# Patient Record
Sex: Female | Born: 1950 | Race: White | Hispanic: No | Smoking: Current every day smoker
Health system: Southern US, Community
[De-identification: ages and names within clinical notes are randomized; demographics above are authoritative.]

## PROBLEM LIST (undated history)

## (undated) DIAGNOSIS — R7303 Prediabetes: Secondary | ICD-10-CM

## (undated) DIAGNOSIS — T4145XA Adverse effect of unspecified anesthetic, initial encounter: Secondary | ICD-10-CM

## (undated) DIAGNOSIS — T8859XA Other complications of anesthesia, initial encounter: Secondary | ICD-10-CM

## (undated) DIAGNOSIS — Z8489 Family history of other specified conditions: Secondary | ICD-10-CM

## (undated) DIAGNOSIS — I10 Essential (primary) hypertension: Secondary | ICD-10-CM

## (undated) DIAGNOSIS — K5792 Diverticulitis of intestine, part unspecified, without perforation or abscess without bleeding: Secondary | ICD-10-CM

## (undated) DIAGNOSIS — M1990 Unspecified osteoarthritis, unspecified site: Secondary | ICD-10-CM

## (undated) DIAGNOSIS — E7800 Pure hypercholesterolemia, unspecified: Secondary | ICD-10-CM

## (undated) DIAGNOSIS — M4802 Spinal stenosis, cervical region: Secondary | ICD-10-CM

## (undated) HISTORY — PX: TUMOR REMOVAL: SHX12

---

## 1990-08-16 HISTORY — PX: BACK SURGERY: SHX140

## 1990-08-16 HISTORY — PX: THORACIC FUSION: SHX1062

## 1993-08-16 HISTORY — PX: ABDOMINAL HYSTERECTOMY: SHX81

## 1998-10-29 ENCOUNTER — Other Ambulatory Visit: Admission: RE | Admit: 1998-10-29 | Discharge: 1998-10-29 | Payer: Self-pay | Admitting: Obstetrics and Gynecology

## 1999-11-25 ENCOUNTER — Other Ambulatory Visit: Admission: RE | Admit: 1999-11-25 | Discharge: 1999-11-25 | Payer: Self-pay | Admitting: Obstetrics and Gynecology

## 2000-02-16 ENCOUNTER — Encounter: Admission: RE | Admit: 2000-02-16 | Discharge: 2000-02-16 | Payer: Self-pay | Admitting: Family Medicine

## 2000-02-16 ENCOUNTER — Encounter: Payer: Self-pay | Admitting: Family Medicine

## 2000-06-08 ENCOUNTER — Encounter (INDEPENDENT_AMBULATORY_CARE_PROVIDER_SITE_OTHER): Payer: Self-pay | Admitting: *Deleted

## 2000-06-08 ENCOUNTER — Emergency Department (HOSPITAL_COMMUNITY): Admission: EM | Admit: 2000-06-08 | Discharge: 2000-06-08 | Payer: Self-pay | Admitting: Emergency Medicine

## 2000-06-08 ENCOUNTER — Encounter: Payer: Self-pay | Admitting: Emergency Medicine

## 2000-06-08 ENCOUNTER — Encounter: Payer: Self-pay | Admitting: General Surgery

## 2000-06-08 ENCOUNTER — Inpatient Hospital Stay (HOSPITAL_COMMUNITY): Admission: EM | Admit: 2000-06-08 | Discharge: 2000-06-11 | Payer: Self-pay | Admitting: Emergency Medicine

## 2000-06-20 ENCOUNTER — Ambulatory Visit (HOSPITAL_COMMUNITY): Admission: RE | Admit: 2000-06-20 | Discharge: 2000-06-20 | Payer: Self-pay | Admitting: General Surgery

## 2000-06-20 ENCOUNTER — Encounter: Payer: Self-pay | Admitting: General Surgery

## 2000-06-20 ENCOUNTER — Encounter (INDEPENDENT_AMBULATORY_CARE_PROVIDER_SITE_OTHER): Payer: Self-pay | Admitting: *Deleted

## 2000-07-06 ENCOUNTER — Encounter (INDEPENDENT_AMBULATORY_CARE_PROVIDER_SITE_OTHER): Payer: Self-pay | Admitting: Specialist

## 2000-07-06 ENCOUNTER — Inpatient Hospital Stay (HOSPITAL_COMMUNITY): Admission: RE | Admit: 2000-07-06 | Discharge: 2000-07-14 | Payer: Self-pay | Admitting: General Surgery

## 2000-07-28 ENCOUNTER — Encounter: Admission: RE | Admit: 2000-07-28 | Discharge: 2000-10-26 | Payer: Self-pay | Admitting: Radiation Oncology

## 2000-10-26 ENCOUNTER — Encounter: Admission: RE | Admit: 2000-10-26 | Discharge: 2000-10-26 | Payer: Self-pay | Admitting: Obstetrics and Gynecology

## 2000-10-26 ENCOUNTER — Encounter: Payer: Self-pay | Admitting: Obstetrics and Gynecology

## 2001-06-14 ENCOUNTER — Emergency Department (HOSPITAL_COMMUNITY): Admission: EM | Admit: 2001-06-14 | Discharge: 2001-06-14 | Payer: Self-pay | Admitting: Emergency Medicine

## 2001-06-14 ENCOUNTER — Encounter: Payer: Self-pay | Admitting: Emergency Medicine

## 2001-06-15 ENCOUNTER — Encounter (INDEPENDENT_AMBULATORY_CARE_PROVIDER_SITE_OTHER): Payer: Self-pay | Admitting: Specialist

## 2001-06-15 ENCOUNTER — Inpatient Hospital Stay (HOSPITAL_COMMUNITY): Admission: EM | Admit: 2001-06-15 | Discharge: 2001-06-21 | Payer: Self-pay | Admitting: Family Medicine

## 2001-06-15 ENCOUNTER — Encounter: Payer: Self-pay | Admitting: Family Medicine

## 2001-08-04 ENCOUNTER — Ambulatory Visit (HOSPITAL_COMMUNITY): Admission: RE | Admit: 2001-08-04 | Discharge: 2001-08-04 | Payer: Self-pay | Admitting: *Deleted

## 2001-10-13 ENCOUNTER — Other Ambulatory Visit: Admission: RE | Admit: 2001-10-13 | Discharge: 2001-10-13 | Payer: Self-pay | Admitting: Obstetrics and Gynecology

## 2002-08-16 HISTORY — PX: COLECTOMY: SHX59

## 2003-01-10 ENCOUNTER — Encounter: Admission: RE | Admit: 2003-01-10 | Discharge: 2003-01-10 | Payer: Self-pay

## 2003-05-02 ENCOUNTER — Emergency Department (HOSPITAL_COMMUNITY): Admission: EM | Admit: 2003-05-02 | Discharge: 2003-05-03 | Payer: Self-pay | Admitting: Emergency Medicine

## 2003-05-03 ENCOUNTER — Inpatient Hospital Stay (HOSPITAL_COMMUNITY): Admission: EM | Admit: 2003-05-03 | Discharge: 2003-05-05 | Payer: Self-pay | Admitting: Family Medicine

## 2003-05-03 ENCOUNTER — Encounter: Payer: Self-pay | Admitting: Emergency Medicine

## 2003-06-03 ENCOUNTER — Inpatient Hospital Stay (HOSPITAL_COMMUNITY): Admission: EM | Admit: 2003-06-03 | Discharge: 2003-06-20 | Payer: Self-pay | Admitting: Emergency Medicine

## 2003-06-03 ENCOUNTER — Encounter: Payer: Self-pay | Admitting: Hematology and Oncology

## 2003-06-05 ENCOUNTER — Encounter: Payer: Self-pay | Admitting: Hematology and Oncology

## 2003-06-06 ENCOUNTER — Encounter: Payer: Self-pay | Admitting: Hematology and Oncology

## 2003-06-06 ENCOUNTER — Encounter (INDEPENDENT_AMBULATORY_CARE_PROVIDER_SITE_OTHER): Payer: Self-pay | Admitting: Specialist

## 2003-06-25 ENCOUNTER — Ambulatory Visit (HOSPITAL_COMMUNITY): Admission: RE | Admit: 2003-06-25 | Discharge: 2003-06-25 | Payer: Self-pay | Admitting: General Surgery

## 2003-07-24 ENCOUNTER — Encounter: Admission: RE | Admit: 2003-07-24 | Discharge: 2003-07-24 | Payer: Self-pay | Admitting: Infectious Diseases

## 2003-07-31 ENCOUNTER — Encounter: Admission: RE | Admit: 2003-07-31 | Discharge: 2003-07-31 | Payer: Self-pay | Admitting: Infectious Diseases

## 2003-08-01 ENCOUNTER — Encounter: Admission: RE | Admit: 2003-08-01 | Discharge: 2003-08-01 | Payer: Self-pay | Admitting: Infectious Diseases

## 2003-09-26 ENCOUNTER — Encounter: Admission: RE | Admit: 2003-09-26 | Discharge: 2003-09-26 | Payer: Self-pay | Admitting: Family Medicine

## 2003-10-19 ENCOUNTER — Encounter: Admission: RE | Admit: 2003-10-19 | Discharge: 2003-10-19 | Payer: Self-pay | Admitting: Family Medicine

## 2005-05-05 ENCOUNTER — Encounter: Admission: RE | Admit: 2005-05-05 | Discharge: 2005-05-05 | Payer: Self-pay | Admitting: Family Medicine

## 2005-05-17 ENCOUNTER — Encounter: Admission: RE | Admit: 2005-05-17 | Discharge: 2005-05-17 | Payer: Self-pay | Admitting: Family Medicine

## 2005-05-27 ENCOUNTER — Encounter: Admission: RE | Admit: 2005-05-27 | Discharge: 2005-05-27 | Payer: Self-pay | Admitting: Family Medicine

## 2005-11-19 ENCOUNTER — Emergency Department (HOSPITAL_COMMUNITY): Admission: EM | Admit: 2005-11-19 | Discharge: 2005-11-19 | Payer: Self-pay | Admitting: Emergency Medicine

## 2005-12-03 ENCOUNTER — Emergency Department (HOSPITAL_COMMUNITY): Admission: EM | Admit: 2005-12-03 | Discharge: 2005-12-03 | Payer: Self-pay | Admitting: Emergency Medicine

## 2006-06-07 ENCOUNTER — Encounter: Admission: RE | Admit: 2006-06-07 | Discharge: 2006-06-07 | Payer: Self-pay | Admitting: Family Medicine

## 2006-10-31 ENCOUNTER — Encounter: Admission: RE | Admit: 2006-10-31 | Discharge: 2006-10-31 | Payer: Self-pay | Admitting: Neurosurgery

## 2007-09-04 ENCOUNTER — Encounter: Admission: RE | Admit: 2007-09-04 | Discharge: 2007-09-04 | Payer: Self-pay | Admitting: Gastroenterology

## 2008-06-11 ENCOUNTER — Encounter: Admission: RE | Admit: 2008-06-11 | Discharge: 2008-06-11 | Payer: Self-pay | Admitting: Family Medicine

## 2010-09-05 ENCOUNTER — Encounter: Payer: Self-pay | Admitting: Family Medicine

## 2010-09-05 ENCOUNTER — Encounter: Payer: Self-pay | Admitting: General Surgery

## 2010-09-06 ENCOUNTER — Encounter: Payer: Self-pay | Admitting: Family Medicine

## 2011-01-01 NOTE — H&P (Signed)
[place]  Patient:    [name], [name]                      MRN: [id number] Adm. Date:  [id number] Disc. Date: [id number] Attending:  Benny Lennert                         History and Physical  CHIEF COMPLAINT: [name] is a 60 year old white female who for the last two weeks has been having a burning sensation throughout her body.  HISTORY OF PRESENT ILLNESS: She reports intermittently having some abdominal cramps but currently has absolutely no abdominal pain.  The burning sensation she feels she is not able to describe.  Whenever she has taken her temperature it has been normal.  She denies any nausea or vomiting.  She says her appetite is poor although she has not really lost any weight recently.  Her bowels have moved normally.  She denies any diarrhea or dysuria, constipation.  She overall only really complains of burning sensation in her blood.  She also says that this has been going on for 20 years and that she has a raging infection in her abdomen.  REVIEW OF SYSTEMS: Her other Review Of Systems is fairly unremarkable.  PAST MEDICAL HISTORY: Fairly unremarkable.  PAST SURGICAL HISTORY: Hysterectomy and bilateral salpingo-oophorectomy for what she calls infection.  CURRENT MEDICATIONS: Premarin 0.625 mg q.d.  ALLERGIES: No known drug allergies.  FAMILY HISTORY: Noncontributory.  SOCIAL HISTORY: She smokes about a packs of cigarettes a day and denies any alcohol use.  She does live here in [place], [place].  PHYSICAL EXAMINATION:  GENERAL: She is an obese white female, intermittently moaning and intermittently lying comfortably in bed.  SKIN: Warm and dry.  HEENT: EOMI.  PERRL.  Sclerae nonicteric.  NECK: No bruits.  LUNGS: Clear to auscultation bilaterally.  HEART: Regular rate and rhythm.  ABDOMEN: Soft, nontender, nondistended.  She is obese.  EXTREMITIES: No clubbing, cyanosis, or edema.  NEUROLOGIC:  Alert and oriented x 4.  Hematologically I can palpate no lymphadenopathy.  LABORATORY DATA: On recent laboratory work her WBC was noted to be 17,000 with a normal Differential.  The rest of her blood work was within normal limits.  Recent CT scan obtained shows an indistinct mass-type area which is difficult to tell whether it is in her mesentery of her small bowel or whether it involves her small bowel or whether it involves her colon.  Contrast made it through her colon and none of her bowel loops appear to be distended or obstructed in any way.  There is no free fluid in the abdomen and no evidence of perforation.  ASSESSMENT/PLAN: This patient is a 60 year old white female with vague symptoms of burning within her, who has an abnormality on her CT scan which is difficult to tell whether involves her small bowel or colon, but no evidence of perforation.  We will admit her to the hospital and repeat her blood work. We will also check her urine for 5-HIAA levels to try to help distinguish whether this mass if a carcinoid.  We will also check a TSH level on her.  We will also consult gastroenterology for possible colonoscopy to see if this area is involving her colon. DD:  06/08/00 TD:  06/09/00 Job: [id number]

## 2011-01-01 NOTE — Discharge Summary (Signed)
[name], [name]                         ACCOUNT NO.:  [id number]   MEDICAL RECORD NO.:  [id number]                   PATIENT TYPE:  INP   LOCATION:  5019                                 FACILITY:  [facility]   PHYSICIAN:  Jimmye Norman III, M.D.               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   ADDITIONAL DIAGNOSIS:  Subdiaphragmatic abscess, which was percutaneously  drained and the patient was sent home with the drain in place.   PRINCIPAL PROCEDURE:  Distal pancreatectomy along with a left colectomy  because of adherent pancreatic tissue, fibrosis, and scarring.   SURGEON:  Jimmye Norman, M.D.   ASSISTANT:  Anselm Pancoast. Zachery Dakins, M.D.   DISCHARGE MEDICATIONS:  1. Vicodin 1-2 to take as needed for pain.  2. Ciprofloxacin.  3. Flagyl to control further infection.   HOSPITAL COURSE:  The patient was admitted through the ED with a diagnosis  of acute diverticulitis based on previous studies and also a CT scan, which,  however, did not show the severe inflammation as noted previously. She had  been plagued with abdominal pain and was being worked up as an outpatient  for a colectomy by a partner in our group. We admitted the patient and  agreed to go ahead with surgery for diverticulitis, but instead found that  the patient had a peripancreatic inflammatory mass associated with the tail  of the pancreas and the left transverse colon and splenic flexure. She was  able to undergo a left colectomy combined with a distal pancreatectomy with  the drain being left in place. In spite of that, the patient did develop a  subdiaphragmatic abscess, which was drained percutaneously and she went  home. She was  controlled with p.o. antibiotics and although she had some  discomfort with deep inspiration she was able to be discharged to home. She  was kept on Cipro and Flagyl to be kept until after her drain was removed  and cleared by CT scan.                                                Kathrin Ruddy, M.D.    JW/MEDQ  D:  08/21/2003  T:  08/22/2003  Job:  [id number]

## 2011-01-01 NOTE — Consult Note (Signed)
[name], [name]                         ACCOUNT NO.:  [id number]   MEDICAL RECORD NO.:  [id number]                   PATIENT TYPE:  INP   LOCATION:  0476                                 FACILITY:  [facility]   PHYSICIAN:  Currie Paris, M.D.           DATE OF BIRTH:  02-23-1951   DATE OF CONSULTATION:  05/03/2003  DATE OF DISCHARGE:                                   CONSULTATION   REASON FOR CONSULTATION:  Diverticulitis.   CLINICAL HISTORY:  This patient is a 60 year old lady admitted with a  clinical diagnosis of acute diverticulitis.   Her history really dates back to 2001 when she presented with a mass in the  abdomen, was operated on, and that was found to be a mesenteric mass  associated with SMA and the majority of the mass has been resected along  with some of her proximal jejunum.  The pathology showed a mesenteric  fibromatosis.   She was subsequently admitted in October 2002 with an episode of  diverticulitis which responded to antibiotic therapy.  She said she had a  similar episode of abdominal problems in 2003 but was not hospitalized.  She  developed another episode this past August, was treated as an outpatient  with antibiotics, got better, went off the antibiotics.  About three days  ago she developed again some abdominal discomfort.  She does not really say  that this is pain.  Says she does not really have pain but just feels  nauseated and has a funny feeling in her abdomen and discomfort in the left  lower part of the abdomen.  She also has some mild nausea.   The patient does note that ever since her initial surgery in 2001 she has  had different feelings in her abdomen and really has never felt quite  normal.  CT done a few weeks ago apparently.  I do not have that report.  It  did show diverticulitis, but one done last night in the emergency room  showed some thickening of the sigmoid colon.  Because she has continued  symptomatic today she was  admitted.   PAST MEDICAL HISTORY:  She has also had a hysterectomy in addition to the  prior fibromatosis.  She has had a known history of hypertension,  hyperlipidemia.  She has had some peptic ulcer disease and reflux, on  Protonix.  She has had colon polyps noted as well, and actually had a follow-  up appointment with Dr. Anselmo Rod next week.  She has also been  diagnosed with a general anxiety disorder.  She does smoke about a pack a  day.  Does not drink alcohol.   FAMILY HISTORY:  Positive for some coronary artery disease.   PHYSICAL EXAM:  GENERAL:  Alert and oriented and uncomfortable-appearing.  VITAL SIGNS:  Stable since admission, and she has had no fever.  HEENT:  Head is normocephalic.  Eyes nonicteric.  Pupils equal, round, and  regular.  Pharynx, mucous membranes are moist.  NECK:  Supple.  No masses or thyromegaly.  LUNGS:  Clear to auscultation.  No rales or rhonchi noted.  HEART:  Regular rhythm.  No murmurs, rubs, or gallops.  Good pulses carotid,  dorsalis pedis.  ABDOMEN:  Long midline scar well-healed.  No hernias obvious.  Mildly tender  deep to the left lower quadrant.  No rebound.  No masses.  No organomegaly.  Bowel sounds are present.  EXTREMITIES:  No cyanosis or edema.   LABORATORY DATA:  Data reviewed.  Laboratories showed hemoglobin 15.8, white  count of 11,000.  There is no significant left shift.  Electrolytes are  normal.  Nonfasting glucose is 101.  Total protein is slightly high at 8.6.  Urinalysis is basically negative with specific gravity _________.   CT noted, and there is no evidence of any recurrent abdominal mass.  She  does have thickening of the sigmoid colon.   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   PLAN:  The patient is already on IV antibiotics which I think is  appropriate.  Assuming this resolves at some point, elective sigmoid  colectomy will need to be discussed.  She may need  further GI workup by Dr.  Elsie Amis prior to that being done and possibly barium enema to assess the  extent of diverticular disease and to determine if this is diverticulitis  and not some other inflammatory colonic process.                                               Currie Paris, M.D.    CJS/MEDQ  D:  05/03/2003  T:  05/04/2003  Job:  [id number]

## 2011-01-01 NOTE — Consult Note (Signed)
NAMESWEETIE, GIEBLER                         ACCOUNT NO.:  [id number]   MEDICAL RECORD NO.:  [id number]                   PATIENT TYPE:  INP   LOCATION:  5019                                 FACILITY:  [facility]   PHYSICIAN:  Jimmye Norman III, M.D.               DATE OF BIRTH:  [birth year]   DATE OF CONSULTATION:  06/03/2003  DATE OF DISCHARGE:                                   CONSULTATION   Dear Dr. Mal [name],   Thank you very much for asking me to see [name], a patient previously  followed by my partner in surgery, Dr. Chevis [name], for diverticular disease,  who has had multiple admissions in the last month for abdominal pain.  She  is being admitted for recurrent abdominal pain now and apparent recurrent  diverticulitis.   The patient has had hospitalizations at both hospitals recently for this  abdominal pain with a CT scan demonstrating the presence of likely acute  diverticulitis.  She has an interesting past history of a small bowel  mesenteric tumor which Dr. Carolynne [name] has resected in the past.  However this  current process has been on and off for the past month.  She has had  multiple admissions and now she is coming in with an increased white count,  four days of abdominal pain after failing oral antibiotics for her problem.  She has had nausea and vomiting and apparent fevers and chills with  intermittent pain.   PAST MEDICAL HISTORY:  1. Significant for the previous small bowel mesenteric tumor removed by Dr.     Carolynne [name] in 2001.  2. She has had a total abdominal hysterectomy.  3. She had back surgery in 1992 and a fusion.   CURRENT MEDICATIONS:  1. Xanax.  2. Hydrochlorothiazide.  3. Protonix.  4. Cipro and Flagyl which she was taking for diverticulitis.   ALLERGIES:  She is intolerant to CODEINE although it is not a significant  allergy (she does not take it very well).   SOCIAL HISTORY:  She is a smoker of a pack a day for over 30 years.  She is  married and has a  son. Her sister is with her at the hospital.   REVIEW OF SYSTEMS:  She has a history of anxiety.  Her bowel movements have  been mushy but not bloody or diarrhea frankly.  She has had nausea but no  vomiting recently.  No chest pain or cardiac discomfort.   PHYSICAL EXAMINATION:  GENERAL:  On examination in the emergency room she  was lying on her left side when I first saw the patient, just saying that  she was very tired of the recurrent admission for this problem.  VITAL SIGNS:  She was afebrile at 98.  Blood pressure 142/86, pulse was 72,  respirations 20 and she had good O2 saturations.  ABDOMEN:  I focused my examination on her abdomen.  She had a well-healed  midline scar from almost the xiphoid process all the way down.  She had  normal active bowel sounds.  She had no diffuse tenderness or peritonitis.  She did have some tenderness in the left lower quadrant but no palpable  mass.  RECTAL:  Exam did not demonstrate any blood. She had guarding over on that  side.   A CT scan is being planned as we speak.  Based on these findings which I do  not expect to show any acute surgical process, we will go ahead and schedule  surgery.   PLAN:  Will go ahead and admit the patient, start her on intravenous  antibiotic for her diverticulitis and then hopefully go ahead and do her  colectomy with primary anastomosis after a bowel prep in the hospital during  this hospitalization.  I attempted to contact Dr. Carolynne [name] about this patient,  however, he is out of town at this time.                                                   [name], M.D.    JW/MEDQ  D:  06/04/2003  T:  06/04/2003  Job:  [id number]

## 2011-01-01 NOTE — Discharge Summary (Signed)
[facility]  Patient:    [name], [name]                 MRN: [id number] Adm. Date:  [id number] Disc. Date: [id number] Attending:  Caleen Essex                           Discharge Summary  HOSPITAL COURSE:    [name] is a 60 year old white female who presented with an intra-abdominal mass.  She was brought to the operating room on November 21 and explored.  She was found to have a mass in her small-bowel mesentery intimately associated with her SMA.  The majority of this mass was resected, but the totality of the mass could not be removed due to its proximity.  A small portion of her proximal jejunum was resected with primary reanastomosis, and a feeding jejunostomy tube was placed.  She did well after surgery.  Her postoperative course was unremarkable.  On November 29, she was tolerating diet, ambulating without difficulty.  Her bowels were moving, and she was ready for discharge home.  FINAL DIAGNOSIS:  Mesenteric fibromatosis.  CONDITION UPON DISCHARGE:  Stable.  DISCHARGE MEDICATIONS:  She is to resume her home medications, and she will be given Vicodin 1 to 2 p.o. q.4-6h. p.r.n. for pain.  DIET:  As tolerated.  ACTIVITY:  No heavy lifting.  DISPOSITION:  She is discharged to home. DD:  08/11/00 TD:  08/11/00 Job: [id number]

## 2011-01-01 NOTE — Procedures (Signed)
[place]. [facility]  Patient:    [name], [name]                      MRN: [id number] Proc. Date: 06/10/00 Adm. Date:  [id number] Attending:  Caleen Essex CC:         Chevis Pretty, M.D.   Procedure Report  PROCEDURE:  Upper and lower endoscopy.  INDICATIONS FOR PROCEDURE:  This 60 year old white female was admitted by Dr. Carolynne Edouard for abnormal x-ray of the abdomen showing possible mass in the abdominal cavity, not quite defined as to whether it is in the GI tract, small bowel collum, or in the mesentery.  She has been complaining of epigastric pain, but there has been no weight loss or any occult bleeding.  She has had a somewhat increased eosinophil count.  She is a smoker, and has insisted on having possible recurring Staph infections, but no documentation of this was made. She is now undergoing upper and lower endoscopy for epigastric pain, as well as of the GI lesions.  ENDOSCOPE:  ______.  SEDATION: 1. Versed 10 mg IV. 2. Phentanyl 100 mcg IV.  FINDINGS:  ______ passed through posterior pharynx into esophagus.  The patient was monitored by pulse oximeter.  Oxygen saturations were 97 to 98%. Proximal, mid, and distal esophageal mucosa was unremarkable.  There was no significant hiatal hernia.  There was no stricture.  The stomach was insufflated and showed a large amount of bilious material pooled down on the greater curvature of the stomach.  There was also an intense erythema in the gastric antrum, most likely related to the alkaline reflux.  There were no mucosal lesions, no deformity of the gastric antrum, and pyloric outlet itself appeared normal.  Biopsies were taken from gastric antrum for CLO test.  Duodenum:  Duodenal bulb was unremarkable.  Descending duodenum and third portion of duodenum were also normal.  Multiple biopsies were taken from the small bowel because of eosinophilia to rule out a primary small bowel process which could  explain the eosinophilia.  The scope was then brought back into the stomach, was retroflexed, fundus and cardia appeared normal.  The patient tolerated the procedure well.  IMPRESSION: 1. Bowel gastritis. 2. Status post CLO test. 3. Status post small bowel biopsies.  COLONOSCOPY:  ENDOSCOPE:  ______.  SEDATION:  None.  FINDINGS:  The ______ was passed rectally into the sigmoid colon.  The patient again was monitored by pulse oximeter.  Oxygen saturations were normal.  Her prep was excellent.  Anal canal was unremarkable.  There were numerous diverticuli with spasm to the sigmoid colon, but no significant obstruction.  Splenic flexure, transverse colon, and hepatic flexure were viewed fully, and showed normal-appearing mucosa.  There were no polyps. Diverticuli were not present in the right colon.  The ascending colon and cecal pouch, as was ileocecal valve were unremarkable.  Colonoscope was retracted through the right to the left colon.  No mucosal abnormality was seen.  IMPRESSION:  Mild diverticulosis of the left colon with spasm.  PLAN:  The patients abdominal pain and the mass remains unexplained at this time.  Will await results of the biopsy.  Suggest proceeding with small-bowel follow-through to assess for primary small bowel tumor. DD:  06/10/00 TD:  06/10/00 Job: [id number]

## 2011-01-01 NOTE — Discharge Summary (Signed)
[name], [name]                         ACCOUNT NO.:  [id number]   MEDICAL RECORD NO.:  [id number]                   PATIENT TYPE:  INP   LOCATION:  0476                                 FACILITY:  [facility]   PHYSICIAN:  Alfonse Spruce, M.D.               DATE OF BIRTH:  11/21/1950   DATE OF ADMISSION:  05/03/2003  DATE OF DISCHARGE:  05/05/2003                                 DISCHARGE SUMMARY   FINAL DIAGNOSES:  1. Acute left lower quadrant pain was intractable associated with nausea and     vomiting.  2. Acute diverticulitis clinically with the thickening of the wall of the     descending and the sigmoid by the CT scan of the abdomen.  3. Diarrhea, probably secondary to the diverticulitis.  4. Leukocytosis, initially secondary to diverticulitis with failure of     outpatient antibiotic treatment with the failure of outpatient treatment.  5. Gastroesophageal reflux disease.   CONSULTATIONS:  Dr. Cyndia Bent, surgeon, seeing the patient for Dr.  Carolynne Edouard, her surgeon, _________with her history of resected mesenteric  fibromatosis.  She had established appointment with Dr. Loreta Ave of GI which she  will be seeing her next Thursday on May 10, 2003.   The patient was admitted to the hospital with the severe of the left lower  quadrant, nausea and vomiting.  The patient started n.p.o., and started on  Cipro 400 mg q.12h., IV piggyback, as well as Flagyl 500 mg IV piggyback,  and the IV fluids and discontinuation of all her medications except started  the Protonix with underlying history of GERD disease.  The patient did well  subsequently, and her white count gradually improving.  She did not exhibit  a high temperature; however, she was low-grade temperature.  She has been  subsequently stable and her blood pressure on today was 112/74, with pulse  of 72, temperature of 97.7, and she is feeling much better, able to tolerate  a clear liquid diet, and she requested to go home.   Advised to increase her  diet very slowly and gradually.  She had an appointment with Dr. Loreta Ave of  gastroenterology, and she will be seeing her on May 10, 2003, and  further investigation as recommended by the surgeon for lower endoscopy is  in order, as well as to see Dr. Carolynne Edouard, the surgeon, for future planning for  surgical intervention.  With the underlying laboratory indicating that white  count 9.2, hemoglobin 14.1, hematocrit 40.8.  Electrolytes within normal  limits.  Sodium 141, potassium 4, BUN 6, creatinine 1.  The patient will be  discharged home today in a stable general condition to be followed by Dr.  Carolynne Edouard, as well as Dr. Loreta Ave.  Dr. Loreta Ave will see her on May 10, 2003, and  Dr. Carolynne Edouard in one to two weeks subsequently.  Her PCP, Dr. Smith Mince, will be  following her.  A  copy of the pink sheet, discharge summary will be faxed  to Dr. Smith Mince to the fax number [phone].   DISCHARGE MEDICATIONS:  1. Cipro 500 mg p.o. b.i.d. for 10 days.  2. Protonix 40 mg p.o. q. daily for 10 days.  3. Flagyl 500 mg p.o. t.i.d.   ACTIVITY:  Off work until seen by Dr. Loreta Ave on May 10, 2003, which is  next Thursday.   DISPOSITION:  The patient is in stable general condition.                                                Alfonse Spruce, M.D.    [name]  D:  05/05/2003  T:  05/05/2003  Job:  [id number]   cc:   Anselmo Rod, M.D.  [address]  [address]  [place]  [place] [address]  Fax: [phone]   Ollen Gross. Vernell Morgans, M.D.  [address]  [place]  [place] [address]  Fax: [phone]   Talmadge Coventry, M.D.  [address]  [place]  [place] [address]  Fax: [phone]

## 2011-01-01 NOTE — Consult Note (Signed)
[facility]  Patient:    [name], [name] Visit Number: [id number] MRN: [id number]          Service Type: MED Location: [phone] Attending Physician:  Talmadge Coventry Dictated by:   Sharyn Dross., M.D. Proc. Date: 06/15/01 Admit Date:  06/15/2001                            Consultation Report  REFERRING PHYSICIAN:  Dr. Talmadge Coventry.  REASON FOR CONSULTATION:  Subjectively, this pleasant 60 year old white female was referred for evaluation of abdominal discomforts with nausea and vomiting and diverticulosis.  The patient has a history and has been diagnosed with diverticulosis since her surgery in 1991.  She has been relatively stable from this problem until approximately two weeks ago.  She has been having abdominal discomforts with nausea and vomiting which subsequently have occurred.  This persisted for an approximately one weeks period of time.  Because of previous illness, the patient was referred for a CT scan of her abdomen.  The CT scan was suggestive of diverticulitis that was present in the rectosigmoid area as well as some abnormal thickening of the antrum/duodenal region at that point. The patient was seen by her primary physician subsequent to that and was started with oral antibiotics.  During the next week, she states that her nausea discomforts did not improve and her spouse had mentioned that she was going from fevers and chills alternately during that time.  She presented to the [facility] Emergency Room, where she was treated with Flagyl and Phenergan at that time.  Her symptoms still persisted and she was seen by her primary physician, who subsequently admitted her into the hospital.  With these episodes of nausea and vomiting, she denies any history of any hematemesis or any melenic stools at this time.  She had been told that she may have had an ulcer in the past, but this may be just a relationship to the CT scan  that was given.  She states that she has only recently been told she has had diverticulitis, which was when the CT scan was also performed.  REVIEW OF SYSTEMS:  The patient has denied any history of any cardiovascular problems such as hypertension, congestive heart failure, etc.  She denies any history of any pulmonary diseases or renal diseases that were present.  She was seen a year ago by her gynecologist, who found the mesenteric mass that was present.  She had undergone an endoscopy and colonoscopy examination and the results showed no evidence of acid peptic disease or any abnormalities of the colon except rectosigmoid diverticulosis that was present.  She subsequently underwent an exploratory laparotomy and a mass lesion was found in the mesentery area at this time.  Most of the lesion was removed but a remnant portion was left after surgery.  The mass appeared to have been benign in character at this time.  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 The rest of her review of systems was negative.  PHYSICAL EXAMINATION:  GENERAL:  She is a pleasant female who appears to be depressed in character at this time.  VITAL SIGNS:  Her vital signs were stable with a temperature of 97, pulse rate 88, respiratory rate was 20, blood pressure was 143/76.  Height was 65 inches. Weight is 210 pounds.  HEENT:  Examination is positive for arcus senilis.  NECK:  Supple.  LUNGS:  Clear to auscultation and percussion with good breath sounds bilaterally.  HEART:  Regular rate and rhythm without heaves, thrills, murmurs or gallops appreciated.  ABDOMEN:  Soft.  There were discomforts but no specific tenderness to palpation that was noted; this was appreciated in the right upper quadrant area as well as in the left lower quadrant region that was noted at this time.  EXTREMITIES:  No clubbing, cyanosis, or edema.  X-RAYS:  CT of the abdomen and pelvis  shows:  #1 - no abnormal mucosal thickening of the gastric antrum and duodenal region, which may represent inflammatory changes or possible ulcer disease, #2 - diverticular disease of the distal colon with mucosal edema suggestive of intramural diverticulitis that was present.  LABORATORY DATA:  Her other labs showed a white cell count of 12.4, hemoglobin 14.1, hematocrit 40, MCV of 92, RDW 13, platelet count 362,000 at this time, 64 polys, 29 lymphs, 5 monos, 1 eos, 1 baso that were noted.  Sodium was 142, potassium 3.9, chloride 107, CO2 27, glucose 108, BUN of 9, creatinine 0.8, bilirubin 0.9, alkaline phosphatase 41, SGOT of 23, SGPT 25, total protein 8.2, albumin 4, calcium 9.4, amylase 26 and lipase 10.  Acute abdominal series that was done showed residual barium noted in a few left colon diverticula appreciated.  No evidence of ileus or bowel obstruction appreciated and chest x-ray showed no acute disease that was present.  GROSS IMPRESSION: 1. Diverticulitis as characterized by the patients abdominal pains as well as    an abnormal CT scan performed on June 08, 2001. 2. Persistent nausea and vomiting with abnormal results of the CT scan    consistent with inflammatory process.  The differential diagnosis could be    acid peptic disease involving the antrum and duodenal region that is    present at this time, considered most likely and especially in light of her    persistent nausea and vomiting present.  Inflammatory bowel disease must    also be considered in this region.  PRESENT RECOMMENDATIONS: 1. I am presently going to schedule the patient for an endoscopic examination    for the a.m. if possible and if not, then for Saturday morning; this is to    evaluate the gastric area to determine what may be the outer causes of her    symptoms. 2. I agree with the present regimen for the diverticulitis but question is    going to be how do you want to handle this from this  standpoint.  There    would be to approaches to:    a. Place the patient on a low-residue diet at this time until her        symptoms start to improve and then to advance her to a high-fiber diet       or...    b. Just advance her with the high-fiber diet.  In light of her previous       symptoms of diverticular disease, I would probably choose the first       approach with the low-residue diet to decrease the amount of stool       material present there and to allow adequate healing with the       antibiotics at this time. 3. Would consider the possibilities of an antidepressant for this patient,    based on her overall condition, but I will leave that to your decision from    this standpoint.  Thank you much  for this consultation.  I will follow the patient with you. Dictated by:   Sharyn Dross., M.D. Attending Physician:  Talmadge Coventry DD:  06/15/01 TD:  06/16/01 Job: [id number]

## 2011-01-01 NOTE — Op Note (Signed)
[name], [name]                         ACCOUNT NO.:  [id number]   MEDICAL RECORD NO.:  [id number]                   PATIENT TYPE:  INP   LOCATION:  5019                                 FACILITY:  [facility]   PHYSICIAN:  Jimmye Norman III, M.D.               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   POSTOPERATIVE DIAGNOSIS:  Possible splenic flexure diverticulitis with  sigmoid diverticulitis and perisplenic and peripancreatic mass.   PROCEDURE:  Left hemicolectomy with distal pancreatectomy.   SURGEON:  Jimmye Norman, M.D.   ASSISTANT:  Anselm Pancoast. Zachery Dakins, M.D.   ANESTHESIA:  General endotracheal anesthesia.   ESTIMATED BLOOD LOSS:  750 mL.   COMPLICATIONS:  None, condition stable.   DRAINS:  A 19-mm Blake drain was left in the left upper quadrant.   INDICATIONS FOR PROCEDURE:  The patient is a 60 year old who had multiple  recurrent admissions for presumed acute diverticulitis with a CT scan  confirming the diagnosis who now comes in with recurrent symptoms.   FINDINGS:  The patient had boggy, thickened sigmoid colon in the proximal  mid and distal descending and distal sigmoid colon. Along the splenic  flexure there was a firm mass which appeared  to be originating from the  colon, but on further  inspection and dissection, it turned out to be a  peripancreatic mass which may have originated from the pancreas or may have  originated from the splenic flexure of the colon. This was resected en bloc  with the splenic flexure down to the left transverse colon. The gallbladder  was soft and had no evidence of stones. There were no hepatic lesions. The  spleen was normal, and even after resecting the mass appeared to be viable.   OPERATION:  The patient was taken to the operating room and placed on  the  table in the supine position. After an adequate endotracheal anesthetic was  administered she was prepped and draped in the usual sterile manner exposing  the midline of the abdomen.   The initial incision was made just to the left of the umbilicus  and down to  the pubic crest. However, upon palpating the lesion  of the splenic flexure  after we got into the peritoneal cavity, we extended it to the upper  portion of the abdomen. We took it down to and through the midline fascia in  the lower portion of the incision, and once we were in the peritoneal cavity  we were able to take down omental adhesions to the anterior fascia near the  midline.   We palpated the sigmoid colon with the patient in the Trendelenburg position  and found it to be boggy in the mid to distal  portion of the  sigmoid. We  dissected out the colon at the line of Toldt and identified the left ureter.  We dissected the left colon off the retroperitoneum at the line of Toldt  superiorly, and as we were palpating up near the splenic flexure felt there  to be a mass involving the splenic flexure. At that point we had extended  our excision and placed an omni retractor in place, placed the patient in  the reverse Trendelenburg position and her left side was tilted towards the  right.   We dissected out the splenic flexure on the left side, dissecting it away  from the spleen using hema clips, large hema clips, medium hema clips and 2-  0 silk ties. One large short gastric vessel had to be ligated between a  right-angle clamp and a 2-0 silk tie for control of bleeding The spleen was  left intact, however, the distal  pancreas which appeared  to be attached to  this mass which we dissected off of the colon appeared  to be attached to  this mass, therefore, we used a TA-55 stapler to staple off the distal  pancreas which was attached to the colon. We sent this for a specimen and it  showed what appeared  to be normal  pancreatic tissue. A 19-mm Blake drain  was left in that area postoperatively in order to control any possible  leakage from the pancreas.   We extended our dissection of the left colon to sort of the left side of the  transverse colon, but we came across it with a GIA 75 stapler. We then  dissected it away from the omentum and then used Kelly clamps and 2-0 silk  ties to ligate the mesentery all the way down to the distal  sigmoid colon  where the superior  hemorrhoidal vessel was clamped as it extended to the  distal sigmoid colon and ligated. We stapled the distal sigmoid colon using  a reticulating TA-55 stapler with 3.5-mm staples. Then we subsequently came  across the last part of the mesentery and sent the specimen off which  included  the mass at the splenic flexure.   We dissected out enough  of the colon and its mesentery in order to bring it  down the left pericolic area and perform a hand-sewn anastomosis between  the proximal and distal  colon. We used 3-0 silk [place] stitches for the  external surface and a 3-0 Vicryl canal stitch was placed for full thickness  mucosa-to-mucosa apposition. Once we had  done the complete anastomosis, we  tacked down the reconstructed colon to the left pericolic area, taking care  not to involve the ureter.   Once it was tacked down we irrigated with warm saline solution, checked the  splenic flexure for any evidence of bleeding. There was no bleeding from the  spleen itself or from the peripancreatic area. We left the [place] drain in  place and secured it with a 3-0 nylon. Once this was done and we had  palpated the NG tube to be in adequate  position, we closed the abdomen  using a running #1 PDS suture  with interrupted figure-of-8  stitches of #1  Vicryls for internal retentions. All sponge, instrument and needle counts  were correct.  Kathrin Ruddy, M.D.   JW/MEDQ  D:  06/06/2003  T:   06/06/2003  Job:  [id number]

## 2011-01-01 NOTE — Discharge Summary (Signed)
[facility]  Patient:    [name], [name] Visit Number: [id number] MRN: [id number]          Service Type: END Location: ENDO Attending Physician:  Sharyn Dross Dictated by:   Talmadge Coventry, M.D. Admit Date:  08/04/2001 Discharge Date: 08/04/2001                             Discharge Summary  DATE OF BIRTH:  [birth year]  CONSULTATIONS:  Dr. Tad Moore of GI.  PROCEDURES:  Upper endoscopy.  DISCHARGE DIAGNOSES: 1. Acute diverticulitis. 2. Nausea and vomiting. 3. Esophagitis. 4. Hyperlipidemia. 5. Past history of mesenteric fibromatosis, removed by Dr. Carolynne Edouard in November    2001. 6. Anxiety.  HISTORY OF PRESENT ILLNESS:  [name] is a 60 year old female who had a history of diverticulosis since 1991.  Last year, she had extensive GI and GYN evaluation, including endoscopy, colonoscopy, exploratory laparotomy which found a mesenteric fibromatosis as the mass.  Most of it was removed.  The patient gradually improved in her discomfort and feeling of periodic diaphoresis which she felt was her main symptom with the fibromatosis, probably an autonomic effect.  Several days prior to admission she developed nausea, left lower quadrant abdominal pain, and epigastric pain.  A CT of the abdomen and pelvis showed abnormal mucosal thickening of the gastric antrum and duodenum suggestive of inflammation or ulcer.  Diverticular disease with mucosal edema suggestive of intramural diverticulitis, no recurrence of fibromatosis.  The patient was treated as an outpatient for presumed diverticulitis, but she returned the next day with worse nausea and inability to keep medications down.  PHYSICAL EXAMINATION:  GENERAL:  On admission, she was anxious appearing.  VITAL SIGNS:  Blood pressure 150/92, pulse 72, afebrile.  ABDOMEN:  Diffuse abdominal tenderness, greatest in the right upper quadrant, no rebound.  LUNGS:  Clear.  HEART:  Rapid regular rhythm.   Slightly orthostatic.  EXTREMITIES:  No edema.  RECTAL:  Stool guaiac negative.  LABORATORY DATA:  Admitting hemoglobin 14.1, white cells 12.7, platelets 362. Sodium 142, potassium 3.9.  Liver function tests, amylase and lipase okay.  An acute abdominal series showed no evidence of obstruction.  ADMITTING IMPRESSION:  Acute diverticulitis, possibly gastritis.  HOSPITAL COURSE:  The patient was treated with Prevacid for the later.  Flagyl and Cipro IV for the former.  She was seen in consultation by Dr. Tad Moore, who felt that an endoscopy was warranted because of her nausea.  This was done on 06/16/01, and the patient was found to have changes of chronic gastritis.  She was treated with IV antibiotics for four days, and p.o. Prevacid was changed to IV Protonix.  Pain and nausea gradually improved.  However, on 06/19/01, when she was switched to p.o. antibiotics she developed extreme nausea once again.  Her white count was 13.9.  At that point, I felt that antibiotics may be causing her nausea. These were stopped, switched to Levaquin and Augmentin, and Reglan was started for nausea.  By the following day, she was much better as far as the nausea was concerned, and the decision was made to discharge her home.  Final white count was 14.5, final hemoglobin 14.1.  DISCHARGE MEDICATIONS: 1. Levaquin 500 mg q.d. 2. Augmentin 875 mg one b.i.d. 3. Protonix one q.d. 4. Reglan a.c. and q.h.s. p.r.n. nausea. 5. Lexipro 10 mg q.d. 6. Xanax p.r.n.  DIET:  Low residue.  FOLLOWUP:  Appointment was with Dr.  McKie on 06/28/01, and Dr. Smith Mince on 06/29/01. Dictated by:   Talmadge Coventry, M.D. Attending Physician:  Sharyn Dross DD:  08/15/01 TD:  08/16/01 Job: [id number]

## 2011-01-01 NOTE — Discharge Summary (Signed)
[place]  Patient:    [name], [name]                      MRN: [id number] Adm. Date:  [id number] Disc. Date: [id number] Attending:  Caleen Essex                           Discharge Summary  HISTORY OF PRESENT ILLNESS:  Ms. Fahringer is a 60 year old white female who presented with burning sensation throughout her whole body.  She was found to have an abnormal mass in her abdomen.  She came with a CT scan and was worked up with blood work, upper endoscopy, lower endoscopy, all of which were negative.  Her symptoms improved while in the hospital, and she was discharged to home on October 27 with followup to the clinic.  CONDITION UPON DISCHARGE:  Stable.  DISCHARGE MEDICATIONS:  She was to continue her home medications.  ACTIVITY:  No restrictions.  DIET:  Regular. DD:  07/01/00 TD:  07/02/00 Job: [id number]

## 2011-01-01 NOTE — Op Note (Signed)
[facility]  Patient:    [name], [name]                 MRN: [id number] Proc. Date: 07/06/00 Adm. Date:  [id number] Attending:  Caleen Essex                           Operative Report  PREOPERATIVE DIAGNOSIS:  Abdominal mass.  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  PROCEDURE PERFORMED:  Exploratory laparotomy, segmental small-bowel resection with duodenojejunostomy, tumor resection, and feeding jejunostomy.  SURGEON:  Chevis Pretty, M.D.  ASSISTANT:  Angelia Mould. Derrell Lolling, M.D.  ANESTHESIA:  General endotracheal.  DESCRIPTION OF PROCEDURE:  After informed consent was obtained, the patient was brought to the operating room and placed in the supine position on the operating table.  After adequate induction of general anesthesia, the patients abdomen was prepped with Betadine, and draped in the usual sterile manner.  An upper midline incision was made with the 10 blade knife.  This incision was carried down through the skin and subcutaneous tissue using the Bovie electrocautery until the fascia of the linea alba was encountered.  The fascia of the linea alba was incised also with the Bovie electrocautery, and the preperitoneal space was probed bluntly until the peritoneum was encountered.  The peritoneum was grasped with two hemostats and elevated. This area between the hemostats was then palpated to make sure there was no visceral components caught in the hemostats.  The peritoneum was then opened with the Metzenbaum scissors gaining access to the abdominal cavity.  When this was complete, the rest of the incision was opened through the peritoneum under direct vision with the Bovie electrocautery.  Several adhesions of omentum to the anterior abdominal wall were taken down sharply with the Bovie electrocautery freeing the omentum.  The omentum and transverse colon were then reflected superiorly and the proximal small bowel  was palpated.  The tumor was easily palpable in the mesentery of the proximal small bowel.  On examination of this area, the tumor was very deep in the mesentery, and appeared to be very close to the superior mesenteric artery.  Initially, a segment of small bowel was examined that corresponded to the wedge shaped involvement of the mesentery, and it was decided to divide the small segment of small bowel and try to get around the tumor from each side.  Once the segments of small bowel were chosen, the mesentery next to the wall of the small bowel was incised with the Bovie electrocautery allowing circumferential dissection of the wall of the small bowel.  When this was complete, each segment of small bowel was divided with a GIA 55 stapler.  Next, the mesentery leading down to the tumor was serially clamped, divided, and ligated with 2-0 silk ties.  An attempt was made to stay very close to the tumor so as not to injury the superior mesenteric artery in anyway.  Because of the proximity of the tumor to the superior mesenteric artery, it was not possible to completely surround and remove all of the tumor.  Once we neared the base of the tumor by serially clamping, cutting and ligating between hemostats with 2-0 silk ties. The tumor had to simply be shaved off using the Bovie electrocautery.  The tumor was in the mesentery associated with the third and fourth portions of the duodenum and the first portion of the jejunum.  One smaller  branch artery was encountered within the substance of the tumor that required ligation with a 2-0 silk suture ligature.  Accordingly, a venous structure was also encountered, intermittently associated with the substance of the tumor and required suture ligation with a 2-0 silk suture ligature.  By removing this section of the tumor, the proximal portion of the jejunum proximal to the previously stapled division was compromised as far as its blood supply  was concerned.  The third and fourth portions of the duodenum did appear to be healthy and viable.  The mesentery next to the small bowel was divided serially between hemostats and ligated with 2-0 silk ties until enough bowel had been mobilized back to the healthy segment of the third and fourth portion of the duodenum.  The bowel was then restapled and divided with a GIA 55 stapler.  The tumor specimen was sent to the pathologist for frozen section and they could not reliably identify the tumor via frozen section.  Next, the proximal jejunum was placed next to the third and fourth portions of the duodenum in a position that seemed to be the natural way the bowel wanted to lay.  Five silk sutures were then used to create a posterior wall reinforcement layer.  The fourth portion of the duodenum and the proximal segment of small bowel were then opened with the Bovie electrocautery for a distance of approximately 1.5 to 2 cm allowing for a nice size anastomosis. The two pieces of small bowel were then sewn together with two running full thickness 4-0 PDS sutures.  Each was sewn starting from the middle of the anastomosis posteriorly, each suture laterally until the corners were reached, and then sewed in a running fashion anteriorly across the anterior wall until the tissue came together and were tied to each other.  Care was taken not to pursue this anastomosis and at the end of the anastomosis, the anastomosis was palpated and found to be widely patent.  Next, an anterior layer of silk reinforcement sutures were placed and again the anastomosis was checked and was found to be widely patent.  Both ends of bowel were completely viable and healthy appearing.  The mesenteric defect was then closed with a couple of interrupted 3-0 silk sutures.  The abdomen was then irrigated with copious amounts of saline.  A segment of jejunum was then chosen distal to the anastomosis approximately 30 cm or  so down stream.  A 3-0 silk pursestring stitch was placed in the intermesenteric wall of the small bowel.  A hole was then made in the small bowel and a 12 French red rubber catheter tube was  brought percutaneously through the left abdominal wall, and was placed through this hole into the small bowel running distally.  This would serve as a feeding jejunostomy tube.  The pursestring silk suture was tied down and secured.  Next, several 3-0 silk sutures were used to create a tunnel in the wall of the bowel for the feeding tube.  The feeding tube was then anchored to the anterior abdominal wall at its exit point using four 3-0 silk sutures. The feeding tube was anchored to the skin using a 3-0 nylon stitch.  The rest liver was then palpated and no abnormalities were found.  The gallbladder was also palpated and no abnormalities were found.  The abdomen again was irrigated with copious amounts of saline.  The fascia of the abdomen was then closed with two running #1 PDS sutures.  The subcutaneous tissue  was then irrigated with saline and the skin was closed with staples.  Sterile dressings were applied.  The patient tolerated the procedure well.  At the end of the case, all needle, sponge and instrument counts were correct.  The patient was awakened and taken to the recovery room in stable condition. DD:  07/06/00 TD:  07/08/00 Job: [id number]

## 2011-01-01 NOTE — H&P (Signed)
NAME:  [name], [name]                         ACCOUNT NO.:  [id number]   MEDICAL RECORD NO.:  [id number]                   PATIENT TYPE:  INP   LOCATION:  5019                                 FACILITY:  [facility]   PHYSICIAN:  Alfonse Spruce, M.D.               DATE OF BIRTH:  1951-03-11   DATE OF ADMISSION:  06/03/2003  DATE OF DISCHARGE:                                HISTORY & PHYSICAL   The patient was sent from the office of Talmadge Coventry, M.D. to be  admitted to the hospital.   CHIEF COMPLAINT:  Abdominal pain with cramping four days ago on Thursday and  progressively worsening, associated with fever and intermittent pain. No  vomiting, however.  She had cramp-like menstrual cramps.  She denied any  eating of any popcorn or seeds.   HISTORY OF PRESENT ILLNESS:  The patient is a 60 year old white female has  been recently admitted to [facility] and treated with IV  antibiotics and seen by Dr. Russella Dar at that time with the plan to be followed  by Dr. Loreta Ave of gastroenterology as well as Dr. 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  The patient did see Dr. Loreta Ave  and did see Dr. Carolynne Edouard and she has planned to see in November Dr. Carolynne Edouard again  and November 2 followed by colonoscopy by Dr. Loreta Ave on November 7.  However,  she had acute attack of pain, went to her doctor, Dr. Smith Mince who sent the  patient to the ER for admission.  In the ER, physician refused to see the  patient and called Dr. Smith Mince again and she asked [place] to admit the patient  with failure of antibiotics as an outpatient and she has been treating the  patient with Flagyl 500 mg t.i.d. for more than 10 days and Cipro 500 mg  b.i.d. for more than 10 days with no resolution for her symptoms.  However,  the symptoms worsened in the four days prior to admission.   PAST SURGICAL HISTORY:  She had surgery in 1992, back surgery for cervical  fusion in 1995, complete  hysterectomy in 2001.  She had a tumor removed from  her small bowel, benign in nature.   MEDICATIONS:  1. Xanax.  2. Hydrochlorothiazide.  3. Protonix.  4. Cipro.  5. Flagyl.   ALLERGIES:  CODEINE and she requested no codeine, however, she has no  allergy to penicillin.  She has occasional history of rash that is not  bothering her.   SOCIAL HISTORY:  She is a smoker and smokes a pack per day for 35 years.  She is married.  She has one son.   FAMILY HISTORY:  She has one sister and no brothers.  Her parents are still  alive and her father had an MI.   REVIEW OF SYSTEMS:  NEUROPSYCHIATRY: History of anxiety. No strokes.  GASTROINTESTINAL: No pain in the lower abdomen with some mushy stools.  However, no bloody stools and no tenesmus.  She felt nauseated, but no  vomiting.  RESPIRATORY: No cough or expectoration.  She had a history of  COPD with the chronic smoking.  ENDOCRINE: No diabetes and no thyroid  disease.  CARDIOVASCULAR:  No MI and no palpitations.  NEUROLOGY: No strokes  in the past.   PHYSICAL EXAMINATION:  GENERAL: The patient is conscious, alert and oriented  x3.  No acute respiratory distress.  VITAL SIGNS: Temperature 98, blood pressure 142/86, pulse 72, respiratory  rate 22, and pulse oximetry 97.  HEENT:  NECK:  Supple, no JVD. No lymphadenopathy.  CHEST:  Increased  Diameter was on the line, current smoker.  HEART:  Compensated.  ABDOMEN:  Tender with left lower quadrant with underlying rebound tenderness  was elicited, however, is not significant.  EXTREMITIES: No edema.  Peripheral pulses.  NEUROLOGY:  Grossly intact.  Cranial nerves II-XII grossly intact.  Motor  and sensory intact.   IMPRESSION:  1. Acute diverticulitis, recurrent, with failure of outpatient treatment     with oral antibiotics and the need for IV antibiotics.  2. Chronic obstructive pulmonary disease with a chronic smoker.   Significant discussion with Dr. Smith Mince and her request  that the patient  be admitted to the hospital.  Also I spoke with Dr. Lindie Spruce who is covering  Dr. Carolynne Edouard from [facility], who stated that he will relay the  information to Dr. Carolynne Edouard tomorrow and accepted if there is any acute rupture  of the diverticula on the CAT scan and the patient's laboratory indicating  white count of 12.6 with hemoglobin 15, and hematocrit 48.3.  UA was  negative. Sodium 138, potassium 3.5, chloride 100, carbon dioxide 28, blood  sugar 105.   PLAN:  The patient is admitted to [facility] and will be NPO except  for ice chips and starting IV antibiotics, Zosyn q.6h pharmacy to dose 3.375  as well as we will continue for the Ativan IM p.r.n. and Nicotine patch with  the history of smoking.  Further treatment and planning depend on the  patient's outcome and improvement.  Will obtain CBC with differential in the  a.m. as well as BMET in the a.m.                                                Alfonse Spruce, M.D.    Wynn Maudlin  D:  06/03/2003  T:  06/03/2003  Job:  [id number]   cc:   Talmadge Coventry, M.D.  [address], [address]  [place]  [place] [address]  Fax: [phone]

## 2011-09-19 ENCOUNTER — Telehealth: Payer: Self-pay | Admitting: Gastroenterology

## 2011-09-19 NOTE — Telephone Encounter (Signed)
She thinks "the infection is not better."  It sounds as if she has h/o diverticulitis, was put on cipro/flagyl for 30 day course last week by Dr. Loreta Ave.  No fevers, + chills, no pain in her abd but says she cannot ever really feel pain in her belly.  I advised ER evaluation to check for complicated diverticulitis.  She "fears the ER," and instead said she will call Dr. Kenna Gilbert office in the AM.

## 2011-09-23 ENCOUNTER — Encounter (HOSPITAL_COMMUNITY): Payer: Self-pay | Admitting: *Deleted

## 2011-09-23 ENCOUNTER — Emergency Department (HOSPITAL_COMMUNITY)
Admission: EM | Admit: 2011-09-23 | Discharge: 2011-09-23 | Disposition: A | Discharge status: Home / Self Care | Payer: 59 | Attending: Emergency Medicine | Admitting: Emergency Medicine

## 2011-09-23 ENCOUNTER — Emergency Department (HOSPITAL_COMMUNITY): Payer: 59

## 2011-09-23 DIAGNOSIS — K5730 Diverticulosis of large intestine without perforation or abscess without bleeding: Secondary | ICD-10-CM | POA: Insufficient documentation

## 2011-09-23 DIAGNOSIS — F17200 Nicotine dependence, unspecified, uncomplicated: Secondary | ICD-10-CM | POA: Insufficient documentation

## 2011-09-23 DIAGNOSIS — D72829 Elevated white blood cell count, unspecified: Secondary | ICD-10-CM

## 2011-09-23 DIAGNOSIS — I10 Essential (primary) hypertension: Secondary | ICD-10-CM | POA: Insufficient documentation

## 2011-09-23 DIAGNOSIS — M4802 Spinal stenosis, cervical region: Secondary | ICD-10-CM | POA: Insufficient documentation

## 2011-09-23 DIAGNOSIS — R109 Unspecified abdominal pain: Secondary | ICD-10-CM

## 2011-09-23 DIAGNOSIS — R197 Diarrhea, unspecified: Secondary | ICD-10-CM | POA: Insufficient documentation

## 2011-09-23 DIAGNOSIS — E7800 Pure hypercholesterolemia, unspecified: Secondary | ICD-10-CM | POA: Insufficient documentation

## 2011-09-23 DIAGNOSIS — Z79899 Other long term (current) drug therapy: Secondary | ICD-10-CM | POA: Insufficient documentation

## 2011-09-23 HISTORY — DX: Diverticulitis of intestine, part unspecified, without perforation or abscess without bleeding: K57.92

## 2011-09-23 HISTORY — DX: Essential (primary) hypertension: I10

## 2011-09-23 HISTORY — DX: Pure hypercholesterolemia, unspecified: E78.00

## 2011-09-23 HISTORY — DX: Spinal stenosis, cervical region: M48.02

## 2011-09-23 LAB — CBC
MCH: 32.4 pg (ref 26.0–34.0)
MCV: 91.7 fL (ref 78.0–100.0)
Platelets: 293 10*3/uL (ref 150–400)
RBC: 4.72 MIL/uL (ref 3.87–5.11)
RDW: 13.8 % (ref 11.5–15.5)

## 2011-09-23 LAB — URINALYSIS, ROUTINE W REFLEX MICROSCOPIC
Nitrite: NEGATIVE
Specific Gravity, Urine: 1.022 (ref 1.005–1.030)
Urobilinogen, UA: 0.2 mg/dL (ref 0.0–1.0)

## 2011-09-23 LAB — URINE MICROSCOPIC-ADD ON

## 2011-09-23 LAB — COMPREHENSIVE METABOLIC PANEL
ALT: 28 U/L (ref 0–35)
Calcium: 9.3 mg/dL (ref 8.4–10.5)
Creatinine, Ser: 0.84 mg/dL (ref 0.50–1.10)
GFR calc Af Amer: 85 mL/min — ABNORMAL LOW (ref >90)
Glucose, Bld: 100 mg/dL — ABNORMAL HIGH (ref 70–99)
Sodium: 135 mEq/L (ref 135–145)
Total Protein: 9 g/dL — ABNORMAL HIGH (ref 6.0–8.3)

## 2011-09-23 LAB — DIFFERENTIAL
Eosinophils Absolute: 0.2 10*3/uL (ref 0.0–0.7)
Eosinophils Relative: 2 % (ref 0–5)
Lymphs Abs: 4.6 10*3/uL — ABNORMAL HIGH (ref 0.7–4.0)

## 2011-09-23 LAB — LIPASE, BLOOD: Lipase: 9 U/L — ABNORMAL LOW (ref 11–59)

## 2011-09-23 MED ORDER — METRONIDAZOLE IN NACL 5-0.79 MG/ML-% IV SOLN
500.0000 mg | Freq: Once | INTRAVENOUS | Status: AC
  Administered 2011-09-23: 500 mg via INTRAVENOUS
  Filled 2011-09-23: qty 100

## 2011-09-23 MED ORDER — ONDANSETRON HCL 4 MG/2ML IJ SOLN
4.0000 mg | Freq: Once | INTRAMUSCULAR | Status: AC
  Administered 2011-09-23: 4 mg via INTRAVENOUS
  Filled 2011-09-23: qty 2

## 2011-09-23 MED ORDER — CIPROFLOXACIN IN D5W 400 MG/200ML IV SOLN
400.0000 mg | Freq: Once | INTRAVENOUS | Status: AC
  Administered 2011-09-23: 400 mg via INTRAVENOUS
  Filled 2011-09-23: qty 200

## 2011-09-23 MED ORDER — MORPHINE SULFATE 4 MG/ML IJ SOLN
4.0000 mg | Freq: Once | INTRAMUSCULAR | Status: AC
  Administered 2011-09-23: 4 mg via INTRAVENOUS
  Filled 2011-09-23: qty 1

## 2011-09-23 MED ORDER — IOHEXOL 300 MG/ML  SOLN: 100.0000 mL | Freq: Once | INTRAMUSCULAR | Status: DC | PRN

## 2011-09-23 MED ORDER — SODIUM CHLORIDE 0.9 % IV BOLUS (SEPSIS)
1000.0000 mL | Freq: Once | INTRAVENOUS | Status: AC
  Administered 2011-09-23: 1000 mL via INTRAVENOUS

## 2011-09-23 NOTE — ED Notes (Signed)
Pt states "I've been to Dr. Kenna Gilbert office 3 times in the past 2 wks, I have a hx of intestinal tract infections, was sent here by Dr. Kenna Gilbert office"

## 2011-09-23 NOTE — ED Notes (Signed)
Pt left before receiving discharge papers and signing out

## 2011-09-23 NOTE — ED Notes (Signed)
Pt reports recently being diagnosed with diverticulitis and taking antibiotics for it. States that she has been taking abx for a week and abdominal pain is worse and she feels nauseated. Was sent from her PCP< Dr. Kenna Gilbert office to come to the ER for CT scan and IV abx. Pt reports abdominal pain 8/10.

## 2011-09-23 NOTE — ED Provider Notes (Addendum)
History     CSN: [id number]  Arrival date & time 09/23/11  1545   First MD Initiated Contact with Patient 09/23/11 1743      Chief Complaint  Patient presents with  . Abdominal Pain   apparently recently treated for diverticulitis with Cipro and Flagyl by her GI doctor. Patient presented to Dr. Kenna Gilbert office today. He and was sent to the ED for further evaluation. Sent for requested CT scan. Patient reports persistent left lower quadrant pain, which she rates as 8/10, nonradiating. She reports one episode of diarrhea per day, which she described as "dark" she's had no fevers. She does note that she has been on Cipro and Flagyl for 9 days. She is requesting to be admitted. She is requesting IV antibiotics. She's had no urinary symptoms. States this feels typical for diverticulitis that she has had in the past  (Consider location/radiation/quality/duration/timing/severity/associated sxs/prior treatment) HPI  Past Medical History  Diagnosis Date  . Diverticulitis   . Cervical spinal stenosis   . Hypertension   . Hypercholesteremia     Past Surgical History  Procedure Date  . Thoracic fusion   . Abdominal hysterectomy   . Beningn tumor removal     small intestine  . Colon resection     No family history on file.  History  Substance Use Topics  . Smoking status: Current Everyday Smoker -- 1.0 packs/day  . Smokeless tobacco: Not on file  . Alcohol Use: No    OB History    Grav Para Term Preterm Abortions TAB SAB Ect Mult Living                  Review of Systems  All other systems reviewed and are negative.    Allergies  Review of patient's allergies indicates no known allergies.  Home Medications   Current Outpatient Rx  Name Route Sig Dispense Refill  . VITAMIN C PO Oral Take 1 tablet by mouth.    . OYSTER SHELL CALCIUM PO Oral Take 1 tablet by mouth daily.    Marland Kitchen SLOW FE PO Oral Take 1 tablet by mouth daily.    Marland Kitchen HYDROCODONE-ACETAMINOPHEN PO Oral Take 1  tablet by mouth daily as needed. Pain.    . MULTI-VITAMIN/MINERALS PO TABS Oral Take 1 tablet by mouth daily.    Marland Kitchen SIMVASTATIN 40 MG PO TABS Oral Take 40 mg by mouth every evening.    Marland Kitchen TELMISARTAN 40 MG PO TABS Oral Take 40 mg by mouth daily.    Marland Kitchen VITAMIN D (ERGOCALCIFEROL) 50000 UNITS PO CAPS Oral Take 50,000 Units by mouth every 7 (seven) days.      BP 100/51  Pulse 62  Temp(Src) 98.7 F (37.1 C) (Oral)  Resp 18  Ht 5' 5.5" (1.664 m)  Wt 218 lb (98.884 kg)  BMI 35.73 kg/m2  SpO2 96%  Physical Exam  Nursing note and vitals reviewed. Constitutional: She is oriented to person, place, and time. She appears well-developed and well-nourished.  HENT:  Head: Normocephalic and atraumatic.  Eyes: Conjunctivae and EOM are normal. Pupils are equal, round, and reactive to light.  Neck: Neck supple.  Cardiovascular: Normal rate and regular rhythm.  Exam reveals no gallop and no friction rub.   No murmur heard. Pulmonary/Chest: Breath sounds normal. She has no wheezes. She has no rales. She exhibits no tenderness.  Abdominal: Soft. Bowel sounds are normal. She exhibits distension. There is tenderness. There is no rebound and no guarding.  Mild distention, left lower quadrant tenderness to deep palpation. There is no rebound, rigidity or guarding  Musculoskeletal: Normal range of motion.  Neurological: She is alert and oriented to person, place, and time. No cranial nerve deficit. Coordination normal.  Skin: Skin is warm and dry. No rash noted.  Psychiatric: She has a normal mood and affect.    ED Course  Procedures (including critical care time)  Labs Reviewed  CBC - Abnormal; Notable for the following:    WBC 13.8 (*)    Hemoglobin 15.3 (*)    All other components within normal limits  DIFFERENTIAL - Abnormal; Notable for the following:    Neutro Abs 7.9 (*)    Lymphs Abs 4.6 (*)    Monocytes Absolute 1.1 (*)    All other components within normal limits  COMPREHENSIVE  METABOLIC PANEL - Abnormal; Notable for the following:    Glucose, Bld 100 (*)    Total Protein 9.0 (*)    GFR calc non Af Amer 73 (*)    GFR calc Af Amer 85 (*)    All other components within normal limits  LIPASE, BLOOD - Abnormal; Notable for the following:    Lipase 9 (*)    All other components within normal limits  URINALYSIS, ROUTINE W REFLEX MICROSCOPIC - Abnormal; Notable for the following:    Color, Urine AMBER (*) BIOCHEMICALS MAY BE AFFECTED BY COLOR   APPearance CLOUDY (*)    Hgb urine dipstick SMALL (*)    Ketones, ur TRACE (*)    Leukocytes, UA SMALL (*)    All other components within normal limits  URINE MICROSCOPIC-ADD ON - Abnormal; Notable for the following:    Squamous Epithelial / LPF FEW (*)    Bacteria, UA FEW (*)    All other components within normal limits   Ct Abdomen Pelvis W Contrast  09/23/2011  *RADIOLOGY REPORT*  Clinical Data: Diverticulitis, worsening abdominal pain  CT ABDOMEN AND PELVIS WITH CONTRAST  Technique:  Multidetector CT imaging of the abdomen and pelvis was performed following the standard protocol during bolus administration of intravenous contrast.  Contrast:  100 ml Omnipaque-300 IV  Comparison: 09/04/2007  Findings: Visualized lung bases are clear.  Unremarkable liver, gallbladder, spleen.  Diffuse bilateral adrenal enlargement without focal mass.  There is diffuse pancreatic parenchymal atrophy as before.  Kidneys unremarkable.  Aortoiliac arterial calcifications without aneurysm.  Vascular clips in the left upper abdomen. Stomach and small bowel are nondilated.  Small umbilical and ventral hernias containing loops of small bowel without evidence of obstruction or strangulation.  Appendix not discretely identified. The colon is nondilated with scattered diverticula; no significant adjacent inflammatory/edematous change.  Urinary bladder physiologically distended.  Bilateral pelvic phleboliths.  Previous hysterectomy.  No ascites.  No free air.   No pelvic or abdominal adenopathy.  Portal vein patent.  Spondylitic changes in the lower lumbar spine.  Ureters decompressed.  IMPRESSION:  1. Small umbilical and ventral hernias containing small bowel loops without obstruction or strangulation. 2.  Scattered colonic diverticula. 3.  Aortoiliac vascular calcifications.  Original Report Authenticated By: Osa Craver, M.D.     No diagnosis found.    MDM  Patient is seen and examined, initial history and physical is completed. Evaluation initiated   Results for orders placed during the hospital encounter of 09/23/11  CBC      Component Value Range   WBC 13.8 (*) 4.0 - 10.5 (K/uL)   RBC 4.72  3.87 - 5.11 (  MIL/uL)   Hemoglobin 15.3 (*) 12.0 - 15.0 (g/dL)   HCT 08.6  57.8 - 46.9 (%)   MCV 91.7  78.0 - 100.0 (fL)   MCH 32.4  26.0 - 34.0 (pg)   MCHC 35.3  30.0 - 36.0 (g/dL)   RDW 62.9  52.8 - 41.3 (%)   Platelets 293  150 - 400 (K/uL)  DIFFERENTIAL      Component Value Range   Neutrophils Relative 57  43 - 77 (%)   Neutro Abs 7.9 (*) 1.7 - 7.7 (K/uL)   Lymphocytes Relative 33  12 - 46 (%)   Lymphs Abs 4.6 (*) 0.7 - 4.0 (K/uL)   Monocytes Relative 8  3 - 12 (%)   Monocytes Absolute 1.1 (*) 0.1 - 1.0 (K/uL)   Eosinophils Relative 2  0 - 5 (%)   Eosinophils Absolute 0.2  0.0 - 0.7 (K/uL)   Basophils Relative 1  0 - 1 (%)   Basophils Absolute 0.1  0.0 - 0.1 (K/uL)  COMPREHENSIVE METABOLIC PANEL      Component Value Range   Sodium 135  135 - 145 (mEq/L)   Potassium 3.5  3.5 - 5.1 (mEq/L)   Chloride 99  96 - 112 (mEq/L)   CO2 26  19 - 32 (mEq/L)   Glucose, Bld 100 (*) 70 - 99 (mg/dL)   BUN 12  6 - 23 (mg/dL)   Creatinine, Ser 2.44  0.50 - 1.10 (mg/dL)   Calcium 9.3  8.4 - 01.0 (mg/dL)   Total Protein 9.0 (*) 6.0 - 8.3 (g/dL)   Albumin 4.2  3.5 - 5.2 (g/dL)   AST 21  0 - 37 (U/L)   ALT 28  0 - 35 (U/L)   Alkaline Phosphatase 47  39 - 117 (U/L)   Total Bilirubin 0.6  0.3 - 1.2 (mg/dL)   GFR calc non Af Amer 73 (*) >90  (mL/min)   GFR calc Af Amer 85 (*) >90 (mL/min)  LIPASE, BLOOD      Component Value Range   Lipase 9 (*) 11 - 59 (U/L)  URINALYSIS, ROUTINE W REFLEX MICROSCOPIC      Component Value Range   Color, Urine AMBER (*) YELLOW    APPearance CLOUDY (*) CLEAR    Specific Gravity, Urine 1.022  1.005 - 1.030    pH 5.5  5.0 - 8.0    Glucose, UA NEGATIVE  NEGATIVE (mg/dL)   Hgb urine dipstick SMALL (*) NEGATIVE    Bilirubin Urine NEGATIVE  NEGATIVE    Ketones, ur TRACE (*) NEGATIVE (mg/dL)   Protein, ur NEGATIVE  NEGATIVE (mg/dL)   Urobilinogen, UA 0.2  0.0 - 1.0 (mg/dL)   Nitrite NEGATIVE  NEGATIVE    Leukocytes, UA SMALL (*) NEGATIVE   URINE MICROSCOPIC-ADD ON      Component Value Range   Squamous Epithelial / LPF FEW (*) RARE    WBC, UA 3-6  <3 (WBC/hpf)   RBC / HPF 0-2  <3 (RBC/hpf)   Bacteria, UA FEW (*) RARE    No results found.        CT reviewed, will discuss with patient's GI doctor       Theron Arista A. Patrica Duel, MD 09/23/11 2024   9:21 PM  CT scan findings were discussed with Dr. Elnoria Howard from GI. He also agreed that the patient could go home. There was no sign of any colitis or diverticulitis. I did have a long talk in the room with the patient and gave  her her test results. She had previously been given empiric IV antibiotics as initially she thought she had "diverticulitis" the patient's electrolyte panel and CO2 was normal. Her lipase was low. There was a slightly elevated white blood cell count at 13.8.  Patient was told that her scan was normal and that she could go home. Initially, the patient's seemed pleased that her CAT scan was normal and did not show any acute findings or inflammation. We then discussed that we would run the rest of her fluids and based on the fact that she has had some diarrhea. Shortly thereafter. The patient's sister arrived and the patient demanded her IV be taken out. Immediately. I talked to them and asked them why they were upset and they said  because "clearly. She has a bad infection" I then told them that of course, I am flexible and would be willing to talk with the hospitalist to admit her for IV fluids and pain control. However, at that point in time they walked out without the discharge papers.  Again, the CT scan showed no acute findings, no inflammation. No diverticulitis. I feel the patient's symptoms to have a certain chronicity and would certainly be amenable to outpatient followup. Patient was told she could followup with Dr. Loreta Ave in the office. This week and she was encouraged to return to the ED for any concerns at any time. Again, patient essentially demanded her IV be taken out and walked out without receiving any papers were prescriptions. I did try to speak with the patient and her sister however, for some reason they were very upset that the scan was normal  Theron Arista A. Patrica Duel, MD 09/23/11 2124  Lorelle Gibbs. Patrica Duel, MD 09/23/11 2126

## 2011-09-23 NOTE — ED Notes (Signed)
Caryl Never home: [phone], cell: [phone]

## 2011-09-24 LAB — URINE CULTURE: Colony Count: 3000

## 2011-12-08 ENCOUNTER — Other Ambulatory Visit: Payer: Self-pay | Admitting: Neurosurgery

## 2011-12-08 DIAGNOSIS — M5412 Radiculopathy, cervical region: Secondary | ICD-10-CM

## 2011-12-08 DIAGNOSIS — M545 Low back pain, unspecified: Secondary | ICD-10-CM

## 2011-12-08 DIAGNOSIS — IMO0002 Reserved for concepts with insufficient information to code with codable children: Secondary | ICD-10-CM

## 2011-12-08 DIAGNOSIS — M542 Cervicalgia: Secondary | ICD-10-CM

## 2011-12-12 ENCOUNTER — Ambulatory Visit
Admission: RE | Admit: 2011-12-12 | Discharge: 2011-12-12 | Disposition: A | Discharge status: Home / Self Care | Payer: 59 | Source: Ambulatory Visit | Attending: Neurosurgery | Admitting: Neurosurgery

## 2011-12-12 DIAGNOSIS — IMO0002 Reserved for concepts with insufficient information to code with codable children: Secondary | ICD-10-CM

## 2011-12-12 DIAGNOSIS — M545 Low back pain, unspecified: Secondary | ICD-10-CM

## 2011-12-12 DIAGNOSIS — M542 Cervicalgia: Secondary | ICD-10-CM

## 2011-12-12 DIAGNOSIS — M5412 Radiculopathy, cervical region: Secondary | ICD-10-CM

## 2011-12-21 ENCOUNTER — Encounter (HOSPITAL_COMMUNITY): Payer: Self-pay | Admitting: Pharmacy Technician

## 2011-12-21 ENCOUNTER — Other Ambulatory Visit: Payer: Self-pay | Admitting: Neurosurgery

## 2011-12-24 NOTE — Pre-Procedure Instructions (Addendum)
20 [name]  12/24/2011   Your procedure is scheduled on:  Thurs, May 16 @ 11:30 AM  Report to [facility] at 8:00 AM.  Call this number if you have problems the morning of surgery: [phone]   Remember:   Do not eat food: nothing after midnight.  May have clear liquids: up to 4 Hours before arrival.(until 4:00 am)  Clear liquids include soda, tea, black coffee, apple or grape juice, broth,water  Take these medicines the morning of surgery with A SIP OF WATER: Pain Pill(if needed)              * Discontinue Aspirin,Coumadin, Plavix, Effient and any herbal medications.   Do not wear jewelry, make-up or nail polish.  Do not wear lotions, powders, or perfumes.   Do not shave 48 hours prior to surgery.  Do not bring valuables to the hospital.  Contacts, dentures or bridgework may not be worn into surgery.  Leave suitcase in the car. After surgery it may be brought to your room.  For patients admitted to the hospital, checkout time is 11:00 AM the day of discharge.   Special Instructions: CHG Shower Use Special Wash: 1/2 bottle night before surgery and 1/2 bottle morning of surgery.   Please read over the following fact sheets that you were given: Pain Booklet, Coughing and Deep Breathing, MRSA Information and Surgical Site Infection Prevention

## 2011-12-27 ENCOUNTER — Encounter (HOSPITAL_COMMUNITY)
Admission: RE | Admit: 2011-12-27 | Discharge: 2011-12-27 | Disposition: A | Discharge status: Home / Self Care | Payer: 59 | Source: Ambulatory Visit | Attending: Neurosurgery | Admitting: Neurosurgery

## 2011-12-27 ENCOUNTER — Encounter (HOSPITAL_COMMUNITY): Payer: Self-pay

## 2011-12-27 ENCOUNTER — Encounter (HOSPITAL_COMMUNITY)
Admission: RE | Admit: 2011-12-27 | Discharge: 2011-12-27 | Disposition: A | Discharge status: Home / Self Care | Payer: 59 | Source: Ambulatory Visit | Attending: Anesthesiology | Admitting: Anesthesiology

## 2011-12-27 LAB — CBC
Hemoglobin: 15.3 g/dL — ABNORMAL HIGH (ref 12.0–15.0)
RBC: 4.65 MIL/uL (ref 3.87–5.11)
WBC: 20.1 10*3/uL — ABNORMAL HIGH (ref 4.0–10.5)

## 2011-12-27 LAB — SURGICAL PCR SCREEN
MRSA, PCR: NEGATIVE
Staphylococcus aureus: NEGATIVE

## 2011-12-27 LAB — BASIC METABOLIC PANEL
GFR calc Af Amer: 90 mL/min — ABNORMAL LOW (ref >90)
GFR calc non Af Amer: 77 mL/min — ABNORMAL LOW (ref >90)
Potassium: 4.6 mEq/L (ref 3.5–5.1)
Sodium: 136 mEq/L (ref 135–145)

## 2011-12-27 NOTE — Progress Notes (Signed)
Pt seen in PAT.  Reports PCP: Dr. Maurice Small, Family Medicine w/ [phone],fax#[phone]).  Denies cardiologist, any stress tests or sleep studies.//L. Yolande Skoda,RN

## 2011-12-27 NOTE — Progress Notes (Signed)
Noted EKG and WBC(=20.1) abnormal.  Fax received from Dr. Marcy Siren office- they had no record of EKG's or CXR's in their system. Jaynie Collins, PA-C for Anesthesiology, notfied to review chart.//L. Sharilynn Cassity,RN

## 2011-12-27 NOTE — Progress Notes (Signed)
Noted that EKG was abnormal and none to compare in EPIC.  Dr. Jone Baseman office [phone] and requested Copies of OV notes and EKG.

## 2011-12-28 NOTE — Consult Note (Signed)
Anesthesia Chart Review:  Patient is a 61 year old female scheduled for C3-7 ACDF on 12/30/11.  History includes smoking, obesity with BMI 35, diverticulitis with history of colon resection, hypercholesterolemia, prior cervical and  thoracic surgeries.  PCP is Dr. Maurice Small.  A c-spine MRI on 12/12/11 showed: No change since 10/31/2006.  Previous posterior decompression on the right at C5-6.  Foraminal stenosis bilaterally from C2-3 through C6-7. Neural compression could occur on either side through that region. Canal narrowing because of spondylosis, most pronounced at C3-4, C4- 5 and C6-7. The cord is indented slightly at the C4-5 and C6-7 levels.  CXR on 12/27/11 showed no acute cardiopulmonary abnormality.  Labs noted.  WBC 20.1.  I do not seen any steroids listed on her medication list.  No UA was ordered.  CXR was okay.  Results called to [facility] at Dr. Fredrich Birks office.  I will order a repeat CBC on arrival.  Defer additional orders to Dr. Venetia Maxon.  EKG on 12/27/11 showed SB at 58 bpm, cannot rule out anterior infarct (age undetermined).  She has numerous EKGs in [place] since 2001.  Poor r wave progression noted in V1-3 (new in V3 since October 2004), but otherwise I think her EKG is stable.  No CV symptoms documented at her PAT appointment.  She has no known history of CAD/MI/CHF or DM.    If patient does not appear acutely ill, no obvious s/s of infection, and no new CV symptoms then would anticipate she could proceed from an Anesthesia standpoint.  Shonna Chock, PA-C

## 2011-12-29 MED ORDER — CEFAZOLIN SODIUM-DEXTROSE 2-3 GM-% IV SOLR
2.0000 g | INTRAVENOUS | Status: AC
  Administered 2011-12-30: 2 g via INTRAVENOUS
  Filled 2011-12-29: qty 50

## 2011-12-29 NOTE — H&P (Signed)
[name]  #[id number] DOB:  March 14, 1951 12/20/2011:  [name] returns today.  She is continuing to have a lot of pain in her neck and is quite miserable with this regard.    I reviewed the MRI of her cervical spine which shows significant spondylosis and disc herniations at the C3-4, C4-5, C5-6, and C6-7 levels.  There is bilateral foraminal stenosis from C3-4, C4-5, C5-6 and C6-7 levels.  There is cord indentation at C4-5 and C6-7 levels. There appears to be foraminal stenosis at each of the levels in her cervical spine.    With regard to the lumbar degenerative changes, she has facet degeneration at L3-4 and hypertrophy with anterolisthesis of L1 mm.  At L4-5 there is facet degeneration and hypertrophy bulging the disc focally prominent in the left extraforaminal region, potential for affecting the left L4 nerve root in the extraforaminal region.    At this point, [name] is complaining of left L4 radicular symptoms and I think this relates to her lumbar pathology and I have recommended that we do a left L4 selective nerve root block.  I have set this up for 12/24/2011.   With regard to her cervical spine, she is having significant persistent neck pain and arm pain. She continues to be quite miserable. She has stopped smoking.  Based on her imaging findings, I have recommended that she go ahead with anterior cervical surgery and this would involve the C3-4, C4-5, C5-6, and C6-7 levels, all of which have significant structural pathology as well as canal stenosis and cord compression with biforaminal stenosis.  She wishes to go ahead with surgery and we have set this up for 12/30/2011.    Risks and benefits were discussed in detail with the patient and she wishes to proceed.           Danae Orleans. Venetia Maxon, M.D./[facility]   [name]   DOB:  [birth year]   #[id number]   February 26, 2004   HISTORY OF PRESENT ILLNESS:  [name] is a 61 year old woman who works in a full-time clerical  position at [employer] who says she does a lot of desk work getting up and down a lot, who complains of bilateral shoulder pain down to below her deltoids. She says it is aggravating when she moves or raises her arms and is relieved when she lies still. She says she is a lot better than she was at first in February when she was having a considerable amount of pain.  She currently describes her pain level at 4 out of 10 in severity.  She says that her posterior neck is not quite as painful as the pain into both her shoulders. She does not note any numbness and does not note any weakness.  She had Cortisone shots in her shoulders in March which she said helped her a lot and she also has had nerve blocks which did not really help.  She has had physical therapy for a couple visits a few months ago. She has been taking Mobic 15 mg. daily, but says when she took a full tablet it upset her stomach. She has been taking Vicodin and Xanax as well and is taking Darvocet for less severe pain. She saw an orthopedic doctor who told her she had C6-7 disc degeneration and [name] had an MRI of her cervical spine performed through [facility] on 10/19/03 which shows spondylosis at C4-5, C5-6, and C6-7 levels, most severe at the C6-7 level where there  is disc osteophyte complex resulting in moderate central stenosis and moderate to severe foraminal stenosis bilaterally.  There is right C5-6 foraminal stenosis and on closer review it is clear that she has had a previous posterior cervical laminectomy at this level on the right. She has C4-5 uncinate spurring resulting in moderate foraminal stenosis bilaterally, left greater than right.    [name] denies any bowel or bladder dysfunction.  She complains of throbbing and pounding in her neck. She initially thought that she was having heart problems and had an ECG which was negative.  She says this is interfering with the quality of her life.    REVIEW OF SYSTEMS:   A detailed Review of  Systems sheet was reviewed with the patient.  Pertinent positives include under cardiovascular - she has high blood pressure; under gastrointestinal - she notes indigestion or pain with eating.  All other systems are negative; this includes Constitutional symptoms, Eyes, Ears, nose, mouth, throat, Endocrine, Respiratory, Genitourinary, Musculoskeletal, Integumentary & Breast, Neurologic, Psychiatric, Hematologic/Lymphatic, Allergic/Immunologic.    PAST MEDICAL HISTORY:      Prior Operations and Hospitalizations:   Prior surgery consists of a right C5-6 foraminotomy by Dr. Elesa Hacker in 1992, hysterectomy by Dr. Ambrose Mantle in 1995, tumor removed by Dr. Chevis Pretty and Dr. Derrell Lolling and had colon surgery in 2004 by Dr. Lindie Spruce.      Medications and Allergies:  She has no known drug allergies.  Current medications are Protonix daily, Hydrochlorothiazide daily, Mobic daily, Vicodin as needed, Xanax as needed, and Darvocet.      Height and Weight:     She is 5'6" tall, 210 lbs.    FAMILY HISTORY:    Her mother is 2 with arthritis.  Her father is 14 and had a heart attack.    SOCIAL HISTORY:    She is a pack per day smoker. She is a nondrinker of alcoholic beverages.  No history of substance abuse.    PHYSICAL EXAMINATION:      Blood Pressure, Pulse:     [name]'s blood pressure is 132/92 in the left arm seated.  Heart rate is 78 and regular.      HEENT - normocephalic, atraumatic.  The pupils are equal, round and reactive to light.  The extraocular muscles are intact.  Sclerae - white.  Conjunctiva - pink.  Oropharynx benign.  Uvula midline.     Neck - there are no masses, meningismus, deformities, tracheal deviation, jugular vein distention or carotid bruits.  She has decreased range of motion of her cervical spine especially with rotation toward the left.  She has a negative Spurlings' maneuver bilaterally. Negative Lhermitte's sign with axial compression.      Respiratory - she has decreased breath sounds at  the bases.        Cardiovascular - the heart has regular rate and rhythm to auscultation.  No murmurs are appreciated.  There is no extremity edema, cyanosis or clubbing.  There are palpable pedal pulses.      Abdomen - obese, soft, nontender, no hepatosplenomegaly appreciated or masses.  There are active bowel sounds.  No guarding or rebound.    Musculoskeletal Examination - she has positive left shoulder impingement testing.  She has a healed posterior cervical incision.      NEUROLOGICAL EXAMINATION: The patient is oriented to time, person and place and has good recall of both recent and remote memory with normal attention span and concentration.  The patient speaks with clear and fluent  speech and exhibits normal language function and appropriate fund of knowledge.      Cranial Nerve Examination - pupils are equal, round and reactive to light.  Extraocular movements are full.  Visual fields are full to confrontational testing.  Facial sensation and facial movement are symmetric and intact.  Hearing is intact to finger rub.  Palate is upgoing.  Shoulder shrug is symmetric.  Tongue protrudes in the midline.      Motor Examination - motor strength is 5/5 in the bilateral deltoids, biceps, triceps, handgrips, wrist extensors, interosseous.  In the lower extremities motor strength is 5/5 in hip flexion, extension, quadriceps, hamstrings, plantar flexion, dorsiflexion and extensor hallucis longus.      Sensory Examination - normal to light touch and pinprick sensation in the upper and lower extremities.     Deep Tendon Reflexes - trace in the biceps and triceps, and brachioradialis, negative Hoffmann's signs bilaterally, 3 in the knees, 2 in the ankles.  The great toes are downgoing to plantar stimulation.    IMPRESSION AND RECOMMENDATIONS: [name] is a 61 year old woman with bilateral shoulder and neck pain with some positive shoulder impingement testing on the left. She has had a previous  foraminotomy at C5-6 on the right by Dr. Elesa Hacker.  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  On examination today she does not have significant evidence of arm weakness.  I am a bit concerned about her cervical stenosis, but do think that since she does not have any frank weakness I would not recommend any surgery at the present time, also considering that she has three-level disease in her neck which would need to be addressed if we were going to do surgery.  I have recommended that she continue with her exercise program and gave her some samples of Celebrex.  I am going to see her back in three months for followup to make sure she is getting better and then think that I should see her on a six monthly basis to make sure she does not develop any symptoms consistent with a cervical myelopathy.  I discussed these in detail with her and she is aware of what to look for.      [facility]    Danae Orleans. Venetia Maxon, M.D.

## 2011-12-30 ENCOUNTER — Encounter (HOSPITAL_COMMUNITY): Payer: Self-pay | Admitting: General Practice

## 2011-12-30 ENCOUNTER — Encounter (HOSPITAL_COMMUNITY): Payer: Self-pay | Admitting: Vascular Surgery

## 2011-12-30 ENCOUNTER — Ambulatory Visit (HOSPITAL_COMMUNITY): Payer: 59 | Admitting: Vascular Surgery

## 2011-12-30 ENCOUNTER — Encounter (HOSPITAL_COMMUNITY)
Admission: RE | Disposition: A | Discharge status: Home / Self Care | Payer: Self-pay | Source: Ambulatory Visit | Attending: Neurosurgery

## 2011-12-30 ENCOUNTER — Ambulatory Visit (HOSPITAL_COMMUNITY): Payer: 59

## 2011-12-30 ENCOUNTER — Encounter (HOSPITAL_COMMUNITY): Payer: Self-pay | Admitting: *Deleted

## 2011-12-30 ENCOUNTER — Inpatient Hospital Stay (HOSPITAL_COMMUNITY)
Admission: RE | Admit: 2011-12-30 | Discharge: 2012-01-01 | DRG: 473 | Disposition: A | Discharge status: Home / Self Care | Payer: 59 | Source: Ambulatory Visit | Attending: Neurosurgery | Admitting: Neurosurgery

## 2011-12-30 ENCOUNTER — Inpatient Hospital Stay (HOSPITAL_COMMUNITY): Payer: 59

## 2011-12-30 DIAGNOSIS — M4712 Other spondylosis with myelopathy, cervical region: Principal | ICD-10-CM | POA: Diagnosis present

## 2011-12-30 DIAGNOSIS — Z01812 Encounter for preprocedural laboratory examination: Secondary | ICD-10-CM

## 2011-12-30 DIAGNOSIS — M5137 Other intervertebral disc degeneration, lumbosacral region: Secondary | ICD-10-CM | POA: Diagnosis present

## 2011-12-30 DIAGNOSIS — M51379 Other intervertebral disc degeneration, lumbosacral region without mention of lumbar back pain or lower extremity pain: Secondary | ICD-10-CM | POA: Diagnosis present

## 2011-12-30 DIAGNOSIS — F17200 Nicotine dependence, unspecified, uncomplicated: Secondary | ICD-10-CM | POA: Diagnosis present

## 2011-12-30 HISTORY — PX: ANTERIOR CERVICAL DECOMP/DISCECTOMY FUSION: SHX1161

## 2011-12-30 LAB — CBC
Hemoglobin: 15.5 g/dL — ABNORMAL HIGH (ref 12.0–15.0)
RBC: 4.74 MIL/uL (ref 3.87–5.11)

## 2011-12-30 SURGERY — ANTERIOR CERVICAL DECOMPRESSION/DISCECTOMY FUSION 4 LEVELS
Anesthesia: General | Site: Neck | Wound class: Clean

## 2011-12-30 MED ORDER — ACETAMINOPHEN 325 MG PO TABS: 650.0000 mg | ORAL_TABLET | ORAL | Status: DC | PRN

## 2011-12-30 MED ORDER — IRBESARTAN 75 MG PO TABS
75.0000 mg | ORAL_TABLET | Freq: Every day | ORAL | Status: DC
  Administered 2011-12-30 – 2012-01-01 (×3): 75 mg via ORAL
  Filled 2011-12-30 (×3): qty 1

## 2011-12-30 MED ORDER — HYDROMORPHONE HCL PF 1 MG/ML IJ SOLN
INTRAMUSCULAR | Status: AC
  Filled 2011-12-30: qty 1

## 2011-12-30 MED ORDER — MULTI-VITAMIN/MINERALS PO TABS: 1.0000 | ORAL_TABLET | Freq: Every day | ORAL | Status: DC

## 2011-12-30 MED ORDER — KCL IN DEXTROSE-NACL 20-5-0.45 MEQ/L-%-% IV SOLN
INTRAVENOUS | Status: DC
  Administered 2011-12-30: 18:00:00 via INTRAVENOUS
  Administered 2011-12-31: 75 mL via INTRAVENOUS
  Filled 2011-12-30 (×5): qty 1000

## 2011-12-30 MED ORDER — DEXAMETHASONE SODIUM PHOSPHATE 4 MG/ML IJ SOLN
INTRAMUSCULAR | Status: DC | PRN
  Administered 2011-12-30: 12 mg via INTRAVENOUS

## 2011-12-30 MED ORDER — ZOLPIDEM TARTRATE 10 MG PO TABS: 10.0000 mg | ORAL_TABLET | Freq: Every evening | ORAL | Status: DC | PRN

## 2011-12-30 MED ORDER — MIDAZOLAM HCL 5 MG/5ML IJ SOLN
INTRAMUSCULAR | Status: DC | PRN
  Administered 2011-12-30 (×2): 1 mg via INTRAVENOUS

## 2011-12-30 MED ORDER — LIDOCAINE-EPINEPHRINE 1 %-1:100000 IJ SOLN
INTRAMUSCULAR | Status: DC | PRN
  Administered 2011-12-30: 10 mL

## 2011-12-30 MED ORDER — HYDROCODONE-ACETAMINOPHEN 10-325 MG PO TABS: 1.0000 | ORAL_TABLET | ORAL | Status: DC | PRN

## 2011-12-30 MED ORDER — KCL IN DEXTROSE-NACL 20-5-0.45 MEQ/L-%-% IV SOLN
INTRAVENOUS | Status: AC
  Filled 2011-12-30: qty 1000

## 2011-12-30 MED ORDER — SODIUM CHLORIDE 0.9 % IV SOLN
INTRAVENOUS | Status: AC
  Filled 2011-12-30: qty 500

## 2011-12-30 MED ORDER — DEXTROSE 5 % IV SOLN
INTRAVENOUS | Status: DC | PRN
  Administered 2011-12-30: 12:00:00 via INTRAVENOUS

## 2011-12-30 MED ORDER — MENTHOL 3 MG MT LOZG: 1.0000 | LOZENGE | OROMUCOSAL | Status: DC | PRN

## 2011-12-30 MED ORDER — OXYCODONE-ACETAMINOPHEN 5-325 MG PO TABS
1.0000 | ORAL_TABLET | ORAL | Status: DC | PRN
  Administered 2011-12-31: 2 via ORAL
  Filled 2011-12-30: qty 2

## 2011-12-30 MED ORDER — BUPIVACAINE HCL (PF) 0.25 % IJ SOLN
INTRAMUSCULAR | Status: DC | PRN
  Administered 2011-12-30: 10 mL

## 2011-12-30 MED ORDER — VITAMIN D (ERGOCALCIFEROL) 1.25 MG (50000 UNIT) PO CAPS
50000.0000 [IU] | ORAL_CAPSULE | ORAL | Status: DC
  Administered 2011-12-30: 50000 [IU] via ORAL
  Filled 2011-12-30: qty 1

## 2011-12-30 MED ORDER — SIMVASTATIN 40 MG PO TABS
40.0000 mg | ORAL_TABLET | Freq: Every evening | ORAL | Status: DC
  Administered 2011-12-30 – 2011-12-31 (×2): 40 mg via ORAL
  Filled 2011-12-30 (×3): qty 1

## 2011-12-30 MED ORDER — 0.9 % SODIUM CHLORIDE (POUR BTL) OPTIME
TOPICAL | Status: DC | PRN
  Administered 2011-12-30: 1000 mL

## 2011-12-30 MED ORDER — DIAZEPAM 5 MG PO TABS
5.0000 mg | ORAL_TABLET | Freq: Four times a day (QID) | ORAL | Status: DC | PRN
  Administered 2011-12-30 – 2011-12-31 (×2): 5 mg via ORAL
  Filled 2011-12-30 (×2): qty 1

## 2011-12-30 MED ORDER — FENTANYL CITRATE 0.05 MG/ML IJ SOLN
INTRAMUSCULAR | Status: DC | PRN
  Administered 2011-12-30: 150 ug via INTRAVENOUS
  Administered 2011-12-30 (×2): 50 ug via INTRAVENOUS
  Administered 2011-12-30: 100 ug via INTRAVENOUS
  Administered 2011-12-30 (×3): 50 ug via INTRAVENOUS

## 2011-12-30 MED ORDER — SODIUM CHLORIDE 0.9 % IJ SOLN
3.0000 mL | Freq: Two times a day (BID) | INTRAMUSCULAR | Status: DC
  Administered 2011-12-30: 3 mL via INTRAVENOUS

## 2011-12-30 MED ORDER — HYDROMORPHONE HCL PF 1 MG/ML IJ SOLN
0.2500 mg | INTRAMUSCULAR | Status: DC | PRN
  Administered 2011-12-30 (×3): 0.5 mg via INTRAVENOUS

## 2011-12-30 MED ORDER — ONDANSETRON HCL 4 MG/2ML IJ SOLN
4.0000 mg | INTRAMUSCULAR | Status: DC | PRN
Start: 2011-12-30 — End: 2012-01-01

## 2011-12-30 MED ORDER — NEOSTIGMINE METHYLSULFATE 1 MG/ML IJ SOLN
INTRAMUSCULAR | Status: DC | PRN
  Administered 2011-12-30: 3 mg via INTRAVENOUS

## 2011-12-30 MED ORDER — LACTATED RINGERS IV SOLN
INTRAVENOUS | Status: DC | PRN
  Administered 2011-12-30: 13:00:00 via INTRAVENOUS

## 2011-12-30 MED ORDER — ONDANSETRON HCL 4 MG/2ML IJ SOLN
4.0000 mg | Freq: Once | INTRAMUSCULAR | Status: DC | PRN
Start: 2011-12-30 — End: 2011-12-30

## 2011-12-30 MED ORDER — GLYCOPYRROLATE 0.2 MG/ML IJ SOLN
INTRAMUSCULAR | Status: DC | PRN
  Administered 2011-12-30: .4 mg via INTRAVENOUS

## 2011-12-30 MED ORDER — BACITRACIN 50000 UNITS IM SOLR
INTRAMUSCULAR | Status: AC
  Filled 2011-12-30: qty 1

## 2011-12-30 MED ORDER — DEXAMETHASONE SODIUM PHOSPHATE 10 MG/ML IJ SOLN
10.0000 mg | Freq: Once | INTRAMUSCULAR | Status: AC
  Administered 2011-12-30: 10 mg via INTRAVENOUS
  Filled 2011-12-30: qty 1

## 2011-12-30 MED ORDER — LIDOCAINE HCL (CARDIAC) 20 MG/ML IV SOLN
INTRAVENOUS | Status: DC | PRN
  Administered 2011-12-30: 65 mg via INTRAVENOUS

## 2011-12-30 MED ORDER — GELATIN ABSORBABLE 100 EX MISC
CUTANEOUS | Status: DC | PRN
  Administered 2011-12-30: 13:00:00 via TOPICAL

## 2011-12-30 MED ORDER — PHENYLEPHRINE HCL 10 MG/ML IJ SOLN
20.0000 mg | INTRAVENOUS | Status: DC | PRN
  Administered 2011-12-30: 25 ug/min via INTRAVENOUS

## 2011-12-30 MED ORDER — DEXAMETHASONE SODIUM PHOSPHATE 4 MG/ML IJ SOLN
6.0000 mg | Freq: Four times a day (QID) | INTRAMUSCULAR | Status: DC
  Administered 2011-12-31 (×2): 6 mg via INTRAVENOUS
  Filled 2011-12-30: qty 1.5
  Filled 2011-12-30: qty 2
  Filled 2011-12-30 (×2): qty 1.5
  Filled 2011-12-30: qty 2
  Filled 2011-12-30: qty 1.5

## 2011-12-30 MED ORDER — MORPHINE SULFATE 2 MG/ML IJ SOLN
1.0000 mg | INTRAMUSCULAR | Status: DC | PRN
  Administered 2011-12-30: 4 mg via INTRAVENOUS
  Administered 2011-12-30: 2 mg via INTRAVENOUS
  Administered 2011-12-31: 4 mg via INTRAVENOUS
  Filled 2011-12-30 (×2): qty 2
  Filled 2011-12-30: qty 1

## 2011-12-30 MED ORDER — ACETAMINOPHEN 10 MG/ML IV SOLN
INTRAVENOUS | Status: AC
  Filled 2011-12-30: qty 100

## 2011-12-30 MED ORDER — ADULT MULTIVITAMIN W/MINERALS CH
1.0000 | ORAL_TABLET | Freq: Every day | ORAL | Status: DC
  Administered 2011-12-30 – 2012-01-01 (×3): 1 via ORAL
  Filled 2011-12-30 (×3): qty 1

## 2011-12-30 MED ORDER — DOCUSATE SODIUM 100 MG PO CAPS
100.0000 mg | ORAL_CAPSULE | Freq: Two times a day (BID) | ORAL | Status: DC
  Administered 2011-12-30 – 2012-01-01 (×4): 100 mg via ORAL
  Filled 2011-12-30 (×4): qty 1

## 2011-12-30 MED ORDER — PHENOL 1.4 % MT LIQD: 1.0000 | OROMUCOSAL | Status: DC | PRN

## 2011-12-30 MED ORDER — SODIUM CHLORIDE 0.9 % IR SOLN
Status: DC | PRN
  Administered 2011-12-30: 13:00:00

## 2011-12-30 MED ORDER — ROCURONIUM BROMIDE 100 MG/10ML IV SOLN
INTRAVENOUS | Status: DC | PRN
  Administered 2011-12-30: 20 mg via INTRAVENOUS
  Administered 2011-12-30: 50 mg via INTRAVENOUS
  Administered 2011-12-30: 5 mg via INTRAVENOUS

## 2011-12-30 MED ORDER — ONDANSETRON HCL 4 MG/2ML IJ SOLN
INTRAMUSCULAR | Status: DC | PRN
  Administered 2011-12-30: 4 mg via INTRAVENOUS

## 2011-12-30 MED ORDER — SODIUM CHLORIDE 0.9 % IV SOLN: 250.0000 mL | INTRAVENOUS | Status: DC

## 2011-12-30 MED ORDER — FERROUS SULFATE 325 (65 FE) MG PO TABS
325.0000 mg | ORAL_TABLET | Freq: Three times a day (TID) | ORAL | Status: DC
  Administered 2011-12-31 – 2012-01-01 (×5): 325 mg via ORAL
  Filled 2011-12-30 (×7): qty 1

## 2011-12-30 MED ORDER — HYDROCODONE-ACETAMINOPHEN 5-325 MG PO TABS
1.0000 | ORAL_TABLET | ORAL | Status: DC | PRN
  Administered 2011-12-31 – 2012-01-01 (×3): 2 via ORAL
  Filled 2011-12-30: qty 1
  Filled 2011-12-30 (×2): qty 2

## 2011-12-30 MED ORDER — CEFAZOLIN SODIUM 1-5 GM-% IV SOLN
1.0000 g | Freq: Three times a day (TID) | INTRAVENOUS | Status: AC
  Administered 2011-12-30 – 2011-12-31 (×2): 1 g via INTRAVENOUS
  Filled 2011-12-30 (×2): qty 50

## 2011-12-30 MED ORDER — PROPOFOL 10 MG/ML IV EMUL
INTRAVENOUS | Status: DC | PRN
  Administered 2011-12-30: 200 mg via INTRAVENOUS

## 2011-12-30 MED ORDER — ACETAMINOPHEN 10 MG/ML IV SOLN
INTRAVENOUS | Status: DC | PRN
  Administered 2011-12-30: 1000 mg via INTRAVENOUS

## 2011-12-30 MED ORDER — LACTATED RINGERS IV SOLN
INTRAVENOUS | Status: DC | PRN
  Administered 2011-12-30 (×2): via INTRAVENOUS

## 2011-12-30 MED ORDER — SODIUM CHLORIDE 0.9 % IJ SOLN: 3.0000 mL | INTRAMUSCULAR | Status: DC | PRN

## 2011-12-30 MED ORDER — ACETAMINOPHEN 650 MG RE SUPP: 650.0000 mg | RECTAL | Status: DC | PRN

## 2011-12-30 SURGICAL SUPPLY — 77 items
BAG DECANTER FOR FLEXI CONT (MISCELLANEOUS) ×2 IMPLANT
BANDAGE GAUZE ELAST BULKY 4 IN (GAUZE/BANDAGES/DRESSINGS) ×4 IMPLANT
BENZOIN TINCTURE PRP APPL 2/3 (GAUZE/BANDAGES/DRESSINGS) IMPLANT
BIT DRILL 2.3 12 FIXED (INSTRUMENTS) ×1 IMPLANT
BIT DRILL NEURO 2X3.1 SFT TUCH (MISCELLANEOUS) ×1 IMPLANT
BLADE ULTRA TIP 2M (BLADE) ×2 IMPLANT
BONE CERV LORDOTIC 14.5X12X6 (Bone Implant) ×8 IMPLANT
BUR BARREL STRAIGHT FLUTE 4.0 (BURR) ×2 IMPLANT
CANISTER SUCTION 2500CC (MISCELLANEOUS) ×2 IMPLANT
CLOTH BEACON ORANGE TIMEOUT ST (SAFETY) ×2 IMPLANT
CONT SPEC 4OZ CLIKSEAL STRL BL (MISCELLANEOUS) ×2 IMPLANT
COVER MAYO STAND STRL (DRAPES) ×2 IMPLANT
DERMABOND ADVANCED (GAUZE/BANDAGES/DRESSINGS) ×1
DERMABOND ADVANCED .7 DNX12 (GAUZE/BANDAGES/DRESSINGS) ×1 IMPLANT
DRAIN JACKSON PRATT 10MM FLAT (MISCELLANEOUS) ×2 IMPLANT
DRAPE LAPAROTOMY 100X72 PEDS (DRAPES) ×2 IMPLANT
DRAPE MICROSCOPE LEICA (MISCELLANEOUS) IMPLANT
DRAPE POUCH INSTRU U-SHP 10X18 (DRAPES) ×2 IMPLANT
DRAPE PROXIMA HALF (DRAPES) IMPLANT
DRESSING TELFA 8X3 (GAUZE/BANDAGES/DRESSINGS) ×2 IMPLANT
DRILL 12MM (INSTRUMENTS) ×2
DRILL NEURO 2X3.1 SOFT TOUCH (MISCELLANEOUS) ×2
DURAPREP 6ML APPLICATOR 50/CS (WOUND CARE) ×2 IMPLANT
ELECT COATED BLADE 2.86 ST (ELECTRODE) ×2 IMPLANT
ELECT REM PT RETURN 9FT ADLT (ELECTROSURGICAL) ×2
ELECTRODE REM PT RTRN 9FT ADLT (ELECTROSURGICAL) ×1 IMPLANT
EVACUATOR SILICONE 100CC (DRAIN) ×2 IMPLANT
GAUZE SPONGE 4X4 16PLY XRAY LF (GAUZE/BANDAGES/DRESSINGS) IMPLANT
GLOVE BIO SURGEON STRL SZ 6.5 (GLOVE) ×4 IMPLANT
GLOVE BIO SURGEON STRL SZ8 (GLOVE) ×4 IMPLANT
GLOVE BIOGEL PI IND STRL 6.5 (GLOVE) ×1 IMPLANT
GLOVE BIOGEL PI IND STRL 8 (GLOVE) ×1 IMPLANT
GLOVE BIOGEL PI IND STRL 8.5 (GLOVE) ×3 IMPLANT
GLOVE BIOGEL PI INDICATOR 6.5 (GLOVE) ×1
GLOVE BIOGEL PI INDICATOR 8 (GLOVE) ×1
GLOVE BIOGEL PI INDICATOR 8.5 (GLOVE) ×3
GLOVE ECLIPSE 7.5 STRL STRAW (GLOVE) ×2 IMPLANT
GLOVE EXAM NITRILE LRG STRL (GLOVE) IMPLANT
GLOVE EXAM NITRILE MD LF STRL (GLOVE) IMPLANT
GLOVE EXAM NITRILE XL STR (GLOVE) IMPLANT
GLOVE EXAM NITRILE XS STR PU (GLOVE) IMPLANT
GLOVE INDICATOR 7.0 STRL GRN (GLOVE) ×2 IMPLANT
GLOVE SS BIOGEL STRL SZ 6.5 (GLOVE) ×2 IMPLANT
GLOVE SUPERSENSE BIOGEL SZ 6.5 (GLOVE) ×2
GLOVE SURG SS PI 8.5 STRL IVOR (GLOVE) ×3
GLOVE SURG SS PI 8.5 STRL STRW (GLOVE) ×3 IMPLANT
GOWN BRE IMP SLV AUR LG STRL (GOWN DISPOSABLE) ×6 IMPLANT
GOWN BRE IMP SLV AUR XL STRL (GOWN DISPOSABLE) ×4 IMPLANT
GOWN STRL REIN 2XL LVL4 (GOWN DISPOSABLE) ×6 IMPLANT
HEAD HALTER (SOFTGOODS) ×2 IMPLANT
KIT BASIN OR (CUSTOM PROCEDURE TRAY) ×2 IMPLANT
KIT ROOM TURNOVER OR (KITS) ×2 IMPLANT
NEEDLE HYPO 18GX1.5 BLUNT FILL (NEEDLE) IMPLANT
NEEDLE HYPO 25X1 1.5 SAFETY (NEEDLE) ×2 IMPLANT
NEEDLE SPNL 22GX3.5 QUINCKE BK (NEEDLE) ×2 IMPLANT
NS IRRIG 1000ML POUR BTL (IV SOLUTION) ×2 IMPLANT
PACK LAMINECTOMY NEURO (CUSTOM PROCEDURE TRAY) ×2 IMPLANT
PAD ARMBOARD 7.5X6 YLW CONV (MISCELLANEOUS) ×2 IMPLANT
PATTIES SURGICAL .5 X.5 (GAUZE/BANDAGES/DRESSINGS) ×2 IMPLANT
PIN DISTRACTION 14MM (PIN) ×4 IMPLANT
PLATE 64MM (Plate) ×2 IMPLANT
RUBBERBAND STERILE (MISCELLANEOUS) IMPLANT
SCREW 12MM (Screw) ×18 IMPLANT
SCREW BN 12X4.5XST VA (Screw) ×2 IMPLANT
SPONGE GAUZE 4X4 12PLY (GAUZE/BANDAGES/DRESSINGS) ×2 IMPLANT
SPONGE INTESTINAL PEANUT (DISPOSABLE) ×2 IMPLANT
SPONGE SURGIFOAM ABS GEL 100 (HEMOSTASIS) ×2 IMPLANT
STAPLER SKIN PROX WIDE 3.9 (STAPLE) IMPLANT
STRIP CLOSURE SKIN 1/2X4 (GAUZE/BANDAGES/DRESSINGS) IMPLANT
SUT VIC AB 3-0 SH 8-18 (SUTURE) ×4 IMPLANT
SYR 20ML ECCENTRIC (SYRINGE) ×2 IMPLANT
SYR 3ML LL SCALE MARK (SYRINGE) IMPLANT
TOWEL OR 17X24 6PK STRL BLUE (TOWEL DISPOSABLE) ×2 IMPLANT
TOWEL OR 17X26 10 PK STRL BLUE (TOWEL DISPOSABLE) ×2 IMPLANT
TRAP SPECIMEN MUCOUS 40CC (MISCELLANEOUS) ×2 IMPLANT
TRAY FOLEY CATH 14FRSI W/METER (CATHETERS) ×2 IMPLANT
WATER STERILE IRR 1000ML POUR (IV SOLUTION) ×2 IMPLANT

## 2011-12-30 NOTE — Interval H&P Note (Signed)
History and Physical Interval Note:  12/30/2011 12:41 PM  [name]  has presented today for surgery, with the diagnosis of Cervical stenosis, spondylosis, Cervical degenerative disc disease, Cervicalgia  The various methods of treatment have been discussed with the patient and family. After consideration of risks, benefits and other options for treatment, the patient has consented to  Procedure(s) (LRB): ANTERIOR CERVICAL DECOMPRESSION/DISCECTOMY FUSION 4 LEVELS (N/A) as a surgical intervention .  The patients' history has been reviewed, patient examined, no change in status, stable for surgery.  I have reviewed the patients' chart and labs.  Questions were answered to the patient's satisfaction.     Ovidio Steele D  Date of Initial H&P: 12/20/2011  History reviewed, patient examined, no change in status, stable for surgery.

## 2011-12-30 NOTE — Progress Notes (Signed)
Patient ID: [name], female   DOB: 01/25/1951, 61 y.o.   MRN: [id number] BP 141/73  Pulse 62  Temp(Src) 98.3 F (36.8 C) (Oral)  Resp 16  SpO2 98% Called to see patient with weakness in right upper extremity.  On exam profound weakness in right deltoid 1/5, biceps 3/5, triceps 5/5, wrist extensors 5/5 Left side normal strength. Wound flat, not tense Voice is strong, no stridor.  Will order ct cspine to assess hardware.  Will give decadron.

## 2011-12-30 NOTE — Progress Notes (Signed)
I came to see patient and reviewed CT scan.  As per Dr. Sueanne Margarita examination, the patient has evidence of a right C5 radiculopathy with profound right deltoid weakness and right biceps weakness to a lesser degree.  She otherwise has normal strength in both upper and lower extremities and says that her left arm pain is better.  Her wound is flat and she has a normal voice.  Her CT scan was also reviewed.  This shows well positioned hardware at each level.  There does appear to be persistent foraminal stenosis on the right at the C4/5 level as well as milder stenosis at C5/6 on the right.  The left side looks to be considerably improved.  Plate and screws are well positioned.  There does not appear to be any residual canal stenosis at any of the operated levels.  I explained to the patient that her C5 weakness is likely the result of trauma to the nerve due to surgery and that this a known complication of this surgery.  I told her that we could take her back to and more aggressively decompress the right C5 nerve, but I am not at all certain that this would lead to improvement in her right C5 nerve root function.  Instead I have advised her that we will give her steroids and see how she does with some time.  The far lateral spurring would be difficult to completely remove with anterior surgery and, if she does not improve, I have told her that, rather than revising her anterior multilevel fusion, that I would take her back to surgery and do wide posterior foraminotomies at C 4/5 and C 5/6 on the right.  [name] is understandably upset with this situation, but understands my reasoning and recommendations.

## 2011-12-30 NOTE — Progress Notes (Signed)
While doing assessment pt was not able to lift her right hand(normal one), has good grip and normal sensation. Left hand(little weak prior to surgery) is having all purposeful movements. Incision is clean, no swelling , sound is clear and no difficulty for swallowing. Informed the doctor on call.  Johny RN. 12/30/11

## 2011-12-30 NOTE — Anesthesia Postprocedure Evaluation (Signed)
  Anesthesia Post-op Note  Patient: [name]  Procedure(s) Performed: Procedure(s) (LRB): ANTERIOR CERVICAL DECOMPRESSION/DISCECTOMY FUSION 4 LEVELS (N/A)  Patient Location: PACU  Anesthesia Type: General  Level of Consciousness: awake and alert   Airway and Oxygen Therapy: Patient Spontanous Breathing and Patient connected to nasal cannula oxygen  Post-op Pain: mild  Post-op Assessment: Post-op Vital signs reviewed, Patient's Cardiovascular Status Stable, Respiratory Function Stable, Patent Airway and No signs of Nausea or vomiting  Post-op Vital Signs: Reviewed and stable  Complications: No apparent anesthesia complications

## 2011-12-30 NOTE — Preoperative (Signed)
Beta Blockers   Reason not to administer Beta Blockers:Not Applicable 

## 2011-12-30 NOTE — Anesthesia Preprocedure Evaluation (Addendum)
Anesthesia Evaluation  Patient identified by MRN, date of birth, ID band Patient awake    Reviewed: Allergy & Precautions, H&P , NPO status , Patient's Chart, lab work & pertinent test results  Airway Mallampati: II TM Distance: >3 FB     Dental  (+) Teeth Intact   Pulmonary Current Smoker,  Pt verbalizes she still smokes breath sounds clear to auscultation  Pulmonary exam normal       Cardiovascular hypertension, Pt. on medications Rhythm:Regular Rate:Normal     Neuro/Psych PSYCHIATRIC DISORDERS Anxiety Depression Pt Cervical stenosis X 4 levels.  Hx thoracic Fusion.  Left sided arm pain/numbness, weakness  Neuromuscular disease    GI/Hepatic negative GI ROS, Neg liver ROS,   Endo/Other  Morbid obesity  Renal/GU      Musculoskeletal  (+) Arthritis -, Osteoarthritis,    Abdominal   Peds  Hematology   Anesthesia Other Findings Pt with hairline fractures on upper central & lateral incisors.  Dental advisory given.  Reproductive/Obstetrics                        Anesthesia Physical Anesthesia Plan  ASA: III  Anesthesia Plan: General   Post-op Pain Management:    Induction: Intravenous  Airway Management Planned: Oral ETT  Additional Equipment:   Intra-op Plan:   Post-operative Plan: Extubation in OR  Informed Consent:   Dental advisory given  Plan Discussed with:   Anesthesia Plan Comments: (htn Smoker Cervical spondylosis  Plan GA  Kipp Brood)       Anesthesia Quick Evaluation

## 2011-12-30 NOTE — Op Note (Signed)
12/30/2011  3:51 PM  PATIENT:  [name]  61 y.o. female  PRE-OPERATIVE DIAGNOSIS:  Cervical stenosis, spondylosis, Cervical degenerative disc disease, Cervicalgia C3/4, C4/5, C5/6, C6/7  POST-OPERATIVE DIAGNOSIS:  Cervical stenosis, spondylosis, Cervical degenerative disc disease, Cervicalgia C3/4, C4/5, C5/6, C6/7  PROCEDURE:  Procedure(s) (LRB): ANTERIOR CERVICAL DECOMPRESSION/DISCECTOMY FUSION 4 LEVELS with bone allograft and autograft and anterior cervical plate(N/A)  SURGEON:  Surgeon(s) and Role:    * Maeola Harman, MD - Primary    * Tia Alert, MD - Assisting  PHYSICIAN ASSISTANT:   ASSISTANTS: Poteat, RN   ANESTHESIA:   general  EBL:  Total I/O In: 2450 [I.V.:2450] Out: 210 [Urine:110; Blood:100]  BLOOD ADMINISTERED:none  DRAINS: (10) Jackson-Pratt drain(s) with closed bulb suction in the prevertebral space   LOCAL MEDICATIONS USED:  LIDOCAINE   SPECIMEN:  No Specimen  DISPOSITION OF SPECIMEN:  N/A  COUNTS:  YES  TOURNIQUET:  * No tourniquets in log *   DICTATION: Patient was brought to operating room and following the smooth and uncomplicated induction of general endotracheal anesthesia her head was placed on a horseshoe head holder she was placed in 5 pounds of Holter traction and her anterior neck was prepped and draped in usual sterile fashion. An incision was made on the left side of midline after infiltrating the skin and subcutaneous tissues with local lidocaine. The platysmal layer was incised and subplatysmal dissection was performed exposing the anterior border sternocleidomastoid muscle. Using blunt dissection the carotid sheath was kept lateral and trachea and esophagus kept medial exposing the anterior cervical spine. A bent spinal needle was placed it was felt to be the C 3/4 and C 4/5 levels and this was confirmed on intraoperative x-ray. Longus coli muscles were taken down from the anterior cervical spine using electrocautery and key elevator  and self-retaining retractor was placed. The interspace at C 3/4 was incised and a thorough discectomy was performed. Distraction pins were placed. Uncinate spurs and central spondylitic ridges were drilled down with a high-speed drill. The spinal cord dura and both C4 nerve roots were widely decompressed. Hemostasis was assured. After trial sizing a lordotic 7 mm allograft bone wedge with morselized autograft  was selected . The graft was tamped into position and countersunk appropriately. The retractor was moved and the interspace at C 4/5 was incised and a thorough discectomy was performed. Distraction pins were placed. Uncinate spurs and central spondylitic ridges were drilled down with a high-speed drill. The spinal cord dura and both C5 nerve roots were widely decompressed. Hemostasis was assured. A similarly sized graft was selected. The graft was tamped into position and countersunk appropriately.The interspace at C6/7 was incised and a thorough discectomy was performed. Distraction pins were placed. Uncinate spurs and central spondylitic ridges were drilled down with a high-speed drill. The spinal cord dura and both C7 nerve roots were widely decompressed.Hemostasis was assured. A similarly sized graft was selected. The graft was tamped into position and countersunk appropriately.  The interspace at C5/6 was incised and a thorough discectomy was performed. Distraction pins were placed. Uncinate spurs and central spondylitic ridges were drilled down with a high-speed drill. The spinal cord dura and both C6 nerve roots were widely decompressed. A large osteophyte was removed on the right with decompression of the right C6 nerve root. Hemostasis was assured. A similarly sized graft was selected. The graft was tamped into position and countersunk appropriately. Distraction weight was removed. A 64 mm trestle luxe anterior cervical plate was  affixed to the cervical spine with 12 mm variable-angle screws 2 at C3,  2 at C4, 2 at C5, 2 at C6,  and 2 at C7. All screws were well-positioned and locking mechanisms were engaged. Soft tissues were inspected and found to be in good repair. The wound was irrigated. A final x-ray was obtained with good visualization at C3 with only the uppermost  interbody graft well visualized. A #10 JP drain was inserted through a separate stab incision.The platysma layer was closed with 3-0 Vicryl stitches and the skin was reapproximated with 3-0 Vicryl subcuticular stitches. The wound was dressed with Dermabond. Counts were correct at the end of the case. Patient was extubated and taken to recovery in stable and satisfactory condition.    PLAN OF CARE: Admit to inpatient   PATIENT DISPOSITION:  PACU - hemodynamically stable.   Delay start of Pharmacological VTE agent (>24hrs) due to surgical blood loss or risk of bleeding: yes

## 2011-12-30 NOTE — Anesthesia Procedure Notes (Signed)
Procedure Name: Intubation Date/Time: 12/30/2011 12:58 PM Performed by: Tyrone Nine Pre-anesthesia Checklist: Emergency Drugs available, Suction available, Patient being monitored, Patient identified and Timeout performed Patient Re-evaluated:Patient Re-evaluated prior to inductionOxygen Delivery Method: Circle system utilized Preoxygenation: Pre-oxygenation with 100% oxygen Intubation Type: IV induction Ventilation: Mask ventilation without difficulty and Oral airway inserted - appropriate to patient size Laryngoscope Size: Mac and 3 Grade View: Grade II Tube type: Oral Tube size: 7.5 mm Number of attempts: 1 Airway Equipment and Method: Stylet Placement Confirmation: ETT inserted through vocal cords under direct vision and breath sounds checked- equal and bilateral Secured at: 22 cm Tube secured with: Tape Dental Injury: Teeth and Oropharynx as per pre-operative assessment

## 2011-12-30 NOTE — Anesthesia Postprocedure Evaluation (Signed)
  Anesthesia Post-op Note  Patient: [name]  Procedure(s) Performed: Procedure(s) (LRB): ANTERIOR CERVICAL DECOMPRESSION/DISCECTOMY FUSION 4 LEVELS (N/A)  Patient Location: PACU  Anesthesia Type: General  Level of Consciousness: awake, alert  and oriented  Airway and Oxygen Therapy: Patient Spontanous Breathing and Patient connected to nasal cannula oxygen  Post-op Pain: mild  Post-op Assessment: Post-op Vital signs reviewed and Patient's Cardiovascular Status Stable  Post-op Vital Signs: stable  Complications: No apparent anesthesia complications

## 2011-12-30 NOTE — Interval H&P Note (Signed)
History and Physical Interval Note:  12/30/2011 7:45 AM  [name]  has presented today for surgery, with the diagnosis of Cervical stenosis, spondylosis, Cervical degenerative disc disease, Cervicalgia  The various methods of treatment have been discussed with the patient and family. After consideration of risks, benefits and other options for treatment, the patient has consented to  Procedure(s) (LRB): ANTERIOR CERVICAL DECOMPRESSION/DISCECTOMY FUSION 4 LEVELS (N/A) as a surgical intervention .  The patients' history has been reviewed, patient examined, no change in status, stable for surgery.  I have reviewed the patients' chart and labs.  Questions were answered to the patient's satisfaction.     Tiffiney Sparrow D  Date of Initial H&P: 12/20/2011  History reviewed, patient examined, no change in status, stable for surgery.

## 2011-12-30 NOTE — Transfer of Care (Signed)
Immediate Anesthesia Transfer of Care Note  Patient: [name]  Procedure(s) Performed: Procedure(s) (LRB): ANTERIOR CERVICAL DECOMPRESSION/DISCECTOMY FUSION 4 LEVELS (N/A)  Patient Location: PACU  Anesthesia Type: General  Level of Consciousness: patient cooperative and responds to stimulation  Airway & Oxygen Therapy: Patient Spontanous Breathing and Patient connected to face mask oxygen  Post-op Assessment: Report given to PACU RN, Post -op Vital signs reviewed and stable and Patient moving all extremities X 4  Post vital signs: Reviewed and stable  Complications: No apparent anesthesia complications

## 2011-12-31 MED ORDER — PANTOPRAZOLE SODIUM 40 MG PO TBEC
40.0000 mg | DELAYED_RELEASE_TABLET | Freq: Every day | ORAL | Status: DC
  Administered 2011-12-31: 40 mg via ORAL
  Filled 2011-12-31: qty 1

## 2011-12-31 MED ORDER — SODIUM CHLORIDE 0.9 % IV SOLN: 250.0000 mL | INTRAVENOUS | Status: DC

## 2011-12-31 MED ORDER — ONDANSETRON HCL 4 MG/2ML IJ SOLN: 4.0000 mg | INTRAMUSCULAR | Status: DC | PRN

## 2011-12-31 MED ORDER — MENTHOL 3 MG MT LOZG: 1.0000 | LOZENGE | OROMUCOSAL | Status: DC | PRN

## 2011-12-31 MED ORDER — PANTOPRAZOLE SODIUM 40 MG IV SOLR
40.0000 mg | Freq: Every day | INTRAVENOUS | Status: DC
  Filled 2011-12-31: qty 40

## 2011-12-31 MED ORDER — METHOCARBAMOL 500 MG PO TABS
500.0000 mg | ORAL_TABLET | Freq: Three times a day (TID) | ORAL | Status: DC | PRN
  Administered 2012-01-01: 500 mg via ORAL
  Filled 2011-12-31: qty 1

## 2011-12-31 MED ORDER — ACETAMINOPHEN 650 MG RE SUPP: 650.0000 mg | RECTAL | Status: DC | PRN

## 2011-12-31 MED ORDER — CEFAZOLIN SODIUM 1-5 GM-% IV SOLN
1.0000 g | Freq: Three times a day (TID) | INTRAVENOUS | Status: AC
  Administered 2011-12-31 (×2): 1 g via INTRAVENOUS
  Filled 2011-12-31 (×2): qty 50

## 2011-12-31 MED ORDER — DEXAMETHASONE SODIUM PHOSPHATE 10 MG/ML IJ SOLN
6.0000 mg | Freq: Four times a day (QID) | INTRAMUSCULAR | Status: DC
  Administered 2011-12-31 – 2012-01-01 (×5): 6 mg via INTRAVENOUS
  Filled 2011-12-31 (×9): qty 0.6

## 2011-12-31 MED ORDER — PHENOL 1.4 % MT LIQD: 1.0000 | OROMUCOSAL | Status: DC | PRN

## 2011-12-31 MED ORDER — SODIUM CHLORIDE 0.9 % IJ SOLN: 3.0000 mL | INTRAMUSCULAR | Status: DC | PRN

## 2011-12-31 MED ORDER — SODIUM CHLORIDE 0.9 % IJ SOLN: 3.0000 mL | Freq: Two times a day (BID) | INTRAMUSCULAR | Status: DC

## 2011-12-31 MED ORDER — ALPRAZOLAM 0.5 MG PO TABS
0.5000 mg | ORAL_TABLET | Freq: Three times a day (TID) | ORAL | Status: DC | PRN
  Administered 2011-12-31 (×2): 0.5 mg via ORAL
  Filled 2011-12-31 (×2): qty 1

## 2011-12-31 MED ORDER — ACETAMINOPHEN 325 MG PO TABS: 650.0000 mg | ORAL_TABLET | ORAL | Status: DC | PRN

## 2011-12-31 NOTE — Progress Notes (Signed)
Patient ID: [name], female   DOB: [birth year], 61 y.o.   MRN: [id number] Pt alert, talking with son & daughter. Decreased anxiety, though still anxious. JP drained ~276ml last pm. Pt understands plan to leave drain today & reassess tomorrow. Drsg replaced with 2x2 over drain site for comfort. Dermabond along incision intact. No erythema, swelling, or drainage. No tenderness at neck or clavicle. Hard Collar repositioned. Wrist extensors strong and equal bilaterally, as are hand intrinsics. Right deltoid weakness remains profound, with some right biceps weakness. Triceps is strong bilaterally.  Spoke with patient, son, and sister at length. All verbalize understanding that the suspicion is an irritated Right C5 nerve root; and that therapy and oral steroids should help.   Georgiann Cocker RN, BSN

## 2011-12-31 NOTE — Progress Notes (Signed)
Utilization review completed. Anette Guarneri, RN, BSN.  12/31/11

## 2011-12-31 NOTE — Progress Notes (Signed)
Brief Nutrition Note: Chart reviewed. Pt identified on nutrition risk report for wt loss. Per pt she has had some wt loss due to worsening pain over the last 3-4 weeks. Pain is getting better and appetite has returned. Pt consumed 100% of Breakfast this am.  No further nutrition intervention at this time. Please re-consult as needed. Kendell Bane Cornelison [phone]

## 2011-12-31 NOTE — Progress Notes (Signed)
Occupational Therapy Evaluation Patient Details Name: [name] MRN: [id number] DOB: 09/03/1950 Today's Date: 12/31/2011 Time: 4098-1191 OT Time Calculation (min): 46 min  OT Assessment / Plan / Recommendation Clinical Impression  Pt s/p ACDF (4 levels).  Prior to surgery, pt experiencing left hand weakness.  Currently, pt's left hand has significantly improved. However,  pt now with decreased functional use of RUE likely due to C5 nerve root trauma during surgery.  Will benefit from acute OT to address below problem list. Recommending outpatient OT as well as 24/7 supervision/assist at home.      OT Assessment  Patient needs continued OT Services    Follow Up Recommendations  Outpatient OT;Supervision/Assistance - 24 hour    Barriers to Discharge      Equipment Recommendations  None recommended by OT    Recommendations for Other Services    Frequency  Min 2X/week    Precautions / Restrictions Precautions Precautions: Cervical Precaution Comments: pt educated on precautions and given handout Required Braces or Orthoses: Cervical Brace Cervical Brace: Hard collar;Applied in supine position Restrictions Weight Bearing Restrictions: No   Pertinent Vitals/Pain 6/10 neck pain throughout session    ADL  Eating/Feeding: Performed;Modified independent Where Assessed - Eating/Feeding: Chair Lower Body Bathing: Simulated;Minimal assistance Where Assessed - Lower Body Bathing: Unsupported sitting Lower Body Dressing: Performed;Minimal assistance Where Assessed - Lower Body Dressing: Unsupported sitting Toilet Transfer: Performed;Minimal assistance Toilet Transfer Method:  (ambulating) Toilet Transfer Equipment: Raised toilet seat with arms (or 3-in-1 over toilet) Equipment Used: Gait belt Transfers/Ambulation Related to ADLs: Pt ambulated with intermittent min assist for occasional sway to right side.  Pt able to correct balance.   ADL Comments: Pt able to open containers on  meal tray by stabilizing with right hand and opening with left hand.  Pt able to cut food and feed herself with left hand, however left hand with ataxic movements making feeding difficult.  Pt able to gather toilet paper with use of bil. UE in BR and simulated front/back peri care by reaching with LUE to demonstrate ablity to independently perform hygiene tasks.  Recommending use of 3n1 over toilet for pt to use armrest with LUE for support during lift off from commode.  Anticipate pt will have difficulty with grooming ADLs and  various fine motor tasks.    OT Diagnosis: Generalized weakness;Acute pain;Paresis  OT Problem List: Decreased strength;Decreased range of motion;Decreased activity tolerance;Impaired balance (sitting and/or standing);Decreased knowledge of precautions;Impaired UE functional use OT Treatment Interventions: Self-care/ADL training;Therapeutic exercise;Therapeutic activities;Patient/family education;Balance training   OT Goals Acute Rehab OT Goals OT Goal Formulation: With patient Time For Goal Achievement: 01/07/12 Potential to Achieve Goals: Good ADL Goals Additional ADL Goal #1: Pt will perform 3/3 toileting tasks with supervision. ADL Goal: Additional Goal #1 - Progress: Goal set today Arm Goals Additional Arm Goal #1: Pt will independently perform HEP for RUE. Arm Goal: Additional Goal #1 - Progress: Goal set today Miscellaneous OT Goals Miscellaneous OT Goal #1: Pt and caregiver will be independent with don/doff brace. OT Goal: Miscellaneous Goal #1 - Progress: Goal set today  Visit Information  Last OT Received On: 12/31/11 Assistance Needed: +1 PT/OT Co-Evaluation/Treatment: Yes    Subjective Data  Subjective: I'm frustrated! (about not being able to use right arm) Patient Stated Goal: To be able to use right hand.   Prior Functioning  Home Living Lives With: Son Available Help at Discharge: Family;Available 24 hours/day (for first couple days) Type of  Home: House Home Access: Stairs  to enter Entrance Stairs-Number of Steps: 1 Entrance Stairs-Rails: None Home Layout: One level Bathroom Shower/Tub: Forensic scientist: Standard Bathroom Accessibility: Yes How Accessible: Accessible via walker Home Adaptive Equipment: Wheelchair - manual;Walker - rolling;Straight cane;Shower chair with back;Bedside commode/3-in-1;Hand-held shower hose Prior Function Level of Independence: Independent Able to Take Stairs?: Yes Driving: Yes Vocation: Full time employment Communication Communication: No difficulties Dominant Hand: Right    Cognition  Arousal/Alertness: Awake/alert    Extremity/Trunk Assessment Right Upper Extremity Assessment RUE ROM/Strength/Tone: Deficits RUE ROM/Strength/Tone Deficits: Shoulder abduction/flexion- 1/5; elbow flexion 2+/5, Elbow flexion/extension 3-/5; Wrist and grasp- 5/5 RUE Sensation: Deficits RUE Sensation Deficits: Feels numb RUE Coordination: WFL - fine motor;Deficits RUE Coordination Deficits: Gross motor coordination impaired Left Upper Extremity Assessment LUE ROM/Strength/Tone: Within functional levels LUE Sensation: WFL - Light Touch;WFL - Proprioception LUE Coordination: Deficits LUE Coordination Deficits: ataxic movement during functional activity   Mobility Bed Mobility Bed Mobility: Rolling Left;Left Sidelying to Sit;Sitting - Scoot to [employer] of Bed Rolling Left: 4: Min guard Left Sidelying to Sit: 4: Min guard;HOB flat Sitting - Scoot to [employer] of Bed: 5: Supervision Details for Bed Mobility Assistance: Cueing for log roll technique to adhere to cervical precautions Transfers Transfers: Sit to Stand;Stand to Sit Sit to Stand: 5: Supervision;From bed;With armrests;From chair/3-in-1;With upper extremity assist Stand to Sit: 5: Supervision;To chair/3-in-1;With armrests;With upper extremity assist Details for Transfer Assistance: Supervision for safety. Pt uses LUE for support.     Exercise    Balance    End of Session OT - End of Session Equipment Utilized During Treatment: Gait belt;Cervical collar Activity Tolerance: Patient tolerated treatment well Patient left: in chair;with call bell/phone within reach Nurse Communication: Mobility status  12/31/2011 Cipriano Mile OTR/L Pager [phone] Office [phone]  Cipriano Mile 12/31/2011, 5:10 PM

## 2011-12-31 NOTE — Clinical Social Work Psychosocial (Signed)
     Clinical Social Work Department BRIEF PSYCHOSOCIAL ASSESSMENT 12/31/2011  Patient:  [name], [name]     Account Number:  [id number]     Admit date:  12/30/2011  Clinical Social Worker:  Lourdes Sledge  Date/Time:  12/31/2011 04:14 PM  Referred by:  Physician  Date Referred:  12/31/2011 Referred for  SNF Placement   Other Referral:   Interview type:  Patient Other interview type:   CSW also spoke to pt sister and niece who were present in the room.    PSYCHOSOCIAL DATA Living Status:  WITH ADULT CHILDREN Admitted from facility:   Level of care:   Primary support name:  Caryl Never Primary support relationship to patient:  SIBLING Degree of support available:   Pt currently lives with her son who is able to provide care for pt at home. CSW also confirmed with family that pt has a good support system.    CURRENT CONCERNS Current Concerns  Post-Acute Placement   Other Concerns:    SOCIAL WORK ASSESSMENT / PLAN Covering CSW received a referral for SNF placement however pt has not been seen by PT/OT. CSW visited pt room and introduced herself to pt and family. Pt reports walking around the unit today, eating independently and using the bathroom. Pt states she feels find to dc home as she is pretty independent and has an extensive support system.   Assessment/plan status:  No Further Intervention Required Other assessment/ plan:   Information/referral to community resources:    PATIENTS/FAMILYS RESPONSE TO PLAN OF CARE: Pt laying in bed, alert and oriented. Pt reports living with her son who is able to care for her and plans to dc home with HH at dc. CSW informed pt that CSW unsure of pt level of care as PT has not evaluated pt however pt and family confirmed pt would dc home. Family appreciative of CSW visit however have no CSW needs. CSW signing off. CSW also informed RNCM that pt will most likely need HHPT/OT/RN at dc.

## 2011-12-31 NOTE — Evaluation (Signed)
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 Pt will benefit from skilled PT in the acute care setting in order to maximize functional mobility and safety prior to d/c and return to PLOF    PT Assessment  Patient needs continued PT services    Follow Up Recommendations  Outpatient PT;Supervision for mobility/OOB (may not need OPPT)    Barriers to Discharge        lEquipment Recommendations  None recommended by OT;Other (comment) (TBD)    Recommendations for Other Services     Frequency Min 4X/week    Precautions / Restrictions Precautions Precautions: Cervical Precaution Comments: pt educated on precautions and given handout Required Braces or Orthoses: Cervical Brace Cervical Brace: Hard collar;Applied in supine position Restrictions Weight Bearing Restrictions: No         Mobility  Bed Mobility Bed Mobility: Rolling Left;Left Sidelying to Sit;Sitting - Scoot to [place] of Bed Rolling Left: 4: Min guard Left Sidelying to Sit: 4: Min guard;HOB flat Sitting - Scoot to [place] of Bed: 5: Supervision Details for Bed Mobility Assistance: Cueing for log roll technique to adhere to cervical precautions Transfers Transfers: Sit to Stand;Stand to Sit Sit to Stand: 5: Supervision;From bed;With armrests;From chair/3-in-1;With upper extremity assist Stand to Sit: 5: Supervision;To chair/3-in-1;With armrests;With upper extremity assist Details for Transfer Assistance: Supervision for safety. Pt uses LUE for support. Ambulation/Gait Ambulation/Gait Assistance: 4: Min assist Ambulation Distance (Feet): 150 Feet Assistive device: None Ambulation/Gait Assistance Details: VC for upright posture as pt with right lateral sway every 4-5 seconds. Pt able  to correct with min assist for stability Gait Pattern: Lateral trunk lean to right;Trunk flexed;Narrow base of support;Step-to pattern Gait velocity: decreased gait speed    Exercises     PT Diagnosis: Abnormality of gait;Acute pain  PT Problem List: Decreased balance;Decreased activity tolerance;Decreased mobility;Decreased safety awareness;Decreased knowledge of precautions;Pain PT Treatment Interventions: DME instruction;Gait training;Stair training;Functional mobility training;Therapeutic activities;Therapeutic exercise;Balance training;Patient/family education   PT Goals Acute Rehab PT Goals PT Goal Formulation: With patient Time For Goal Achievement: 01/07/12 Potential to Achieve Goals: Good Pt will go Supine/Side to Sit: with modified independence PT Goal: Supine/Side to Sit - Progress: Goal set today Pt will go Sit to Stand: with modified independence PT Goal: Sit to Stand - Progress: Goal set today Pt will go Stand to Sit: with modified independence PT Goal: Stand to Sit - Progress: Goal set today Pt will Ambulate: >150 feet;with modified independence;with least restrictive assistive device PT Goal: Ambulate - Progress: Goal set today Pt will Go Up / Down Stairs: 1-2 stairs;with supervision;with least restrictive assistive device PT Goal: Up/Down Stairs - Progress: Goal set today Additional Goals Additional Goal #1: Pt will score >19 on the DGI Balance test in order to decrease fall risk in the community  PT Goal: Additional Goal #1 - Progress: Goal set today  Visit Information  Assistance Needed: +1    Subjective Data      Prior Functioning  Home Living Lives With: Son Available Help at Discharge: Family;Available 24 hours/day (for first couple days) Type of Home: House Home Access: Stairs to enter [employer] of Steps: 1 Entrance Stairs-Rails: None Home Layout: One level Bathroom Shower/Tub: Forensic scientist: Standard Bathroom  Accessibility: Yes How Accessible: Accessible via  walker Home Adaptive Equipment: Wheelchair - manual;Walker - rolling;Straight cane;Shower chair with back;Bedside commode/3-in-1;Hand-held shower hose Prior Function Level of Independence: Independent Able to Take Stairs?: Yes Driving: Yes Vocation: Full time employment Communication Communication: No difficulties Dominant Hand: Right    Cognition  Arousal/Alertness: Awake/alert    Extremity/Trunk Assessment Right Upper Extremity Assessment RUE ROM/Strength/Tone: Deficits RUE ROM/Strength/Tone Deficits: Shoulder abduction/flexion- 1/5; elbow flexion 2+/5, Elbow flexion/extension 3-/5; Wrist and grasp- 5/5 RUE Sensation: Deficits RUE Sensation Deficits: Feels numb RUE Coordination: WFL - fine motor;Deficits RUE Coordination Deficits: Gross motor coordination impaired Left Upper Extremity Assessment LUE ROM/Strength/Tone: Within functional levels LUE Sensation: WFL - Light Touch;WFL - Proprioception LUE Coordination: Deficits LUE Coordination Deficits: ataxic movement during functional activity Right Lower Extremity Assessment RLE ROM/Strength/Tone: Within functional levels RLE Sensation: WFL - Light Touch Left Lower Extremity Assessment LLE ROM/Strength/Tone: Within functional levels LLE Sensation: WFL - Light Touch   Balance Balance Balance Assessed: Yes Dynamic Standing Balance Dynamic Standing - Balance Support: No upper extremity supported;During functional activity Dynamic Standing - Level of Assistance: 4: Min assist Dynamic Standing - Comments: during ambulation, r sway and balance loss throughout ambulation Standardized Balance Assessment Standardized Balance Assessment: Dynamic Gait Index  End of Session PT - End of Session Equipment Utilized During Treatment: Gait belt;Cervical collar Activity Tolerance: Patient tolerated treatment well Patient left: with call bell/phone within reach;in chair Nurse Communication:  Mobility status   Milana Kidney 12/31/2011, 5:15 PM  12/31/2011 Milana Kidney DPT PAGER: [phone] OFFICE: [phone]

## 2011-12-31 NOTE — Progress Notes (Signed)
Subjective: Patient reports "i can't do anything with this arm."  Objective: Vital signs in last 24 hours: Temp:  [97 F (36.1 C)-98.6 F (37 C)] 97 F (36.1 C) (05/17 0514) Pulse Rate:  [58-82] 67  (05/17 0514) Resp:  [16-31] 18  (05/17 0514) BP: (98-156)/(55-75) 98/55 mmHg (05/17 0514) SpO2:  [97 %-100 %] 98 % (05/17 0514)  Intake/Output from previous day: 05/16 0701 - 05/17 0700 In: 3353.8 [I.V.:3253.8; IV Piggyback:100] Out: 2160 [Urine:1935; Drains:125; Blood:100] Intake/Output this shift:    Alert, tearful with conversation about right arm weakness. Right deltoid strength remains poor. Pt verbalizes understanding of plan to continue steroid to decrease inflammation at nerve, PT/OT to assist, and expect very gradual improvement. Pt very anxious about weeks ahead and her ability to return to work. Drsg intact, without drainage. Collar in use. Good strength LUE & BLE. No report of pain this am.    Lab Results:  Basename 12/30/11 0833  WBC 17.5*  HGB 15.5*  HCT 44.0  PLT 311   BMET No results found for this basename: NA:2,K:2,CL:2,CO2:2,GLUCOSE:2,BUN:2,CREATININE:2,CALCIUM:2 in the last 72 hours  Studies/Results: Dg Cervical Spine 2-3 Views  12/30/2011  *RADIOLOGY REPORT*  Clinical Data: Neck pain  CERVICAL SPINE - 2-3 VIEW  Comparison: MRI 12/12/2011  Findings: Film #1 demonstrates needles at C3-4 and C4-5.  Film #2 demonstrates the upper aspect of what is reportedly a C3-C7 fusion. Lower aspect of the fusion is not visualized on this film.  IMPRESSION: As above.  Original Report Authenticated By: Elsie Stain, M.D.   Ct Cervical Spine Wo Contrast  12/30/2011  *RADIOLOGY REPORT*  Clinical Data: Postoperative weakness of the right arm status post C3-C7 ACDF.  CT CERVICAL SPINE WITHOUT CONTRAST  Technique:  Multidetector CT imaging of the cervical spine was performed. Multiplanar CT image reconstructions were also generated.  Comparison: MRI cervical spine 12/12/2011.   Plain films earlier today.  Findings: The patient is status post C3-C7 ACDF with anterior plating.  The hardware is intact and appropriately placed. Interbody bone plugs are satisfactory positioned. Surgical drain good position along the left side of the fusion plate.  Within limits of detection by CT, no intraspinal hematoma is observed.  Slight air within the interspace reflects prior discectomy and high speed drill decompression.  The individual disc spaces were examined as follows:  C2-3:  Moderate facet arthropathy is unchanged.  No neural compression.  C3-4:  Moderate left and mild right facet arthropathy. Satisfactory postoperative appearance.  Slight left neural foraminal narrowing.  C4-5:  Improved central canal stenosis.  Hardware good position. Bone plug appropriately placed.  Persistent right neural foraminal narrowing due to far lateral uncinate spurring. (Image 32 - 34, series 2). Persistent right C5 nerve root encroachment is likely.  C5-6:  Improved central canal stenosis. Hardware good position. Bone plug appropriately placed.  Persistent right neural foraminal narrowing due to far lateral uncinate spurring (images 39 - 41, series 2).  Persistent right C6 nerve root encroachment is likely. Prior posterior laminectomy on the right for  far lateral discectomy.  C6-7:  Hardware good position.  Bone plug appropriately placed. Improved canal stenosis.  Residual neural foraminal narrowing on the left due to uncinate spurring potentially affects the C7 nerve root.  C7-T1:  Unremarkable.  3-D reformations were generated and confirm neural foraminal narrowing particularly at C4-5 on the right.  IMPRESSION:  C3-C7 fusion as described.  Hardware intact with bone plugs appropriately placed.  Residual uncinate spurring at C4-5 and C5-6 on  the right with persistent foraminal narrowing could affect the right C5 and C6 nerve roots respectively.  Correlate clinically.  Original Report Authenticated By: Elsie Stain, M.D.    Assessment/Plan: Stable  LOS: 1 day  Per dr. Venetia Maxon, will continue Decadron, PT & OT to assist with RUE rehab.   Georgiann Cocker 12/31/2011, 8:18 AM

## 2012-01-01 MED ORDER — METHOCARBAMOL 500 MG PO TABS: 500.0000 mg | ORAL_TABLET | Freq: Three times a day (TID) | ORAL | Status: AC | PRN

## 2012-01-01 MED ORDER — HYDROCODONE-ACETAMINOPHEN 10-325 MG PO TABS: 1.0000 | ORAL_TABLET | Freq: Four times a day (QID) | ORAL | Status: DC | PRN

## 2012-01-01 NOTE — Discharge Summary (Signed)
Physician Discharge Summary  Patient ID: [name] MRN: [id number] DOB/AGE: 02/11/1951 61 y.o.  Admit date: 12/30/2011 Discharge date: 01/01/2012  Admission Diagnoses:  Discharge Diagnoses:  Active Problems:  * No active hospital problems. *    Discharged Condition: good  Hospital Course: Patient mid-to the hospital where she underwent a 4 level anterior cervical decompression and fusion. Postoperatively she awakened with new right deltoid and biceps weakness. Workup consistent with a postoperative c5 radiculopathy. Symptoms somewhat better on her second postoperative day. Patient having no pain. Her major motor examination is much improved. Wound clean dry and intact. Plan is for discharge home.  Consults:   Significant Diagnostic Studies:   Treatments:   Discharge Exam: Blood pressure 120/72, pulse 64, temperature 98.5 F (36.9 C), temperature source Oral, resp. rate 18, height 5\' 5"  (1.651 m), weight 97.297 kg (214 lb 8 oz), SpO2 95.00%. Patient awake alert oriented and appropriate. Cranial nerve function is intact. Motor examination reveals weakness for right-sided deltoid supraspinous and infraspinous all grating out at 2/5. She has weakness of her right-sided biceps grating at 3/5. Remainder of motor shrink intact. Wound clean dry and intact. Chest and abdomen benign.  Disposition: 01-Home or Self Care   Medication List  As of 01/01/2012  9:29 AM   TAKE these medications         glucosamine-chondroitin 500-400 MG tablet   Take 1 tablet by mouth daily.      HYDROcodone-acetaminophen 10-325 MG per tablet   Commonly known as: NORCO   Take 1-2 tablets by mouth every 6 (six) hours as needed for pain. For pain      methocarbamol 500 MG tablet   Commonly known as: ROBAXIN   Take 1 tablet (500 mg total) by mouth every 8 (eight) hours as needed.      multivitamin with minerals tablet   Take 1 tablet by mouth daily.      OYSTER SHELL CALCIUM PO   Take 1 tablet by  mouth daily.      simvastatin 40 MG tablet   Commonly known as: ZOCOR   Take 40 mg by mouth every evening.      SLOW FE PO   Take 1 tablet by mouth daily.      telmisartan 40 MG tablet   Commonly known as: MICARDIS   Take 40 mg by mouth daily.      VITAMIN C PO   Take 1 tablet by mouth.      Vitamin D (Ergocalciferol) 50000 UNITS Caps   Commonly known as: DRISDOL   Take 50,000 Units by mouth every 7 (seven) days.           Follow-up Information    Follow up with STERN,JOSEPH D, MD. Call in 1 week.   Contact information:   [address], [address] [place] [address] [phone]          Signed: Temple Pacini 01/01/2012, 9:29 AM

## 2012-01-01 NOTE — Progress Notes (Signed)
Occupational Therapy Treatment Patient Details Name: [name] MRN: [id number] DOB: 03-16-1951 Today's Date: 01/01/2012 Time: 4098-1191 OT Time Calculation (min): 19 min  OT Assessment / Plan / Recommendation Comments on Treatment Session Focus of treatment session on RUE HEP.  No change in RUE strength but pt demonstrating compensation with shoulder movements.  Educated pt to hold RUE by wrist rather than pulling by thumb as she was doing when OT arrived.  Supplied pt with weighted fork since pt reporting continued difficulty holding utensils in left hand due to tremors.  Pt also requesting HHOT instead of OPOT.  Will update recommendation.    Follow Up Recommendations  Home health OT    Barriers to Discharge       Equipment Recommendations  None recommended by OT    Recommendations for Other Services    Frequency Min 2X/week   Plan Discharge plan needs to be updated    Precautions / Restrictions Precautions Precautions: Cervical Precaution Comments: pt able to maintain precautions during ambulation Required Braces or Orthoses: Cervical Brace Cervical Brace: Hard collar;Applied in supine position Restrictions Weight Bearing Restrictions: No   Pertinent Vitals/Pain N/A    ADL       OT Diagnosis:    OT Problem List:   OT Treatment Interventions:     OT Goals Arm Goals Additional Arm Goal #1: Pt will independently perform HEP for RUE. Arm Goal: Additional Goal #1 - Progress: Progressing toward goals  Visit Information  Last OT Received On: 01/01/12 Assistance Needed: +1    Subjective Data      Prior Functioning       Cognition  Overall Cognitive Status: Appears within functional limits for tasks assessed/performed Arousal/Alertness: Awake/alert Orientation Level: Appears intact for tasks assessed Behavior During Session: [facility] for tasks performed    Mobility     Exercises Other Exercises Other Exercises: Pt performed RUE A/AROM internal/external  rotation. Other Exercises: Pt perfromed 10x A/AROM RUE elbow flexion/extension. Other Exercises: Pt performed 10x A/AROM table slides. Other Exercises: Pt performed 10x bil. shoulder shrugs.  Balance    End of Session OT - End of Session Equipment Utilized During Treatment: Cervical collar Activity Tolerance: Patient tolerated treatment well Patient left: in chair;with call bell/phone within reach Nurse Communication: Mobility status  01/01/2012 Cipriano Mile OTR/L Pager [phone] Office [phone]  Cipriano Mile 01/01/2012, 1:12 PM

## 2012-01-01 NOTE — Progress Notes (Signed)
Physical Therapy Treatment Patient Details Name: [name] MRN: [id number] DOB: February 17, 1951 Today's Date: 01/01/2012 Time: 4098-1191 PT Time Calculation (min): 23 min  PT Assessment / Plan / Recommendation Comments on Treatment Session  Pt progressing well, minimal balance deficits noted today. Will continue PT in the acute care setting in order to increase safety and balance prior to d/c. No need for OPPT.    Follow Up Recommendations  No PT follow up;Supervision for mobility/OOB    Barriers to Discharge        Equipment Recommendations  None recommended by PT;None recommended by OT    Recommendations for Other Services    Frequency Min 4X/week   Plan Discharge plan needs to be updated;Frequency remains appropriate    Precautions / Restrictions Precautions Precautions: Cervical Precaution Comments: pt able to maintain precautions during ambulation Required Braces or Orthoses: Cervical Brace Cervical Brace: Hard collar;Applied in supine position Restrictions Weight Bearing Restrictions: No       Mobility  Bed Mobility Bed Mobility: Rolling Left;Left Sidelying to Sit;Sitting - Scoot to [place] of Bed Rolling Left: 6: Modified independent (Device/Increase time) Left Sidelying to Sit: 6: Modified independent (Device/Increase time) Sitting - Scoot to Edge of Bed: 6: Modified independent (Device/Increase time) Transfers Transfers: Sit to Stand;Stand to Sit Sit to Stand: 5: Supervision Stand to Sit: 5: Supervision Details for Transfer Assistance: VC for safety with cervical precautions and stability upon standing Ambulation/Gait Ambulation/Gait Assistance: 4: Min guard Ambulation Distance (Feet): 200 Feet Assistive device: None Ambulation/Gait Assistance Details: Min guard for supervision and safety as pt with minimal balance losses in which she was able to correct Gait Pattern: Trunk flexed;Narrow base of support;Step-to pattern Gait velocity: decreased gait speed      Exercises     PT Diagnosis:    PT Problem List:   PT Treatment Interventions:     PT Goals Acute Rehab PT Goals PT Goal Formulation: With patient PT Goal: Supine/Side to Sit - Progress: Met PT Goal: Sit to Stand - Progress: Progressing toward goal PT Goal: Stand to Sit - Progress: Progressing toward goal PT Goal: Ambulate - Progress: Progressing toward goal PT Goal: Up/Down Stairs - Progress: Met Additional Goals PT Goal: Additional Goal #1 - Progress: Met  Visit Information  Last PT Received On: 01/01/12 Assistance Needed: +1    Subjective Data      Cognition  Overall Cognitive Status: Appears within functional limits for tasks assessed/performed Arousal/Alertness: Awake/alert Orientation Level: Appears intact for tasks assessed Behavior During Session: [facility] for tasks performed    Balance  Balance Balance Assessed: Yes Dynamic Gait Index Level Surface: Mild Impairment Change in Gait Speed: Mild Impairment Gait with Horizontal Head Turns: Normal (had to turn whole body due to cervical precautions) Gait with Vertical Head Turns: Normal (had to turn whole body due to cervical precautions) Gait and Pivot Turn: Mild Impairment Step Over Obstacle: Normal Step Around Obstacles: Normal Steps: Normal Total Score: 21   End of Session PT - End of Session Equipment Utilized During Treatment: Gait belt;Cervical collar Activity Tolerance: Patient tolerated treatment well Patient left: with call bell/phone within reach;in chair Nurse Communication: Mobility status    Milana Kidney 01/01/2012, 8:41 AM  01/01/2012 Milana Kidney DPT PAGER: [phone] OFFICE: [phone]

## 2012-02-24 ENCOUNTER — Ambulatory Visit: Payer: 59 | Attending: Neurosurgery | Admitting: Physical Therapy

## 2012-02-24 DIAGNOSIS — M2569 Stiffness of other specified joint, not elsewhere classified: Secondary | ICD-10-CM | POA: Insufficient documentation

## 2012-02-24 DIAGNOSIS — IMO0001 Reserved for inherently not codable concepts without codable children: Secondary | ICD-10-CM | POA: Insufficient documentation

## 2012-02-24 DIAGNOSIS — M542 Cervicalgia: Secondary | ICD-10-CM | POA: Insufficient documentation

## 2012-02-24 DIAGNOSIS — M6281 Muscle weakness (generalized): Secondary | ICD-10-CM | POA: Insufficient documentation

## 2012-02-29 ENCOUNTER — Ambulatory Visit: Payer: 59 | Admitting: Physical Therapy

## 2012-03-02 ENCOUNTER — Ambulatory Visit: Payer: 59 | Admitting: Physical Therapy

## 2012-03-07 ENCOUNTER — Ambulatory Visit: Payer: 59 | Admitting: Physical Therapy

## 2012-03-09 ENCOUNTER — Ambulatory Visit: Payer: 59 | Admitting: Physical Therapy

## 2012-03-14 ENCOUNTER — Ambulatory Visit: Payer: 59 | Admitting: Physical Therapy

## 2012-03-16 ENCOUNTER — Ambulatory Visit: Payer: 59 | Attending: Neurosurgery | Admitting: Physical Therapy

## 2012-03-16 DIAGNOSIS — IMO0001 Reserved for inherently not codable concepts without codable children: Secondary | ICD-10-CM | POA: Insufficient documentation

## 2012-03-16 DIAGNOSIS — M2569 Stiffness of other specified joint, not elsewhere classified: Secondary | ICD-10-CM | POA: Insufficient documentation

## 2012-03-16 DIAGNOSIS — M6281 Muscle weakness (generalized): Secondary | ICD-10-CM | POA: Insufficient documentation

## 2012-03-16 DIAGNOSIS — M542 Cervicalgia: Secondary | ICD-10-CM | POA: Insufficient documentation

## 2012-03-20 ENCOUNTER — Encounter (HOSPITAL_COMMUNITY): Payer: Self-pay | Admitting: Pharmacy Technician

## 2012-03-20 ENCOUNTER — Encounter (HOSPITAL_COMMUNITY): Payer: Self-pay | Admitting: *Deleted

## 2012-03-20 ENCOUNTER — Other Ambulatory Visit: Payer: Self-pay | Admitting: Neurosurgery

## 2012-03-20 NOTE — H&P (Signed)
Delanda Bulluck  #[id number] DOB:  [birth year] 03/20/2012:     Reyne Falconi returns today for recheck and she says she is no better in terms of right arm weakness.  I reviewed her EMG and nerve conduction velocities and also spoke with Dr. Murray Hodgkins about it.  There is electrodiagnostic study of the right upper extremity that is consistent with an acute right C5 radiculopathy.  This is supported by muscle membrane irritability/denervation of the C5 myotomal muscles noted above as well as by right C5 paraspinals.  There does appear to be evidence of lateral sprouting and many motor units evidenced in all the muscles indicating functioning nerve in continuity.  However, Dr. Murray Hodgkins was of the opinion that there appeared to be some ongoing irritation to the right C5 nerve as demonstrated by muscle membrane irritability and denervation and he felt that this would have likely gotten better within two months of the surgery.  She is now 2  months out from surgery and is clearly not improving significantly in terms of strength and at this point I have recommended that we go ahead with a posterior right C4-5 decompression with foraminotomy to totally decompress the right C5 nerve root. She wishes to do this as soon as possible and says that she is out of time off and is very frustrated with her lack of improvement.  She has also noticed that she has started developing some "clunking" in her right shoulder and feels she is not any stronger.  I therefore am scheduling her for surgery on an urgent basis and will perform right C4-5 posterior foraminotomy and decompression and will perform this on 03/21/2012.  We will plan on surgery tomorrow.  Risks and benefits are discussed with the patient.     NEUROSURGICAL CONSULTATION   Affie Gasner   DOB:  [birth year]   #[id number]     HISTORY OF PRESENT ILLNESS:  Elyce Zollinger is a 61 year old woman who works in a full-time clerical position at [employer] who says she does a lot of desk work  getting up and down a lot, who complains of bilateral shoulder pain down to below her deltoids. She says it is aggravating when she moves or raises her arms and is relieved when she lies still. She says she is a lot better than she was at first in February when she was having a considerable amount of pain.  She currently describes her pain level at 4 out of 10 in severity.  She says that her posterior neck is not quite as painful as the pain into both her shoulders. She does not note any numbness and does not note any weakness.  She had Cortisone shots in her shoulders in March which she said helped her a lot and she also has had nerve blocks which did not really help.  She has had physical therapy for a couple visits a few months ago. She has been taking Mobic 15 mg. daily, but says when she took a full tablet it upset her stomach. She has been taking Vicodin and Xanax as well and is taking Darvocet for less severe pain. 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  There is right  C5-6 foraminal stenosis and on closer review it is clear that she has had a previous posterior cervical laminectomy at this level on the right. She has C4-5 uncinate spurring resulting in moderate foraminal stenosis bilaterally, left greater than right.    Mrs. Bosso denies any bowel or bladder dysfunction.  She complains of throbbing and pounding in her neck. She initially thought that she was having heart problems and had an ECG which was negative.  She says this is interfering with the quality of her life.    REVIEW OF SYSTEMS:   A detailed Review of Systems sheet was reviewed with the patient.  Pertinent  positives include under cardiovascular - she has high blood pressure; under gastrointestinal - she notes indigestion or pain with eating.  All other systems are negative; this includes Constitutional symptoms, Eyes, Ears, nose, mouth, throat, Endocrine, Respiratory, Genitourinary, Musculoskeletal, Integumentary & Breast, Neurologic, Psychiatric, Hematologic/Lymphatic, Allergic/Immunologic.    PAST MEDICAL HISTORY:      Prior Operations and Hospitalizations:   Prior surgery consists of a right C5-6 foraminotomy by Dr. Elesa Hacker in 1992, hysterectomy by Dr. Ambrose Mantle in 1995, tumor removed by Dr. Chevis Pretty and Dr. Derrell Lolling and had colon surgery in 2004 by Dr. Lindie Spruce.      Medications and Allergies:  She has no known drug allergies.  Current medications are Protonix daily, Hydrochlorothiazide daily, Mobic daily, Vicodin as needed, Xanax as needed, and Darvocet.      Height and Weight:     She is 5'6" tall, 210 lbs.    FAMILY HISTORY:    Her mother is 63 with arthritis.  Her father is 46 and had a heart attack.    SOCIAL HISTORY:    She is a pack per day smoker. She is a nondrinker of alcoholic beverages.  No history of substance abuse.    PHYSICAL EXAMINATION:      Blood Pressure, Pulse:     Mrs. Bewick's blood pressure is 132/92 in the left arm seated.  Heart rate is 78 and regular.      HEENT - normocephalic, atraumatic.  The pupils are equal, round and reactive to light.  The extraocular muscles are intact.  Sclerae - white.  Conjunctiva - pink.  Oropharynx benign.  Uvula midline.     Neck - there are no masses, meningismus, deformities, tracheal deviation, jugular vein distention or carotid bruits.  She has decreased range of motion of her cervical spine especially with rotation toward the left.  She has a negative Spurlings' maneuver bilaterally. Negative Lhermitte's sign with axial compression.      Respiratory - she has decreased breath sounds at the bases.        Cardiovascular - the heart has  regular rate and rhythm to auscultation.  No murmurs are appreciated.  There is no extremity edema, cyanosis or clubbing.  There are palpable pedal pulses.      Abdomen - obese, soft, nontender, no hepatosplenomegaly appreciated or masses.  There are active bowel sounds.  No guarding or rebound.    Musculoskeletal Examination - she has positive left shoulder impingement testing.  She has a healed posterior cervical incision.      NEUROLOGICAL EXAMINATION: The patient is oriented to time, person and place and has good recall of both recent and remote memory with normal attention span and concentration.  The patient speaks with clear and fluent speech and exhibits normal language function and appropriate fund of knowledge.      Cranial Nerve Examination -  pupils are equal, round and reactive to light.  Extraocular movements are full.  Visual fields are full to confrontational testing.  Facial sensation and facial movement are symmetric and intact.  Hearing is intact to finger rub.  Palate is upgoing.  Shoulder shrug is symmetric.  Tongue protrudes in the midline.      Motor Examination - motor strength is 5/5 in the bilateral deltoids, biceps, triceps, handgrips, wrist extensors, interosseous.  In the lower extremities motor strength is 5/5 in hip flexion, extension, quadriceps, hamstrings, plantar flexion, dorsiflexion and extensor hallucis longus.      Sensory Examination - normal to light touch and pinprick sensation in the upper and lower extremities.     Deep Tendon Reflexes - trace in the biceps and triceps, and brachioradialis, negative Hoffmann's signs bilaterally, 3 in the knees, 2 in the ankles.  The great toes are downgoing to plantar stimulation.    IMPRESSION AND RECOMMENDATIONS: Eimi Viney is a 61 year old woman with bilateral shoulder and neck pain with some positive shoulder impingement testing on the left. She has had a previous foraminotomy at C5-6 on the right by Dr. Elesa Hacker.  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  On examination today she does not have significant evidence of arm weakness.  I am a bit concerned about her cervical stenosis, but do think that since she does not have any frank weakness I would not recommend any surgery at the present time, also considering that she has three-level disease in her neck which would need to be addressed if we were going to do surgery.  I have recommended that she continue with her exercise program and gave her some samples of Celebrex.  I am going to see her back in three months for followup to make sure she is getting better and then think that I should see her on a six monthly basis to make sure she does not develop any symptoms consistent with a cervical myelopathy.  I discussed these in detail with her and she is aware of what to look for.       [facility]    Danae Orleans. Venetia Maxon, M.D.       Danae Orleans. Venetia Maxon, M.D./gde

## 2012-03-20 NOTE — Progress Notes (Signed)
Pt was instructed by Dr Venetia Maxon to arrive at 1400 and NPO after 0900.

## 2012-03-21 ENCOUNTER — Encounter (HOSPITAL_COMMUNITY): Payer: Self-pay | Admitting: Anesthesiology

## 2012-03-21 ENCOUNTER — Encounter (HOSPITAL_COMMUNITY)
Admission: RE | Disposition: A | Discharge status: Home / Self Care | Payer: Self-pay | Source: Ambulatory Visit | Attending: Neurosurgery

## 2012-03-21 ENCOUNTER — Ambulatory Visit (HOSPITAL_COMMUNITY): Payer: 59 | Admitting: Anesthesiology

## 2012-03-21 ENCOUNTER — Ambulatory Visit (HOSPITAL_COMMUNITY): Payer: 59

## 2012-03-21 ENCOUNTER — Ambulatory Visit (HOSPITAL_COMMUNITY)
Admission: RE | Admit: 2012-03-21 | Discharge: 2012-03-22 | Disposition: A | Discharge status: Home / Self Care | Payer: 59 | Source: Ambulatory Visit | Attending: Neurosurgery | Admitting: Neurosurgery

## 2012-03-21 ENCOUNTER — Encounter (HOSPITAL_COMMUNITY): Payer: Self-pay | Admitting: *Deleted

## 2012-03-21 DIAGNOSIS — I10 Essential (primary) hypertension: Secondary | ICD-10-CM | POA: Insufficient documentation

## 2012-03-21 DIAGNOSIS — F17200 Nicotine dependence, unspecified, uncomplicated: Secondary | ICD-10-CM | POA: Insufficient documentation

## 2012-03-21 DIAGNOSIS — J4489 Other specified chronic obstructive pulmonary disease: Secondary | ICD-10-CM | POA: Insufficient documentation

## 2012-03-21 DIAGNOSIS — J449 Chronic obstructive pulmonary disease, unspecified: Secondary | ICD-10-CM | POA: Insufficient documentation

## 2012-03-21 HISTORY — DX: Family history of other specified conditions: Z84.89

## 2012-03-21 HISTORY — DX: Unspecified osteoarthritis, unspecified site: M19.90

## 2012-03-21 HISTORY — DX: Adverse effect of unspecified anesthetic, initial encounter: T41.45XA

## 2012-03-21 HISTORY — DX: Other complications of anesthesia, initial encounter: T88.59XA

## 2012-03-21 LAB — BASIC METABOLIC PANEL
BUN: 16 mg/dL (ref 6–23)
Calcium: 9.5 mg/dL (ref 8.4–10.5)
Creatinine, Ser: 0.87 mg/dL (ref 0.50–1.10)
GFR calc non Af Amer: 70 mL/min — ABNORMAL LOW (ref >90)
Glucose, Bld: 96 mg/dL (ref 70–99)
Sodium: 143 mEq/L (ref 135–145)

## 2012-03-21 LAB — CBC
HCT: 39.8 % (ref 36.0–46.0)
Hemoglobin: 13.7 g/dL (ref 12.0–15.0)
MCH: 32.5 pg (ref 26.0–34.0)
MCHC: 34.4 g/dL (ref 30.0–36.0)

## 2012-03-21 SURGERY — POSTERIOR CERVICAL LAMINECTOMY WITH MET- RX
Anesthesia: General | Site: Neck | Laterality: Right | Wound class: Clean

## 2012-03-21 MED ORDER — HYDROMORPHONE HCL PF 1 MG/ML IJ SOLN: 0.2500 mg | INTRAMUSCULAR | Status: DC | PRN

## 2012-03-21 MED ORDER — HEMOSTATIC AGENTS (NO CHARGE) OPTIME
TOPICAL | Status: DC | PRN
  Administered 2012-03-21: 1 via TOPICAL

## 2012-03-21 MED ORDER — EPHEDRINE SULFATE 50 MG/ML IJ SOLN
INTRAMUSCULAR | Status: DC | PRN
  Administered 2012-03-21: 10 mg via INTRAVENOUS

## 2012-03-21 MED ORDER — PROMETHAZINE HCL 25 MG/ML IJ SOLN: 6.2500 mg | INTRAMUSCULAR | Status: DC | PRN

## 2012-03-21 MED ORDER — HYDROCODONE-ACETAMINOPHEN 5-325 MG PO TABS
1.0000 | ORAL_TABLET | ORAL | Status: DC | PRN
  Administered 2012-03-22: 2 via ORAL
  Filled 2012-03-21: qty 2

## 2012-03-21 MED ORDER — PHENYLEPHRINE HCL 10 MG/ML IJ SOLN
INTRAMUSCULAR | Status: DC | PRN
  Administered 2012-03-21 (×4): 80 ug via INTRAVENOUS

## 2012-03-21 MED ORDER — HYDROCHLOROTHIAZIDE 12.5 MG PO CAPS
12.5000 mg | ORAL_CAPSULE | Freq: Every day | ORAL | Status: DC
  Filled 2012-03-21: qty 1

## 2012-03-21 MED ORDER — OXYCODONE-ACETAMINOPHEN 5-325 MG PO TABS: 1.0000 | ORAL_TABLET | ORAL | Status: DC | PRN

## 2012-03-21 MED ORDER — FENTANYL CITRATE 0.05 MG/ML IJ SOLN
INTRAMUSCULAR | Status: DC | PRN
  Administered 2012-03-21: 50 ug via INTRAVENOUS

## 2012-03-21 MED ORDER — MUPIROCIN 2 % EX OINT
TOPICAL_OINTMENT | Freq: Two times a day (BID) | CUTANEOUS | Status: DC
  Filled 2012-03-21: qty 22

## 2012-03-21 MED ORDER — IRBESARTAN 75 MG PO TABS
75.0000 mg | ORAL_TABLET | Freq: Every day | ORAL | Status: DC
  Filled 2012-03-21: qty 1

## 2012-03-21 MED ORDER — VITAMIN C 500 MG PO TABS
500.0000 mg | ORAL_TABLET | Freq: Every day | ORAL | Status: DC
  Filled 2012-03-21: qty 1

## 2012-03-21 MED ORDER — SODIUM CHLORIDE 0.9 % IV SOLN: 250.0000 mL | INTRAVENOUS | Status: DC

## 2012-03-21 MED ORDER — SODIUM CHLORIDE 0.9 % IJ SOLN: 3.0000 mL | Freq: Two times a day (BID) | INTRAMUSCULAR | Status: DC

## 2012-03-21 MED ORDER — SODIUM CHLORIDE 0.9 % IJ SOLN: 3.0000 mL | INTRAMUSCULAR | Status: DC | PRN

## 2012-03-21 MED ORDER — MENTHOL 3 MG MT LOZG: 1.0000 | LOZENGE | OROMUCOSAL | Status: DC | PRN

## 2012-03-21 MED ORDER — MUPIROCIN 2 % EX OINT
TOPICAL_OINTMENT | CUTANEOUS | Status: AC
  Filled 2012-03-21: qty 22

## 2012-03-21 MED ORDER — MIDAZOLAM HCL 2 MG/2ML IJ SOLN
1.0000 mg | INTRAMUSCULAR | Status: DC | PRN
  Administered 2012-03-21: 1 mg via INTRAVENOUS

## 2012-03-21 MED ORDER — SODIUM CHLORIDE 0.9 % IR SOLN
Status: DC | PRN
  Administered 2012-03-21: 19:00:00

## 2012-03-21 MED ORDER — MIDAZOLAM HCL 5 MG/5ML IJ SOLN
INTRAMUSCULAR | Status: DC | PRN
  Administered 2012-03-21: 2 mg via INTRAVENOUS

## 2012-03-21 MED ORDER — DOCUSATE SODIUM 100 MG PO CAPS: 100.0000 mg | ORAL_CAPSULE | Freq: Two times a day (BID) | ORAL | Status: DC

## 2012-03-21 MED ORDER — ALUM & MAG HYDROXIDE-SIMETH 200-200-20 MG/5ML PO SUSP: 30.0000 mL | Freq: Four times a day (QID) | ORAL | Status: DC | PRN

## 2012-03-21 MED ORDER — MORPHINE SULFATE 2 MG/ML IJ SOLN
1.0000 mg | INTRAMUSCULAR | Status: DC | PRN
  Administered 2012-03-22: 2 mg via INTRAVENOUS
  Filled 2012-03-21: qty 1

## 2012-03-21 MED ORDER — NEOSTIGMINE METHYLSULFATE 1 MG/ML IJ SOLN
INTRAMUSCULAR | Status: DC | PRN
  Administered 2012-03-21: 3 mg via INTRAVENOUS

## 2012-03-21 MED ORDER — VITAMIN D (ERGOCALCIFEROL) 1.25 MG (50000 UNIT) PO CAPS
50000.0000 [IU] | ORAL_CAPSULE | ORAL | Status: DC
  Filled 2012-03-21: qty 1

## 2012-03-21 MED ORDER — SIMVASTATIN 40 MG PO TABS
40.0000 mg | ORAL_TABLET | Freq: Every evening | ORAL | Status: DC
  Filled 2012-03-21: qty 1

## 2012-03-21 MED ORDER — VALSARTAN-HYDROCHLOROTHIAZIDE 80-12.5 MG PO TABS: 1.0000 | ORAL_TABLET | Freq: Every day | ORAL | Status: DC

## 2012-03-21 MED ORDER — PANTOPRAZOLE SODIUM 40 MG IV SOLR
40.0000 mg | Freq: Every day | INTRAVENOUS | Status: DC
  Administered 2012-03-22: 40 mg via INTRAVENOUS
  Filled 2012-03-21 (×2): qty 40

## 2012-03-21 MED ORDER — FENTANYL CITRATE 0.05 MG/ML IJ SOLN
INTRAMUSCULAR | Status: AC
  Filled 2012-03-21: qty 2

## 2012-03-21 MED ORDER — LACTATED RINGERS IV SOLN
INTRAVENOUS | Status: DC | PRN
  Administered 2012-03-21 (×2): via INTRAVENOUS

## 2012-03-21 MED ORDER — HYDROCODONE-ACETAMINOPHEN 10-325 MG PO TABS: 1.0000 | ORAL_TABLET | Freq: Four times a day (QID) | ORAL | Status: DC | PRN

## 2012-03-21 MED ORDER — PHENOL 1.4 % MT LIQD: 1.0000 | OROMUCOSAL | Status: DC | PRN

## 2012-03-21 MED ORDER — GLYCOPYRROLATE 0.2 MG/ML IJ SOLN
INTRAMUSCULAR | Status: DC | PRN
  Administered 2012-03-21 (×2): 0.4 mg via INTRAVENOUS

## 2012-03-21 MED ORDER — ONDANSETRON HCL 4 MG/2ML IJ SOLN: 4.0000 mg | INTRAMUSCULAR | Status: DC | PRN

## 2012-03-21 MED ORDER — ONDANSETRON HCL 4 MG/2ML IJ SOLN
INTRAMUSCULAR | Status: DC | PRN
  Administered 2012-03-21: 4 mg via INTRAVENOUS

## 2012-03-21 MED ORDER — ROCURONIUM BROMIDE 100 MG/10ML IV SOLN
INTRAVENOUS | Status: DC | PRN
  Administered 2012-03-21: 50 mg via INTRAVENOUS

## 2012-03-21 MED ORDER — FENTANYL CITRATE 0.05 MG/ML IJ SOLN
50.0000 ug | INTRAMUSCULAR | Status: DC | PRN
  Administered 2012-03-21: 50 ug via INTRAVENOUS

## 2012-03-21 MED ORDER — CEFAZOLIN SODIUM 1-5 GM-% IV SOLN
1.0000 g | Freq: Three times a day (TID) | INTRAVENOUS | Status: DC
  Administered 2012-03-22: 1 g via INTRAVENOUS
  Filled 2012-03-21 (×2): qty 50

## 2012-03-21 MED ORDER — ADULT MULTIVITAMIN W/MINERALS CH
1.0000 | ORAL_TABLET | ORAL | Status: DC
  Administered 2012-03-22: 1 via ORAL
  Filled 2012-03-21: qty 1

## 2012-03-21 MED ORDER — CEFAZOLIN SODIUM-DEXTROSE 2-3 GM-% IV SOLR: 2.0000 g | INTRAVENOUS | Status: DC

## 2012-03-21 MED ORDER — MIDAZOLAM HCL 2 MG/2ML IJ SOLN
INTRAMUSCULAR | Status: AC
  Filled 2012-03-21: qty 2

## 2012-03-21 MED ORDER — THROMBIN 5000 UNITS EX SOLR
CUTANEOUS | Status: DC | PRN
  Administered 2012-03-21 (×2): 5000 [IU] via TOPICAL

## 2012-03-21 MED ORDER — CEFAZOLIN SODIUM 1-5 GM-% IV SOLN
INTRAVENOUS | Status: DC | PRN
  Administered 2012-03-21: 2 g via INTRAVENOUS

## 2012-03-21 MED ORDER — MEPERIDINE HCL 25 MG/ML IJ SOLN: 6.2500 mg | INTRAMUSCULAR | Status: DC | PRN

## 2012-03-21 MED ORDER — CALCIUM CARBONATE 1250 (500 CA) MG PO TABS
1.0000 | ORAL_TABLET | ORAL | Status: DC
  Administered 2012-03-22: 500 mg via ORAL
  Filled 2012-03-21: qty 1

## 2012-03-21 MED ORDER — PROPOFOL 10 MG/ML IV EMUL
INTRAVENOUS | Status: DC | PRN
  Administered 2012-03-21: 150 mg via INTRAVENOUS

## 2012-03-21 MED ORDER — MIDAZOLAM HCL 2 MG/2ML IJ SOLN: 0.5000 mg | Freq: Once | INTRAMUSCULAR | Status: DC | PRN

## 2012-03-21 MED ORDER — LIDOCAINE-EPINEPHRINE 1 %-1:100000 IJ SOLN
INTRAMUSCULAR | Status: DC | PRN
  Administered 2012-03-21: 10 mL

## 2012-03-21 MED ORDER — LIDOCAINE HCL (CARDIAC) 20 MG/ML IV SOLN
INTRAVENOUS | Status: DC | PRN
  Administered 2012-03-21: 80 mg via INTRAVENOUS

## 2012-03-21 MED ORDER — CEFAZOLIN SODIUM-DEXTROSE 2-3 GM-% IV SOLR
INTRAVENOUS | Status: AC
  Filled 2012-03-21: qty 50

## 2012-03-21 MED ORDER — ACETAMINOPHEN 650 MG RE SUPP: 650.0000 mg | RECTAL | Status: DC | PRN

## 2012-03-21 MED ORDER — BACITRACIN 50000 UNITS IM SOLR
INTRAMUSCULAR | Status: AC
  Filled 2012-03-21: qty 1

## 2012-03-21 MED ORDER — BUPIVACAINE HCL 0.25 % IJ SOLN
INTRAMUSCULAR | Status: DC | PRN
  Administered 2012-03-21: 10 mL

## 2012-03-21 MED ORDER — SODIUM CHLORIDE 0.9 % IV SOLN
INTRAVENOUS | Status: AC
  Filled 2012-03-21: qty 500

## 2012-03-21 MED ORDER — 0.9 % SODIUM CHLORIDE (POUR BTL) OPTIME
TOPICAL | Status: DC | PRN
  Administered 2012-03-21: 1000 mL

## 2012-03-21 MED ORDER — POLYVINYL ALCOHOL 1.4 % OP SOLN
2.0000 [drp] | OPHTHALMIC | Status: DC | PRN
  Administered 2012-03-21: 2 [drp] via OPHTHALMIC
  Filled 2012-03-21: qty 15

## 2012-03-21 MED ORDER — KCL IN DEXTROSE-NACL 20-5-0.45 MEQ/L-%-% IV SOLN
INTRAVENOUS | Status: DC
  Administered 2012-03-22: via INTRAVENOUS
  Filled 2012-03-21 (×2): qty 1000

## 2012-03-21 MED ORDER — DIAZEPAM 5 MG PO TABS: 5.0000 mg | ORAL_TABLET | Freq: Four times a day (QID) | ORAL | Status: DC | PRN

## 2012-03-21 MED ORDER — ACETAMINOPHEN 325 MG PO TABS: 650.0000 mg | ORAL_TABLET | ORAL | Status: DC | PRN

## 2012-03-21 SURGICAL SUPPLY — 55 items
BAG DECANTER FOR FLEXI CONT (MISCELLANEOUS) ×2 IMPLANT
BLADE SURG 11 STRL SS (BLADE) ×2 IMPLANT
BLADE SURG 15 STRL LF DISP TIS (BLADE) IMPLANT
BLADE SURG 15 STRL SS (BLADE)
BLADE SURG ROTATE 9660 (MISCELLANEOUS) ×2 IMPLANT
CANISTER SUCTION 2500CC (MISCELLANEOUS) ×2 IMPLANT
CLOTH BEACON ORANGE TIMEOUT ST (SAFETY) ×2 IMPLANT
CONT SPEC 4OZ CLIKSEAL STRL BL (MISCELLANEOUS) ×2 IMPLANT
DECANTER SPIKE VIAL GLASS SM (MISCELLANEOUS) IMPLANT
DERMABOND ADVANCED (GAUZE/BANDAGES/DRESSINGS) ×1
DERMABOND ADVANCED .7 DNX12 (GAUZE/BANDAGES/DRESSINGS) ×1 IMPLANT
DRAPE C-ARM 42X72 X-RAY (DRAPES) ×6 IMPLANT
DRAPE LAPAROTOMY 100X72 PEDS (DRAPES) ×2 IMPLANT
DRAPE MICROSCOPE LEICA (MISCELLANEOUS) ×6 IMPLANT
DRAPE POUCH INSTRU U-SHP 10X18 (DRAPES) ×2 IMPLANT
DURAPREP 6ML APPLICATOR 50/CS (WOUND CARE) ×2 IMPLANT
ELECT BLADE 6.5 EXT (BLADE) ×2 IMPLANT
ELECT REM PT RETURN 9FT ADLT (ELECTROSURGICAL) ×2
ELECTRODE REM PT RTRN 9FT ADLT (ELECTROSURGICAL) ×1 IMPLANT
GAUZE SPONGE 4X4 16PLY XRAY LF (GAUZE/BANDAGES/DRESSINGS) IMPLANT
GLOVE BIO SURGEON STRL SZ 6.5 (GLOVE) ×6 IMPLANT
GLOVE BIO SURGEON STRL SZ8 (GLOVE) ×2 IMPLANT
GLOVE BIOGEL PI IND STRL 7.5 (GLOVE) ×1 IMPLANT
GLOVE BIOGEL PI IND STRL 8 (GLOVE) ×1 IMPLANT
GLOVE BIOGEL PI IND STRL 8.5 (GLOVE) ×1 IMPLANT
GLOVE BIOGEL PI INDICATOR 7.5 (GLOVE) ×1
GLOVE BIOGEL PI INDICATOR 8 (GLOVE) ×1
GLOVE BIOGEL PI INDICATOR 8.5 (GLOVE) ×1
GLOVE ECLIPSE 7.5 STRL STRAW (GLOVE) ×2 IMPLANT
GLOVE ECLIPSE 8.0 STRL XLNG CF (GLOVE) ×2 IMPLANT
GLOVE EXAM NITRILE LRG STRL (GLOVE) IMPLANT
GLOVE EXAM NITRILE MD LF STRL (GLOVE) IMPLANT
GLOVE EXAM NITRILE XL STR (GLOVE) IMPLANT
GLOVE EXAM NITRILE XS STR PU (GLOVE) IMPLANT
GLOVE INDICATOR 6.5 STRL GRN (GLOVE) ×4 IMPLANT
GOWN BRE IMP SLV AUR LG STRL (GOWN DISPOSABLE) ×6 IMPLANT
GOWN BRE IMP SLV AUR XL STRL (GOWN DISPOSABLE) ×2 IMPLANT
GOWN STRL REIN 2XL LVL4 (GOWN DISPOSABLE) ×2 IMPLANT
KIT BASIN OR (CUSTOM PROCEDURE TRAY) ×2 IMPLANT
KIT ROOM TURNOVER OR (KITS) ×2 IMPLANT
NEEDLE HYPO 18GX1.5 BLUNT FILL (NEEDLE) IMPLANT
NEEDLE HYPO 22GX1.5 SAFETY (NEEDLE) ×2 IMPLANT
NEEDLE SPNL 20GX3.5 QUINCKE YW (NEEDLE) IMPLANT
NS IRRIG 1000ML POUR BTL (IV SOLUTION) ×2 IMPLANT
PACK LAMINECTOMY NEURO (CUSTOM PROCEDURE TRAY) ×2 IMPLANT
PAD ARMBOARD 7.5X6 YLW CONV (MISCELLANEOUS) ×6 IMPLANT
RUBBERBAND STERILE (MISCELLANEOUS) ×4 IMPLANT
SPONGE SURGIFOAM ABS GEL SZ50 (HEMOSTASIS) ×2 IMPLANT
SUT VIC AB 3-0 SH 8-18 (SUTURE) ×2 IMPLANT
SYR 20ML ECCENTRIC (SYRINGE) ×2 IMPLANT
SYR 5ML LL (SYRINGE) IMPLANT
TOWEL OR 17X24 6PK STRL BLUE (TOWEL DISPOSABLE) ×2 IMPLANT
TOWEL OR 17X26 10 PK STRL BLUE (TOWEL DISPOSABLE) ×2 IMPLANT
WATER STERILE IRR 1000ML POUR (IV SOLUTION) ×2 IMPLANT
WIRE TIP MIS 2.5MM NEURO (BURR) ×2 IMPLANT

## 2012-03-21 NOTE — Interval H&P Note (Signed)
History and Physical Interval Note:  03/21/2012 7:39 AM  [name]  has presented today for surgery, with the diagnosis of Cervical radiculopathy, Muscle weakness, Cervical stenosis  The various methods of treatment have been discussed with the patient and family. After consideration of risks, benefits and other options for treatment, the patient has consented to  Procedure(s) (LRB): POSTERIOR CERVICAL LAMINECTOMY (Right) as a surgical intervention .  The patient's history has been reviewed, patient examined, no change in status, stable for surgery.  I have reviewed the patient's chart and labs.  Questions were answered to the patient's satisfaction.     [name]  Date of Initial H&P: 03/20/2012  History reviewed, patient examined, no change in status, stable for surgery.

## 2012-03-21 NOTE — Anesthesia Postprocedure Evaluation (Signed)
  Anesthesia Post-op Note  Patient: [name]  Procedure(s) Performed: Procedure(s) (LRB): POSTERIOR CERVICAL LAMINECTOMY WITH MET- RX (Right)  Patient Location: PACU  Anesthesia Type: General  Level of Consciousness: awake, alert  and oriented  Airway and Oxygen Therapy: Patient Spontanous Breathing and Patient connected to nasal cannula oxygen  Post-op Pain: mild  Post-op Assessment: Post-op Vital signs reviewed  Post-op Vital Signs: Reviewed  Complications: possible eye injury

## 2012-03-21 NOTE — Anesthesia Procedure Notes (Signed)
Procedure Name: Intubation Performed by: Shraddha Lebron L Pre-anesthesia Checklist: Patient identified, Emergency Drugs available, Suction available and Patient being monitored Patient Re-evaluated:Patient Re-evaluated prior to inductionOxygen Delivery Method: Circle system utilized Preoxygenation: Pre-oxygenation with 100% oxygen Intubation Type: IV induction and Cricoid Pressure applied Ventilation: Oral airway inserted - appropriate to patient size and Mask ventilation without difficulty Grade View: Grade I Tube type: Oral Number of attempts: 1 Airway Equipment and Method: Video-laryngoscopy and Stylet Placement Confirmation: ETT inserted through vocal cords under direct vision,  positive ETCO2,  CO2 detector and breath sounds checked- equal and bilateral Secured at: 21 cm Tube secured with: Tape Dental Injury: Teeth and Oropharynx as per pre-operative assessment  Difficulty Due To: Difficult Airway- due to reduced neck mobility

## 2012-03-21 NOTE — Op Note (Signed)
03/21/2012  8:03 PM  PATIENT:  [name]  61 y.o. female  PRE-OPERATIVE DIAGNOSIS:  Cervical radiculopathy, Muscle weakness, Cervical stenosis C4/5 Right  POST-OPERATIVE DIAGNOSIS:  Cervical radiculopathy, Muscle weakness, Cervical stenosis C4/5 Right  PROCEDURE:  Procedure(s) (LRB): POSTERIOR Sitting CERVICAL LAMINECTOMY WITH MET- RX (Right) C4/5   SURGEON:  Surgeon(s) and Role:    * Maeola Harman, MD - Primary  PHYSICIAN ASSISTANT:   ASSISTANTS: Poteat, RN   ANESTHESIA:   general  EBL:  Total I/O In: 800 [I.V.:800] Out: -   BLOOD ADMINISTERED:none  DRAINS: none   LOCAL MEDICATIONS USED:  LIDOCAINE   SPECIMEN:  No Specimen  DISPOSITION OF SPECIMEN:  N/A  COUNTS:  YES  TOURNIQUET:  * No tourniquets in log *  DICTATION: DICTATION: Patient was placed on OR table and following smooth and uncomplicated induction of general endotracheal anesthesia and placement of central line and precordial monitor by Anesthesia, the patient was placed in three pin head fixation and placed in a sitting position.  C arm was used to confirm the C4/5 level.  Skin was prepped with Duraprep and draped.  Sequential dilators were used with [employer] tubular retractor (6cm x 18 mm) and docked on C4/5 lamina.  A keyhole laminotomy was performed with high speed drill and Kerrison punches.  Using microdissection, the lateral thecal sac was identified and the C5 nerve root was decompressed with removal of overlying bone and ligamentous tissues.  The nerve root was mobilized thoroughly and decompression was confirmed using micro-hooks.    At this point, it was felt that neural elements were well decompressed.  Hemostasis was assured with gelfoam and thrombin.    The tubular retractor was removed and the wound was closed with interrupted vicryl stitches and dressed with Derma bond. The patient was removed from pins, returned to a supine position and extubated in the operating room having tolerated surgery  without difficulty.  PLAN OF CARE: Admit for overnight observation  PATIENT DISPOSITION:  PACU - hemodynamically stable.   Delay start of Pharmacological VTE agent (>24hrs) due to surgical blood loss or risk of bleeding: yes

## 2012-03-21 NOTE — Anesthesia Preprocedure Evaluation (Addendum)
Anesthesia Evaluation  Patient identified by MRN, date of birth, ID band Patient awake    Reviewed: Allergy & Precautions, H&P , NPO status , Patient's Chart, lab work & pertinent test results  History of Anesthesia Complications Negative for: history of anesthetic complications  Airway Mallampati: III TM Distance: >3 FB Neck ROM: Limited  Mouth opening: Limited Mouth Opening  Dental No notable dental hx. (+) Teeth Intact and Dental Advisory Given   Pulmonary COPDCurrent Smoker,  breath sounds clear to auscultation  Pulmonary exam normal       Cardiovascular hypertension, Pt. on medications Rhythm:Regular Rate:Normal     Neuro/Psych    GI/Hepatic negative GI ROS, Neg liver ROS,   Endo/Other  Morbid obesity  Renal/GU negative Renal ROS     Musculoskeletal   Abdominal (+) + obese,   Peds  Hematology   Anesthesia Other Findings   Reproductive/Obstetrics                           Anesthesia Physical Anesthesia Plan  ASA: III  Anesthesia Plan: General   Post-op Pain Management:    Induction: Intravenous  Airway Management Planned: Oral ETT and Video Laryngoscope Planned  Additional Equipment: Precordial Doppler Monitoring, Ultrasound Guidance Line Placement and CVP  Intra-op Plan:   Post-operative Plan: Extubation in OR  Informed Consent: I have reviewed the patients History and Physical, chart, labs and discussed the procedure including the risks, benefits and alternatives for the proposed anesthesia with the patient or authorized representative who has indicated his/her understanding and acceptance.   Dental advisory given  Plan Discussed with: CRNA, Surgeon and Anesthesiologist  Anesthesia Plan Comments: (Plan routine monitors, Central line and pre-cordial doppler, GETA with VideoGlide intubation  )       Anesthesia Quick Evaluation

## 2012-03-21 NOTE — Progress Notes (Signed)
Awake, alert, conversant.  Motor exam unchanged from preop with minimal right deltoid function and 3/5 right biceps.  Otherwise, full strength in arms and legs.  Discussed situation in detail with patient's sister, Kathie Rhodes.

## 2012-03-21 NOTE — Transfer of Care (Signed)
Immediate Anesthesia Transfer of Care Note  Patient: [name]  Procedure(s) Performed: Procedure(s) (LRB): POSTERIOR CERVICAL LAMINECTOMY WITH MET- RX (Right)  Patient Location: PACU  Anesthesia Type: General  Level of Consciousness: awake, alert  and patient cooperative  Airway & Oxygen Therapy: Patient Spontanous Breathing and Patient connected to nasal cannula oxygen  Post-op Assessment: Report given to PACU RN, Post -op Vital signs reviewed and stable and Patient moving all extremities  Post vital signs: Reviewed and stable  Complications: No apparent anesthesia complications

## 2012-03-21 NOTE — Progress Notes (Signed)
Call to report to dr. Ivin Booty pts r eye red and pt states it feels like there is something in it.  He to come see pt.

## 2012-03-22 MED ORDER — PANTOPRAZOLE SODIUM 40 MG PO TBEC: 40.0000 mg | DELAYED_RELEASE_TABLET | Freq: Every day | ORAL | Status: DC

## 2012-03-22 NOTE — Discharge Summary (Signed)
Physician Discharge Summary  Patient ID: [name] MRN: [id number] DOB/AGE: 63-Jan-1952 63 y.o.  Admit date: 03/21/2012 Discharge date: 03/22/2012  Admission Diagnoses: Cervical radiculopathy, Muscle weakness, Cervical stenosis C4/5 Right    Discharge Diagnoses: Cervical radiculopathy, Muscle weakness, Cervical stenosis C4/5 Right  s/p POSTERIOR Sitting CERVICAL LAMINECTOMY WITH MET- RX (Right) C4/5   Active Problems:  * No active hospital problems. *    Discharged Condition: good  Hospital Course: [name] was admitted for cervical laminectomy on 03-21-12 with Dx of muscle weakness and radiculopathy with stenosis at C4-5 on the right.  Following uncomplicated posterior sitting cervical Met-Rx laminectomy, she recovered well in Neuro PACU before transfer to 3500 for overnight observation.   Consults: None  Significant Diagnostic Studies: radiology: X-Ray: intra-operative  Treatments: surgery: POSTERIOR Sitting CERVICAL LAMINECTOMY WITH MET- RX (Right) C4/5    Discharge Exam: Blood pressure 89/64, pulse 50, temperature 98.4 F (36.9 C), temperature source Oral, resp. rate 14, height 5\' 5"  (1.651 m), weight 95.255 kg (210 lb), SpO2 93.00%. Alert, conversant, smiling and joking. Reports right axilla pain & discomfort now gone. Strength remains weak in right bicep & deltoid as expected immediately post-op.  Incision without erythema, swelling, or drainage. Dermabond intact.    Disposition: 01-Home or Self Care    Rx's: Hydrocodone 10-325 1po q4-6hrs prn pain #60. Robaxin 500mg  1po q8hrs prn spasm #50.  Pt verbalizes understanding of d/c instructions & will call office to schedule 3week post-op visit with Dr. Venetia Maxon.    Medication List  As of 03/22/2012  8:05 AM   ASK your doctor about these medications         [facility] OYSTER SHELL CALCIUM 500 MG Tabs   Generic drug: Oyster Shell Calcium   Take 1 tablet by mouth 3 (three) times a week.      GLUCOSAMINE PO   Take 1 tablet by mouth  3 (three) times a week.      HYDROcodone-acetaminophen 10-325 MG per tablet   Commonly known as: NORCO   Take 1-2 tablets by mouth every 6 (six) hours as needed. For pain      multivitamin with minerals tablet   Take 1 tablet by mouth 3 (three) times a week.      simvastatin 40 MG tablet   Commonly known as: ZOCOR   Take 40 mg by mouth every evening.      SLOW FE PO   Take 1 tablet by mouth 3 (three) times a week.      valsartan-hydrochlorothiazide 80-12.5 MG per tablet   Commonly known as: DIOVAN-HCT   Take 1 tablet by mouth daily.      VITAMIN C PO   Take 1 tablet by mouth 3 (three) times a week.      Vitamin D (Ergocalciferol) 50000 UNITS Caps   Commonly known as: DRISDOL   Take 50,000 Units by mouth every 7 (seven) days. On Weds             Signed: Georgiann Cocker 03/22/2012, 8:05 AM

## 2012-03-23 MED FILL — Mupirocin Oint 2%: CUTANEOUS | Qty: 22 | Status: AC

## 2012-03-23 NOTE — Discharge Summary (Signed)
As above.

## 2012-04-06 ENCOUNTER — Ambulatory Visit: Payer: 59 | Admitting: Physical Therapy

## 2012-04-11 ENCOUNTER — Ambulatory Visit: Payer: 59 | Admitting: Physical Therapy

## 2012-04-13 ENCOUNTER — Ambulatory Visit: Payer: 59 | Admitting: Physical Therapy

## 2012-04-18 ENCOUNTER — Ambulatory Visit: Payer: 59 | Attending: Neurosurgery | Admitting: Physical Therapy

## 2012-04-18 DIAGNOSIS — M542 Cervicalgia: Secondary | ICD-10-CM | POA: Insufficient documentation

## 2012-04-18 DIAGNOSIS — IMO0001 Reserved for inherently not codable concepts without codable children: Secondary | ICD-10-CM | POA: Insufficient documentation

## 2012-04-18 DIAGNOSIS — M6281 Muscle weakness (generalized): Secondary | ICD-10-CM | POA: Insufficient documentation

## 2012-04-18 DIAGNOSIS — M2569 Stiffness of other specified joint, not elsewhere classified: Secondary | ICD-10-CM | POA: Insufficient documentation

## 2012-04-21 ENCOUNTER — Ambulatory Visit: Payer: 59 | Admitting: Physical Therapy

## 2012-04-25 ENCOUNTER — Ambulatory Visit: Payer: 59 | Admitting: Physical Therapy

## 2012-04-27 ENCOUNTER — Ambulatory Visit: Payer: 59 | Admitting: Physical Therapy

## 2012-05-02 ENCOUNTER — Ambulatory Visit: Payer: 59 | Admitting: Physical Therapy

## 2012-05-04 ENCOUNTER — Ambulatory Visit: Payer: 59 | Admitting: Physical Therapy

## 2012-05-09 ENCOUNTER — Ambulatory Visit: Payer: 59 | Admitting: Physical Therapy

## 2012-05-11 ENCOUNTER — Ambulatory Visit: Payer: 59 | Admitting: Physical Therapy

## 2012-05-16 ENCOUNTER — Ambulatory Visit: Payer: 59 | Attending: Neurosurgery | Admitting: Physical Therapy

## 2012-05-16 DIAGNOSIS — M542 Cervicalgia: Secondary | ICD-10-CM | POA: Insufficient documentation

## 2012-05-16 DIAGNOSIS — M6281 Muscle weakness (generalized): Secondary | ICD-10-CM | POA: Insufficient documentation

## 2012-05-16 DIAGNOSIS — IMO0001 Reserved for inherently not codable concepts without codable children: Secondary | ICD-10-CM | POA: Insufficient documentation

## 2012-05-16 DIAGNOSIS — M2569 Stiffness of other specified joint, not elsewhere classified: Secondary | ICD-10-CM | POA: Insufficient documentation

## 2012-05-18 ENCOUNTER — Ambulatory Visit: Payer: 59 | Admitting: Physical Therapy

## 2012-05-23 ENCOUNTER — Ambulatory Visit: Payer: 59 | Admitting: Physical Therapy

## 2012-05-25 ENCOUNTER — Ambulatory Visit: Payer: 59 | Admitting: Physical Therapy

## 2012-05-30 ENCOUNTER — Ambulatory Visit: Payer: 59 | Admitting: Physical Therapy

## 2012-06-02 ENCOUNTER — Ambulatory Visit: Payer: 59 | Admitting: Physical Therapy

## 2012-06-06 ENCOUNTER — Ambulatory Visit: Payer: 59 | Admitting: Physical Therapy

## 2012-06-08 ENCOUNTER — Ambulatory Visit: Payer: 59 | Admitting: Physical Therapy

## 2012-06-13 ENCOUNTER — Ambulatory Visit: Payer: 59 | Admitting: Physical Therapy

## 2012-06-15 ENCOUNTER — Ambulatory Visit: Payer: 59 | Admitting: Physical Therapy

## 2012-06-20 ENCOUNTER — Ambulatory Visit: Payer: 59 | Attending: Neurosurgery | Admitting: Physical Therapy

## 2012-06-20 DIAGNOSIS — IMO0001 Reserved for inherently not codable concepts without codable children: Secondary | ICD-10-CM | POA: Insufficient documentation

## 2012-06-20 DIAGNOSIS — M6281 Muscle weakness (generalized): Secondary | ICD-10-CM | POA: Insufficient documentation

## 2012-06-20 DIAGNOSIS — M542 Cervicalgia: Secondary | ICD-10-CM | POA: Insufficient documentation

## 2012-06-20 DIAGNOSIS — M2569 Stiffness of other specified joint, not elsewhere classified: Secondary | ICD-10-CM | POA: Insufficient documentation

## 2012-06-22 ENCOUNTER — Ambulatory Visit: Payer: 59 | Admitting: Physical Therapy

## 2012-06-27 ENCOUNTER — Ambulatory Visit: Payer: 59 | Admitting: Physical Therapy

## 2012-06-29 ENCOUNTER — Ambulatory Visit: Payer: 59 | Admitting: Physical Therapy

## 2012-07-04 ENCOUNTER — Ambulatory Visit: Payer: 59 | Admitting: Physical Therapy

## 2012-07-06 ENCOUNTER — Ambulatory Visit: Payer: 59 | Admitting: Physical Therapy

## 2012-07-11 ENCOUNTER — Ambulatory Visit: Payer: 59 | Admitting: Physical Therapy

## 2012-07-18 ENCOUNTER — Ambulatory Visit: Payer: 59 | Attending: Neurosurgery | Admitting: Physical Therapy

## 2012-07-18 DIAGNOSIS — IMO0001 Reserved for inherently not codable concepts without codable children: Secondary | ICD-10-CM | POA: Insufficient documentation

## 2012-07-18 DIAGNOSIS — M542 Cervicalgia: Secondary | ICD-10-CM | POA: Insufficient documentation

## 2012-07-18 DIAGNOSIS — M6281 Muscle weakness (generalized): Secondary | ICD-10-CM | POA: Insufficient documentation

## 2012-07-18 DIAGNOSIS — M2569 Stiffness of other specified joint, not elsewhere classified: Secondary | ICD-10-CM | POA: Insufficient documentation

## 2012-07-21 ENCOUNTER — Ambulatory Visit: Payer: 59 | Admitting: Physical Therapy

## 2012-07-25 ENCOUNTER — Ambulatory Visit: Payer: 59 | Admitting: Physical Therapy

## 2012-07-27 ENCOUNTER — Ambulatory Visit: Payer: 59 | Admitting: Physical Therapy

## 2012-08-01 ENCOUNTER — Ambulatory Visit: Payer: 59 | Admitting: Physical Therapy

## 2012-08-03 ENCOUNTER — Ambulatory Visit: Payer: 59 | Admitting: Physical Therapy

## 2012-08-11 ENCOUNTER — Ambulatory Visit: Payer: 59 | Admitting: Physical Therapy

## 2012-08-15 ENCOUNTER — Ambulatory Visit: Payer: 59 | Admitting: Physical Therapy

## 2012-09-11 ENCOUNTER — Ambulatory Visit: Payer: 59 | Attending: Neurosurgery | Admitting: Physical Therapy

## 2012-09-11 DIAGNOSIS — M6281 Muscle weakness (generalized): Secondary | ICD-10-CM | POA: Insufficient documentation

## 2012-09-11 DIAGNOSIS — M542 Cervicalgia: Secondary | ICD-10-CM | POA: Insufficient documentation

## 2012-09-11 DIAGNOSIS — M2569 Stiffness of other specified joint, not elsewhere classified: Secondary | ICD-10-CM | POA: Insufficient documentation

## 2012-09-11 DIAGNOSIS — IMO0001 Reserved for inherently not codable concepts without codable children: Secondary | ICD-10-CM | POA: Insufficient documentation

## 2012-10-08 ENCOUNTER — Encounter (HOSPITAL_COMMUNITY): Payer: Self-pay | Admitting: Emergency Medicine

## 2012-10-08 ENCOUNTER — Emergency Department (HOSPITAL_COMMUNITY)
Admission: EM | Admit: 2012-10-08 | Discharge: 2012-10-08 | Disposition: A | Discharge status: Home / Self Care | Payer: 59 | Attending: Emergency Medicine | Admitting: Emergency Medicine

## 2012-10-08 ENCOUNTER — Emergency Department (HOSPITAL_COMMUNITY): Payer: 59

## 2012-10-08 DIAGNOSIS — K921 Melena: Secondary | ICD-10-CM | POA: Insufficient documentation

## 2012-10-08 DIAGNOSIS — E7800 Pure hypercholesterolemia, unspecified: Secondary | ICD-10-CM | POA: Insufficient documentation

## 2012-10-08 DIAGNOSIS — Z8739 Personal history of other diseases of the musculoskeletal system and connective tissue: Secondary | ICD-10-CM | POA: Insufficient documentation

## 2012-10-08 DIAGNOSIS — Z8719 Personal history of other diseases of the digestive system: Secondary | ICD-10-CM | POA: Insufficient documentation

## 2012-10-08 DIAGNOSIS — R109 Unspecified abdominal pain: Secondary | ICD-10-CM | POA: Insufficient documentation

## 2012-10-08 DIAGNOSIS — Z79899 Other long term (current) drug therapy: Secondary | ICD-10-CM | POA: Insufficient documentation

## 2012-10-08 DIAGNOSIS — R197 Diarrhea, unspecified: Secondary | ICD-10-CM | POA: Insufficient documentation

## 2012-10-08 DIAGNOSIS — R509 Fever, unspecified: Secondary | ICD-10-CM | POA: Insufficient documentation

## 2012-10-08 DIAGNOSIS — I10 Essential (primary) hypertension: Secondary | ICD-10-CM | POA: Insufficient documentation

## 2012-10-08 DIAGNOSIS — Z791 Long term (current) use of non-steroidal anti-inflammatories (NSAID): Secondary | ICD-10-CM | POA: Insufficient documentation

## 2012-10-08 DIAGNOSIS — M129 Arthropathy, unspecified: Secondary | ICD-10-CM | POA: Insufficient documentation

## 2012-10-08 DIAGNOSIS — Z90710 Acquired absence of both cervix and uterus: Secondary | ICD-10-CM | POA: Insufficient documentation

## 2012-10-08 DIAGNOSIS — R110 Nausea: Secondary | ICD-10-CM | POA: Insufficient documentation

## 2012-10-08 LAB — URINALYSIS, ROUTINE W REFLEX MICROSCOPIC
Bilirubin Urine: NEGATIVE
Ketones, ur: NEGATIVE mg/dL
Specific Gravity, Urine: 1.022 (ref 1.005–1.030)
Urobilinogen, UA: 0.2 mg/dL (ref 0.0–1.0)
pH: 6 (ref 5.0–8.0)

## 2012-10-08 LAB — CBC WITH DIFFERENTIAL/PLATELET
Eosinophils Absolute: 0.2 10*3/uL (ref 0.0–0.7)
Eosinophils Relative: 2 % (ref 0–5)
HCT: 42.5 % (ref 36.0–46.0)
Hemoglobin: 14.8 g/dL (ref 12.0–15.0)
Lymphs Abs: 4.1 10*3/uL — ABNORMAL HIGH (ref 0.7–4.0)
MCH: 32.2 pg (ref 26.0–34.0)
MCHC: 34.8 g/dL (ref 30.0–36.0)
MCV: 92.4 fL (ref 78.0–100.0)
Monocytes Absolute: 0.8 10*3/uL (ref 0.1–1.0)
Monocytes Relative: 7 % (ref 3–12)
Neutrophils Relative %: 57 % (ref 43–77)
RBC: 4.6 MIL/uL (ref 3.87–5.11)

## 2012-10-08 LAB — BASIC METABOLIC PANEL
BUN: 14 mg/dL (ref 6–23)
Creatinine, Ser: 0.83 mg/dL (ref 0.50–1.10)
GFR calc non Af Amer: 73 mL/min — ABNORMAL LOW (ref >90)
Glucose, Bld: 101 mg/dL — ABNORMAL HIGH (ref 70–99)
Potassium: 3.5 mEq/L (ref 3.5–5.1)

## 2012-10-08 LAB — URINE MICROSCOPIC-ADD ON

## 2012-10-08 LAB — GLUCOSE, CAPILLARY

## 2012-10-08 MED ORDER — SODIUM CHLORIDE 0.9 % IV SOLN
INTRAVENOUS | Status: DC
  Administered 2012-10-08: 19:00:00 via INTRAVENOUS

## 2012-10-08 MED ORDER — IOHEXOL 300 MG/ML  SOLN
100.0000 mL | Freq: Once | INTRAMUSCULAR | Status: AC | PRN
  Administered 2012-10-08: 100 mL via INTRAVENOUS

## 2012-10-08 MED ORDER — ONDANSETRON HCL 4 MG/2ML IJ SOLN
4.0000 mg | Freq: Once | INTRAMUSCULAR | Status: AC
  Administered 2012-10-08: 4 mg via INTRAVENOUS
  Filled 2012-10-08: qty 2

## 2012-10-08 MED ORDER — SODIUM CHLORIDE 0.9 % IV BOLUS (SEPSIS)
1000.0000 mL | Freq: Once | INTRAVENOUS | Status: AC
  Administered 2012-10-08: 1000 mL via INTRAVENOUS

## 2012-10-08 MED ORDER — MORPHINE SULFATE 4 MG/ML IJ SOLN
4.0000 mg | Freq: Once | INTRAMUSCULAR | Status: AC
  Administered 2012-10-08: 4 mg via INTRAVENOUS
  Filled 2012-10-08: qty 1

## 2012-10-08 MED ORDER — ONDANSETRON HCL 4 MG PO TABS: 8.0000 mg | ORAL_TABLET | Freq: Four times a day (QID) | ORAL | Status: DC

## 2012-10-08 MED ORDER — IOHEXOL 300 MG/ML  SOLN
50.0000 mL | Freq: Once | INTRAMUSCULAR | Status: AC | PRN
  Administered 2012-10-08: 50 mL via ORAL

## 2012-10-08 NOTE — ED Notes (Signed)
Pt c/o weakness and nausea x 3 days.  Pt had diverticulitis in December.  States that she is having abd tenderness but no pain.  Denies vomiting.  Some diarrhea.

## 2012-10-08 NOTE — ED Notes (Signed)
Patient transported to CT 

## 2012-10-08 NOTE — ED Provider Notes (Deleted)
History     CSN: [id number]  Arrival date & time 10/08/12  1628   First MD Initiated Contact with Patient 10/08/12 1658      Chief Complaint  Patient presents with  . Weakness  . Nausea    (Consider location/radiation/quality/duration/timing/severity/associated sxs/prior treatment) Patient is a 62 y.o. female presenting with weakness.  Weakness Associated symptoms include abdominal pain.   Complains of left lower quadrant pain accompanied by nausea and generalized weakness for the past 3 days. She ate a ham sandwich today last bowel movement was today which was diarrhea with a slight amount of blood. Had one episode of diarrhea yesterday. No treatment prior to coming here. Maximum temperature 100. Admits to generalized weakness No other associated symptoms. No other complaint. Pain feels like diverticulitis she's had in the past. Currently as 7 on a scale of 1-10. Last treated for diverticulitis December 2013 Past Medical History  Diagnosis Date  . Diverticulitis   . Cervical spinal stenosis   . Hypertension   . Hypercholesteremia   . Arthritis   . Complication of anesthesia     "makes me cry for days."  . Family history of anesthesia complication     Sister has nausea and vomitting.   Hemorrhoid Past Surgical History  Procedure Laterality Date  . Thoracic fusion  1992  . Beningn tumor removal      small intestine  . Anterior cervical decomp/discectomy fusion  12/30/11  . Abdominal hysterectomy  1995  . Colectomy  2004    "lost 1/3 of my colon"  . Back surgery  1992     Family History  Problem Relation Age of Onset  . Anesthesia problems Sister     History  Substance Use Topics  . Smoking status: Current Every Day Smoker -- 0.50 packs/day for 40 years    Types: Cigarettes  . Smokeless tobacco: Never Used  . Alcohol Use: No    OB History   Grav Para Term Preterm Abortions TAB SAB Ect Mult Living                  Review of Systems  HENT: Negative.    Respiratory: Negative.   Cardiovascular: Negative.   Gastrointestinal: Positive for nausea, abdominal pain, diarrhea and blood in stool.  Musculoskeletal: Negative.   Skin: Negative.   Neurological: Positive for weakness.  Psychiatric/Behavioral: Negative.     Allergies  Review of patient's allergies indicates no known allergies.  Home Medications   Current Outpatient Rx  Name  Route  Sig  Dispense  Refill  . Ascorbic Acid (VITAMIN C PO)   Oral   Take 1 tablet by mouth 3 (three) times a week.          . ferrous sulfate 325 (65 FE) MG tablet   Oral   Take 325 mg by mouth 3 (three) times a week.         Marland Kitchen GLUCOSAMINE PO   Oral   Take 1 tablet by mouth 3 (three) times a week.         Marland Kitchen HYDROcodone-acetaminophen (NORCO) 10-325 MG per tablet   Oral   Take 1-2 tablets by mouth every 6 (six) hours as needed. For pain         . Multiple Vitamins-Minerals (MULTIVITAMIN WITH MINERALS) tablet   Oral   Take 1 tablet by mouth 3 (three) times a week.          Ethelda Chick Calcium ([employer] OYSTER SHELL CALCIUM) 500 MG  TABS   Oral   Take 1 tablet by mouth 3 (three) times a week.         . simvastatin (ZOCOR) 40 MG tablet   Oral   Take 40 mg by mouth every evening.         . valsartan-hydrochlorothiazide (DIOVAN-HCT) 80-12.5 MG per tablet   Oral   Take 1 tablet by mouth every morning.            BP 117/73  Pulse 86  Temp(Src) 98.6 F (37 C) (Oral)  Resp 24  SpO2 96%  Physical Exam  Nursing note and vitals reviewed. Constitutional: She appears well-developed and well-nourished. No distress.  HENT:  Head: Normocephalic and atraumatic.  Mucous membranes dry  Eyes: Conjunctivae are normal. Pupils are equal, round, and reactive to light.  Neck: Neck supple. No tracheal deviation present. No thyromegaly present.  Cardiovascular: Normal rate and regular rhythm.   No murmur heard. Pulmonary/Chest: Effort normal and breath sounds normal.  Abdominal: Soft.  Bowel sounds are normal. She exhibits no distension and no mass. There is tenderness. There is no rebound and no guarding.  Midline surgical scar. Tender over right lower quadrant and left lower quadrant  Musculoskeletal: Normal range of motion. She exhibits no edema and no tenderness.  Neurological: She is alert. Coordination normal.  Gait normal  Skin: Skin is warm and dry. No rash noted.  Psychiatric: She has a normal mood and affect.    ED Course  Procedures (including critical care time)  Labs Reviewed  CBC WITH DIFFERENTIAL  BASIC METABOLIC PANEL  URINALYSIS, ROUTINE W REFLEX MICROSCOPIC   No results found.   No diagnosis found. 8:20 PM he is resting comfortably after treatment with morphine and Zofran. She feels ready to go home.  Results for orders placed during the hospital encounter of 10/08/12  CBC WITH DIFFERENTIAL      Result Value Range   WBC 12.1 (*) 4.0 - 10.5 K/uL   RBC 4.60  3.87 - 5.11 MIL/uL   Hemoglobin 14.8  12.0 - 15.0 g/dL   HCT 16.1  09.6 - 04.5 %   MCV 92.4  78.0 - 100.0 fL   MCH 32.2  26.0 - 34.0 pg   MCHC 34.8  30.0 - 36.0 g/dL   RDW 40.9  81.1 - 91.4 %   Platelets 299  150 - 400 K/uL   Neutrophils Relative 57  43 - 77 %   Neutro Abs 7.0  1.7 - 7.7 K/uL   Lymphocytes Relative 34  12 - 46 %   Lymphs Abs 4.1 (*) 0.7 - 4.0 K/uL   Monocytes Relative 7  3 - 12 %   Monocytes Absolute 0.8  0.1 - 1.0 K/uL   Eosinophils Relative 2  0 - 5 %   Eosinophils Absolute 0.2  0.0 - 0.7 K/uL   Basophils Relative 1  0 - 1 %   Basophils Absolute 0.1  0.0 - 0.1 K/uL  BASIC METABOLIC PANEL      Result Value Range   Sodium 137  135 - 145 mEq/L   Potassium 3.5  3.5 - 5.1 mEq/L   Chloride 98  96 - 112 mEq/L   CO2 29  19 - 32 mEq/L   Glucose, Bld 101 (*) 70 - 99 mg/dL   BUN 14  6 - 23 mg/dL   Creatinine, Ser 7.82  0.50 - 1.10 mg/dL   Calcium 9.7  8.4 - 95.6 mg/dL   GFR calc non  Af Amer 73 (*) >90 mL/min   GFR calc Af Amer 85 (*) >90 mL/min  URINALYSIS,  ROUTINE W REFLEX MICROSCOPIC      Result Value Range   Color, Urine YELLOW  YELLOW   APPearance CLEAR  CLEAR   Specific Gravity, Urine 1.022  1.005 - 1.030   pH 6.0  5.0 - 8.0   Glucose, UA NEGATIVE  NEGATIVE mg/dL   Hgb urine dipstick TRACE (*) NEGATIVE   Bilirubin Urine NEGATIVE  NEGATIVE   Ketones, ur NEGATIVE  NEGATIVE mg/dL   Protein, ur NEGATIVE  NEGATIVE mg/dL   Urobilinogen, UA 0.2  0.0 - 1.0 mg/dL   Nitrite NEGATIVE  NEGATIVE   Leukocytes, UA NEGATIVE  NEGATIVE  GLUCOSE, CAPILLARY      Result Value Range   Glucose-Capillary 103 (*) 70 - 99 mg/dL   Comment 1 Notify RN    URINE MICROSCOPIC-ADD ON      Result Value Range   Squamous Epithelial / LPF RARE  RARE   WBC, UA 0-2  <3 WBC/hpf   Bacteria, UA RARE  RARE   Ct Abdomen Pelvis W Contrast  10/08/2012  *RADIOLOGY REPORT*  Clinical Data: 62 year old female with left-sided abdominal pelvic pain and chills.  History of partial colectomy.  CT ABDOMEN AND PELVIS WITH CONTRAST  Technique:  Multidetector CT imaging of the abdomen and pelvis was performed following the standard protocol during bolus administration of intravenous contrast.  Contrast: 100 ml intravenous Omnipaque-300  Comparison: 09/23/2011 CT  Findings: Probable mild fatty infiltration of the liver noted.  The spleen, gallbladder, right adrenal gland and kidneys are unremarkable except for mild bilateral renal cortical thinning. Nodularity of the left adrenal gland is unchanged. Mild pancreatic atrophy is again noted.  A small to moderate wide mouthed umbilical hernia containing small bowel loops are noted.  There is no evidence of small bowel obstruction or pneumoperitoneum. No other bowel abnormalities are identified except for scattered colonic diverticula.  No free fluid, enlarged lymph nodes, biliary dilation or abdominal aortic aneurysm identified.  The patient is status post hysterectomy.  No acute or suspicious bony abnormalities are noted.  Degenerative changes in the  lower lumbar spine identified.  IMPRESSION: No evidence of acute abnormality.  Relatively unchanged small to moderate wide mouthed umbilical hernia containing small bowel - no evidence of bowel obstruction.  Probable mild fatty infiltration of the liver.   Original Report Authenticated By: Harmon Pier, M.D.     MDM  Plan prescription Zofran. Patient has hydrocodone at home. Followup with Dr. Massie Maroon or Dr. Loreta Ave if significant pain in 2 or 3 days or return if condition worsens. Diagnosis nonspecific abdominal pain        Doug Sou, MD 10/08/12 2028

## 2012-10-31 NOTE — ED Provider Notes (Addendum)
History     CSN: [id number]  Arrival date & time 10/08/12  1628   First MD Initiated Contact with Patient 10/08/12 1658      Chief Complaint  Patient presents with  . Weakness  . Nausea    (Consider location/radiation/quality/duration/timing/severity/associated sxs/prior treatment) Weakness Associated symptoms include abdominal pain.  Patient is a 62 y.o. female presenting with weakness.   Complains of left lower quadrant pain accompanied by nausea and generalized weakness for the past 3 days. She ate a ham sandwich today last bowel movement was today which was diarrhea with a slight amount of blood. Had one episode of diarrhea yesterday. No treatment prior to coming here. Maximum temperature 100. Admits to generalized weakness No other associated symptoms. No other complaint. Pain feels like diverticulitis she's had in the past. Currently as 7 on a scale of 1-10. Last treated for diverticulitis December 2013 Past Medical History  Diagnosis Date  . Diverticulitis   . Cervical spinal stenosis   . Hypertension   . Hypercholesteremia   . Arthritis   . Complication of anesthesia     "makes me cry for days."  . Family history of anesthesia complication     Sister has nausea and vomitting.   Hemorrhoid Past Surgical History  Procedure Laterality Date  . Thoracic fusion  1992  . Beningn tumor removal      small intestine  . Anterior cervical decomp/discectomy fusion  12/30/11  . Abdominal hysterectomy  1995  . Colectomy  2004    "lost 1/3 of my colon"  . Back surgery  1992     Family History  Problem Relation Age of Onset  . Anesthesia problems Sister     History  Substance Use Topics  . Smoking status: Current Every Day Smoker -- 0.50 packs/day for 40 years    Types: Cigarettes  . Smokeless tobacco: Never Used  . Alcohol Use: No    OB History   Grav Para Term Preterm Abortions TAB SAB Ect Mult Living                  Review of Systems  HENT: Negative.    Respiratory: Negative.   Cardiovascular: Negative.   Gastrointestinal: Positive for nausea, abdominal pain, diarrhea and blood in stool.  Musculoskeletal: Negative.   Skin: Negative.   Neurological: Positive for weakness.  Psychiatric/Behavioral: Negative.     Allergies  Review of patient's allergies indicates no known allergies.  Home Medications   Current Outpatient Rx  Name  Route  Sig  Dispense  Refill  . Ascorbic Acid (VITAMIN C PO)   Oral   Take 1 tablet by mouth 3 (three) times a week.          . ferrous sulfate 325 (65 FE) MG tablet   Oral   Take 325 mg by mouth 3 (three) times a week.         Marland Kitchen GLUCOSAMINE PO   Oral   Take 1 tablet by mouth 3 (three) times a week.         Marland Kitchen HYDROcodone-acetaminophen (NORCO) 10-325 MG per tablet   Oral   Take 1-2 tablets by mouth every 6 (six) hours as needed. For pain         . Multiple Vitamins-Minerals (MULTIVITAMIN WITH MINERALS) tablet   Oral   Take 1 tablet by mouth 3 (three) times a week.          Ethelda Chick Calcium ([employer] OYSTER SHELL CALCIUM) 500 MG  TABS   Oral   Take 1 tablet by mouth 3 (three) times a week.         . simvastatin (ZOCOR) 40 MG tablet   Oral   Take 40 mg by mouth every evening.         . valsartan-hydrochlorothiazide (DIOVAN-HCT) 80-12.5 MG per tablet   Oral   Take 1 tablet by mouth every morning.          . ondansetron (ZOFRAN) 4 MG tablet   Oral   Take 2 tablets (8 mg total) by mouth every 6 (six) hours.   12 tablet   0     BP 128/74  Pulse 71  Temp(Src) 98.6 F (37 C) (Oral)  Resp 18  SpO2 98%  Physical Exam  Nursing note and vitals reviewed. Constitutional: She appears well-developed and well-nourished. No distress.  HENT:  Head: Normocephalic and atraumatic.  Mucous membranes dry  Eyes: Conjunctivae are normal. Pupils are equal, round, and reactive to light.  Neck: Neck supple. No tracheal deviation present. No thyromegaly present.  Cardiovascular: Normal  rate and regular rhythm.   No murmur heard. Pulmonary/Chest: Effort normal and breath sounds normal.  Abdominal: Soft. Bowel sounds are normal. She exhibits no distension and no mass. There is tenderness. There is no rebound and no guarding.  Midline surgical scar. Tender over right lower quadrant and left lower quadrant  Musculoskeletal: Normal range of motion. She exhibits no edema and no tenderness.  Neurological: She is alert. Coordination normal.  Gait normal  Skin: Skin is warm and dry. No rash noted.  Psychiatric: She has a normal mood and affect.    ED Course  Procedures (including critical care time)  Labs Reviewed  CBC WITH DIFFERENTIAL - Abnormal; Notable for the following:    WBC 12.1 (*)    Lymphs Abs 4.1 (*)    All other components within normal limits  BASIC METABOLIC PANEL - Abnormal; Notable for the following:    Glucose, Bld 101 (*)    GFR calc non Af Amer 73 (*)    GFR calc Af Amer 85 (*)    All other components within normal limits  URINALYSIS, ROUTINE W REFLEX MICROSCOPIC - Abnormal; Notable for the following:    Hgb urine dipstick TRACE (*)    All other components within normal limits  GLUCOSE, CAPILLARY - Abnormal; Notable for the following:    Glucose-Capillary 103 (*)    All other components within normal limits  URINE MICROSCOPIC-ADD ON   No results found.   1. Abdominal pain    8:20 PM he is resting comfortably after treatment with morphine and Zofran. She feels ready to go home.  Results for orders placed during the hospital encounter of 10/08/12  CBC WITH DIFFERENTIAL      Result Value Range   WBC 12.1 (*) 4.0 - 10.5 K/uL   RBC 4.60  3.87 - 5.11 MIL/uL   Hemoglobin 14.8  12.0 - 15.0 g/dL   HCT 16.1  09.6 - 04.5 %   MCV 92.4  78.0 - 100.0 fL   MCH 32.2  26.0 - 34.0 pg   MCHC 34.8  30.0 - 36.0 g/dL   RDW 40.9  81.1 - 91.4 %   Platelets 299  150 - 400 K/uL   Neutrophils Relative 57  43 - 77 %   Neutro Abs 7.0  1.7 - 7.7 K/uL    Lymphocytes Relative 34  12 - 46 %   Lymphs Abs 4.1 (*)  0.7 - 4.0 K/uL   Monocytes Relative 7  3 - 12 %   Monocytes Absolute 0.8  0.1 - 1.0 K/uL   Eosinophils Relative 2  0 - 5 %   Eosinophils Absolute 0.2  0.0 - 0.7 K/uL   Basophils Relative 1  0 - 1 %   Basophils Absolute 0.1  0.0 - 0.1 K/uL  BASIC METABOLIC PANEL      Result Value Range   Sodium 137  135 - 145 mEq/L   Potassium 3.5  3.5 - 5.1 mEq/L   Chloride 98  96 - 112 mEq/L   CO2 29  19 - 32 mEq/L   Glucose, Bld 101 (*) 70 - 99 mg/dL   BUN 14  6 - 23 mg/dL   Creatinine, Ser 1.47  0.50 - 1.10 mg/dL   Calcium 9.7  8.4 - 82.9 mg/dL   GFR calc non Af Amer 73 (*) >90 mL/min   GFR calc Af Amer 85 (*) >90 mL/min  URINALYSIS, ROUTINE W REFLEX MICROSCOPIC      Result Value Range   Color, Urine YELLOW  YELLOW   APPearance CLEAR  CLEAR   Specific Gravity, Urine 1.022  1.005 - 1.030   pH 6.0  5.0 - 8.0   Glucose, UA NEGATIVE  NEGATIVE mg/dL   Hgb urine dipstick TRACE (*) NEGATIVE   Bilirubin Urine NEGATIVE  NEGATIVE   Ketones, ur NEGATIVE  NEGATIVE mg/dL   Protein, ur NEGATIVE  NEGATIVE mg/dL   Urobilinogen, UA 0.2  0.0 - 1.0 mg/dL   Nitrite NEGATIVE  NEGATIVE   Leukocytes, UA NEGATIVE  NEGATIVE  GLUCOSE, CAPILLARY      Result Value Range   Glucose-Capillary 103 (*) 70 - 99 mg/dL   Comment 1 Notify RN    URINE MICROSCOPIC-ADD ON      Result Value Range   Squamous Epithelial / LPF RARE  RARE   WBC, UA 0-2  <3 WBC/hpf   Bacteria, UA RARE  RARE   Ct Abdomen Pelvis W Contrast  10/08/2012  *RADIOLOGY REPORT*  Clinical Data: 61 year old female with left-sided abdominal pelvic pain and chills.  History of partial colectomy.  CT ABDOMEN AND PELVIS WITH CONTRAST  Technique:  Multidetector CT imaging of the abdomen and pelvis was performed following the standard protocol during bolus administration of intravenous contrast.  Contrast: 100 ml intravenous Omnipaque-300  Comparison: 09/23/2011 CT  Findings: Probable mild fatty infiltration  of the liver noted.  The spleen, gallbladder, right adrenal gland and kidneys are unremarkable except for mild bilateral renal cortical thinning. Nodularity of the left adrenal gland is unchanged. Mild pancreatic atrophy is again noted.  A small to moderate wide mouthed umbilical hernia containing small bowel loops are noted.  There is no evidence of small bowel obstruction or pneumoperitoneum. No other bowel abnormalities are identified except for scattered colonic diverticula.  No free fluid, enlarged lymph nodes, biliary dilation or abdominal aortic aneurysm identified.  The patient is status post hysterectomy.  No acute or suspicious bony abnormalities are noted.  Degenerative changes in the lower lumbar spine identified.  IMPRESSION: No evidence of acute abnormality.  Relatively unchanged small to moderate wide mouthed umbilical hernia containing small bowel - no evidence of bowel obstruction.  Probable mild fatty infiltration of the liver.   Original Report Authenticated By: Harmon Pier, M.D.     MDM  Plan prescription Zofran. Patient has hydrocodone at home. Followup with Dr. Massie Maroon or Dr. Loreta Ave if significant pain in 2  or 3 days or return if condition worsens. Diagnosis nonspecific abdominal pain        Doug Sou, MD 10/08/12 2028 Undeleted the original note Doug Sou, MD 10/31/12 1049  Doug Sou, MD 10/31/12 1050

## 2013-07-19 ENCOUNTER — Encounter (HOSPITAL_COMMUNITY): Payer: Self-pay | Admitting: Emergency Medicine

## 2013-07-19 ENCOUNTER — Emergency Department (HOSPITAL_COMMUNITY)
Admission: EM | Admit: 2013-07-19 | Discharge: 2013-07-19 | Disposition: A | Discharge status: Home / Self Care | Payer: BC Managed Care – PPO | Attending: Emergency Medicine | Admitting: Emergency Medicine

## 2013-07-19 ENCOUNTER — Observation Stay (HOSPITAL_COMMUNITY): Payer: BC Managed Care – PPO

## 2013-07-19 DIAGNOSIS — E7800 Pure hypercholesterolemia, unspecified: Secondary | ICD-10-CM | POA: Insufficient documentation

## 2013-07-19 DIAGNOSIS — I10 Essential (primary) hypertension: Secondary | ICD-10-CM | POA: Insufficient documentation

## 2013-07-19 DIAGNOSIS — F17200 Nicotine dependence, unspecified, uncomplicated: Secondary | ICD-10-CM | POA: Insufficient documentation

## 2013-07-19 DIAGNOSIS — M129 Arthropathy, unspecified: Secondary | ICD-10-CM | POA: Insufficient documentation

## 2013-07-19 DIAGNOSIS — R6883 Chills (without fever): Secondary | ICD-10-CM | POA: Insufficient documentation

## 2013-07-19 DIAGNOSIS — R109 Unspecified abdominal pain: Secondary | ICD-10-CM

## 2013-07-19 DIAGNOSIS — R1032 Left lower quadrant pain: Secondary | ICD-10-CM | POA: Insufficient documentation

## 2013-07-19 DIAGNOSIS — K921 Melena: Secondary | ICD-10-CM | POA: Insufficient documentation

## 2013-07-19 DIAGNOSIS — Z79899 Other long term (current) drug therapy: Secondary | ICD-10-CM | POA: Insufficient documentation

## 2013-07-19 LAB — COMPREHENSIVE METABOLIC PANEL
ALT: 14 U/L (ref 0–35)
AST: 13 U/L (ref 0–37)
Alkaline Phosphatase: 40 U/L (ref 39–117)
CO2: 25 mEq/L (ref 19–32)
Chloride: 101 mEq/L (ref 96–112)
GFR calc non Af Amer: >90 mL/min (ref >90)
Potassium: 3.5 mEq/L (ref 3.5–5.1)
Sodium: 137 mEq/L (ref 135–145)
Total Bilirubin: 0.5 mg/dL (ref 0.3–1.2)

## 2013-07-19 LAB — CBC WITH DIFFERENTIAL/PLATELET
Basophils Absolute: 0.1 10*3/uL (ref 0.0–0.1)
Lymphocytes Relative: 26 % (ref 12–46)
Neutro Abs: 6.6 10*3/uL (ref 1.7–7.7)
Platelets: 301 10*3/uL (ref 150–400)
RDW: 13.8 % (ref 11.5–15.5)
WBC: 9.9 10*3/uL (ref 4.0–10.5)

## 2013-07-19 MED ORDER — MORPHINE SULFATE 2 MG/ML IJ SOLN
1.0000 mg | INTRAMUSCULAR | Status: DC | PRN
  Administered 2013-07-19: 1 mg via INTRAVENOUS
  Filled 2013-07-19: qty 1

## 2013-07-19 MED ORDER — IOHEXOL 300 MG/ML  SOLN
50.0000 mL | Freq: Once | INTRAMUSCULAR | Status: AC | PRN
  Administered 2013-07-19: 50 mL via ORAL

## 2013-07-19 MED ORDER — IOHEXOL 300 MG/ML  SOLN
100.0000 mL | Freq: Once | INTRAMUSCULAR | Status: AC | PRN
  Administered 2013-07-19: 100 mL via INTRAVENOUS

## 2013-07-19 NOTE — ED Notes (Signed)
Pt alert, nad, arrives from home, c/o "colon infection", sent per GI doctor, resp even unlabored, skin pwd

## 2013-07-19 NOTE — ED Provider Notes (Signed)
I saw and evaluated the patient, reviewed the resident's note and I agree with the findings and plan.  EKG Interpretation   None       Mild LLQ tenderness. Ct scan without acute pathology. Will refer back to GI at this time. May benefit from outpatient colonoscopy. Continue abx. Home with symptomatic control  Ct Abdomen Pelvis W Contrast  07/19/2013   CLINICAL DATA:  Left lower quadrant pain  EXAM: CT ABDOMEN AND PELVIS WITH CONTRAST  TECHNIQUE: Multidetector CT imaging of the abdomen and pelvis was performed using the standard protocol following bolus administration of intravenous contrast.  CONTRAST:  OMNIPAQUE IOHEXOL 300 MG/ML  SOLN  COMPARISON:  CT 10/08/2012  FINDINGS: Lung bases are clear.  Small area of focal fatty infiltration adjacent to the falciform ligament, unchanged. No liver mass lesion. Gallbladder and bile ducts are normal. Pancreas and spleen are normal. Surgical clips posterior to the stomach. Kidneys show no obstruction mass or calculi. Mild nodularity of the left adrenal gland which is unchanged and not significantly enlarged.  Negative for bowel obstruction or bowel thickening. Umbilical hernia measuring 4.5 cm in width. This contains bowel loops and is unchanged from the prior study. No evidence of surrounding edema. Appendix is normal. Small stump of appendix which is not inflamed.  Negative for mass or adenopathy.  No free fluid.  Hysterectomy.  IMPRESSION: Umbilical hernia containing bowel loops unchanged. No bowel obstruction or thickening.  No   acute abnormality.   Electronically Signed   By: Marlan Palau M.D.   On: 07/19/2013 09:43  I personally reviewed the imaging tests through PACS system I reviewed available ER/hospitalization records through the EMR    Lyanne Co, MD 07/19/13 1022

## 2013-07-19 NOTE — ED Provider Notes (Signed)
CSN: [id number]     Arrival date & time 07/19/13  0708 History   First MD Initiated Contact with Patient 07/19/13 [phone]     Chief Complaint  Patient presents with  . GI Problem    Sent per Dr Loreta Ave   (Consider location/radiation/quality/duration/timing/severity/associated sxs/prior Treatment) HPI Comments: Ms. Lackey is a 62 year old woman with PMH of HTN and diverticular disease presenting with a 3 week history of BRBPR.  She also notes LLQ pain 3/10 but denies diarrhea.  She was evaluated by Dr. Loreta Ave for this complaint and prescribed Cipro and Flagyl 2 weeks ago.  She reports compliance with these medications and her symptoms initially improved until 3 days ago when the bleeding started again.  She also notes an elevated WBC at Dr. Kenna Gilbert and subjective fevers/chills.  She has had poor appetite but denies N/V, diarrhea, constipation, dysuria.  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    Patient is a 62 y.o. female presenting with GI illness.  GI Problem Associated symptoms include abdominal pain and chills. Pertinent negatives include no chest pain, fever, nausea or vomiting.    Past Medical History  Diagnosis Date  . Diverticulitis   . Cervical spinal stenosis   . Hypertension   . Hypercholesteremia   . Arthritis   . Complication of anesthesia     "makes me cry for days."  . Family history of anesthesia complication     Sister has nausea and vomitting.   Past Surgical History  Procedure Laterality Date  . Thoracic fusion  1992  . Beningn tumor removal      small intestine  . Anterior cervical decomp/discectomy fusion  12/30/11  . Abdominal hysterectomy  1995  . Colectomy  2004    "lost 1/3 of my colon"  . Back surgery  1992    Family History  Problem Relation Age of Onset  . Anesthesia problems Sister    History  Substance Use Topics  . Smoking status: Current Every  Day Smoker -- 0.50 packs/day for 40 years    Types: Cigarettes  . Smokeless tobacco: Never Used  . Alcohol Use: No   OB History   Grav Para Term Preterm Abortions TAB SAB Ect Mult Living                 Review of Systems  Constitutional: Positive for chills and appetite change. Negative for fever.  Respiratory: Negative for shortness of breath.   Cardiovascular: Negative for chest pain.  Gastrointestinal: Positive for abdominal pain and blood in stool. Negative for nausea, vomiting, diarrhea and constipation.  Genitourinary: Negative for dysuria and hematuria.    Allergies  Review of patient's allergies indicates no known allergies.  Home Medications   Current Outpatient Rx  Name  Route  Sig  Dispense  Refill  . Ascorbic Acid (VITAMIN C PO)   Oral   Take 1 tablet by mouth 3 (three) times a week.          . ferrous sulfate 325 (65 FE) MG tablet   Oral   Take 325 mg by mouth 3 (three) times a week.         Marland Kitchen GLUCOSAMINE PO   Oral   Take 1 tablet by mouth 3 (three) times a week.         Marland Kitchen HYDROcodone-acetaminophen (NORCO) 10-325 MG per tablet   Oral   Take 1-2 tablets  by mouth every 6 (six) hours as needed. For pain         . Multiple Vitamins-Minerals (MULTIVITAMIN WITH MINERALS) tablet   Oral   Take 1 tablet by mouth 3 (three) times a week.          Ethelda Chick Calcium ([facility] OYSTER SHELL CALCIUM) 500 MG TABS   Oral   Take 1 tablet by mouth 3 (three) times a week.         . simvastatin (ZOCOR) 40 MG tablet   Oral   Take 40 mg by mouth every evening.         . valsartan-hydrochlorothiazide (DIOVAN-HCT) 80-12.5 MG per tablet   Oral   Take 1 tablet by mouth every morning.           BP 135/75  Pulse 74  Temp(Src) 98.5 F (36.9 C) (Oral)  Wt 200 lb (90.719 kg)  SpO2 95% Physical Exam  Constitutional: She is oriented to person, place, and time. She appears well-developed and well-nourished. No distress.  HENT:  Head: Normocephalic and  atraumatic.  Mouth/Throat: Oropharynx is clear and moist. No oropharyngeal exudate.  Eyes: EOM are normal. Pupils are equal, round, and reactive to light. No scleral icterus.  Neck: Neck supple.  Cardiovascular: Normal rate, regular rhythm and normal heart sounds.   Pulmonary/Chest: Effort normal and breath sounds normal. No respiratory distress. She has no wheezes. She has no rales.  Abdominal: Soft. Bowel sounds are normal. She exhibits no distension. There is tenderness. There is no guarding.  LLQ TTP  Musculoskeletal: She exhibits no edema and no tenderness.  Neurological: She is alert and oriented to person, place, and time.  Skin: She is not diaphoretic.    ED Course  Procedures (including critical care time) Labs Review Labs Reviewed  CBC WITH DIFFERENTIAL  COMPREHENSIVE METABOLIC PANEL   Imaging Review No results found.  EKG Interpretation   None       MDM  62 year old with PMH of diverticular disease presenting with symptoms refractory to antibiotic therapy.  Her presentation is concerning for diverticular abscess.  Will obtain CBC, CMP and CT abdomen.  Morphine for pain control.  Labs unremarkable - no leukocytosis or anemia.  CT without acute abnormality.  Patient afebrile, Hgb normal and she is stable for discharge home.  Patient instructed to follow-up with her gastroenterologist, Dr. Loreta Ave as soon as possible.  Patient instructed to continue antibiotics until she can follow-up with Dr. Loreta Ave.  She has Norco for pain if needed.  She was instructed to return to the ED with worsening symptoms, including fevers/chills, vomiting, increased rectal bleeding or abdominal pain.  Evelena Peat, DO 07/19/13 1017

## 2013-12-11 ENCOUNTER — Other Ambulatory Visit: Payer: Self-pay

## 2013-12-11 ENCOUNTER — Encounter (INDEPENDENT_AMBULATORY_CARE_PROVIDER_SITE_OTHER): Payer: Self-pay

## 2013-12-11 ENCOUNTER — Ambulatory Visit
Admission: RE | Admit: 2013-12-11 | Discharge: 2013-12-11 | Disposition: A | Discharge status: Home / Self Care | Payer: BC Managed Care – PPO | Source: Ambulatory Visit

## 2013-12-11 DIAGNOSIS — Z1231 Encounter for screening mammogram for malignant neoplasm of breast: Secondary | ICD-10-CM

## 2014-06-04 ENCOUNTER — Emergency Department (HOSPITAL_COMMUNITY)
Admission: EM | Admit: 2014-06-04 | Discharge: 2014-06-05 | Disposition: A | Discharge status: Home / Self Care | Payer: Medicare Other | Attending: Emergency Medicine | Admitting: Emergency Medicine

## 2014-06-04 ENCOUNTER — Encounter (HOSPITAL_COMMUNITY): Payer: Self-pay | Admitting: Emergency Medicine

## 2014-06-04 ENCOUNTER — Emergency Department (HOSPITAL_COMMUNITY): Payer: Medicare Other

## 2014-06-04 DIAGNOSIS — I10 Essential (primary) hypertension: Secondary | ICD-10-CM | POA: Insufficient documentation

## 2014-06-04 DIAGNOSIS — Z720 Tobacco use: Secondary | ICD-10-CM | POA: Insufficient documentation

## 2014-06-04 DIAGNOSIS — N3000 Acute cystitis without hematuria: Secondary | ICD-10-CM | POA: Diagnosis not present

## 2014-06-04 DIAGNOSIS — R197 Diarrhea, unspecified: Secondary | ICD-10-CM | POA: Diagnosis not present

## 2014-06-04 DIAGNOSIS — R1032 Left lower quadrant pain: Secondary | ICD-10-CM | POA: Diagnosis present

## 2014-06-04 DIAGNOSIS — Z8719 Personal history of other diseases of the digestive system: Secondary | ICD-10-CM | POA: Insufficient documentation

## 2014-06-04 DIAGNOSIS — Z792 Long term (current) use of antibiotics: Secondary | ICD-10-CM | POA: Insufficient documentation

## 2014-06-04 DIAGNOSIS — Z79899 Other long term (current) drug therapy: Secondary | ICD-10-CM | POA: Diagnosis not present

## 2014-06-04 DIAGNOSIS — E780 Pure hypercholesterolemia: Secondary | ICD-10-CM | POA: Diagnosis not present

## 2014-06-04 DIAGNOSIS — M1990 Unspecified osteoarthritis, unspecified site: Secondary | ICD-10-CM | POA: Insufficient documentation

## 2014-06-04 LAB — CBC WITH DIFFERENTIAL/PLATELET
BASOS PCT: 1 % (ref 0–1)
Basophils Absolute: 0.1 10*3/uL (ref 0.0–0.1)
Eosinophils Absolute: 0.1 10*3/uL (ref 0.0–0.7)
Eosinophils Relative: 1 % (ref 0–5)
HCT: 44 % (ref 36.0–46.0)
Hemoglobin: 15.1 g/dL — ABNORMAL HIGH (ref 12.0–15.0)
Lymphocytes Relative: 30 % (ref 12–46)
Lymphs Abs: 4.2 10*3/uL — ABNORMAL HIGH (ref 0.7–4.0)
MCH: 32 pg (ref 26.0–34.0)
MCHC: 34.3 g/dL (ref 30.0–36.0)
MCV: 93.2 fL (ref 78.0–100.0)
Monocytes Absolute: 1 10*3/uL (ref 0.1–1.0)
Monocytes Relative: 7 % (ref 3–12)
NEUTROS ABS: 8.5 10*3/uL — AB (ref 1.7–7.7)
NEUTROS PCT: 61 % (ref 43–77)
Platelets: 299 10*3/uL (ref 150–400)
RBC: 4.72 MIL/uL (ref 3.87–5.11)
RDW: 13.7 % (ref 11.5–15.5)
WBC: 13.9 10*3/uL — ABNORMAL HIGH (ref 4.0–10.5)

## 2014-06-04 LAB — I-STAT TROPONIN, ED: TROPONIN I, POC: 0 ng/mL (ref 0.00–0.08)

## 2014-06-04 LAB — URINE MICROSCOPIC-ADD ON

## 2014-06-04 LAB — URINALYSIS, ROUTINE W REFLEX MICROSCOPIC
Bilirubin Urine: NEGATIVE
GLUCOSE, UA: NEGATIVE mg/dL
KETONES UR: NEGATIVE mg/dL
Nitrite: NEGATIVE
Protein, ur: 100 mg/dL — AB
Specific Gravity, Urine: 1.019 (ref 1.005–1.030)
Urobilinogen, UA: 0.2 mg/dL (ref 0.0–1.0)
pH: 5 (ref 5.0–8.0)

## 2014-06-04 LAB — I-STAT CG4 LACTIC ACID, ED: LACTIC ACID, VENOUS: 1.84 mmol/L (ref 0.5–2.2)

## 2014-06-04 LAB — COMPREHENSIVE METABOLIC PANEL
ALBUMIN: 4 g/dL (ref 3.5–5.2)
ALT: 22 U/L (ref 0–35)
AST: 24 U/L (ref 0–37)
Alkaline Phosphatase: 41 U/L (ref 39–117)
Anion gap: 18 — ABNORMAL HIGH (ref 5–15)
BILIRUBIN TOTAL: 0.7 mg/dL (ref 0.3–1.2)
BUN: 11 mg/dL (ref 6–23)
CHLORIDE: 101 meq/L (ref 96–112)
CO2: 22 mEq/L (ref 19–32)
CREATININE: 0.87 mg/dL (ref 0.50–1.10)
Calcium: 9.2 mg/dL (ref 8.4–10.5)
GFR calc Af Amer: 79 mL/min — ABNORMAL LOW (ref >90)
GFR calc non Af Amer: 68 mL/min — ABNORMAL LOW (ref >90)
Glucose, Bld: 102 mg/dL — ABNORMAL HIGH (ref 70–99)
Potassium: 3.6 mEq/L — ABNORMAL LOW (ref 3.7–5.3)
Sodium: 141 mEq/L (ref 137–147)
Total Protein: 8.6 g/dL — ABNORMAL HIGH (ref 6.0–8.3)

## 2014-06-04 MED ORDER — PIPERACILLIN-TAZOBACTAM 3.375 G IVPB 30 MIN
3.3750 g | Freq: Once | INTRAVENOUS | Status: AC
  Administered 2014-06-04: 3.375 g via INTRAVENOUS
  Filled 2014-06-04: qty 50

## 2014-06-04 MED ORDER — MORPHINE SULFATE 4 MG/ML IJ SOLN
4.0000 mg | Freq: Once | INTRAMUSCULAR | Status: AC
  Administered 2014-06-04: 4 mg via INTRAVENOUS
  Filled 2014-06-04: qty 1

## 2014-06-04 MED ORDER — CEPHALEXIN 500 MG PO CAPS: 500.0000 mg | ORAL_CAPSULE | Freq: Four times a day (QID) | ORAL | Status: DC

## 2014-06-04 MED ORDER — ONDANSETRON 8 MG PO TBDP
8.0000 mg | ORAL_TABLET | Freq: Once | ORAL | Status: AC
  Administered 2014-06-04: 8 mg via ORAL
  Filled 2014-06-04: qty 1

## 2014-06-04 MED ORDER — SODIUM CHLORIDE 0.9 % IV BOLUS (SEPSIS)
1000.0000 mL | Freq: Once | INTRAVENOUS | Status: AC
  Administered 2014-06-04: 1000 mL via INTRAVENOUS

## 2014-06-04 MED ORDER — IOHEXOL 300 MG/ML  SOLN
25.0000 mL | Freq: Once | INTRAMUSCULAR | Status: AC | PRN
  Administered 2014-06-04: 25 mL via ORAL

## 2014-06-04 MED ORDER — IOHEXOL 300 MG/ML  SOLN
100.0000 mL | Freq: Once | INTRAMUSCULAR | Status: AC | PRN
  Administered 2014-06-04: 100 mL via INTRAVENOUS

## 2014-06-04 NOTE — ED Provider Notes (Addendum)
CSN: [id number]     Arrival date & time 06/04/14  1705 History   First MD Initiated Contact with Patient 06/04/14 1836     Chief Complaint  Patient presents with  . Abdominal Pain     (Consider location/radiation/quality/duration/timing/severity/associated sxs/prior Treatment) The history is provided by the patient.  [name] is a 63 y.o. female hx of HTN, HL, diverticulitis here with abdominal pain. She was diagnosed with diverticulitis on 10/7 and has been on Cipro and Flagyl. However she has been having persistent left lower quadrant pain as well as nausea. Some subjective chills but no fever. Pain is crampy with no radiation. Denies any urinary symptoms. Denies any chest pain or cough or shortness of breath.    Past Medical History  Diagnosis Date  . Diverticulitis   . Cervical spinal stenosis   . Hypertension   . Hypercholesteremia   . Arthritis   . Complication of anesthesia     "makes me cry for days."  . Family history of anesthesia complication     Sister has nausea and vomitting.   Past Surgical History  Procedure Laterality Date  . Thoracic fusion  1992  . Beningn tumor removal      small intestine  . Anterior cervical decomp/discectomy fusion  12/30/11  . Abdominal hysterectomy  1995  . Colectomy  2004    "lost 1/3 of my colon"  . Back surgery  1992    Family History  Problem Relation Age of Onset  . Anesthesia problems Sister    History  Substance Use Topics  . Smoking status: Current Every Day Smoker -- 0.50 packs/day for 40 years    Types: Cigarettes  . Smokeless tobacco: Never Used  . Alcohol Use: No   OB History   Grav Para Term Preterm Abortions TAB SAB Ect Mult Living                 Review of Systems  Gastrointestinal: Positive for abdominal pain and diarrhea.  All other systems reviewed and are negative.     Allergies  Review of patient's allergies indicates no known allergies.  Home Medications   Prior to Admission  medications   Medication Sig Start Date End Date Taking? Authorizing Provider  Ascorbic Acid (VITAMIN C PO) Take 1 tablet by mouth 3 (three) times a week.    Yes Historical Provider, MD  ciprofloxacin (CIPRO) 500 MG tablet Take 500 mg by mouth 2 (two) times daily.   Yes Historical Provider, MD  ferrous sulfate 325 (65 FE) MG tablet Take 325 mg by mouth 3 (three) times a week.   Yes Historical Provider, MD  GLUCOSAMINE PO Take 1 tablet by mouth 3 (three) times a week.   Yes Historical Provider, MD  HYDROcodone-acetaminophen (NORCO) 10-325 MG per tablet Take 1-2 tablets by mouth every 6 (six) hours as needed. For pain 01/01/12  Yes Charlie Pitter, MD  metroNIDAZOLE (FLAGYL) 250 MG tablet Take 250 mg by mouth 3 (three) times daily.   Yes Historical Provider, MD  Multiple Vitamins-Minerals (MULTIVITAMIN WITH MINERALS) tablet Take 1 tablet by mouth 3 (three) times a week.    Yes Historical Provider, MD  Loma Boston Calcium ([employer] OYSTER SHELL CALCIUM) 500 MG TABS Take 1 tablet by mouth 3 (three) times a week.   Yes Historical Provider, MD  simvastatin (ZOCOR) 40 MG tablet Take 40 mg by mouth every evening.   Yes Historical Provider, MD  valsartan-hydrochlorothiazide (DIOVAN-HCT) 80-12.5 MG per tablet Take 0.5  tablets by mouth every morning.    Yes Historical Provider, MD   BP 124/76  Pulse 93  Temp(Src) 98.7 F (37.1 C) (Oral)  Resp 24  SpO2 98% Physical Exam  Nursing note and vitals reviewed. Constitutional: She is oriented to person, place, and time.  Uncomfortable   HENT:  Head: Normocephalic.  Mouth/Throat: Oropharynx is clear and moist.  Eyes: Conjunctivae are normal. Pupils are equal, round, and reactive to light.  Neck: Normal range of motion. Neck supple.  Cardiovascular: Normal rate, regular rhythm and normal heart sounds.   Pulmonary/Chest: Effort normal and breath sounds normal. No respiratory distress. She has no wheezes. She has no rales.  Abdominal: Soft.  + LLQ more than RLQ  tenderness. No rebound   Musculoskeletal: Normal range of motion. She exhibits no edema and no tenderness.  Neurological: She is alert and oriented to person, place, and time. No cranial nerve deficit. Coordination normal.  Skin: Skin is warm and dry.  Psychiatric: She has a normal mood and affect. Her behavior is normal. Judgment and thought content normal.    ED Course  Procedures (including critical care time) Labs Review Labs Reviewed  CBC WITH DIFFERENTIAL - Abnormal; Notable for the following:    WBC 13.9 (*)    Hemoglobin 15.1 (*)    Neutro Abs 8.5 (*)    Lymphs Abs 4.2 (*)    All other components within normal limits  COMPREHENSIVE METABOLIC PANEL - Abnormal; Notable for the following:    Potassium 3.6 (*)    Glucose, Bld 102 (*)    Total Protein 8.6 (*)    GFR calc non Af Amer 68 (*)    GFR calc Af Amer 79 (*)    Anion gap 18 (*)    All other components within normal limits  URINALYSIS, ROUTINE W REFLEX MICROSCOPIC - Abnormal; Notable for the following:    Color, Urine AMBER (*)    APPearance TURBID (*)    Hgb urine dipstick TRACE (*)    Protein, ur 100 (*)    Leukocytes, UA SMALL (*)    All other components within normal limits  URINE MICROSCOPIC-ADD ON - Abnormal; Notable for the following:    Squamous Epithelial / LPF FEW (*)    Bacteria, UA MANY (*)    Casts HYALINE CASTS (*)    All other components within normal limits  URINE CULTURE  I-STAT CG4 LACTIC ACID, ED  I-STAT TROPOININ, ED    Imaging Review Ct Abdomen Pelvis W Contrast  06/04/2014   CLINICAL DATA:  History of diverticulitis, significant lower abdominal pain and nausea, left lower quadrant pain, possible diverticulitis.  EXAM: CT ABDOMEN AND PELVIS WITH CONTRAST  TECHNIQUE: Multidetector CT imaging of the abdomen and pelvis was performed using the standard protocol following bolus administration of intravenous contrast.  CONTRAST:  80mL OMNIPAQUE IOHEXOL 300 MG/ML SOLN, 166mL OMNIPAQUE IOHEXOL 300  MG/ML SOLN  COMPARISON:  07/19/2013  FINDINGS: Lung bases are unremarkable. Sagittal images of the spine shows degenerative changes lumbar spine. Significant disc space flattening with vacuum disc phenomenon at L5-S1 level. Umbilical region hernia containing small bowel loops again noted without evidence of acute complication. Mild hepatic fatty infiltration. Stable postsurgical changes in left upper quadrant. Mild distended gallbladder without evidence of calcified gallstones. Atherosclerotic calcifications of abdominal aorta and iliac arteries.  Mild atrophic pancreas. The spleen and adrenal glands are unremarkable.  Kidneys are symmetrical in size and enhancement. No hydronephrosis or hydroureter. Stable cyst in midpole of the  left kidney measures 1.1 cm.  Delayed renal images shows bilateral renal symmetrical excretion.  There is no pericecal inflammation. Few diverticula are noted right colon. No evidence of acute diverticulitis.  The terminal ileum is unremarkable. Normal appendix partially visualized in axial image 53.  There is no evidence of distal colitis or diverticulitis.  The patient is status post hysterectomy. Mild distended urinary bladder without as of calcified calculi. Some liquid stool noted within rectum. There is a tiny midline anterior pelvic wall hernia containing fat axial image 71 measures about 2.2 cm without evidence of acute complication.  IMPRESSION: 1. Mild hepatic fatty infiltration. 2. There is a cyst in mid to lower pole of the left kidney measures 1 cm stable from prior exam. No hydronephrosis or hydroureter. 3. No small bowel obstruction. 4. Stable umbilical region hernia containing small bowel loops without evidence of acute complication. 5. No evidence of colitis or diverticulitis. No pericecal inflammation. Normal appendix partially visualized. 6. Status post hysterectomy. Small midline anterior pelvic wall hernia containing fat without evidence of acute complication    Electronically Signed   By: Lahoma Crocker M.D.   On: 06/04/2014 22:56     EKG Interpretation None      MDM   Final diagnoses:  None    [name] is a 63 y.o. female here with ab pain, nausea. Will need to r/o complicated diverticulitis. Will get labs, CT ab/pel, UA. Will reassess.   11:18 PM CT showed no diverticulitis. WBC 14. UA + UTI. I think she may have complicated UTI vs pyelo. Given zosyn. Will stop cipro/flagyl. Will switch to keflex. Tolerated PO. Urine culture sent.    Wandra Arthurs, MD 06/04/14 [id number]  Wandra Arthurs, MD 06/04/14 [phone]

## 2014-06-04 NOTE — ED Notes (Signed)
Pt reports hx of diverticulitis and has been on abx since 10/7 without relief.  Pt reports severe low abd pain with nausea.

## 2014-06-04 NOTE — Discharge Instructions (Signed)
Take keflex for a week.   Stop cipro, flagyl.   Continue taking your hydrocodone for pain.   Follow up with your doctor.   Return to ER if you have severe pain, fever, vomiting.

## 2014-06-06 LAB — URINE CULTURE

## 2014-06-09 ENCOUNTER — Emergency Department (HOSPITAL_COMMUNITY)
Admission: EM | Admit: 2014-06-09 | Discharge: 2014-06-09 | Discharge status: Against Medical Advice | Payer: Medicare Other | Attending: Emergency Medicine | Admitting: Emergency Medicine

## 2014-06-09 ENCOUNTER — Encounter (HOSPITAL_COMMUNITY): Payer: Self-pay | Admitting: Emergency Medicine

## 2014-06-09 DIAGNOSIS — R110 Nausea: Secondary | ICD-10-CM | POA: Diagnosis not present

## 2014-06-09 DIAGNOSIS — Z720 Tobacco use: Secondary | ICD-10-CM | POA: Diagnosis not present

## 2014-06-09 DIAGNOSIS — R197 Diarrhea, unspecified: Secondary | ICD-10-CM | POA: Diagnosis not present

## 2014-06-09 DIAGNOSIS — R1032 Left lower quadrant pain: Secondary | ICD-10-CM | POA: Diagnosis present

## 2014-06-09 DIAGNOSIS — I10 Essential (primary) hypertension: Secondary | ICD-10-CM | POA: Insufficient documentation

## 2014-06-09 NOTE — ED Notes (Signed)
Pt c/o intermittent LLQ abdominal pain, nausea, and diarrhea x a couple months.  Pain score 7/10.  Pt was seen at [facility] x 5 days ago and diagnosed w/ cystitis.  Pt reports taking medications as prescribed, but symptoms continue to increase.

## 2014-06-09 NOTE — ED Notes (Signed)
Pt reports she will not wait any longer. Apologized to pt about long wait times and attempted to have her stay, but she refused.

## 2014-06-11 ENCOUNTER — Emergency Department (HOSPITAL_COMMUNITY)
Admission: EM | Admit: 2014-06-11 | Discharge: 2014-06-12 | Disposition: A | Discharge status: Home / Self Care | Payer: Medicare Other | Attending: Emergency Medicine | Admitting: Emergency Medicine

## 2014-06-11 ENCOUNTER — Encounter (HOSPITAL_COMMUNITY): Payer: Self-pay | Admitting: Emergency Medicine

## 2014-06-11 ENCOUNTER — Emergency Department (HOSPITAL_COMMUNITY): Payer: Medicare Other

## 2014-06-11 DIAGNOSIS — Z792 Long term (current) use of antibiotics: Secondary | ICD-10-CM | POA: Diagnosis not present

## 2014-06-11 DIAGNOSIS — Z720 Tobacco use: Secondary | ICD-10-CM | POA: Diagnosis not present

## 2014-06-11 DIAGNOSIS — Z8719 Personal history of other diseases of the digestive system: Secondary | ICD-10-CM | POA: Diagnosis not present

## 2014-06-11 DIAGNOSIS — I10 Essential (primary) hypertension: Secondary | ICD-10-CM | POA: Diagnosis not present

## 2014-06-11 DIAGNOSIS — Z90710 Acquired absence of both cervix and uterus: Secondary | ICD-10-CM | POA: Diagnosis not present

## 2014-06-11 DIAGNOSIS — Z9089 Acquired absence of other organs: Secondary | ICD-10-CM | POA: Diagnosis not present

## 2014-06-11 DIAGNOSIS — Z87440 Personal history of urinary (tract) infections: Secondary | ICD-10-CM | POA: Insufficient documentation

## 2014-06-11 DIAGNOSIS — R109 Unspecified abdominal pain: Secondary | ICD-10-CM

## 2014-06-11 DIAGNOSIS — M1990 Unspecified osteoarthritis, unspecified site: Secondary | ICD-10-CM | POA: Diagnosis not present

## 2014-06-11 DIAGNOSIS — E780 Pure hypercholesterolemia: Secondary | ICD-10-CM | POA: Diagnosis not present

## 2014-06-11 DIAGNOSIS — R1032 Left lower quadrant pain: Secondary | ICD-10-CM | POA: Diagnosis present

## 2014-06-11 LAB — CBC WITH DIFFERENTIAL/PLATELET
BASOS ABS: 0.1 10*3/uL (ref 0.0–0.1)
BASOS PCT: 1 % (ref 0–1)
EOS ABS: 0.2 10*3/uL (ref 0.0–0.7)
Eosinophils Relative: 1 % (ref 0–5)
HCT: 42.3 % (ref 36.0–46.0)
Hemoglobin: 14.6 g/dL (ref 12.0–15.0)
Lymphocytes Relative: 27 % (ref 12–46)
Lymphs Abs: 3.2 10*3/uL (ref 0.7–4.0)
MCH: 32.7 pg (ref 26.0–34.0)
MCHC: 34.5 g/dL (ref 30.0–36.0)
MCV: 94.8 fL (ref 78.0–100.0)
MONO ABS: 0.8 10*3/uL (ref 0.1–1.0)
MONOS PCT: 7 % (ref 3–12)
NEUTROS ABS: 7.4 10*3/uL (ref 1.7–7.7)
Neutrophils Relative %: 64 % (ref 43–77)
Platelets: 271 10*3/uL (ref 150–400)
RBC: 4.46 MIL/uL (ref 3.87–5.11)
RDW: 13.9 % (ref 11.5–15.5)
WBC: 11.6 10*3/uL — ABNORMAL HIGH (ref 4.0–10.5)

## 2014-06-11 LAB — COMPREHENSIVE METABOLIC PANEL
ALBUMIN: 3.8 g/dL (ref 3.5–5.2)
ALT: 19 U/L (ref 0–35)
ANION GAP: 11 (ref 5–15)
AST: 18 U/L (ref 0–37)
Alkaline Phosphatase: 47 U/L (ref 39–117)
BILIRUBIN TOTAL: 0.5 mg/dL (ref 0.3–1.2)
BUN: 10 mg/dL (ref 6–23)
CHLORIDE: 103 meq/L (ref 96–112)
CO2: 28 mEq/L (ref 19–32)
Calcium: 9.4 mg/dL (ref 8.4–10.5)
Creatinine, Ser: 0.82 mg/dL (ref 0.50–1.10)
GFR calc Af Amer: 85 mL/min — ABNORMAL LOW (ref >90)
GFR calc non Af Amer: 73 mL/min — ABNORMAL LOW (ref >90)
Glucose, Bld: 94 mg/dL (ref 70–99)
Potassium: 4.7 mEq/L (ref 3.7–5.3)
Sodium: 142 mEq/L (ref 137–147)
TOTAL PROTEIN: 8.3 g/dL (ref 6.0–8.3)

## 2014-06-11 LAB — URINALYSIS, ROUTINE W REFLEX MICROSCOPIC
BILIRUBIN URINE: NEGATIVE
Glucose, UA: NEGATIVE mg/dL
KETONES UR: NEGATIVE mg/dL
NITRITE: NEGATIVE
PROTEIN: NEGATIVE mg/dL
Specific Gravity, Urine: 1.003 — ABNORMAL LOW (ref 1.005–1.030)
Urobilinogen, UA: 0.2 mg/dL (ref 0.0–1.0)
pH: 6.5 (ref 5.0–8.0)

## 2014-06-11 LAB — URINE MICROSCOPIC-ADD ON

## 2014-06-11 MED ORDER — IOHEXOL 300 MG/ML  SOLN
100.0000 mL | Freq: Once | INTRAMUSCULAR | Status: AC | PRN
  Administered 2014-06-11: 100 mL via INTRAVENOUS

## 2014-06-11 MED ORDER — MORPHINE SULFATE 4 MG/ML IJ SOLN
6.0000 mg | Freq: Once | INTRAMUSCULAR | Status: AC
  Administered 2014-06-11: 6 mg via INTRAVENOUS
  Filled 2014-06-11: qty 2

## 2014-06-11 MED ORDER — DEXTROSE 5 % IV SOLN
1.0000 g | Freq: Once | INTRAVENOUS | Status: AC
  Administered 2014-06-11: 1 g via INTRAVENOUS
  Filled 2014-06-11: qty 10

## 2014-06-11 MED ORDER — SODIUM CHLORIDE 0.9 % IV BOLUS (SEPSIS)
1000.0000 mL | Freq: Once | INTRAVENOUS | Status: AC
  Administered 2014-06-11: 1000 mL via INTRAVENOUS

## 2014-06-11 MED ORDER — ONDANSETRON HCL 4 MG/2ML IJ SOLN
4.0000 mg | Freq: Once | INTRAMUSCULAR | Status: AC
  Administered 2014-06-11: 4 mg via INTRAVENOUS
  Filled 2014-06-11: qty 2

## 2014-06-11 MED ORDER — PROMETHAZINE HCL 25 MG PO TABS: 25.0000 mg | ORAL_TABLET | Freq: Three times a day (TID) | ORAL | Status: DC | PRN

## 2014-06-11 NOTE — Discharge Instructions (Signed)
Return here as needed. Follow up with your GI doctor. Increase your fluid intake.

## 2014-06-11 NOTE — ED Notes (Signed)
Pt was here a week ago and dx with cystitis.  Pt also came in two days ago but left without being seen. Pt states abdominal pain seems worse.

## 2014-06-11 NOTE — ED Provider Notes (Signed)
CSN: [id number]     Arrival date & time 06/11/14  1552 History   First MD Initiated Contact with Patient 06/11/14 1814     Chief Complaint  Patient presents with  . Abdominal Pain     (Consider location/radiation/quality/duration/timing/severity/associated sxs/prior Treatment) HPI Patient presents to the emergency department with a one-week history of abdominal pain in the left lower quadrant.  Patient states she was diagnosed with a UTI and had a negative CT scan 1 week ago.  The patient states she followed up with her primary care doctor, who reassured her that this was most likely not a significant intra-abdominal process.  Patient states that she's not had any chest pain, shortness of breath, vomiting, diarrhea, weakness, dizziness, headache, blurred vision, back pain, dysuria, rectal bleeding or syncope.  Patient states that nothing seems to make her condition better or worse Past Medical History  Diagnosis Date  . Diverticulitis   . Cervical spinal stenosis   . Hypertension   . Hypercholesteremia   . Arthritis   . Complication of anesthesia     "makes me cry for days."  . Family history of anesthesia complication     Sister has nausea and vomitting.   Past Surgical History  Procedure Laterality Date  . Thoracic fusion  1992  . Beningn tumor removal      small intestine  . Anterior cervical decomp/discectomy fusion  12/30/11  . Abdominal hysterectomy  1995  . Colectomy  2004    "lost 1/3 of my colon"  . Back surgery  1992    Family History  Problem Relation Age of Onset  . Anesthesia problems Sister    History  Substance Use Topics  . Smoking status: Current Every Day Smoker -- 0.50 packs/day for 40 years    Types: Cigarettes  . Smokeless tobacco: Never Used  . Alcohol Use: No   OB History   Grav Para Term Preterm Abortions TAB SAB Ect Mult Living                 Review of Systems All other systems negative except as documented in the HPI. All pertinent  positives and negatives as reviewed in the HPI.    Allergies  Review of patient's allergies indicates no known allergies.  Home Medications   Prior to Admission medications   Medication Sig Start Date End Date Taking? Authorizing Provider  acetaminophen (TYLENOL) 500 MG tablet Take 1,000 mg by mouth every 6 (six) hours as needed (pain).   Yes Historical Provider, MD  Ascorbic Acid (VITAMIN C PO) Take 1 tablet by mouth 3 (three) times a week.    Yes Historical Provider, MD  cephALEXin (KEFLEX) 500 MG capsule Take 1 capsule (500 mg total) by mouth 4 (four) times daily. 06/04/14  Yes Wandra Arthurs, MD  ferrous sulfate 325 (65 FE) MG tablet Take 325 mg by mouth 3 (three) times a week.   Yes Historical Provider, MD  GLUCOSAMINE PO Take 1 tablet by mouth 3 (three) times a week.   Yes Historical Provider, MD  HYDROcodone-acetaminophen (NORCO) 10-325 MG per tablet Take 1 tablet by mouth every 6 (six) hours as needed (pain).   Yes Historical Provider, MD  Multiple Vitamins-Minerals (MULTIVITAMIN WITH MINERALS) tablet Take 1 tablet by mouth 3 (three) times a week.    Yes Historical Provider, MD  Loma Boston Calcium ([employer] OYSTER SHELL CALCIUM) 500 MG TABS Take 1 tablet by mouth 3 (three) times a week.   Yes Historical Provider, MD  Probiotic Product (PROBIOTIC DAILY PO) Take 1 tablet by mouth every other day.   Yes Historical Provider, MD  simvastatin (ZOCOR) 40 MG tablet Take 40 mg by mouth every evening.   Yes Historical Provider, MD  valsartan-hydrochlorothiazide (DIOVAN-HCT) 80-12.5 MG per tablet Take 0.5 tablets by mouth every morning.    Yes Historical Provider, MD   BP 137/78  Pulse 68  Temp(Src) 97.6 F (36.4 C) (Oral)  Resp 16  Ht 5\' 5"  (1.651 m)  Wt 208 lb (94.348 kg)  BMI 34.61 kg/m2  SpO2 97% Physical Exam  Constitutional: She appears well-developed and well-nourished. No distress.  HENT:  Head: Normocephalic and atraumatic.  Mouth/Throat: Oropharynx is clear and moist.  Eyes:  Pupils are equal, round, and reactive to light.  Neck: Normal range of motion. Neck supple.  Cardiovascular: Normal rate, regular rhythm and normal heart sounds.  Exam reveals no gallop and no friction rub.   No murmur heard. Pulmonary/Chest: Effort normal and breath sounds normal. No respiratory distress.  Abdominal: Soft. Normal appearance and bowel sounds are normal. She exhibits no distension. There is tenderness in the left lower quadrant. There is no rigidity, no rebound and no guarding.    ED Course  Procedures (including critical care time) Labs Review Labs Reviewed  URINALYSIS, ROUTINE W REFLEX MICROSCOPIC - Abnormal; Notable for the following:    APPearance CLOUDY (*)    Specific Gravity, Urine 1.003 (*)    Hgb urine dipstick TRACE (*)    Leukocytes, UA TRACE (*)    All other components within normal limits  CBC WITH DIFFERENTIAL - Abnormal; Notable for the following:    WBC 11.6 (*)    All other components within normal limits  COMPREHENSIVE METABOLIC PANEL - Abnormal; Notable for the following:    GFR calc non Af Amer 73 (*)    GFR calc Af Amer 85 (*)    All other components within normal limits  URINE MICROSCOPIC-ADD ON - Abnormal; Notable for the following:    Squamous Epithelial / LPF MANY (*)    Bacteria, UA FEW (*)    All other components within normal limits    Imaging Review Ct Abdomen Pelvis W Contrast  06/11/2014   CLINICAL DATA:  Cystitis.  Worsening abdominal pain.  EXAM: CT ABDOMEN AND PELVIS WITH CONTRAST  TECHNIQUE: Multidetector CT imaging of the abdomen and pelvis was performed using the standard protocol following bolus administration of intravenous contrast.  CONTRAST:  151mL OMNIPAQUE IOHEXOL 300 MG/ML  SOLN  COMPARISON:  06/04/2014  FINDINGS: Lower chest:  No pleural effusion.  The lung bases are clear.  Hepatobiliary: Mild hepatic steatosis. No focal liver abnormality. Area of focal fatty sparing is noted within the medial segment of the left hepatic  lobe, image 21/series 2. The gallbladder is normal. No biliary dilatation.  Pancreas: Normal appearance of the pancreas.  Spleen: The spleen is on unremarkable.  Adrenals/Urinary Tract: Normal appearance of the right adrenal gland. There is mild nodular enlargement of the left adrenal gland. Normal appearance of the right kidney. Cyst within the inferior pole of left kidney measures 1.2 cm. Urinary bladder appears normal.  Stomach/Bowel: The stomach appears normal. The small bowel loops have a normal caliber. There is a ventral abdominal wall hernia which contains a nonobstructed loop of small bowel, image 41/series 2. The terminal ileum and proximal colon appear normal. The appendix is visualized and is normal. The distal colon is unremarkable.  Vascular/Lymphatic: Calcified atherosclerotic disease involves the abdominal aorta. No  aneurysm. There is no retroperitoneal adenopathy. No pelvic or inguinal adenopathy identified.  Reproductive: Previous hysterectomy.  No adnexal mass.  Other: No free fluid or fluid collections within the abdomen or pelvis.  Musculoskeletal: Review of the visualized osseous structures is significant for mild scoliosis and multilevel degenerative disc disease.  IMPRESSION: 1. No acute findings within the abdomen or pelvis. 2. Hepatic steatosis.   Electronically Signed   By: Kerby Moors M.D.   On: 06/11/2014 21:09    Patient is advised to follow up with her GI doctor and primary care doctor, told to return here as needed.  The patient states she has pain medications at home and does not need any further assistance with this.  She does request antinausea medicine.  Patient is advised to return here for any worsening in her condition  MDM   Final diagnoses:  Abdominal pain, acute        Brent General, PA-C 06/12/14 0125

## 2014-06-13 NOTE — ED Provider Notes (Signed)
  This was a shared visit with a mid-level provided (NP or PA).  Throughout the patient's course I was available for consultation/collaboration.  I saw the ECG (if appropriate), relevant labs and studies - I agree with the interpretation.  On my exam the patient was in no distress.  Patient continued to have mild left-sided abdominal pain, but no evidence for bacteremia, peritonitis. Patient's evaluation was notable for reassuring CT, similar to findings from one week ago. Review of the patient's chart chemistries that she has had 5 CT scans of the abdomen and pelvis within the past 18 months, and 10 within the past 10 years. There is some suspicion for acute on chronic pain, given these studies, and today's reassuring findings. Absent distress, with stable vital signs, patient was discharged to follow-up with her physicians for further evaluation and management.      Carmin Muskrat, MD 06/13/14 [phone]

## 2014-06-15 ENCOUNTER — Telehealth: Payer: Self-pay | Admitting: Internal Medicine

## 2014-06-15 NOTE — Telephone Encounter (Signed)
Called about "infection" Having dark diarrhea 1 x a day and today saw slight red blood on TP  LLQ pain  ED visits 10/20 and 10/27 CT abd/pelvis x 2 is neg  Dx UTI 10/20  wants me to recommend Abx  I advised calling Dr. Collene Mares early Mon AM since patient says dr, Collene Mares is familiar with her and to return to ED if having severe abd pain/severe bleeding

## 2014-08-28 DIAGNOSIS — Z6835 Body mass index (BMI) 35.0-35.9, adult: Secondary | ICD-10-CM | POA: Insufficient documentation

## 2014-09-23 ENCOUNTER — Telehealth: Payer: Self-pay | Admitting: Neurology

## 2014-09-23 NOTE — Telephone Encounter (Signed)
Pt resch her new patient appt from 09-30-14 to 10-16-14 and i notified Dr Vertell Limber office

## 2014-09-30 ENCOUNTER — Ambulatory Visit: Payer: Self-pay | Admitting: Neurology

## 2014-10-16 ENCOUNTER — Encounter: Payer: Self-pay | Admitting: Neurology

## 2014-10-16 ENCOUNTER — Ambulatory Visit (INDEPENDENT_AMBULATORY_CARE_PROVIDER_SITE_OTHER): Payer: Medicare Other | Admitting: Neurology

## 2014-10-16 VITALS — BP 110/70 | HR 68 | Ht 65.5 in | Wt 214.0 lb

## 2014-10-16 DIAGNOSIS — G250 Essential tremor: Secondary | ICD-10-CM

## 2014-10-16 DIAGNOSIS — Z720 Tobacco use: Secondary | ICD-10-CM

## 2014-10-16 MED ORDER — PRIMIDONE 50 MG PO TABS: 50.0000 mg | ORAL_TABLET | Freq: Every day | ORAL | Status: DC

## 2014-10-16 NOTE — Patient Instructions (Signed)
1. Start Primidone 50 mg tablets. Take 1/2 tablet for 4 nights, then increase to 1 tablet. Prescription has been sent to your pharmacy. The first dose of medication can cause some dizziness/nausea that should go away after the first dose.   

## 2014-10-16 NOTE — Progress Notes (Signed)
Subjective:   [name] was seen in consultation in the movement disorder clinic at the request of Dr. Vertell Limber.  Her PCP is Osborne Casco, MD.  The evaluation is for tremor.    The patient is a 64 y.o. right handed female with a history of tremor.  Tremor is in "arms and hands."  She states that she has had L arm tremor for 5-10 years; she notices it when using the hand.  She states that she was thinking that it was due to a pinched nerve.  She saw Dr. Erling Cruz for it and was told that it was essential tremor.  She states that she had a cervical fusion 3 years ago and when "I woke up I couldn't move the right arm."  She states that tremor in the right arm started after that.  She was tried on lyrica but states that it made tremor on the L worse.  She states that gabapentin had a similar result.  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  There is no family hx of tremor.    Affected by caffeine: unknown, as doesn't drink much Affected by alcohol:  Unknown as doesn't drink Affected by stress:  No. Affected by fatigue:  No. Spills soup if on spoon:  Yes.   Spills glass of liquid if full:  Yes.   Affects ADL's (tying shoes, brushing teeth, etc): some (notices it when pulls up pants)  Current/Previously tried tremor medications: lyrica/gabapentin  Current medications that may exacerbate tremor:  n/a  Outside reports reviewed: historical medical records.  No Known Allergies  Outpatient Encounter Prescriptions as of 10/16/2014  Medication Sig  . acetaminophen (TYLENOL) 500 MG tablet Take 1,000 mg by mouth every 6 (six) hours as needed (pain).  . Ascorbic Acid (VITAMIN C PO) Take 1 tablet by mouth 3 (three) times a week.   . ferrous sulfate 325 (65 FE) MG tablet Take 325 mg by mouth 3 (three) times a week.  Marland Kitchen GLUCOSAMINE PO Take 1 tablet by mouth 3 (three) times  a week.  Marland Kitchen HYDROcodone-acetaminophen (NORCO) 10-325 MG per tablet Take 1 tablet by mouth every 6 (six) hours as needed (pain).  . Multiple Vitamins-Minerals (MULTIVITAMIN WITH MINERALS) tablet Take 1 tablet by mouth 3 (three) times a week.   Loma [place] Calcium ([employer] OYSTER SHELL CALCIUM) 500 MG TABS Take 1 tablet by mouth 3 (three) times a week.  . Probiotic Product (PROBIOTIC DAILY PO) Take 1 tablet by mouth every other day.  . simvastatin (ZOCOR) 40 MG tablet Take 40 mg by mouth every evening.  . valsartan-hydrochlorothiazide (DIOVAN-HCT) 80-12.5 MG per tablet Take 0.5 tablets by mouth every morning.   . [DISCONTINUED] cephALEXin (KEFLEX) 500 MG capsule Take 1 capsule (500 mg total) by mouth 4 (four) times daily.  . [DISCONTINUED] promethazine (PHENERGAN) 25 MG tablet Take 1 tablet (25 mg total) by mouth every 8 (eight) hours as needed for nausea or vomiting.    Past Medical History  Diagnosis Date  . Diverticulitis   . Cervical spinal stenosis   . Hypertension   . Hypercholesteremia   . Arthritis   . Complication of anesthesia     "makes me cry for days."  . Family history of anesthesia complication     Sister has nausea and vomitting.    Past Surgical  History  Procedure Laterality Date  . Thoracic fusion  1992  . Tumor removal      small intestine  . Anterior cervical decomp/discectomy fusion  12/30/11  . Abdominal hysterectomy  1995  . Colectomy  2004    "lost 1/3 of my colon"  . Back surgery  1992     History   Social History  . Marital Status: Divorced    Spouse Name: N/A  . Number of Children: N/A  . Years of Education: N/A   Occupational History  . retired     Press photographer, Teaching laboratory technician   Social History Main Topics  . Smoking status: Current Every Day Smoker -- 0.50 packs/day for 40 years    Types: Cigarettes  . Smokeless tobacco: Never Used  . Alcohol Use: No  . Drug Use: No  . Sexual Activity: No   Other Topics Concern  . Not on file   Social History  Narrative    Family Status  Relation Status Death Age  . Mother Deceased     healthy   . Father Deceased     healthy  . Sister Alive     healthy  . Son Alive     healthy    Review of Systems A complete 10 system ROS was obtained and was negative apart from what is mentioned.   Objective:   VITALS:   Filed Vitals:   10/16/14 1032  BP: 110/70  Pulse: 68  Height: 5' 5.5" (1.664 m)  Weight: 214 lb (97.07 kg)   Gen:  Appears stated age and in NAD. HEENT:  Normocephalic, atraumatic. The mucous membranes are moist. The superficial temporal arteries are without ropiness or tenderness. Cardiovascular: Regular rate and rhythm. Lungs: Clear to auscultation bilaterally. Neck: There are no carotid bruits noted bilaterally.  NEUROLOGICAL:  Orientation:  The patient is alert and oriented x 3.  Recent and remote memory are intact.  Attention span and concentration are normal.  Able to name objects and repeat without trouble.  Fund of knowledge is appropriate Cranial nerves: There is good facial symmetry. The pupils are equal round and reactive to light bilaterally. Fundoscopic exam reveals clear disc margins bilaterally. Extraocular muscles are intact and visual fields are full to confrontational testing. Speech is fluent and clear. Soft palate rises symmetrically and there is no tongue deviation. Hearing is intact to conversational tone. Tone: Tone is good throughout. Sensation: Sensation is intact to light touch and pinprick throughout (facial, trunk, extremities). Vibration is intact at the bilateral big toe. There is no extinction with double simultaneous stimulation. There is no sensory dermatomal level identified. Coordination:  The patient has no dysdiadichokinesia or dysmetria.  However, there are some non-physiologic aspects to hand opening/closing and to finger taps bilaterally.  Alternation supination/pronation of the forearm, heel taps and toe taps were normal bilaterally. Motor:  Strength is 5/5 in the bilateral upper and lower extremities.  Shoulder shrug is equal bilaterally.  There is no pronator drift.  There are no fasciculations noted. DTR's: Deep tendon reflexes are 2-/4 at the bilateral biceps, triceps, brachioradialis, patella and achilles.  Plantar responses are downgoing bilaterally. Gait and Station: The patient is able to ambulate without difficulty. The patient is able to heel toe walk without any difficulty. The patient is able to ambulate in a tandem fashion. The patient is able to stand in the Romberg position.   MOVEMENT EXAM: Tremor:  There is tremor in the UE, noted most significantly with action.  The patient is  not able to draw Archimedes spirals without significant difficulty.  There is no tremor at rest.  The patient is not able to pour water from one glass to another without spilling it.     Assessment/Plan:   1.  Essential Tremor.  -This is evidenced by the symmetrical nature and longstanding hx of gradually getting worse.  She saw Dr. Erling Cruz years ago for the same, but it was more asymmetric then.  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  I do not think that it is a result of her neck surgeries or "pinched nerves" as she has been worried about.  Talked to her about treatments, both medical, surgical and OT devices to help.  She would like to try low dose primidone.  Risks, benefits, side effects and alternative therapies were discussed.  The opportunity to ask questions was given and they were answered to the best of my ability.  The patient expressed understanding and willingness to follow the outlined treatment protocols.  -will try to get a copy of labs, including TSH, from PCP  -Pt education/literature provided.  Greater than 50% of 60 min visit in counseling.   2.  Tob abuse  -discussed importance of tobacco cessation 3.  F/u 3 months, sooner should new neuro issues arise

## 2014-10-28 ENCOUNTER — Telehealth: Payer: Self-pay | Admitting: Neurology

## 2014-10-28 NOTE — Telephone Encounter (Signed)
Pt called requesting to speak to a nurse regarding her script for PRIMIDONE 50 mg C/b [phone]

## 2014-10-28 NOTE — Telephone Encounter (Signed)
Spoke with patient, she states that the half tablet of Primidone is controlling her tremor. She has not gone up to a full tablet. She states she is having some sleepiness, but no other symptoms. She wants to remain on the half tablet of Primidone at this time. I advised that this would be fine if her tremor is controlled and she will call with any problems.

## 2014-12-03 ENCOUNTER — Telehealth: Payer: Self-pay | Admitting: *Deleted

## 2014-12-03 NOTE — Telephone Encounter (Signed)
Spoke with patient she states that for a few weeks she has had diarrhea in the mornings that comes and goes. No other GI symptoms (no nausea, vomiting, etc). She had one nose bleed this morning that didn't last long. She has increased her Primidone to one tablet daily instead of half. Aware this medication would not cause these symptoms. She will make a follow up with her PCP to evaluate ongoing diarrhea and call us with any other problems. She states Primidone is making tremor much better.

## 2014-12-03 NOTE — Telephone Encounter (Signed)
Patient was in a few months ago she states her new  medication is giving her some funny side affects nose bleeds and upset stomach Call back number [phone]

## 2015-01-24 ENCOUNTER — Other Ambulatory Visit: Payer: Self-pay | Admitting: Neurology

## 2015-01-24 MED ORDER — PRIMIDONE 50 MG PO TABS: 50.0000 mg | ORAL_TABLET | Freq: Every day | ORAL | Status: DC

## 2015-01-24 NOTE — Telephone Encounter (Signed)
Primidone refill requested. Per last office note- patient to remain on medication. Refill approved and sent to patient's pharmacy.   

## 2015-02-05 ENCOUNTER — Other Ambulatory Visit (INDEPENDENT_AMBULATORY_CARE_PROVIDER_SITE_OTHER): Payer: Medicare Other

## 2015-02-05 ENCOUNTER — Encounter: Payer: Self-pay | Admitting: Neurology

## 2015-02-05 ENCOUNTER — Ambulatory Visit (INDEPENDENT_AMBULATORY_CARE_PROVIDER_SITE_OTHER): Payer: Medicare Other | Admitting: Neurology

## 2015-02-05 VITALS — BP 120/70 | HR 70 | Ht 65.0 in | Wt 217.7 lb

## 2015-02-05 DIAGNOSIS — R251 Tremor, unspecified: Secondary | ICD-10-CM

## 2015-02-05 DIAGNOSIS — G250 Essential tremor: Secondary | ICD-10-CM

## 2015-02-05 LAB — TSH: TSH: 1.65 u[IU]/mL (ref 0.35–4.50)

## 2015-02-05 NOTE — Progress Notes (Signed)
Note sent

## 2015-02-05 NOTE — Progress Notes (Signed)
Subjective:   [name] was seen in consultation in the movement disorder clinic at the request of Dr. Vertell Limber.  Her PCP is Osborne Casco, MD.  The evaluation is for tremor.    The patient is a 64 y.o. right handed female with a history of tremor.  Tremor is in "arms and hands."  She states that she has had L arm tremor for 5-10 years; she notices it when using the hand.  She states that she was thinking that it was due to a pinched nerve.  She saw Dr. Erling Cruz for it and was told that it was essential tremor.  She states that she had a cervical fusion 3 years ago and when "I woke up I couldn't move the right arm."  She states that tremor in the right arm started after that.  She was tried on lyrica but states that it made tremor on the L worse.  She states that gabapentin had a similar result.  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  There is no family hx of tremor.    Affected by caffeine: unknown, as doesn't drink much Affected by alcohol:  Unknown as doesn't drink Affected by stress:  No. Affected by fatigue:  No. Spills soup if on spoon:  Yes.   Spills glass of liquid if full:  Yes.   Affects ADL's (tying shoes, brushing teeth, etc): some (notices it when pulls up pants)  02/05/15 update:  The patient returns today for follow-up.  I started her on primidone last visit.  The patient reports this is definitely helping her tremor.  She denies any side effects with the medication.  I got labs from her PCP since last visit.  They were from 06/2014 but states that she had labs few weeks ago.  She had normal HgbA1C of 5.8.  Her chemistry was normal as was ast/alt.  CBC was normal.  Outside reports reviewed: historical medical records.  No Known Allergies  Outpatient Encounter Prescriptions as of 02/05/2015  Medication Sig  . acetaminophen (TYLENOL) 500 MG  tablet Take 1,000 mg by mouth every 6 (six) hours as needed (pain).  . Ascorbic Acid (VITAMIN C PO) Take 1 tablet by mouth 3 (three) times a week.   . ferrous sulfate 325 (65 FE) MG tablet Take 325 mg by mouth 3 (three) times a week.  Marland Kitchen GLUCOSAMINE PO Take 1 tablet by mouth 3 (three) times a week.  Marland Kitchen HYDROcodone-acetaminophen (NORCO) 10-325 MG per tablet Take 1 tablet by mouth every 6 (six) hours as needed (pain).  . Multiple Vitamins-Minerals (MULTIVITAMIN WITH MINERALS) tablet Take 1 tablet by mouth 3 (three) times a week.   Loma [place] Calcium ([employer] OYSTER SHELL CALCIUM) 500 MG TABS Take 1 tablet by mouth 3 (three) times a week.  . primidone (MYSOLINE) 50 MG tablet Take 1 tablet (50 mg total) by mouth at bedtime.  . Probiotic Product (PROBIOTIC DAILY PO) Take 1 tablet by mouth every other day.  . simvastatin (ZOCOR) 40 MG tablet Take 40 mg by mouth every evening.  . valsartan-hydrochlorothiazide (DIOVAN-HCT) 80-12.5 MG per tablet Take 0.5 tablets by mouth every morning.    No facility-administered encounter medications on file as of 02/05/2015.    Past Medical History  Diagnosis Date  . Diverticulitis   . Cervical spinal stenosis   .  Hypertension   . Hypercholesteremia   . Arthritis   . Complication of anesthesia     "makes me cry for days."  . Family history of anesthesia complication     Sister has nausea and vomitting.    Past Surgical History  Procedure Laterality Date  . Thoracic fusion  1992  . Tumor removal      small intestine  . Anterior cervical decomp/discectomy fusion  12/30/11  . Abdominal hysterectomy  1995  . Colectomy  2004    "lost 1/3 of my colon"  . Back surgery  1992     History   Social History  . Marital Status: Divorced    Spouse Name: N/A  . Number of Children: N/A  . Years of Education: N/A   Occupational History  . retired     Press photographer, Teaching laboratory technician   Social History Main Topics  . Smoking status: Current Every Day Smoker -- 0.50 packs/day for  40 years    Types: Cigarettes  . Smokeless tobacco: Never Used  . Alcohol Use: No  . Drug Use: No  . Sexual Activity: No   Other Topics Concern  . Not on file   Social History Narrative    Family Status  Relation Status Death Age  . Mother Deceased     healthy   . Father Deceased     healthy  . Sister Alive     healthy  . Son Alive     healthy    Review of Systems A complete 10 system ROS was obtained and was negative apart from what is mentioned.   Objective:   VITALS:   Filed Vitals:   02/05/15 0920  BP: 120/70  Pulse: 70  Height: 5\' 5"  (1.651 m)  Weight: 217 lb 11.2 oz (98.748 kg)  SpO2: 97%   Gen:  Appears stated age and in NAD. HEENT:  Normocephalic, atraumatic. The mucous membranes are moist. The superficial temporal arteries are without ropiness or tenderness. Cardiovascular: Regular rate and rhythm. Lungs: Clear to auscultation bilaterally. Neck: There are no carotid bruits noted bilaterally.  NEUROLOGICAL:  Orientation:  The patient is alert and oriented x 3.  Recent and remote memory are intact.  Attention span and concentration are normal.  Able to name objects and repeat without trouble.  Fund of knowledge is appropriate Cranial nerves: There is good facial symmetry. The pupils are equal round and reactive to light bilaterally. Fundoscopic exam reveals clear disc margins bilaterally. Extraocular muscles are intact and visual fields are full to confrontational testing. Speech has intermittent stuttering quality. Soft palate rises symmetrically and there is no tongue deviation. Hearing is intact to conversational tone. Tone: Tone is good throughout. Sensation: Sensation is intact to light touch throughout Coordination:  The patient has no dysdiadichokinesia or dysmetria.  However, there are some non-physiologic aspects to hand opening/closing and to finger taps bilaterally.  Alternation supination/pronation of the forearm, heel taps and toe taps were  normal bilaterally. Motor: Strength is 5/5 in the bilateral upper and lower extremities.  Shoulder shrug is equal bilaterally.  There is no pronator drift.  There are no fasciculations noted. Gait and Station: The patient is able to ambulate without difficulty. Sways in romberg position with eyes closed but able to maintain position.  MOVEMENT EXAM: Tremor:  There is tremor in the UE, noted most significantly with action.  Archimedes spirals are better than last visit.   There is no tremor at rest.  The patient is not able to  pour water from one glass to another without spilling it.     Assessment/Plan:   1.  Essential Tremor.  -This is evidenced by the symmetrical nature and longstanding hx of gradually getting worse.  She saw Dr. Erling Cruz years ago for the same, but it was more asymmetric then.  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  I do not think that it is a result of her neck surgeries or "pinched nerves" as she has been worried about.  Doing better on 50 mg primidone and talked to her about increasing it but she doesn't want to so will continue that for now.  Risks, benefits, side effects and alternative therapies were discussed.  The opportunity to ask questions was given and they were answered to the best of my ability.  The patient expressed understanding and willingness to follow the outlined treatment protocols.  -her last tsh was done in 2014 so will order that to make sure not contributing source of tremor  -Pt education/literature provided.  Greater than 50% of 60 min visit in counseling.   2.  Tob abuse  -discussed importance of tobacco cessation 3.  F/u 6 months, sooner should new neuro issues arise

## 2015-02-05 NOTE — Patient Instructions (Signed)
Return in 6 months

## 2015-04-22 ENCOUNTER — Emergency Department (HOSPITAL_COMMUNITY)
Admission: EM | Admit: 2015-04-22 | Discharge: 2015-04-22 | Disposition: A | Discharge status: Home / Self Care | Payer: Medicare Other | Attending: Emergency Medicine | Admitting: Emergency Medicine

## 2015-04-22 ENCOUNTER — Emergency Department (HOSPITAL_COMMUNITY): Payer: Medicare Other

## 2015-04-22 ENCOUNTER — Encounter (HOSPITAL_COMMUNITY): Payer: Self-pay | Admitting: Emergency Medicine

## 2015-04-22 DIAGNOSIS — E780 Pure hypercholesterolemia: Secondary | ICD-10-CM | POA: Insufficient documentation

## 2015-04-22 DIAGNOSIS — J209 Acute bronchitis, unspecified: Secondary | ICD-10-CM | POA: Insufficient documentation

## 2015-04-22 DIAGNOSIS — Z8719 Personal history of other diseases of the digestive system: Secondary | ICD-10-CM | POA: Diagnosis not present

## 2015-04-22 DIAGNOSIS — M1990 Unspecified osteoarthritis, unspecified site: Secondary | ICD-10-CM | POA: Insufficient documentation

## 2015-04-22 DIAGNOSIS — I10 Essential (primary) hypertension: Secondary | ICD-10-CM | POA: Insufficient documentation

## 2015-04-22 DIAGNOSIS — Z792 Long term (current) use of antibiotics: Secondary | ICD-10-CM | POA: Diagnosis not present

## 2015-04-22 DIAGNOSIS — Z79899 Other long term (current) drug therapy: Secondary | ICD-10-CM | POA: Insufficient documentation

## 2015-04-22 DIAGNOSIS — Z720 Tobacco use: Secondary | ICD-10-CM | POA: Insufficient documentation

## 2015-04-22 DIAGNOSIS — J0100 Acute maxillary sinusitis, unspecified: Secondary | ICD-10-CM | POA: Diagnosis not present

## 2015-04-22 DIAGNOSIS — R51 Headache: Secondary | ICD-10-CM

## 2015-04-22 DIAGNOSIS — R519 Headache, unspecified: Secondary | ICD-10-CM

## 2015-04-22 DIAGNOSIS — R0602 Shortness of breath: Secondary | ICD-10-CM | POA: Diagnosis present

## 2015-04-22 LAB — BASIC METABOLIC PANEL
ANION GAP: 11 (ref 5–15)
BUN: 10 mg/dL (ref 6–20)
CALCIUM: 8.8 mg/dL — AB (ref 8.9–10.3)
CO2: 24 mmol/L (ref 22–32)
Chloride: 98 mmol/L — ABNORMAL LOW (ref 101–111)
Creatinine, Ser: 0.95 mg/dL (ref 0.44–1.00)
GFR calc Af Amer: >60 mL/min (ref >60)
GLUCOSE: 143 mg/dL — AB (ref 65–99)
Potassium: 3.4 mmol/L — ABNORMAL LOW (ref 3.5–5.1)
Sodium: 133 mmol/L — ABNORMAL LOW (ref 135–145)

## 2015-04-22 LAB — CBC
HEMATOCRIT: 44.8 % (ref 36.0–46.0)
Hemoglobin: 15.5 g/dL — ABNORMAL HIGH (ref 12.0–15.0)
MCH: 32.9 pg (ref 26.0–34.0)
MCHC: 34.6 g/dL (ref 30.0–36.0)
MCV: 95.1 fL (ref 78.0–100.0)
PLATELETS: 214 10*3/uL (ref 150–400)
RBC: 4.71 MIL/uL (ref 3.87–5.11)
RDW: 13.7 % (ref 11.5–15.5)
WBC: 9.4 10*3/uL (ref 4.0–10.5)

## 2015-04-22 MED ORDER — ALBUTEROL SULFATE HFA 108 (90 BASE) MCG/ACT IN AERS
2.0000 | INHALATION_SPRAY | RESPIRATORY_TRACT | Status: DC | PRN
Start: 2015-04-22 — End: 2015-04-22
  Administered 2015-04-22: 2 via RESPIRATORY_TRACT
  Filled 2015-04-22: qty 6.7

## 2015-04-22 MED ORDER — ALBUTEROL (5 MG/ML) CONTINUOUS INHALATION SOLN
10.0000 mg/h | INHALATION_SOLUTION | Freq: Once | RESPIRATORY_TRACT | Status: AC
  Administered 2015-04-22: 10 mg/h via RESPIRATORY_TRACT
  Filled 2015-04-22: qty 20

## 2015-04-22 MED ORDER — IPRATROPIUM-ALBUTEROL 0.5-2.5 (3) MG/3ML IN SOLN
3.0000 mL | Freq: Once | RESPIRATORY_TRACT | Status: AC
  Administered 2015-04-22: 3 mL via RESPIRATORY_TRACT
  Filled 2015-04-22: qty 3

## 2015-04-22 MED ORDER — IPRATROPIUM BROMIDE 0.02 % IN SOLN
0.5000 mg | Freq: Once | RESPIRATORY_TRACT | Status: AC
  Administered 2015-04-22: 0.5 mg via RESPIRATORY_TRACT
  Filled 2015-04-22: qty 2.5

## 2015-04-22 MED ORDER — SODIUM CHLORIDE 0.9 % IV SOLN
INTRAVENOUS | Status: DC
  Administered 2015-04-22: 125 mL/h via INTRAVENOUS

## 2015-04-22 MED ORDER — SODIUM CHLORIDE 0.9 % IV BOLUS (SEPSIS)
500.0000 mL | Freq: Once | INTRAVENOUS | Status: AC
  Administered 2015-04-22: 500 mL via INTRAVENOUS

## 2015-04-22 MED ORDER — DEXTROSE 5 % IV SOLN
1.0000 g | Freq: Once | INTRAVENOUS | Status: AC
  Administered 2015-04-22: 1 g via INTRAVENOUS
  Filled 2015-04-22: qty 10

## 2015-04-22 MED ORDER — AEROCHAMBER Z-STAT PLUS/MEDIUM MISC
1.0000 | Freq: Once | Status: AC
Start: 2015-04-22 — End: 2015-04-22
  Administered 2015-04-22: 1

## 2015-04-22 MED ORDER — PREDNISONE 20 MG PO TABS
60.0000 mg | ORAL_TABLET | Freq: Once | ORAL | Status: AC
  Administered 2015-04-22: 60 mg via ORAL
  Filled 2015-04-22: qty 3

## 2015-04-22 MED ORDER — ALBUTEROL SULFATE (2.5 MG/3ML) 0.083% IN NEBU
5.0000 mg | INHALATION_SOLUTION | Freq: Once | RESPIRATORY_TRACT | Status: AC
  Administered 2015-04-22: 5 mg via RESPIRATORY_TRACT
  Filled 2015-04-22: qty 6

## 2015-04-22 MED ORDER — PREDNISONE 10 MG PO TABS: ORAL_TABLET | ORAL | Status: DC

## 2015-04-22 MED ORDER — OXYMETAZOLINE HCL 0.05 % NA SOLN
2.0000 | Freq: Once | NASAL | Status: AC
  Administered 2015-04-22: 2 via NASAL
  Filled 2015-04-22: qty 15

## 2015-04-22 NOTE — ED Notes (Signed)
Made respiratory aware of neb treatment and peak flow order.

## 2015-04-22 NOTE — ED Provider Notes (Signed)
CSN: [id number]     Arrival date & time 04/22/15  0755 History   First MD Initiated Contact with Patient 04/22/15 0802     Chief Complaint  Patient presents with  . Shortness of Breath     (Consider location/radiation/quality/duration/timing/severity/associated sxs/prior Treatment) HPI   [name] is a 64 y.o. female presents for evaluation of sore throat, cough which is productive of sputum, face pain, nasal congestion and sore throat. She was seen several days ago, diagnosed with "strep throat", by clinical exam, and started on Augmentin. She states she is no better and feels it is related to her inability to absorb oral antibiotics because of prior small intestine surgery. She feels hot but has not taken her temperature. She has decreased oral intake. She denies vomiting, diarrhea, chest pain, abdominal pain or back pain. There are no other known modifying factors.  Past Medical History  Diagnosis Date  . Diverticulitis   . Cervical spinal stenosis   . Hypertension   . Hypercholesteremia   . Arthritis   . Complication of anesthesia     "makes me cry for days."  . Family history of anesthesia complication     Sister has nausea and vomitting.   Past Surgical History  Procedure Laterality Date  . Thoracic fusion  1992  . Tumor removal      small intestine  . Anterior cervical decomp/discectomy fusion  12/30/11  . Abdominal hysterectomy  1995  . Colectomy  2004    "lost 1/3 of my colon"  . Back surgery  1992    Family History  Problem Relation Age of Onset  . Anesthesia problems Sister    Social History  Substance Use Topics  . Smoking status: Current Every Day Smoker -- 0.50 packs/day for 40 years    Types: Cigarettes  . Smokeless tobacco: Never Used  . Alcohol Use: No   OB History    No data available     Review of Systems  All other systems reviewed and are negative.     Allergies  Review of patient's allergies indicates no known allergies.  Home  Medications   Prior to Admission medications   Medication Sig Start Date End Date Taking? Authorizing Provider  acetaminophen (TYLENOL) 500 MG tablet Take 1,000 mg by mouth every 6 (six) hours as needed (pain).   Yes Historical Provider, MD  ALPRAZolam Duanne Moron) 0.5 MG tablet Take 1 tablet by mouth every 6 (six) hours as needed. anxiety 02/20/15  Yes Historical Provider, MD  amoxicillin-clavulanate (AUGMENTIN) 875-125 MG per tablet Take 1 tablet by mouth 2 (two) times daily. for 10 days.  04/20/15-11/27/14 04/20/15  Yes Historical Provider, MD  Ascorbic Acid (VITAMIN C PO) Take 1 tablet by mouth 3 (three) times a week.    Yes Historical Provider, MD  ferrous sulfate 325 (65 FE) MG tablet Take 325 mg by mouth 3 (three) times a week.   Yes Historical Provider, MD  GLUCOSAMINE PO Take 1 tablet by mouth 3 (three) times a week.   Yes Historical Provider, MD  hydrochlorothiazide (HYDRODIURIL) 12.5 MG tablet Take 1 tablet by mouth daily. 04/16/15  Yes Historical Provider, MD  HYDROcodone-acetaminophen (NORCO/VICODIN) 5-325 MG per tablet Take 0.5-1 tablets by mouth every 8 (eight) hours as needed. pain 04/14/15  Yes Historical Provider, MD  Multiple Vitamins-Minerals (MULTIVITAMIN WITH MINERALS) tablet Take 1 tablet by mouth 3 (three) times a week.    Yes Historical Provider, MD  Loma [place] Calcium ([employer] OYSTER SHELL CALCIUM) 500  MG TABS Take 1 tablet by mouth 3 (three) times a week.   Yes Historical Provider, MD  primidone (MYSOLINE) 50 MG tablet Take 1 tablet (50 mg total) by mouth at bedtime. 01/24/15  Yes Rebecca S Tat, DO  Probiotic Product (PROBIOTIC DAILY PO) Take 1 tablet by mouth every other day.   Yes Historical Provider, MD  simvastatin (ZOCOR) 40 MG tablet Take 40 mg by mouth every evening.   Yes Historical Provider, MD  valsartan (DIOVAN) 40 MG tablet Take 0.5 tablets by mouth daily. 04/16/15  Yes Historical Provider, MD  valsartan-hydrochlorothiazide (DIOVAN-HCT) 80-12.5 MG per tablet Take 0.5 tablets  by mouth every morning.    Yes Historical Provider, MD  Vitamin D, Ergocalciferol, (DRISDOL) 50000 UNITS CAPS capsule Take 1 capsule by mouth once a week. 03/27/15  Yes Historical Provider, MD  predniSONE (DELTASONE) 10 MG tablet Take q day 6,5,4,3,2,1 04/22/15   Daleen Bo, MD   BP 130/70 mmHg  Pulse 82  Temp(Src) 97.6 F (36.4 C) (Oral)  Resp 16  SpO2 95% Physical Exam  Constitutional: She is oriented to person, place, and time. She appears well-developed. She appears distressed (she is uncomfortable.).  Elderly, appears older than stated age.  HENT:  Head: Normocephalic and atraumatic.  Right Ear: External ear normal.  Left Ear: External ear normal.  Moderate posterior pharynx erythema, without deviation or exudate. Clear nasal drainage. Mild maxillary sinus tenderness with percussion.  Eyes: Conjunctivae and EOM are normal. Pupils are equal, round, and reactive to light.  Neck: Normal range of motion and phonation normal. Neck supple.  Cardiovascular: Normal rate, regular rhythm and normal heart sounds.   Pulmonary/Chest: Effort normal and breath sounds normal. She exhibits no bony tenderness.  Abdominal: Soft. There is no tenderness.  Musculoskeletal: Normal range of motion.  Neurological: She is alert and oriented to person, place, and time. No cranial nerve deficit or sensory deficit. She exhibits normal muscle tone. Coordination normal.  Skin: Skin is warm, dry and intact.  Psychiatric: She has a normal mood and affect. Her behavior is normal. Judgment and thought content normal.  Nursing note and vitals reviewed.   ED Course  Procedures (including critical care time)  Medications  0.9 %  sodium chloride infusion (125 mL/hr Intravenous New Bag/Given 04/22/15 1031)  albuterol (PROVENTIL HFA;VENTOLIN HFA) 108 (90 BASE) MCG/ACT inhaler 2 puff (not administered)  aerochamber Z-Stat Plus/medium 1 each (not administered)  ipratropium-albuterol (DUONEB) 0.5-2.5 (3) MG/3ML  nebulizer solution 3 mL (3 mLs Nebulization Given 04/22/15 0830)  sodium chloride 0.9 % bolus 500 mL (500 mLs Intravenous New Bag/Given 04/22/15 1010)  cefTRIAXone (ROCEPHIN) 1 g in dextrose 5 % 50 mL IVPB (0 g Intravenous Stopped 04/22/15 1103)  albuterol (PROVENTIL) (2.5 MG/3ML) 0.083% nebulizer solution 5 mg (5 mg Nebulization Given 04/22/15 0915)  ipratropium (ATROVENT) nebulizer solution 0.5 mg (0.5 mg Nebulization Given 04/22/15 0915)  oxymetazoline (AFRIN) 0.05 % nasal spray 2 spray (2 sprays Each Nare Given 04/22/15 0937)  albuterol (PROVENTIL,VENTOLIN) solution continuous neb (10 mg/hr Nebulization Given 04/22/15 1336)  predniSONE (DELTASONE) tablet 60 mg (60 mg Oral Given 04/22/15 1319)    Patient Vitals for the past 24 hrs:  BP Temp Temp src Pulse Resp SpO2  04/22/15 1351 130/70 mmHg 97.6 F (36.4 C) Oral 82 16 95 %  04/22/15 1338 - - - - - 98 %  04/22/15 1049 142/74 mmHg 99.1 F (37.3 C) Oral 87 16 95 %  04/22/15 0915 - - - - - 94 %  04/22/15 0837 121/65 mmHg 98.7 F (37.1 C) Oral 72 10 97 %  04/22/15 0830 - - - - - 96 %    3:43 PM Reevaluation with update and discussion. After initial assessment and treatment, an updated evaluation reveals no respiratory distress. Vital signs are reassuring. Donnald Tabar L   CRITICAL CARE Performed by: Daleen Bo L Total critical care time: 35 minutes Critical care time was exclusive of separately billable procedures and treating other patients. Critical care was necessary to treat or prevent imminent or life-threatening deterioration. Critical care was time spent personally by me on the following activities: development of treatment plan with patient and/or surrogate as well as nursing, discussions with consultants, evaluation of patient's response to treatment, examination of patient, obtaining history from patient or surrogate, ordering and performing treatments and interventions, ordering and review of laboratory studies, ordering and review of  radiographic studies, pulse oximetry and re-evaluation of patient's condition.   Labs Review Labs Reviewed  CBC - Abnormal; Notable for the following:    Hemoglobin 15.5 (*)    All other components within normal limits  BASIC METABOLIC PANEL - Abnormal; Notable for the following:    Sodium 133 (*)    Potassium 3.4 (*)    Chloride 98 (*)    Glucose, Bld 143 (*)    Calcium 8.8 (*)    All other components within normal limits    Imaging Review Dg Chest 2 View  04/22/2015   CLINICAL DATA:  Cough and chest congestion. Shortness of breath. Sore throat. Mid chest pain.  EXAM: CHEST  2 VIEW  COMPARISON:  03/21/2012 and 12/27/2011  FINDINGS: The heart size and mediastinal contours are within normal limits. Both lungs are clear. Arthritic changes of both shoulders.  IMPRESSION: No acute abnormalities.   Electronically Signed   By: Lorriane Shire M.D.   On: 04/22/2015 08:21   [facility]  04/22/2015   CLINICAL DATA:  Facial pain.  Productive cough.  Nasal drip.  EXAM: CT PARANASAL SINUS LIMITED WITHOUT CONTRAST  TECHNIQUE: Non-contiguous multidetector CT images of the paranasal sinuses were obtained in a single plane without contrast.  COMPARISON:  None.  FINDINGS: There is a small right maxillary sinus air-fluid level. There is bilateral maxillary sinus mucosal thickening. There is bilateral ethmoid sinus mucosal thickening. There is mucosal thickening in the right frontoethmoidal recess. There is mild rightward deviation of the nasal septum. There is no bone destruction. No bony abnormalities are identified. There is a small right mastoid effusion. The mastoid sinuses are otherwise clear. The visualized portions of the brain and orbits are unremarkable. There is bilateral facet arthropathy at C2-3. The visualized temporomandibular joints are normal.  IMPRESSION: Sinus disease as described above.   Electronically Signed   By: Kathreen Devoid   On: 04/22/2015 10:30   I have personally reviewed  and evaluated these images and lab results as part of my medical decision-making.   EKG Interpretation None      MDM   Final diagnoses:  Face pain  Acute bronchitis, unspecified organism  Acute maxillary sinusitis, recurrence not specified  Tobacco abuse    Sinusitis with secondary bronchitis, acute. No transfer serous material, infection, metabolic instability or suggestion of impending vascular collapse.   Nursing Notes Reviewed/ Care Coordinated Applicable Imaging Reviewed Interpretation of Laboratory Data incorporated into ED treatment  The patient appears reasonably screened and/or stabilized for discharge and I doubt any other medical condition or other [facility] requiring further screening, evaluation, or treatment  in the ED at this time prior to discharge.  Plan: Home Medications- Prednisone, Albuterol, Afrin; Home Treatments- Stop smoking; return here if the recommended treatment, does not improve the symptoms; Recommended follow up- PCP for check up in 2 days    Daleen Bo, MD 04/22/15 1546

## 2015-04-22 NOTE — Discharge Instructions (Signed)
Acute Bronchitis Bronchitis is inflammation of the airways that extend from the windpipe into the lungs (bronchi). The inflammation often causes mucus to develop. This leads to a cough, which is the most common symptom of bronchitis.  In acute bronchitis, the condition usually develops suddenly and goes away over time, usually in a couple weeks. Smoking, allergies, and asthma can make bronchitis worse. Repeated episodes of bronchitis may cause further lung problems.  CAUSES Acute bronchitis is most often caused by the same virus that causes a cold. The virus can spread from person to person (contagious) through coughing, sneezing, and touching contaminated objects. SIGNS AND SYMPTOMS   Cough.   Fever.   Coughing up mucus.   Body aches.   Chest congestion.   Chills.   Shortness of breath.   Sore throat.  DIAGNOSIS  Acute bronchitis is usually diagnosed through a physical exam. Your health care provider will also ask you questions about your medical history. Tests, such as chest X-rays, are sometimes done to rule out other conditions.  TREATMENT  Acute bronchitis usually goes away in a couple weeks. Oftentimes, no medical treatment is necessary. Medicines are sometimes given for relief of fever or cough. Antibiotic medicines are usually not needed but may be prescribed in certain situations. In some cases, an inhaler may be recommended to help reduce shortness of breath and control the cough. A cool mist vaporizer may also be used to help thin bronchial secretions and make it easier to clear the chest.  HOME CARE INSTRUCTIONS  Get plenty of rest.   Drink enough fluids to keep your urine clear or pale yellow (unless you have a medical condition that requires fluid restriction). Increasing fluids may help thin your respiratory secretions (sputum) and reduce chest congestion, and it will prevent dehydration.   Take medicines only as directed by your health care provider.  If  you were prescribed an antibiotic medicine, finish it all even if you start to feel better.  Avoid smoking and secondhand smoke. Exposure to cigarette smoke or irritating chemicals will make bronchitis worse. If you are a smoker, consider using nicotine gum or skin patches to help control withdrawal symptoms. Quitting smoking will help your lungs heal faster.   Reduce the chances of another bout of acute bronchitis by washing your hands frequently, avoiding people with cold symptoms, and trying not to touch your hands to your mouth, nose, or eyes.   Keep all follow-up visits as directed by your health care provider.  SEEK MEDICAL CARE IF: Your symptoms do not improve after 1 week of treatment.  SEEK IMMEDIATE MEDICAL CARE IF:  You develop an increased fever or chills.   You have chest pain.   You have severe shortness of breath.  You have bloody sputum.   You develop dehydration.  You faint or repeatedly feel like you are going to pass out.  You develop repeated vomiting.  You develop a severe headache. MAKE SURE YOU:   Understand these instructions.  Will watch your condition.  Will get help right away if you are not doing well or get worse. Document Released: 09/09/2004 Document Revised: 12/17/2013 Document Reviewed: 01/23/2013 [facility] Patient Information 2015 [place], [place]. This information is not intended to replace advice given to you by your health care provider. Make sure you discuss any questions you have with your health care provider.  Sinus Headache A sinus headache is when your sinuses become clogged or swollen. Sinus headaches can range from mild to severe.  CAUSES  A sinus headache can have different causes, such as:  Colds.  Sinus infections.  Allergies. SYMPTOMS  Symptoms of a sinus headache may vary and can include:  Headache.  Pain or pressure in the face.  Congested or runny nose.  Fever.  Inability to smell.  Pain in upper  teeth. Weather changes can make symptoms worse. TREATMENT  The treatment of a sinus headache depends on the cause.  Sinus pain caused by a sinus infection may be treated with antibiotic medicine.  Sinus pain caused by allergies may be helped by allergy medicines (antihistamines) and medicated nasal sprays.  Sinus pain caused by congestion may be helped by flushing the nose and sinuses with saline solution. HOME CARE INSTRUCTIONS   If antibiotics are prescribed, take them as directed. Finish them even if you start to feel better.  Only take over-the-counter or prescription medicines for pain, discomfort, or fever as directed by your caregiver.  If you have congestion, use a nasal spray to help reduce pressure. SEEK IMMEDIATE MEDICAL CARE IF:  You have a fever.  You have headaches more than once a week.  You have sensitivity to light or sound.  You have repeated nausea and vomiting.  You have vision problems.  You have sudden, severe pain in your face or head.  You have a seizure.  You are confused.  Your sinus headaches do not get better after treatment. Many people think they have a sinus headache when they actually have migraines or tension headaches. MAKE SURE YOU:   Understand these instructions.  Will watch your condition.  Will get help right away if you are not doing well or get worse. Document Released: 09/09/2004 Document Revised: 10/25/2011 Document Reviewed: 10/31/2010 [facility] Patient Information 2015 [place], [place]. This information is not intended to replace advice given to you by your health care provider. Make sure you discuss any questions you have with your health care provider.  Smoking Cessation Quitting smoking is important to your health and has many advantages. However, it is not always easy to quit since nicotine is a very addictive drug. Oftentimes, people try 3 times or more before being able to quit. This document explains the best ways for  you to prepare to quit smoking. Quitting takes hard work and a lot of effort, but you can do it. ADVANTAGES OF QUITTING SMOKING  You will live longer, feel better, and live better.  Your body will feel the impact of quitting smoking almost immediately.  Within 20 minutes, blood pressure decreases. Your pulse returns to its normal level.  After 8 hours, carbon monoxide levels in the blood return to normal. Your oxygen level increases.  After 24 hours, the chance of having a heart attack starts to decrease. Your breath, hair, and body stop smelling like smoke.  After 48 hours, damaged nerve endings begin to recover. Your sense of taste and smell improve.  After 72 hours, the body is virtually free of nicotine. Your bronchial tubes relax and breathing becomes easier.  After 2 to 12 weeks, lungs can hold more air. Exercise becomes easier and circulation improves.  The risk of having a heart attack, stroke, cancer, or lung disease is greatly reduced.  After 1 year, the risk of coronary heart disease is cut in half.  After 5 years, the risk of stroke falls to the same as a nonsmoker.  After 10 years, the risk of lung cancer is cut in half and the risk of other cancers decreases significantly.  After  15 years, the risk of coronary heart disease drops, usually to the level of a nonsmoker.  If you are pregnant, quitting smoking will improve your chances of having a healthy baby.  The people you live with, especially any children, will be healthier.  You will have extra money to spend on things other than cigarettes. QUESTIONS TO THINK ABOUT BEFORE ATTEMPTING TO QUIT You may want to talk about your answers with your health care provider.  Why do you want to quit?  If you tried to quit in the past, what helped and what did not?  What will be the most difficult situations for you after you quit? How will you plan to handle them?  Who can help you through the tough times? Your family?  Friends? A health care provider?  What pleasures do you get from smoking? What ways can you still get pleasure if you quit? Here are some questions to ask your health care provider:  How can you help me to be successful at quitting?  What medicine do you think would be best for me and how should I take it?  What should I do if I need more help?  What is smoking withdrawal like? How can I get information on withdrawal? GET READY  Set a quit date.  Change your environment by getting rid of all cigarettes, ashtrays, matches, and lighters in your home, car, or work. Do not let people smoke in your home.  Review your past attempts to quit. Think about what worked and what did not. GET SUPPORT AND ENCOURAGEMENT You have a better chance of being successful if you have help. You can get support in many ways.  Tell your family, friends, and coworkers that you are going to quit and need their support. Ask them not to smoke around you.  Get individual, group, or telephone counseling and support. Programs are available at [employer] and health centers. Call your local health department for information about programs in your area.  Spiritual beliefs and practices may help some smokers quit.  Download a "quit meter" on your computer to keep track of quit statistics, such as how long you have gone without smoking, cigarettes not smoked, and money saved.  Get a self-help book about quitting smoking and staying off tobacco. [place] yourself from urges to smoke. Talk to someone, go for a walk, or occupy your time with a task.  Change your normal routine. Take a different route to work. Drink tea instead of coffee. Eat breakfast in a different place.  Reduce your stress. Take a hot bath, exercise, or read a book.  Plan something enjoyable to do every day. Reward yourself for not smoking.  Explore interactive web-based programs that specialize in helping you  quit. GET MEDICINE AND USE IT CORRECTLY Medicines can help you stop smoking and decrease the urge to smoke. Combining medicine with the above behavioral methods and support can greatly increase your chances of successfully quitting smoking.  Nicotine replacement therapy helps deliver nicotine to your body without the negative effects and risks of smoking. Nicotine replacement therapy includes nicotine gum, lozenges, inhalers, nasal sprays, and skin patches. Some may be available over-the-counter and others require a prescription.  Antidepressant medicine helps people abstain from smoking, but how this works is unknown. This medicine is available by prescription.  Nicotinic receptor partial agonist medicine simulates the effect of nicotine in your brain. This medicine is available by prescription. Ask your health  care provider for advice about which medicines to use and how to use them based on your health history. Your health care provider will tell you what side effects to look out for if you choose to be on a medicine or therapy. Carefully read the information on the package. Do not use any other product containing nicotine while using a nicotine replacement product.  RELAPSE OR DIFFICULT SITUATIONS Most relapses occur within the first 3 months after quitting. Do not be discouraged if you start smoking again. Remember, most people try several times before finally quitting. You may have symptoms of withdrawal because your body is used to nicotine. You may crave cigarettes, be irritable, feel very hungry, cough often, get headaches, or have difficulty concentrating. The withdrawal symptoms are only temporary. They are strongest when you first quit, but they will go away within 10-14 days. To reduce the chances of relapse, try to:  Avoid drinking alcohol. Drinking lowers your chances of successfully quitting.  Reduce the amount of caffeine you consume. Once you quit smoking, the amount of caffeine in  your body increases and can give you symptoms, such as a rapid heartbeat, sweating, and anxiety.  Avoid smokers because they can make you want to smoke.  Do not let weight gain distract you. Many smokers will gain weight when they quit, usually less than 10 pounds. Eat a healthy diet and stay active. You can always lose the weight gained after you quit.  Find ways to improve your mood other than smoking. FOR MORE INFORMATION  www.smokefree.gov  Document Released: 07/27/2001 Document Revised: 12/17/2013 Document Reviewed: 11/11/2011 [facility] Patient Information 2015 [place], [place]. This information is not intended to replace advice given to you by your health care provider. Make sure you discuss any questions you have with your health care provider.

## 2015-04-22 NOTE — ED Notes (Signed)
Per pt, states she was diagnosed with strep on Sunday at the [facility] walk in clinic-states increased SOB and difficulty breathing-states cold hs moved into chest

## 2015-04-22 NOTE — ED Notes (Signed)
Two IV attempts and both were unsuccessful.

## 2015-07-30 ENCOUNTER — Other Ambulatory Visit: Payer: Self-pay | Admitting: Neurology

## 2015-07-30 MED ORDER — PRIMIDONE 50 MG PO TABS: 50.0000 mg | ORAL_TABLET | Freq: Every day | ORAL | Status: DC

## 2015-07-30 NOTE — Telephone Encounter (Signed)
Primidone refill requested. Per last office note- patient to remain on medication. Refill approved and sent to patient's pharmacy.   

## 2015-08-03 ENCOUNTER — Emergency Department (HOSPITAL_COMMUNITY)
Admission: EM | Admit: 2015-08-03 | Discharge: 2015-08-03 | Disposition: A | Discharge status: Home / Self Care | Payer: Medicare Other | Attending: Emergency Medicine | Admitting: Emergency Medicine

## 2015-08-03 ENCOUNTER — Encounter (HOSPITAL_COMMUNITY): Payer: Self-pay | Admitting: Oncology

## 2015-08-03 DIAGNOSIS — E7800 Pure hypercholesterolemia, unspecified: Secondary | ICD-10-CM | POA: Insufficient documentation

## 2015-08-03 DIAGNOSIS — K5792 Diverticulitis of intestine, part unspecified, without perforation or abscess without bleeding: Secondary | ICD-10-CM | POA: Diagnosis not present

## 2015-08-03 DIAGNOSIS — F17210 Nicotine dependence, cigarettes, uncomplicated: Secondary | ICD-10-CM | POA: Diagnosis not present

## 2015-08-03 DIAGNOSIS — Z90710 Acquired absence of both cervix and uterus: Secondary | ICD-10-CM | POA: Insufficient documentation

## 2015-08-03 DIAGNOSIS — Z7952 Long term (current) use of systemic steroids: Secondary | ICD-10-CM | POA: Insufficient documentation

## 2015-08-03 DIAGNOSIS — Z9049 Acquired absence of other specified parts of digestive tract: Secondary | ICD-10-CM | POA: Diagnosis not present

## 2015-08-03 DIAGNOSIS — M1990 Unspecified osteoarthritis, unspecified site: Secondary | ICD-10-CM | POA: Insufficient documentation

## 2015-08-03 DIAGNOSIS — Z79899 Other long term (current) drug therapy: Secondary | ICD-10-CM | POA: Insufficient documentation

## 2015-08-03 DIAGNOSIS — R1032 Left lower quadrant pain: Secondary | ICD-10-CM | POA: Diagnosis present

## 2015-08-03 DIAGNOSIS — I10 Essential (primary) hypertension: Secondary | ICD-10-CM | POA: Insufficient documentation

## 2015-08-03 LAB — CBC WITH DIFFERENTIAL/PLATELET
BASOS PCT: 1 %
Basophils Absolute: 0.1 10*3/uL (ref 0.0–0.1)
Eosinophils Absolute: 0.1 10*3/uL (ref 0.0–0.7)
Eosinophils Relative: 1 %
HEMATOCRIT: 44 % (ref 36.0–46.0)
HEMOGLOBIN: 14.8 g/dL (ref 12.0–15.0)
LYMPHS ABS: 3.1 10*3/uL (ref 0.7–4.0)
LYMPHS PCT: 29 %
MCH: 31.9 pg (ref 26.0–34.0)
MCHC: 33.6 g/dL (ref 30.0–36.0)
MCV: 94.8 fL (ref 78.0–100.0)
MONO ABS: 0.8 10*3/uL (ref 0.1–1.0)
MONOS PCT: 7 %
NEUTROS ABS: 6.8 10*3/uL (ref 1.7–7.7)
NEUTROS PCT: 62 %
Platelets: 279 10*3/uL (ref 150–400)
RBC: 4.64 MIL/uL (ref 3.87–5.11)
RDW: 13.5 % (ref 11.5–15.5)
WBC: 10.7 10*3/uL — ABNORMAL HIGH (ref 4.0–10.5)

## 2015-08-03 LAB — COMPREHENSIVE METABOLIC PANEL
ALK PHOS: 47 U/L (ref 38–126)
ALT: 28 U/L (ref 14–54)
AST: 25 U/L (ref 15–41)
Albumin: 4.1 g/dL (ref 3.5–5.0)
Anion gap: 11 (ref 5–15)
BILIRUBIN TOTAL: 0.6 mg/dL (ref 0.3–1.2)
BUN: 12 mg/dL (ref 6–20)
CO2: 26 mmol/L (ref 22–32)
CREATININE: 0.87 mg/dL (ref 0.44–1.00)
Calcium: 9.2 mg/dL (ref 8.9–10.3)
Chloride: 102 mmol/L (ref 101–111)
GFR calc Af Amer: >60 mL/min (ref >60)
GLUCOSE: 123 mg/dL — AB (ref 65–99)
Potassium: 3.5 mmol/L (ref 3.5–5.1)
Sodium: 139 mmol/L (ref 135–145)
TOTAL PROTEIN: 8.2 g/dL — AB (ref 6.5–8.1)

## 2015-08-03 LAB — LIPASE, BLOOD: LIPASE: 19 U/L (ref 11–51)

## 2015-08-03 MED ORDER — ONDANSETRON HCL 4 MG/2ML IJ SOLN
4.0000 mg | Freq: Once | INTRAMUSCULAR | Status: AC
  Administered 2015-08-03: 4 mg via INTRAVENOUS
  Filled 2015-08-03: qty 2

## 2015-08-03 MED ORDER — CIPROFLOXACIN HCL 500 MG PO TABS: 500.0000 mg | ORAL_TABLET | Freq: Two times a day (BID) | ORAL | Status: DC

## 2015-08-03 MED ORDER — HYDROCODONE-ACETAMINOPHEN 5-325 MG PO TABS: 1.0000 | ORAL_TABLET | Freq: Four times a day (QID) | ORAL | Status: DC | PRN

## 2015-08-03 MED ORDER — METRONIDAZOLE 500 MG PO TABS: 500.0000 mg | ORAL_TABLET | Freq: Three times a day (TID) | ORAL | Status: DC

## 2015-08-03 NOTE — ED Notes (Signed)
Pt presents d/t abdominal pain, N/D and chills since Wednesday.  Pt has hx of diverticulitis and feels this feels similar.  Pt is tearful in triage d/t multiple surgeries in the past.

## 2015-08-03 NOTE — Discharge Instructions (Signed)
Cipro and Flagyl as prescribed.  Hydrocodone as prescribed as needed for pain.  Return to the emergency department if symptoms significantly worsen or change.   Diverticulitis Diverticulitis is inflammation or infection of small pouches in your colon that form when you have a condition called diverticulosis. The pouches in your colon are called diverticula. Your colon, or large intestine, is where water is absorbed and stool is formed. Complications of diverticulitis can include:  Bleeding.  Severe infection.  Severe pain.  Perforation of your colon.  Obstruction of your colon. CAUSES  Diverticulitis is caused by bacteria. Diverticulitis happens when stool becomes trapped in diverticula. This allows bacteria to grow in the diverticula, which can lead to inflammation and infection. RISK FACTORS People with diverticulosis are at risk for diverticulitis. Eating a diet that does not include enough fiber from fruits and vegetables may make diverticulitis more likely to develop. SYMPTOMS  Symptoms of diverticulitis may include:  Abdominal pain and tenderness. The pain is normally located on the left side of the abdomen, but may occur in other areas.  Fever and chills.  Bloating.  Cramping.  Nausea.  Vomiting.  Constipation.  Diarrhea.  Blood in your stool. DIAGNOSIS  Your health care provider will ask you about your medical history and do a physical exam. You may need to have tests done because many medical conditions can cause the same symptoms as diverticulitis. Tests may include:  Blood tests.  Urine tests.  Imaging tests of the abdomen, including X-rays and CT scans. When your condition is under control, your health care provider may recommend that you have a colonoscopy. A colonoscopy can show how severe your diverticula are and whether something else is causing your symptoms. TREATMENT  Most cases of diverticulitis are mild and can be treated at home. Treatment  may include:  Taking over-the-counter pain medicines.  Following a clear liquid diet.  Taking antibiotic medicines by mouth for 7-10 days. More severe cases may be treated at a hospital. Treatment may include:  Not eating or drinking.  Taking prescription pain medicine.  Receiving antibiotic medicines through an IV tube.  Receiving fluids and nutrition through an IV tube.  Surgery. HOME CARE INSTRUCTIONS   Follow your health care provider's instructions carefully.  Follow a full liquid diet or other diet as directed by your health care provider. After your symptoms improve, your health care provider may tell you to change your diet. He or she may recommend you eat a high-fiber diet. Fruits and vegetables are good sources of fiber. Fiber makes it easier to pass stool.  Take fiber supplements or probiotics as directed by your health care provider.  Only take medicines as directed by your health care provider.  Keep all your follow-up appointments. SEEK MEDICAL CARE IF:   Your pain does not improve.  You have a hard time eating food.  Your bowel movements do not return to normal. SEEK IMMEDIATE MEDICAL CARE IF:   Your pain becomes worse.  Your symptoms do not get better.  Your symptoms suddenly get worse.  You have a fever.  You have repeated vomiting.  You have bloody or black, tarry stools. MAKE SURE YOU:   Understand these instructions.  Will watch your condition.  Will get help right away if you are not doing well or get worse.   This information is not intended to replace advice given to you by your health care provider. Make sure you discuss any questions you have with your health care provider.  Document Released: 05/12/2005 Document Revised: 08/07/2013 Document Reviewed: 06/27/2013 Elsevier Interactive Patient Education [employer].

## 2015-08-03 NOTE — ED Notes (Signed)
Patient wants to wait until IV is started for bloodwork.

## 2015-08-03 NOTE — ED Provider Notes (Signed)
CSN: [id number]     Arrival date & time 08/03/15  [id number] History   First MD Initiated Contact with Patient 08/03/15 [phone]     Chief Complaint  Patient presents with  . 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 She presents for evaluation of lower abdominal cramping, loose stools, nausea, and chills for the past several days. She denies any urinary complaints. She denies any bloody stool.  She reports this episode is similar to prior flareups of diverticulitis.  Patient is a 64 y.o. female presenting with abdominal pain. The history is provided by the patient.  Abdominal Pain Pain location:  LLQ Pain quality: cramping   Pain radiates to:  Does not radiate Pain severity:  Moderate Onset quality:  Gradual Duration:  3 days Timing:  Constant Progression:  Worsening Chronicity:  New Relieved by:  Nothing Worsened by:  Movement and palpation Ineffective treatments:  None tried Associated symptoms: chills, diarrhea, fever and nausea     Past Medical History  Diagnosis Date  . Diverticulitis   . Cervical spinal stenosis   . Hypertension   . Hypercholesteremia   . Arthritis   . Complication of anesthesia     "makes me cry for days."  . Family history of anesthesia complication     Sister has nausea and vomitting.   Past Surgical History  Procedure Laterality Date  . Thoracic fusion  1992  . Tumor removal      small intestine  . Anterior cervical decomp/discectomy fusion  12/30/11  . Abdominal hysterectomy  1995  . Colectomy  2004    "lost 1/3 of my colon"  . Back surgery  1992    Family History  Problem Relation Age of Onset  . Anesthesia problems Sister    Social History  Substance Use Topics  . Smoking status: Current Every Day Smoker -- 0.50 packs/day for 40 years    Types: Cigarettes  . Smokeless tobacco: Never Used  . Alcohol Use: No   OB History    No data available     Review of Systems  Constitutional: Positive for fever and chills.  Gastrointestinal: Positive for nausea, abdominal pain and diarrhea.  All other systems reviewed and are negative.     Allergies  Review of patient's allergies indicates no known allergies.  Home Medications   Prior to Admission medications   Medication Sig Start Date End Date Taking? Authorizing Provider  acetaminophen (TYLENOL) 500 MG tablet Take 1,000 mg by mouth every 6 (six) hours as needed (pain).    Historical Provider, MD  ALPRAZolam Duanne Moron) 0.5 MG tablet Take 1 tablet by mouth every 6 (six) hours as needed. anxiety 02/20/15   Historical Provider, MD  amoxicillin-clavulanate (AUGMENTIN) 875-125 MG per tablet Take 1 tablet by mouth 2 (two) times daily. for 10 days.  04/20/15-11/27/14 04/20/15   Historical Provider, MD  Ascorbic Acid (VITAMIN C PO) Take 1 tablet by mouth 3 (three) times a week.     Historical Provider, MD  ferrous sulfate 325 (65 FE) MG tablet Take 325 mg by mouth 3 (three) times a week.    Historical Provider, MD  GLUCOSAMINE PO Take 1 tablet by mouth 3 (three) times a week.    Historical Provider, MD  hydrochlorothiazide (HYDRODIURIL) 12.5 MG tablet Take 1 tablet by mouth daily. 04/16/15   Historical Provider, MD  HYDROcodone-acetaminophen (NORCO/VICODIN) 5-325 MG per tablet Take 0.5-1 tablets by mouth every 8 (eight) hours as needed. pain 04/14/15   Historical Provider, MD  Multiple Vitamins-Minerals (MULTIVITAMIN WITH MINERALS) tablet Take 1 tablet by mouth 3 (three) times a week.     Historical Provider, MD  [place] Calcium ([employer] OYSTER SHELL CALCIUM) 500 MG TABS Take 1 tablet by mouth 3 (three) times a week.    Historical Provider, MD  predniSONE (DELTASONE) 10 MG tablet Take q day 6,5,4,3,2,1 04/22/15   Daleen Bo, MD  primidone (MYSOLINE) 50 MG tablet Take 1 tablet (50 mg total) by  mouth at bedtime. 07/30/15   Eustace Quail Tat, DO  Probiotic Product (PROBIOTIC DAILY PO) Take 1 tablet by mouth every other day.    Historical Provider, MD  simvastatin (ZOCOR) 40 MG tablet Take 40 mg by mouth every evening.    Historical Provider, MD  valsartan (DIOVAN) 40 MG tablet Take 0.5 tablets by mouth daily. 04/16/15   Historical Provider, MD  valsartan-hydrochlorothiazide (DIOVAN-HCT) 80-12.5 MG per tablet Take 0.5 tablets by mouth every morning.     Historical Provider, MD  Vitamin D, Ergocalciferol, (DRISDOL) 50000 UNITS CAPS capsule Take 1 capsule by mouth once a week. 03/27/15   Historical Provider, MD   BP 153/86 mmHg  Pulse 97  Temp(Src) 99.1 F (37.3 C) (Oral)  Resp 20  Ht 5\' 5"  (1.651 m)  Wt 220 lb (99.791 kg)  BMI 36.61 kg/m2  SpO2 97% Physical Exam  Constitutional: She is oriented to person, place, and time. She appears well-developed and well-nourished. No distress.  HENT:  Head: Normocephalic and atraumatic.  Neck: Normal range of motion. Neck supple.  Cardiovascular: Normal rate and regular rhythm.  Exam reveals no gallop and no friction rub.   No murmur heard. Pulmonary/Chest: Effort normal and breath sounds normal. No respiratory distress. She has no wheezes.  Abdominal: Soft. Bowel sounds are normal. She exhibits no distension. There is tenderness. There is no rebound and no guarding.  There is tenderness to palpation in the left lower quadrant.  Musculoskeletal: Normal range of motion.  Neurological: She is alert and oriented to person, place, and time.  Skin: Skin is warm and dry. She is not diaphoretic.  Nursing note and vitals reviewed.   ED Course  Procedures (including critical care time) Labs Review Labs Reviewed  CBC WITH DIFFERENTIAL/PLATELET  COMPREHENSIVE METABOLIC PANEL  LIPASE, BLOOD  URINALYSIS, ROUTINE W REFLEX MICROSCOPIC (NOT AT [facility])    Imaging Review No results found. I have personally reviewed and evaluated these images and lab  results as part of my medical decision-making.    MDM   Final diagnoses:  None    Patient presents here with complaints of lower abdominal pain that is consistent with prior episodes of diverticulitis. She has a mild white count of 10.7, however otherwise unremarkable labs. I discussed the possibility of a CT scan with the patient. She initially agreed to this, however shortly thereafter decided she did not want the study. Her abdominal exam is benign and I feel as though a course of antibiotics for presumed diverticulitis is appropriate. She will be treated with Cipro and Flagyl, and understands to return if her symptoms significantly worsen or change.    Veryl Speak, MD 08/03/15 [phone]

## 2015-08-03 NOTE — ED Notes (Signed)
Per MD, UA is not needed

## 2015-08-19 ENCOUNTER — Ambulatory Visit: Payer: Medicare Other | Admitting: Neurology

## 2015-08-22 ENCOUNTER — Encounter (HOSPITAL_COMMUNITY): Payer: Self-pay

## 2015-08-22 ENCOUNTER — Emergency Department (HOSPITAL_COMMUNITY)
Admission: EM | Admit: 2015-08-22 | Discharge: 2015-08-22 | Disposition: A | Discharge status: Home / Self Care | Payer: Medicare Other | Attending: Emergency Medicine | Admitting: Emergency Medicine

## 2015-08-22 DIAGNOSIS — Z9049 Acquired absence of other specified parts of digestive tract: Secondary | ICD-10-CM | POA: Diagnosis not present

## 2015-08-22 DIAGNOSIS — M1990 Unspecified osteoarthritis, unspecified site: Secondary | ICD-10-CM | POA: Insufficient documentation

## 2015-08-22 DIAGNOSIS — R109 Unspecified abdominal pain: Secondary | ICD-10-CM

## 2015-08-22 DIAGNOSIS — K5900 Constipation, unspecified: Secondary | ICD-10-CM | POA: Insufficient documentation

## 2015-08-22 DIAGNOSIS — R1032 Left lower quadrant pain: Secondary | ICD-10-CM | POA: Insufficient documentation

## 2015-08-22 DIAGNOSIS — Z8719 Personal history of other diseases of the digestive system: Secondary | ICD-10-CM

## 2015-08-22 DIAGNOSIS — Z8739 Personal history of other diseases of the musculoskeletal system and connective tissue: Secondary | ICD-10-CM | POA: Insufficient documentation

## 2015-08-22 DIAGNOSIS — Z79899 Other long term (current) drug therapy: Secondary | ICD-10-CM | POA: Insufficient documentation

## 2015-08-22 DIAGNOSIS — R110 Nausea: Secondary | ICD-10-CM | POA: Diagnosis not present

## 2015-08-22 DIAGNOSIS — E785 Hyperlipidemia, unspecified: Secondary | ICD-10-CM | POA: Insufficient documentation

## 2015-08-22 DIAGNOSIS — F17210 Nicotine dependence, cigarettes, uncomplicated: Secondary | ICD-10-CM | POA: Insufficient documentation

## 2015-08-22 DIAGNOSIS — R6883 Chills (without fever): Secondary | ICD-10-CM | POA: Diagnosis not present

## 2015-08-22 DIAGNOSIS — I10 Essential (primary) hypertension: Secondary | ICD-10-CM | POA: Insufficient documentation

## 2015-08-22 LAB — COMPREHENSIVE METABOLIC PANEL
ALK PHOS: 44 U/L (ref 38–126)
ALT: 22 U/L (ref 14–54)
ANION GAP: 10 (ref 5–15)
AST: 20 U/L (ref 15–41)
Albumin: 3.7 g/dL (ref 3.5–5.0)
BILIRUBIN TOTAL: 1 mg/dL (ref 0.3–1.2)
BUN: 11 mg/dL (ref 6–20)
CALCIUM: 8.8 mg/dL — AB (ref 8.9–10.3)
CO2: 28 mmol/L (ref 22–32)
CREATININE: 0.76 mg/dL (ref 0.44–1.00)
Chloride: 103 mmol/L (ref 101–111)
Glucose, Bld: 127 mg/dL — ABNORMAL HIGH (ref 65–99)
Potassium: 3.4 mmol/L — ABNORMAL LOW (ref 3.5–5.1)
Sodium: 141 mmol/L (ref 135–145)
TOTAL PROTEIN: 7.4 g/dL (ref 6.5–8.1)

## 2015-08-22 LAB — URINALYSIS, ROUTINE W REFLEX MICROSCOPIC
Bilirubin Urine: NEGATIVE
GLUCOSE, UA: NEGATIVE mg/dL
Ketones, ur: NEGATIVE mg/dL
Leukocytes, UA: NEGATIVE
Nitrite: NEGATIVE
PH: 7 (ref 5.0–8.0)
PROTEIN: NEGATIVE mg/dL
Specific Gravity, Urine: 1.006 (ref 1.005–1.030)

## 2015-08-22 LAB — CBC
HEMATOCRIT: 42.7 % (ref 36.0–46.0)
Hemoglobin: 14.5 g/dL (ref 12.0–15.0)
MCH: 32.8 pg (ref 26.0–34.0)
MCHC: 34 g/dL (ref 30.0–36.0)
MCV: 96.6 fL (ref 78.0–100.0)
PLATELETS: 322 10*3/uL (ref 150–400)
RBC: 4.42 MIL/uL (ref 3.87–5.11)
RDW: 13.9 % (ref 11.5–15.5)
WBC: 9 10*3/uL (ref 4.0–10.5)

## 2015-08-22 LAB — URINE MICROSCOPIC-ADD ON

## 2015-08-22 LAB — LIPASE, BLOOD: Lipase: <10 U/L — ABNORMAL LOW (ref 11–51)

## 2015-08-22 MED ORDER — CIPROFLOXACIN HCL 500 MG PO TABS: 500.0000 mg | ORAL_TABLET | Freq: Two times a day (BID) | ORAL | Status: DC

## 2015-08-22 MED ORDER — METRONIDAZOLE 500 MG PO TABS: 500.0000 mg | ORAL_TABLET | Freq: Two times a day (BID) | ORAL | Status: DC

## 2015-08-22 MED ORDER — SODIUM CHLORIDE 0.9 % IV BOLUS (SEPSIS)
500.0000 mL | Freq: Once | INTRAVENOUS | Status: AC
  Administered 2015-08-22: 500 mL via INTRAVENOUS

## 2015-08-22 MED ORDER — MORPHINE SULFATE (PF) 4 MG/ML IV SOLN
4.0000 mg | Freq: Once | INTRAVENOUS | Status: AC
  Administered 2015-08-22: 4 mg via INTRAVENOUS
  Filled 2015-08-22: qty 1

## 2015-08-22 MED ORDER — ONDANSETRON HCL 4 MG/2ML IJ SOLN: 4.0000 mg | Freq: Once | INTRAMUSCULAR | Status: DC | PRN

## 2015-08-22 MED ORDER — ONDANSETRON HCL 4 MG/2ML IJ SOLN
4.0000 mg | INTRAMUSCULAR | Status: AC
  Administered 2015-08-22: 4 mg via INTRAVENOUS
  Filled 2015-08-22: qty 2

## 2015-08-22 MED ORDER — POTASSIUM CHLORIDE CRYS ER 20 MEQ PO TBCR
40.0000 meq | EXTENDED_RELEASE_TABLET | Freq: Once | ORAL | Status: AC
  Administered 2015-08-22: 40 meq via ORAL
  Filled 2015-08-22: qty 2

## 2015-08-22 NOTE — ED Notes (Signed)
Pt complains of chills, nausea, ans abd pain, she thinks her diverticulitis is coming back

## 2015-08-22 NOTE — ED Notes (Signed)
RN at bedside drawing blood.

## 2015-08-22 NOTE — ED Provider Notes (Signed)
CSN: [id number]     Arrival date & time 08/22/15  0542 History   First MD Initiated Contact with Patient 08/22/15 0601     Chief Complaint  Patient presents with  . Abdominal Pain    HPI   [name] is a 65 y.o. female with a PMH of diverticulitis, HTN, HLD who presents to the ED with left lower quadrant abdominal pain x 2 days. She states her pain is constant. She denies exacerbating factors. She has tried her home pain medication with minimal symptom relief. She denies fever, though reports chills. She notes associated nausea. She denies vomiting, diarrhea. She states she is typically constipated, however reports she had a normal bowel movement this morning. She denies hematochezia or melena. She denies dysuria, urgency, frequency. She reports her symptoms feel consistent with her history of diverticulitis.   Past Medical History  Diagnosis Date  . Diverticulitis   . Cervical spinal stenosis   . Hypertension   . Hypercholesteremia   . Arthritis   . Complication of anesthesia     "makes me cry for days."  . Family history of anesthesia complication     Sister has nausea and vomitting.   Past Surgical History  Procedure Laterality Date  . Thoracic fusion  1992  . Tumor removal      small intestine  . Anterior cervical decomp/discectomy fusion  12/30/11  . Abdominal hysterectomy  1995  . Colectomy  2004    "lost 1/3 of my colon"  . Back surgery  1992    Family History  Problem Relation Age of Onset  . Anesthesia problems Sister    Social History  Substance Use Topics  . Smoking status: Current Every Day Smoker -- 0.50 packs/day for 40 years    Types: Cigarettes  . Smokeless tobacco: Never Used  . Alcohol Use: No   OB History    No data available      Review of Systems  Constitutional: Positive for chills. Negative for fever.  Gastrointestinal: Positive for nausea, abdominal pain and constipation. Negative for vomiting and diarrhea.  Genitourinary: Negative for  dysuria, urgency and frequency.  All other systems reviewed and are negative.     Allergies  Review of patient's allergies indicates no known allergies.  Home Medications   Prior to Admission medications   Medication Sig Start Date End Date Taking? Authorizing Provider  acetaminophen (TYLENOL) 500 MG tablet Take 1,000 mg by mouth every 6 (six) hours as needed (pain).   Yes Historical Provider, MD  ALPRAZolam Duanne Moron) 0.5 MG tablet Take 1 tablet by mouth every 6 (six) hours as needed. anxiety 02/20/15  Yes Historical Provider, MD  ANUCORT-HC 25 MG suppository unwrap and insert 1 suppository rectally at bedtime 07/08/15  Yes Historical Provider, MD  Ascorbic Acid (VITAMIN C PO) Take 1 tablet by mouth 3 (three) times a week.    Yes Historical Provider, MD  ferrous sulfate 325 (65 FE) MG tablet Take 325 mg by mouth 3 (three) times a week.   Yes Historical Provider, MD  GLUCOSAMINE PO Take 1 tablet by mouth 3 (three) times a week.   Yes Historical Provider, MD  HYDROcodone-acetaminophen (NORCO) 5-325 MG tablet Take 1-2 tablets by mouth every 6 (six) hours as needed. Patient taking differently: Take 1-2 tablets by mouth every 6 (six) hours as needed for moderate pain.  08/03/15  Yes Veryl Speak, MD  Multiple Vitamins-Minerals (MULTIVITAMIN WITH MINERALS) tablet Take 1 tablet by mouth 3 (three) times a  week.    Yes Historical Provider, MD  Loma Boston Calcium ([employer] OYSTER SHELL CALCIUM) 500 MG TABS Take 1 tablet by mouth 3 (three) times a week.   Yes Historical Provider, MD  primidone (MYSOLINE) 50 MG tablet Take 1 tablet (50 mg total) by mouth at bedtime. 07/30/15  Yes Rebecca S Tat, DO  Probiotic Product (PROBIOTIC DAILY PO) Take 1 tablet by mouth every other day.   Yes Historical Provider, MD  valsartan-hydrochlorothiazide (DIOVAN-HCT) 80-12.5 MG per tablet Take 0.5 tablets by mouth every morning.    Yes Historical Provider, MD  Vitamin D, Ergocalciferol, (DRISDOL) 50000 UNITS CAPS capsule Take 1  capsule by mouth once a week. ON [facility] 03/27/15  Yes Historical Provider, MD  ZETIA 10 MG tablet Take 10 mg by mouth every evening.  07/21/15  Yes Historical Provider, MD  ciprofloxacin (CIPRO) 500 MG tablet Take 1 tablet (500 mg total) by mouth 2 (two) times daily. 08/22/15   Marella Chimes, PA-C  metroNIDAZOLE (FLAGYL) 500 MG tablet Take 1 tablet (500 mg total) by mouth 2 (two) times daily. 08/22/15   Marella Chimes, PA-C  predniSONE (DELTASONE) 10 MG tablet Take q day 6,5,4,3,2,1 Patient not taking: Reported on 08/03/2015 04/22/15   Daleen Bo, MD    BP 137/84 mmHg  Pulse 55  Temp(Src) 98.3 F (36.8 C) (Oral)  Resp 20  SpO2 92% Physical Exam  Constitutional: She is oriented to person, place, and time. She appears well-developed and well-nourished. No distress.  HENT:  Head: Normocephalic and atraumatic.  Right Ear: External ear normal.  Left Ear: External ear normal.  Nose: Nose normal.  Mouth/Throat: Uvula is midline, oropharynx is clear and moist and mucous membranes are normal.  Eyes: Conjunctivae, EOM and lids are normal. Pupils are equal, round, and reactive to light. Right eye exhibits no discharge. Left eye exhibits no discharge. No scleral icterus.  Neck: Normal range of motion. Neck supple.  Cardiovascular: Normal rate, regular rhythm, normal heart sounds, intact distal pulses and normal pulses.   Pulmonary/Chest: Effort normal and breath sounds normal. No respiratory distress. She has no wheezes. She has no rales.  Abdominal: Soft. Normal appearance and bowel sounds are normal. She exhibits no distension and no mass. There is tenderness. There is no rigidity, no rebound and no guarding.  TTP in LLQ. No rebound, guarding, or masses.  Musculoskeletal: Normal range of motion. She exhibits no edema or tenderness.  Neurological: She is alert and oriented to person, place, and time. She has normal strength. No sensory deficit.  Skin: Skin is warm, dry and intact. No  rash noted. She is not diaphoretic. No erythema. No pallor.  Psychiatric: She has a normal mood and affect. Her speech is normal and behavior is normal.  Nursing note and vitals reviewed.   ED Course  Procedures (including critical care time)  Labs Review Labs Reviewed  URINALYSIS, ROUTINE W REFLEX MICROSCOPIC (NOT AT [facility]) - Abnormal; Notable for the following:    Hgb urine dipstick TRACE (*)    All other components within normal limits  COMPREHENSIVE METABOLIC PANEL - Abnormal; Notable for the following:    Potassium 3.4 (*)    Glucose, Bld 127 (*)    Calcium 8.8 (*)    All other components within normal limits  LIPASE, BLOOD - Abnormal; Notable for the following:    Lipase <10 (*)    All other components within normal limits  URINE MICROSCOPIC-ADD ON - Abnormal; Notable for the following:  Squamous Epithelial / LPF 0-5 (*)    Bacteria, UA RARE (*)    All other components within normal limits  CBC    Imaging Review No results found.   I have personally reviewed and evaluated these images and lab results as part of my medical decision-making.   EKG Interpretation None      MDM   Final diagnoses:  Abdominal pain, unspecified abdominal location  History of diverticulitis    65 year old female presents with abdominal pain as well as associated chills and nausea. Reports her symptoms feel consistent with her history of diverticulitis. Denies blood in her stool. Reports her last normal bowel movement was this morning prior to arrival. Patient evaluated 12/18 for the same symptoms, and was subsequently discharged with an antibiotic course. Patient reports initial symptom improvement prior to recurrence of abdominal pain.  Patient is afebrile. Vital signs stable. Heart regular rate and rhythm. Lungs clear to auscultation bilaterally. Abdomen soft, nondistended, with mild tenderness palpation in left lower quadrant. No rebound, guarding, or masses. No CVA tenderness.  CBC  negative for leukocytosis or anemia. CMP remarkable for potassium 3.4, patient given oral potassium in the ED. Lipase within normal limits. UA negative for infection.   Patient discussed with and seen by Dr. Regenia Skeeter. She declines CT abdomen pelvis, and per record review, patient has had several CT abdomen pelvis that have been negative for diverticulitis. Will discharge with cipro and flagyl given patient states symptoms consistent with history of diverticulitis and has LLQ tenderness on exam. Patient states she has pain medication at home. Patient to follow up with GI (she sees Dr. Collene Mares). Return precautions discussed. Patient verbalizes her understanding and is in agreement with plan.  BP 137/84 mmHg  Pulse 55  Temp(Src) 98.3 F (36.8 C) (Oral)  Resp 20  SpO2 92%     Marella Chimes, PA-C 08/22/15 [id number]  Sherwood Gambler, MD 08/22/15 1013

## 2015-08-22 NOTE — Discharge Instructions (Signed)
1. Medications: usual home medications 2. Treatment: rest, drink plenty of fluids  3. Follow Up: please followup with your gastroenterologist (call Monday to make appointment) for discussion of your diagnoses and further evaluation after today's visit; if you do not have a primary care doctor use the resource guide provided to find one; please return to the ER for high fever, severe pain, blood in your stool, new or worsening symptoms   Abdominal Pain, Adult Many things can cause abdominal pain. Usually, abdominal pain is not caused by a disease and will improve without treatment. It can often be observed and treated at home. Your health care provider will do a physical exam and possibly order blood tests and X-rays to help determine the seriousness of your pain. However, in many cases, more time must pass before a clear cause of the pain can be found. Before that point, your health care provider may not know if you need more testing or further treatment. HOME CARE INSTRUCTIONS Monitor your abdominal pain for any changes. The following actions may help to alleviate any discomfort you are experiencing:  Only take over-the-counter or prescription medicines as directed by your health care provider.  Do not take laxatives unless directed to do so by your health care provider.  Try a clear liquid diet (broth, tea, or water) as directed by your health care provider. Slowly move to a bland diet as tolerated. SEEK MEDICAL CARE IF:  You have unexplained abdominal pain.  You have abdominal pain associated with nausea or diarrhea.  You have pain when you urinate or have a bowel movement.  You experience abdominal pain that wakes you in the night.  You have abdominal pain that is worsened or improved by eating food.  You have abdominal pain that is worsened with eating fatty foods.  You have a fever. SEEK IMMEDIATE MEDICAL CARE IF:  Your pain does not go away within 2 hours.  You keep throwing  up (vomiting).  Your pain is felt only in portions of the abdomen, such as the right side or the left lower portion of the abdomen.  You pass bloody or black tarry stools. MAKE SURE YOU:  Understand these instructions.  Will watch your condition.  Will get help right away if you are not doing well or get worse.   This information is not intended to replace advice given to you by your health care provider. Make sure you discuss any questions you have with your health care provider.   Document Released: 05/12/2005 Document Revised: 04/23/2015 Document Reviewed: 04/11/2013 Elsevier Interactive Patient Education [employer].

## 2015-08-27 ENCOUNTER — Ambulatory Visit: Payer: Medicare Other | Admitting: Neurology

## 2015-09-10 ENCOUNTER — Ambulatory Visit (INDEPENDENT_AMBULATORY_CARE_PROVIDER_SITE_OTHER): Payer: Medicare Other | Admitting: Neurology

## 2015-09-10 ENCOUNTER — Encounter: Payer: Self-pay | Admitting: Neurology

## 2015-09-10 VITALS — BP 128/80 | HR 77 | Ht 65.0 in | Wt 212.0 lb

## 2015-09-10 DIAGNOSIS — R251 Tremor, unspecified: Secondary | ICD-10-CM | POA: Diagnosis not present

## 2015-09-10 NOTE — Progress Notes (Signed)
Subjective:   [name] was seen in consultation in the movement disorder clinic at the request of Dr. Vertell Limber.  Her PCP is Osborne Casco, MD.  The evaluation is for tremor.    The patient is a 65 y.o. right handed female with a history of tremor.  Tremor is in "arms and hands."  She states that she has had L arm tremor for 5-10 years; she notices it when using the hand.  She states that she was thinking that it was due to a pinched nerve.  She saw Dr. Erling Cruz for it and was told that it was essential tremor.  She states that she had a cervical fusion 3 years ago and when "I woke up I couldn't move the right arm."  She states that tremor in the right arm started after that.  She was tried on lyrica but states that it made tremor on the L worse.  She states that gabapentin had a similar result.  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  There is no family hx of tremor.    Affected by caffeine: unknown, as doesn't drink much Affected by alcohol:  Unknown as doesn't drink Affected by stress:  No. Affected by fatigue:  No. Spills soup if on spoon:  Yes.   Spills glass of liquid if full:  Yes.   Affects ADL's (tying shoes, brushing teeth, etc): some (notices it when pulls up pants)  02/05/15 update:  The patient returns today for follow-up.  I started her on primidone last visit.  The patient reports this is definitely helping her tremor.  She denies any side effects with the medication.  I got labs from her PCP since last visit.  They were from 06/2014 but states that she had labs few weeks ago.  She had normal HgbA1C of 5.8.  Her chemistry was normal as was ast/alt.  CBC was normal.  09/10/15 update:  The patient is following up today regarding essential tremor.  She is on primidone, 50 daily.  "It has cooled tremor down."  States that the primidone worked on the L  hand but not on the right, because she is convinced that the right is from nerve damage and "that medication doesn't help nerve damage."  I checked her TSH last visit and it was normal at 1.65.  Outside reports reviewed: historical medical records.  No Known Allergies  Outpatient Encounter Prescriptions as of 09/10/2015  Medication Sig  . ALPRAZolam (XANAX) 0.5 MG tablet Take 1 tablet by mouth every 6 (six) hours as needed. anxiety  . Ascorbic Acid (VITAMIN C PO) Take 1 tablet by mouth 3 (three) times a week.   . ferrous sulfate 325 (65 FE) MG tablet Take 325 mg by mouth 3 (three) times a week.  Marland Kitchen GLUCOSAMINE PO Take 1 tablet by mouth 3 (three) times a week.  Marland Kitchen HYDROcodone-acetaminophen (NORCO) 5-325 MG tablet Take 1-2 tablets by mouth every 6 (six) hours as needed. (Patient taking differently: Take 1-2 tablets by mouth every 6 (six) hours as needed for moderate pain. )  . Multiple Vitamins-Minerals (MULTIVITAMIN WITH MINERALS) tablet Take 1 tablet by mouth 3 (three) times a week.   . primidone (MYSOLINE) 50 MG tablet Take 1 tablet (50 mg total) by mouth at bedtime.  . Probiotic Product (PROBIOTIC DAILY PO) Take 1  tablet by mouth every other day.  . valsartan-hydrochlorothiazide (DIOVAN-HCT) 80-12.5 MG per tablet Take 0.5 tablets by mouth every morning.   . Vitamin D, Ergocalciferol, (DRISDOL) 50000 UNITS CAPS capsule Take 1 capsule by mouth once a week. ON WEDNESDAYS  . ZETIA 10 MG tablet Take 10 mg by mouth every evening.   . [DISCONTINUED] acetaminophen (TYLENOL) 500 MG tablet Take 1,000 mg by mouth every 6 (six) hours as needed (pain).  . [DISCONTINUED] ANUCORT-HC 25 MG suppository unwrap and insert 1 suppository rectally at bedtime  . [DISCONTINUED] ciprofloxacin (CIPRO) 500 MG tablet Take 1 tablet (500 mg total) by mouth 2 (two) times daily.  . [DISCONTINUED] metroNIDAZOLE (FLAGYL) 500 MG tablet Take 1 tablet (500 mg total) by mouth 2 (two) times daily.  . [DISCONTINUED] Loma Boston  Calcium ([employer] OYSTER SHELL CALCIUM) 500 MG TABS Take 1 tablet by mouth 3 (three) times a week.  . [DISCONTINUED] predniSONE (DELTASONE) 10 MG tablet Take q day 6,5,4,3,2,1 (Patient not taking: Reported on 08/03/2015)   No facility-administered encounter medications on file as of 09/10/2015.    Past Medical History  Diagnosis Date  . Diverticulitis   . Cervical spinal stenosis   . Hypertension   . Hypercholesteremia   . Arthritis   . Complication of anesthesia     "makes me cry for days."  . Family history of anesthesia complication     Sister has nausea and vomitting.    Past Surgical History  Procedure Laterality Date  . Thoracic fusion  1992  . Tumor removal      small intestine  . Anterior cervical decomp/discectomy fusion  12/30/11  . Abdominal hysterectomy  1995  . Colectomy  2004    "lost 1/3 of my colon"  . Back surgery  1992     Social History   Social History  . Marital Status: Divorced    Spouse Name: N/A  . Number of Children: N/A  . Years of Education: N/A   Occupational History  . retired     Press photographer, Teaching laboratory technician   Social History Main Topics  . Smoking status: Current Every Day Smoker -- 0.50 packs/day for 40 years    Types: Cigarettes  . Smokeless tobacco: Never Used  . Alcohol Use: No  . Drug Use: No  . Sexual Activity: No   Other Topics Concern  . Not on file   Social History Narrative    Family Status  Relation Status Death Age  . Mother Deceased     healthy   . Father Deceased     healthy  . Sister Alive     healthy  . Son Alive     healthy    Review of Systems A complete 10 system ROS was obtained and was negative apart from what is mentioned.   Objective:   VITALS:   Filed Vitals:   09/10/15 1026  BP: 128/80  Pulse: 77  Height: 5\' 5"  (1.651 m)  Weight: 212 lb (96.163 kg)   Gen:  Appears stated age and in NAD. HEENT:  Normocephalic, atraumatic. The mucous membranes are moist. The superficial temporal arteries are without  ropiness or tenderness. Cardiovascular: Regular rate and rhythm. Lungs: Clear to auscultation bilaterally. Neck: There are no carotid bruits noted bilaterally.  NEUROLOGICAL:  Orientation:  The patient is alert and oriented x 3.  Cranial nerves: There is good facial symmetry.  Speech has intermittent stuttering quality. Soft palate rises symmetrically and there is no tongue deviation. Hearing is intact to  conversational tone. Tone: Tone is good throughout. Sensation: Sensation is intact to light touch throughout Coordination:  The patient has no dysdiadichokinesia or dysmetria.  Motor: Strength is 5/5 in the bilateral upper and lower extremities.  Shoulder shrug is equal bilaterally.  There is no pronator drift.  There are no fasciculations noted. Gait and Station: The patient is able to ambulate without difficulty. Initially has tremor of the right had with ambulation but then abates with longer walking  MOVEMENT EXAM: Tremor:  There is tremor in the UE, noted most significantly with action. There are nonphysiologic aspects.  When holding out the hands and asked to tap a rhythm with the opposite hand, the tremor will match the frequency of the rhythm tapped.  When asked to approximate but not touch the 2 pointer fingers together, she touches them together as if there is a magnet on both fingers and they are being pulled together  LABS    Chemistry      Component Value Date/Time   NA 141 08/22/2015 0650   K 3.4* 08/22/2015 0650   CL 103 08/22/2015 0650   CO2 28 08/22/2015 0650   BUN 11 08/22/2015 0650   CREATININE 0.76 08/22/2015 0650      Component Value Date/Time   CALCIUM 8.8* 08/22/2015 0650   ALKPHOS 44 08/22/2015 0650   AST 20 08/22/2015 0650   ALT 22 08/22/2015 0650   BILITOT 1.0 08/22/2015 0650          Assessment/Plan:   1.   Tremor.  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   I do not think that it is a result of her neck surgeries or "pinched  nerves" as she has been worried about;  I talked to her about this again today but she is not convinced.  She doesn't want to increase primidone as it won't help her "pinched nerve damage" but wants to stay on the 50 mg daily.   -told her that OT eval may be able to help her arm/neck pain but doesn't wish to have referral.  Tried therapy years ago. 2.  Tob abuse  -discussed importance of tobacco cessation 3.  Doesn't wish to f/u here right now; states that since not changing meds will get RF from PCP.  Will f/u on prn basis.

## 2015-10-01 DIAGNOSIS — L820 Inflamed seborrheic keratosis: Secondary | ICD-10-CM | POA: Diagnosis not present

## 2015-10-01 DIAGNOSIS — L309 Dermatitis, unspecified: Secondary | ICD-10-CM | POA: Diagnosis not present

## 2015-10-01 DIAGNOSIS — L821 Other seborrheic keratosis: Secondary | ICD-10-CM | POA: Diagnosis not present

## 2015-10-01 DIAGNOSIS — D485 Neoplasm of uncertain behavior of skin: Secondary | ICD-10-CM | POA: Diagnosis not present

## 2015-10-01 DIAGNOSIS — B078 Other viral warts: Secondary | ICD-10-CM | POA: Diagnosis not present

## 2015-10-15 DIAGNOSIS — R251 Tremor, unspecified: Secondary | ICD-10-CM | POA: Diagnosis not present

## 2015-10-15 DIAGNOSIS — Z6835 Body mass index (BMI) 35.0-35.9, adult: Secondary | ICD-10-CM | POA: Diagnosis not present

## 2015-10-15 DIAGNOSIS — M5022 Other cervical disc displacement, mid-cervical region, unspecified level: Secondary | ICD-10-CM | POA: Diagnosis not present

## 2015-10-15 DIAGNOSIS — M129 Arthropathy, unspecified: Secondary | ICD-10-CM | POA: Diagnosis not present

## 2015-10-15 DIAGNOSIS — M542 Cervicalgia: Secondary | ICD-10-CM | POA: Diagnosis not present

## 2015-10-24 ENCOUNTER — Encounter: Payer: Self-pay | Admitting: Neurology

## 2015-10-24 ENCOUNTER — Other Ambulatory Visit: Payer: Medicare Other

## 2015-10-24 ENCOUNTER — Ambulatory Visit (INDEPENDENT_AMBULATORY_CARE_PROVIDER_SITE_OTHER): Payer: Medicare Other | Admitting: Neurology

## 2015-10-24 VITALS — BP 132/80 | HR 75 | Ht 65.0 in | Wt 216.0 lb

## 2015-10-24 DIAGNOSIS — R251 Tremor, unspecified: Secondary | ICD-10-CM

## 2015-10-24 DIAGNOSIS — Z720 Tobacco use: Secondary | ICD-10-CM | POA: Diagnosis not present

## 2015-10-24 DIAGNOSIS — Z7729 Contact with and (suspected ) exposure to other hazardous substances: Secondary | ICD-10-CM

## 2015-10-24 DIAGNOSIS — I10 Essential (primary) hypertension: Secondary | ICD-10-CM

## 2015-10-24 NOTE — Progress Notes (Signed)
Subjective:   [name] was seen in consultation in the movement disorder clinic at the request of Dr. Vertell Limber.  Her PCP is Osborne Casco, MD.  The evaluation is for tremor.    The patient is a 65 y.o. right handed female with a history of tremor.  Tremor is in "arms and hands."  She states that she has had L arm tremor for 5-10 years; she notices it when using the hand.  She states that she was thinking that it was due to a pinched nerve.  She saw Dr. Erling Cruz for it and was told that it was essential tremor.  She states that she had a cervical fusion 3 years ago and when "I woke up I couldn't move the right arm."  She states that tremor in the right arm started after that.  She was tried on lyrica but states that it made tremor on the L worse.  She states that gabapentin had a similar result.  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  There is no family hx of tremor.    Affected by caffeine: unknown, as doesn't drink much Affected by alcohol:  Unknown as doesn't drink Affected by stress:  No. Affected by fatigue:  No. Spills soup if on spoon:  Yes.   Spills glass of liquid if full:  Yes.   Affects ADL's (tying shoes, brushing teeth, etc): some (notices it when pulls up pants)  02/05/15 update:  The patient returns today for follow-up.  I started her on primidone last visit.  The patient reports this is definitely helping her tremor.  She denies any side effects with the medication.  I got labs from her PCP since last visit.  They were from 06/2014 but states that she had labs few weeks ago.  She had normal HgbA1C of 5.8.  Her chemistry was normal as was ast/alt.  CBC was normal.  09/10/15 update:  The patient is following up today regarding essential tremor.  She is on primidone, 50 daily.  "It has cooled tremor down."  States that the primidone worked on the L  hand but not on the right, because she is convinced that the right is from nerve damage and "that medication doesn't help nerve damage."  I checked her TSH last visit and it was normal at 1.65.  10/24/15 update:  The patient is following up today regarding essential tremor.  She is on primidone, 50 daily.  I checked her TSH last visit and it was normal at 1.65.  She was not going to f/u here any longer but requested visit today as she states that she has "figured out" the source of her tremor and that was a "faulty furnace."  States that she had her furnace replaced a year and half ago and then a repair man came back this year to service the furnace and the repair guy suggested that she has been exposed to [place] for years.  States that this has been the source of her sickness for years.   When I asked her why the sx's didn't get better when she got her new furnace, she states that she thinks that because the sx's just last for a long time.  She has not been to her PCP to discuss this.  She admits that she is very distraught over  this and thinks that the medical community "laughs at me" when she goes to the doctor.  Outside reports reviewed: historical medical records.  No Known Allergies  Outpatient Encounter Prescriptions as of 10/24/2015  Medication Sig  . ALPRAZolam (XANAX) 0.5 MG tablet Take 1 tablet by mouth every 6 (six) hours as needed (patient reports she takes one a week). anxiety  . Ascorbic Acid (VITAMIN C PO) Take 1 tablet by mouth 3 (three) times a week.   . ferrous sulfate 325 (65 FE) MG tablet Take 325 mg by mouth 3 (three) times a week.  Marland Kitchen GLUCOSAMINE PO Take 1 tablet by mouth 3 (three) times a week.  Marland Kitchen HYDROcodone-acetaminophen (NORCO) 5-325 MG tablet Take 1-2 tablets by mouth every 6 (six) hours as needed. (Patient taking differently: Take 1-2 tablets by mouth every 6 (six) hours as needed for moderate pain. )  . Multiple Vitamins-Minerals (MULTIVITAMIN WITH MINERALS) tablet Take 1 tablet  by mouth 3 (three) times a week.   . primidone (MYSOLINE) 50 MG tablet Take 1 tablet (50 mg total) by mouth at bedtime.  . Probiotic Product (PROBIOTIC DAILY PO) Take 1 tablet by mouth every other day.  . valsartan-hydrochlorothiazide (DIOVAN-HCT) 80-12.5 MG per tablet Take 0.5 tablets by mouth every morning.   . Vitamin D, Ergocalciferol, (DRISDOL) 50000 UNITS CAPS capsule Take 1 capsule by mouth once a week. ON WEDNESDAYS  . [DISCONTINUED] ZETIA 10 MG tablet Take 10 mg by mouth every evening.    No facility-administered encounter medications on file as of 10/24/2015.    Past Medical History  Diagnosis Date  . Diverticulitis   . Cervical spinal stenosis   . Hypertension   . Hypercholesteremia   . Arthritis   . Complication of anesthesia     "makes me cry for days."  . Family history of anesthesia complication     Sister has nausea and vomitting.    Past Surgical History  Procedure Laterality Date  . Thoracic fusion  1992  . Tumor removal      small intestine  . Anterior cervical decomp/discectomy fusion  12/30/11  . Abdominal hysterectomy  1995  . Colectomy  2004    "lost 1/3 of my colon"  . Back surgery  1992     Social History   Social History  . Marital Status: Divorced    Spouse Name: N/A  . Number of Children: N/A  . Years of Education: N/A   Occupational History  . retired     Press photographer, Teaching laboratory technician   Social History Main Topics  . Smoking status: Current Every Day Smoker -- 0.50 packs/day for 40 years    Types: Cigarettes  . Smokeless tobacco: Never Used  . Alcohol Use: No  . Drug Use: No  . Sexual Activity: No   Other Topics Concern  . Not on file   Social History Narrative    Family Status  Relation Status Death Age  . Mother Deceased     healthy   . Father Deceased     healthy  . Sister Alive     healthy  . Son Alive     healthy    Review of Systems A complete 10 system ROS was obtained and was negative apart from what is mentioned.     Objective:   VITALS:   Filed Vitals:   10/24/15 1033  BP: 132/80  Pulse: 75  Height: 5\' 5"  (1.651 m)  Weight: 216 lb (97.977 kg)   Gen:  Appears stated  age and in NAD.  She is very tearful during the visit today HEENT:  Normocephalic, atraumatic. The mucous membranes are moist. The superficial temporal arteries are without ropiness or tenderness. Cardiovascular: Regular rate and rhythm. Lungs: Clear to auscultation bilaterally. Neck: There are no carotid bruits noted bilaterally.  NEUROLOGICAL:  Orientation:  The patient is alert and oriented x 3.  Cranial nerves: There is good facial symmetry.  Speech has no stuttering quality. Soft palate rises symmetrically and there is no tongue deviation. Hearing is intact to conversational tone. Tone: Tone is good throughout. Sensation: Sensation is intact to light touch throughout Coordination:  The patient has no dysdiadichokinesia or dysmetria.  Motor: Strength is at least 5-/5 in the upper extremities but there is give way weakness that improves with encouragement.  Grip strength is equal but not strong bilaterally but improves with encouragement.  Shoulder Shrug is equal bilaterally.  There is no pronator drift.  There are no fasciculations noted. Gait and Station: The patient is able to ambulate without difficulty.  MOVEMENT EXAM: Tremor:  There is tremor in the UE, noted most significantly with action. There are nonphysiologic aspects.  When holding out the hands and asked to tap a rhythm with the opposite hand, the tremor will match the frequency of the rhythm tapped.  She does have slight tremor of the L pinky finger on the L at rest.    LABS    Chemistry      Component Value Date/Time   NA 141 08/22/2015 0650   K 3.4* 08/22/2015 0650   CL 103 08/22/2015 0650   CO2 28 08/22/2015 0650   BUN 11 08/22/2015 0650   CREATININE 0.76 08/22/2015 0650      Component Value Date/Time   CALCIUM 8.8* 08/22/2015 0650   ALKPHOS 44  08/22/2015 0650   AST 20 08/22/2015 0650   ALT 22 08/22/2015 0650   BILITOT 1.0 08/22/2015 0650          Assessment/Plan:   1.   Tremor.  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   I do not think that it is a result of her neck surgeries or "pinched nerves" as she was previously worried about.  She is on primidone 50 mg and thinks that it helps some.     -thinks that CO poisoning is the cause of her long term sx's, including tremor.  I told her that it doesn't make sense that she replaced her furnace over a year and a half ago and still says that the last year has been the worst for sickness.  Regardless, she is convinced.  I told her that while an MRI brain can potentially show CO poisoning (or evidence of it in the past), I'm still not sure that this is the issue.  I do expect to see small vessel disease on MRI as she has hx of HTN, hyperlipid and tobacco.  I made it clear to her today that those abnormalities I expect to see are not the same as CO poisoning (usually bilateral globus pallidus injury).  Her tremor is also not characteristic of tremor seen with this.  I will look at her blood as well for CO because she is so upset and distraught but also told her that this is far out of my area of expertise and needs f/u with PCP 2.  Tob abuse  -discussed importance of tobacco cessation 3.  Will call her with results of above.

## 2015-10-24 NOTE — Patient Instructions (Addendum)
1. We have scheduled you at [facility] for your MRI on 11/02/15 at 3:00 pm. Please arrive 30 minutes prior and go to [facility]. If you need to change this appt please call [phone]. 2. Your provider has requested that you have labwork completed today. Please go to [facility] ([id number]) on the second floor of this building before leaving the office today. You do not need to check in. If you are not called within 15 minutes please check with the front desk.

## 2015-10-28 ENCOUNTER — Telehealth: Payer: Self-pay | Admitting: Neurology

## 2015-10-28 NOTE — Telephone Encounter (Signed)
Called peer to peer line regarding initial denial of patients MRI.  Was no option to speak to provider; only option to leave my name and contact information, which is what I did and requested a call back to change determination given tremor and reported hx of CO exposure and to r/o BG lesion

## 2015-10-29 ENCOUNTER — Telehealth: Payer: Self-pay | Admitting: Neurology

## 2015-10-29 NOTE — Telephone Encounter (Signed)
I called back again and again had to leave message.  Left ALL clinical information on the phone along with my dissatisfaction that I could not get anyone on the phone to answer.

## 2015-10-29 NOTE — Telephone Encounter (Signed)
I have now left FOUR messages with patients insurance company re: peer to peer.  I have left all clinical information on their line.  I am no longer willing to continue to call them.  On this last phone call, I just asked that they fax a determination.  If patient wants to further appeal, that is okay and happy to support.

## 2015-10-29 NOTE — Telephone Encounter (Signed)
They called back while you were in a room with a patient. They would not speak to me and would not give me a direct number. They stated you need to call back [phone] - option 1, then option 7.

## 2015-10-30 ENCOUNTER — Telehealth: Payer: Self-pay | Admitting: Internal Medicine

## 2015-10-30 NOTE — Telephone Encounter (Signed)
Ms. Cox's insurance called wanting to do a peer to peer review for her MRI. Please call back, this request will stay open until 11/05/15. [phone]  Ref. # [id number]

## 2015-11-02 ENCOUNTER — Ambulatory Visit
Admission: RE | Admit: 2015-11-02 | Discharge: 2015-11-02 | Disposition: A | Discharge status: Home / Self Care | Payer: Medicare Other | Source: Ambulatory Visit | Attending: Neurology | Admitting: Neurology

## 2015-11-02 DIAGNOSIS — R251 Tremor, unspecified: Secondary | ICD-10-CM

## 2015-11-03 ENCOUNTER — Telehealth: Payer: Self-pay | Admitting: Neurology

## 2015-11-03 NOTE — Telephone Encounter (Signed)
Patient made aware.

## 2015-11-03 NOTE — Telephone Encounter (Signed)
-----   Message from [place], DO sent at 11/03/2015  9:30 AM EDT ----- Reviewed.  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  If she is able and no hx of bleeding (ulcers, etc) recommend baby 81 mg coated ASA daily

## 2015-11-06 ENCOUNTER — Telehealth: Payer: Self-pay | Admitting: Neurology

## 2015-11-06 NOTE — Telephone Encounter (Signed)
Tell pt that while CO level in blood is little high, it may be just because she is a smoker and CO levels in smokers are higher.  Have her f/u with PCP.  Please send copy of these labs to PCP

## 2015-11-06 NOTE — Telephone Encounter (Signed)
Left message on machine for patient to call back. Labs faxed to [facility] at [phone].

## 2015-11-06 NOTE — Telephone Encounter (Signed)
VM-PT left message that she returned your call/Dawn

## 2015-11-06 NOTE — Telephone Encounter (Signed)
Patient made aware.

## 2015-12-02 ENCOUNTER — Emergency Department (HOSPITAL_COMMUNITY)
Admission: EM | Admit: 2015-12-02 | Discharge: 2015-12-02 | Disposition: A | Discharge status: Home / Self Care | Payer: Medicare Other | Attending: Emergency Medicine | Admitting: Emergency Medicine

## 2015-12-02 ENCOUNTER — Encounter (HOSPITAL_COMMUNITY): Payer: Self-pay

## 2015-12-02 DIAGNOSIS — R110 Nausea: Secondary | ICD-10-CM | POA: Diagnosis not present

## 2015-12-02 DIAGNOSIS — R42 Dizziness and giddiness: Secondary | ICD-10-CM | POA: Insufficient documentation

## 2015-12-02 DIAGNOSIS — F17210 Nicotine dependence, cigarettes, uncomplicated: Secondary | ICD-10-CM | POA: Diagnosis not present

## 2015-12-02 DIAGNOSIS — Z8639 Personal history of other endocrine, nutritional and metabolic disease: Secondary | ICD-10-CM | POA: Diagnosis not present

## 2015-12-02 DIAGNOSIS — I10 Essential (primary) hypertension: Secondary | ICD-10-CM | POA: Diagnosis not present

## 2015-12-02 DIAGNOSIS — Z8719 Personal history of other diseases of the digestive system: Secondary | ICD-10-CM | POA: Insufficient documentation

## 2015-12-02 DIAGNOSIS — M1990 Unspecified osteoarthritis, unspecified site: Secondary | ICD-10-CM | POA: Diagnosis not present

## 2015-12-02 DIAGNOSIS — Z79899 Other long term (current) drug therapy: Secondary | ICD-10-CM | POA: Diagnosis not present

## 2015-12-02 DIAGNOSIS — Z8739 Personal history of other diseases of the musculoskeletal system and connective tissue: Secondary | ICD-10-CM | POA: Diagnosis not present

## 2015-12-02 DIAGNOSIS — R531 Weakness: Secondary | ICD-10-CM | POA: Insufficient documentation

## 2015-12-02 LAB — MAGNESIUM: MAGNESIUM: 1.8 mg/dL (ref 1.7–2.4)

## 2015-12-02 LAB — URINALYSIS, ROUTINE W REFLEX MICROSCOPIC
Bilirubin Urine: NEGATIVE
GLUCOSE, UA: NEGATIVE mg/dL
KETONES UR: NEGATIVE mg/dL
LEUKOCYTES UA: NEGATIVE
Nitrite: NEGATIVE
PROTEIN: NEGATIVE mg/dL
SPECIFIC GRAVITY, URINE: 1.011 (ref 1.005–1.030)
pH: 5.5 (ref 5.0–8.0)

## 2015-12-02 LAB — CBC WITH DIFFERENTIAL/PLATELET
BASOS ABS: 0.1 10*3/uL (ref 0.0–0.1)
BASOS PCT: 1 %
EOS ABS: 0.1 10*3/uL (ref 0.0–0.7)
Eosinophils Relative: 1 %
HEMATOCRIT: 43.8 % (ref 36.0–46.0)
Hemoglobin: 15.8 g/dL — ABNORMAL HIGH (ref 12.0–15.0)
Lymphocytes Relative: 37 %
Lymphs Abs: 3.8 10*3/uL (ref 0.7–4.0)
MCH: 32.7 pg (ref 26.0–34.0)
MCHC: 36.1 g/dL — AB (ref 30.0–36.0)
MCV: 90.7 fL (ref 78.0–100.0)
MONO ABS: 0.8 10*3/uL (ref 0.1–1.0)
Monocytes Relative: 7 %
NEUTROS ABS: 5.7 10*3/uL (ref 1.7–7.7)
Neutrophils Relative %: 54 %
PLATELETS: 265 10*3/uL (ref 150–400)
RBC: 4.83 MIL/uL (ref 3.87–5.11)
RDW: 13.4 % (ref 11.5–15.5)
WBC: 10.4 10*3/uL (ref 4.0–10.5)

## 2015-12-02 LAB — COMPREHENSIVE METABOLIC PANEL
ALBUMIN: 4.4 g/dL (ref 3.5–5.0)
ALT: 36 U/L (ref 14–54)
ANION GAP: 8 (ref 5–15)
AST: 26 U/L (ref 15–41)
Alkaline Phosphatase: 47 U/L (ref 38–126)
BUN: 15 mg/dL (ref 6–20)
CHLORIDE: 106 mmol/L (ref 101–111)
CO2: 25 mmol/L (ref 22–32)
Calcium: 9.9 mg/dL (ref 8.9–10.3)
Creatinine, Ser: 0.79 mg/dL (ref 0.44–1.00)
GFR calc Af Amer: >60 mL/min (ref >60)
GFR calc non Af Amer: >60 mL/min (ref >60)
GLUCOSE: 95 mg/dL (ref 65–99)
POTASSIUM: 3.9 mmol/L (ref 3.5–5.1)
SODIUM: 139 mmol/L (ref 135–145)
Total Bilirubin: 0.9 mg/dL (ref 0.3–1.2)
Total Protein: 9 g/dL — ABNORMAL HIGH (ref 6.5–8.1)

## 2015-12-02 LAB — URINE MICROSCOPIC-ADD ON: Bacteria, UA: NONE SEEN

## 2015-12-02 MED ORDER — ONDANSETRON HCL 4 MG/2ML IJ SOLN
4.0000 mg | Freq: Once | INTRAMUSCULAR | Status: AC
  Administered 2015-12-02: 4 mg via INTRAVENOUS
  Filled 2015-12-02: qty 2

## 2015-12-02 MED ORDER — MECLIZINE HCL 25 MG PO TABS: 25.0000 mg | ORAL_TABLET | Freq: Three times a day (TID) | ORAL | Status: DC | PRN

## 2015-12-02 MED ORDER — SODIUM CHLORIDE 0.9 % IV BOLUS (SEPSIS)
1000.0000 mL | Freq: Once | INTRAVENOUS | Status: AC
  Administered 2015-12-02: 1000 mL via INTRAVENOUS

## 2015-12-02 MED ORDER — DIPHENHYDRAMINE HCL 50 MG/ML IJ SOLN
12.5000 mg | Freq: Once | INTRAMUSCULAR | Status: AC
  Administered 2015-12-02: 12.5 mg via INTRAVENOUS
  Filled 2015-12-02: qty 1

## 2015-12-02 MED ORDER — ONDANSETRON 4 MG PO TBDP: 4.0000 mg | ORAL_TABLET | Freq: Three times a day (TID) | ORAL | Status: DC | PRN

## 2015-12-02 MED ORDER — MECLIZINE HCL 25 MG PO TABS
25.0000 mg | ORAL_TABLET | Freq: Once | ORAL | Status: AC
  Administered 2015-12-02: 25 mg via ORAL
  Filled 2015-12-02: qty 1

## 2015-12-02 NOTE — ED Notes (Signed)
Pt with nausea, weakness and dizziness x months.  Has been seen and evaluated by primary MD and hospitals with no diagnosis.  No vomiting.  One loose stool a day.  Decreased appetite.

## 2015-12-02 NOTE — ED Notes (Signed)
Pt. Is unable to use the restroom at this time, but is aware that we need a urine specimen.  

## 2015-12-02 NOTE — ED Provider Notes (Signed)
CSN: [id number]     Arrival date & time 12/02/15  0935 History   First MD Initiated Contact with Patient 12/02/15 1025     Chief Complaint  Patient presents with  . Nausea  . Dizziness      HPI  Patient presents for evaluation of six-month history of intermittent episodes of nausea and dizziness. As a multiple physician evaluations and states "they don't know anything". She presents here today and is crying. She states that 6 months ago her vitamin D level was low and despite supplement was still low 6 months ago. Intermittent episodes of dizziness described as difficulty with balance. States that this was worse yesterday at the grocery store. Today was nauseated and weak and presents here.    Past Medical History  Diagnosis Date  . Diverticulitis   . Cervical spinal stenosis   . Hypertension   . Hypercholesteremia   . Arthritis   . Complication of anesthesia     "makes me cry for days."  . Family history of anesthesia complication     Sister has nausea and vomitting.   Past Surgical History  Procedure Laterality Date  . Thoracic fusion  1992  . Tumor removal      small intestine  . Anterior cervical decomp/discectomy fusion  12/30/11  . Abdominal hysterectomy  1995  . Colectomy  2004    "lost 1/3 of my colon"  . Back surgery  1992    Family History  Problem Relation Age of Onset  . Anesthesia problems Sister    Social History  Substance Use Topics  . Smoking status: Current Every Day Smoker -- 0.50 packs/day for 40 years    Types: Cigarettes  . Smokeless tobacco: Never Used  . Alcohol Use: No   OB History    No data available     Review of Systems  Constitutional: Negative for fever, chills, diaphoresis, appetite change and fatigue.  HENT: Negative for mouth sores, sore throat and trouble swallowing.   Eyes: Negative for visual disturbance.  Respiratory: Negative for cough, chest tightness, shortness of breath and wheezing.   Cardiovascular: Negative for  chest pain.  Gastrointestinal: Positive for nausea. Negative for vomiting, abdominal pain, diarrhea and abdominal distention.  Endocrine: Negative for polydipsia, polyphagia and polyuria.  Genitourinary: Negative for dysuria, frequency and hematuria.  Musculoskeletal: Negative for gait problem.  Skin: Negative for color change, pallor and rash.  Neurological: Positive for dizziness and weakness. Negative for syncope, light-headedness and headaches.  Hematological: Does not bruise/bleed easily.  Psychiatric/Behavioral: Negative for behavioral problems and confusion.      Allergies  Review of patient's allergies indicates no known allergies.  Home Medications   Prior to Admission medications   Medication Sig Start Date End Date Taking? Authorizing Provider  ALPRAZolam Duanne Moron) 0.5 MG tablet Take 1 tablet by mouth every 6 (six) hours as needed (patient reports she takes one a week). anxiety 02/20/15  Yes Historical Provider, MD  Ascorbic Acid (VITAMIN C PO) Take 1 tablet by mouth 3 (three) times a week.    Yes Historical Provider, MD  ferrous sulfate 325 (65 FE) MG tablet Take 325 mg by mouth 3 (three) times a week.   Yes Historical Provider, MD  GLUCOSAMINE PO Take 1 tablet by mouth 3 (three) times a week.   Yes Historical Provider, MD  hydrochlorothiazide (HYDRODIURIL) 12.5 MG tablet Take 6.25 mg by mouth daily. 09/29/15  Yes Historical Provider, MD  HYDROcodone-acetaminophen (NORCO) 5-325 MG tablet Take 1-2 tablets by  mouth every 6 (six) hours as needed. Patient taking differently: Take 1-2 tablets by mouth every 6 (six) hours as needed for moderate pain.  08/03/15  Yes Veryl Speak, MD  Multiple Vitamins-Minerals (MULTIVITAMIN WITH MINERALS) tablet Take 1 tablet by mouth 3 (three) times a week.    Yes Historical Provider, MD  primidone (MYSOLINE) 50 MG tablet Take 1 tablet (50 mg total) by mouth at bedtime. 07/30/15  Yes Rebecca S Tat, DO  Probiotic Product (PROBIOTIC DAILY PO) Take 1  tablet by mouth every other day.   Yes Historical Provider, MD  valsartan (DIOVAN) 40 MG tablet Take 40 mg by mouth daily. 09/29/15  Yes Historical Provider, MD  Vitamin D, Ergocalciferol, (DRISDOL) 50000 UNITS CAPS capsule Take 1 capsule by mouth once a week. ON [facility] 03/27/15  Yes Historical Provider, MD  meclizine (ANTIVERT) 25 MG tablet Take 1 tablet (25 mg total) by mouth 3 (three) times daily as needed. 12/02/15   Tanna Furry, MD  ondansetron (ZOFRAN ODT) 4 MG disintegrating tablet Take 1 tablet (4 mg total) by mouth every 8 (eight) hours as needed for nausea. 12/02/15   Tanna Furry, MD   BP 112/78 mmHg  Pulse 69  Temp(Src) 98.1 F (36.7 C) (Oral)  Resp 16  Ht 5\' 5"  (1.651 m)  Wt 205 lb (92.987 kg)  BMI 34.11 kg/m2  SpO2 98% Physical Exam  Constitutional: She is oriented to person, place, and time. She appears well-developed and well-nourished. No distress.  HENT:  Head: Normocephalic.  Eyes: Conjunctivae are normal. Pupils are equal, round, and reactive to light. No scleral icterus.  Neck: Normal range of motion. Neck supple. No thyromegaly present.  Cardiovascular: Normal rate and regular rhythm.  Exam reveals no gallop and no friction rub.   No murmur heard. Pulmonary/Chest: Effort normal and breath sounds normal. No respiratory distress. She has no wheezes. She has no rales.  Abdominal: Soft. Bowel sounds are normal. She exhibits no distension. There is no tenderness. There is no rebound.  Musculoskeletal: Normal range of motion.  Neurological: She is alert and oriented to person, place, and time.  Skin: Skin is warm and dry. No rash noted.  Psychiatric: She has a normal mood and affect. Her behavior is normal.    ED Course  Procedures (including critical care time) Labs Review Labs Reviewed  CBC WITH DIFFERENTIAL/PLATELET - Abnormal; Notable for the following:    Hemoglobin 15.8 (*)    MCHC 36.1 (*)    All other components within normal limits  COMPREHENSIVE METABOLIC  PANEL - Abnormal; Notable for the following:    Total Protein 9.0 (*)    All other components within normal limits  URINALYSIS, ROUTINE W REFLEX MICROSCOPIC (NOT AT [facility]) - Abnormal; Notable for the following:    Hgb urine dipstick TRACE (*)    All other components within normal limits  URINE MICROSCOPIC-ADD ON - Abnormal; Notable for the following:    Squamous Epithelial / LPF 0-5 (*)    All other components within normal limits  MAGNESIUM    Imaging Review No results found. I have personally reviewed and evaluated these images and lab results as part of my medical decision-making.   EKG Interpretation None      MDM   Final diagnoses:  Vertigo  Nausea    Patient has a lot of concern and anxiety about some chronic medical illnesses but no apparent acute condition today. Is appropriate for discharge home. Does feel better after fluids, Antivert, Zofran. Will follow  up with her primary care physician.    Tanna Furry, MD 12/02/15 [phone]

## 2015-12-02 NOTE — Discharge Instructions (Signed)
Benign Positional Vertigo °Vertigo is the feeling that you or your surroundings are moving when they are not. Benign positional vertigo is the most common form of vertigo. The cause of this condition is not serious (is benign). This condition is triggered by certain movements and positions (is positional). This condition can be dangerous if it occurs while you are doing something that could endanger you or others, such as driving.  °CAUSES °In many cases, the cause of this condition is not known. It may be caused by a disturbance in an area of the inner ear that helps your brain to sense movement and balance. This disturbance can be caused by a viral infection (labyrinthitis), head injury, or repetitive motion. °RISK FACTORS °This condition is more likely to develop in: °· Women. °· People who are 50 years of age or older. °SYMPTOMS °Symptoms of this condition usually happen when you move your head or your eyes in different directions. Symptoms may start suddenly, and they usually last for less than a minute. Symptoms may include: °· Loss of balance and falling. °· Feeling like you are spinning or moving. °· Feeling like your surroundings are spinning or moving. °· Nausea and vomiting. °· Blurred vision. °· Dizziness. °· Involuntary eye movement (nystagmus). °Symptoms can be mild and cause only slight annoyance, or they can be severe and interfere with daily life. Episodes of benign positional vertigo may return (recur) over time, and they may be triggered by certain movements. Symptoms may improve over time. °DIAGNOSIS °This condition is usually diagnosed by medical history and a physical exam of the head, neck, and ears. You may be referred to a health care provider who specializes in ear, nose, and throat (ENT) problems (otolaryngologist) or a provider who specializes in disorders of the nervous system (neurologist). You may have additional testing, including: °· MRI. °· A CT scan. °· Eye movement tests. Your  health care provider may ask you to change positions quickly while he or she watches you for symptoms of benign positional vertigo, such as nystagmus. Eye movement may be tested with an electronystagmogram (ENG), caloric stimulation, the Dix-Hallpike test, or the roll test. °· An electroencephalogram (EEG). This records electrical activity in your brain. °· Hearing tests. °TREATMENT °Usually, your health care provider will treat this by moving your head in specific positions to adjust your inner ear back to normal. Surgery may be needed in severe cases, but this is rare. In some cases, benign positional vertigo may resolve on its own in 2-4 weeks. °HOME CARE INSTRUCTIONS °Safety °· Move slowly. Avoid sudden body or head movements. °· Avoid driving. °· Avoid operating heavy machinery. °· Avoid doing any tasks that would be dangerous to you or others if a vertigo episode would occur. °· If you have trouble walking or keeping your balance, try using a cane for stability. If you feel dizzy or unstable, sit down right away. °· Return to your normal activities as told by your health care provider. Ask your health care provider what activities are safe for you. °General Instructions °· Take over-the-counter and prescription medicines only as told by your health care provider. °· Avoid certain positions or movements as told by your health care provider. °· Drink enough fluid to keep your urine clear or pale yellow. °· Keep all follow-up visits as told by your health care provider. This is important. °SEEK MEDICAL CARE IF: °· You have a fever. °· Your condition gets worse or you develop new symptoms. °· Your family or friends   notice any behavioral changes.  Your nausea or vomiting gets worse.  You have numbness or a "pins and needles" sensation. SEEK IMMEDIATE MEDICAL CARE IF:  You have difficulty speaking or moving.  You are always dizzy.  You faint.  You develop severe headaches.  You have weakness in your  legs or arms.  You have changes in your hearing or vision.  You develop a stiff neck.  You develop sensitivity to light.   This information is not intended to replace advice given to you by your health care provider. Make sure you discuss any questions you have with your health care provider.   Document Released: 05/10/2006 Document Revised: 04/23/2015 Document Reviewed: 11/25/2014 Elsevier Interactive Patient Education 2016 [employer]  Nausea and Vomiting Nausea means you feel sick to your stomach. Throwing up (vomiting) is a reflex where stomach contents come out of your mouth. HOME CARE   Take medicine as told by your doctor.  Do not force yourself to eat. However, you do need to drink fluids.  If you feel like eating, eat a normal diet as told by your doctor.  Eat rice, wheat, potatoes, bread, lean meats, yogurt, fruits, and vegetables.  Avoid high-fat foods.  Drink enough fluids to keep your pee (urine) clear or pale yellow.  Ask your doctor how to replace body fluid losses (rehydrate). Signs of body fluid loss (dehydration) include:  Feeling very thirsty.  Dry lips and mouth.  Feeling dizzy.  Dark pee.  Peeing less than normal.  Feeling confused.  Fast breathing or heart rate. GET HELP RIGHT AWAY IF:   You have blood in your throw up.  You have black or bloody poop (stool).  You have a bad headache or stiff neck.  You feel confused.  You have bad belly (abdominal) pain.  You have chest pain or trouble breathing.  You do not pee at least once every 8 hours.  You have cold, clammy skin.  You keep throwing up after 24 to 48 hours.  You have a fever. MAKE SURE YOU:   Understand these instructions.  Will watch your condition.  Will get help right away if you are not doing well or get worse.   This information is not intended to replace advice given to you by your health care provider. Make sure you discuss any questions you have with your  health care provider.   Document Released: 01/19/2008 Document Revised: 10/25/2011 Document Reviewed: 01/01/2011 Elsevier Interactive Patient Education [employer].

## 2015-12-08 ENCOUNTER — Ambulatory Visit
Admission: RE | Admit: 2015-12-08 | Discharge: 2015-12-08 | Disposition: A | Discharge status: Home / Self Care | Payer: Medicare Other | Source: Ambulatory Visit | Attending: Physician Assistant | Admitting: Physician Assistant

## 2015-12-08 ENCOUNTER — Other Ambulatory Visit: Payer: Self-pay | Admitting: Physician Assistant

## 2015-12-08 DIAGNOSIS — R0602 Shortness of breath: Secondary | ICD-10-CM

## 2015-12-08 DIAGNOSIS — R531 Weakness: Secondary | ICD-10-CM

## 2015-12-08 DIAGNOSIS — F17200 Nicotine dependence, unspecified, uncomplicated: Secondary | ICD-10-CM

## 2015-12-08 DIAGNOSIS — R42 Dizziness and giddiness: Secondary | ICD-10-CM | POA: Diagnosis not present

## 2015-12-08 DIAGNOSIS — R110 Nausea: Secondary | ICD-10-CM | POA: Diagnosis not present

## 2015-12-08 DIAGNOSIS — Z720 Tobacco use: Secondary | ICD-10-CM | POA: Diagnosis not present

## 2015-12-10 ENCOUNTER — Other Ambulatory Visit (HOSPITAL_COMMUNITY): Payer: Self-pay | Admitting: Family Medicine

## 2015-12-10 ENCOUNTER — Other Ambulatory Visit: Payer: Self-pay | Admitting: Family Medicine

## 2015-12-10 DIAGNOSIS — R9389 Abnormal findings on diagnostic imaging of other specified body structures: Secondary | ICD-10-CM

## 2015-12-11 ENCOUNTER — Ambulatory Visit (HOSPITAL_COMMUNITY): Payer: Medicare Other

## 2015-12-17 ENCOUNTER — Ambulatory Visit (HOSPITAL_COMMUNITY)
Admission: RE | Admit: 2015-12-17 | Discharge: 2015-12-17 | Disposition: A | Discharge status: Home / Self Care | Payer: Medicare Other | Source: Ambulatory Visit | Attending: Family Medicine | Admitting: Family Medicine

## 2015-12-17 ENCOUNTER — Encounter (HOSPITAL_COMMUNITY): Payer: Self-pay

## 2015-12-17 DIAGNOSIS — K760 Fatty (change of) liver, not elsewhere classified: Secondary | ICD-10-CM | POA: Diagnosis not present

## 2015-12-17 DIAGNOSIS — R918 Other nonspecific abnormal finding of lung field: Secondary | ICD-10-CM | POA: Insufficient documentation

## 2015-12-17 DIAGNOSIS — E278 Other specified disorders of adrenal gland: Secondary | ICD-10-CM | POA: Insufficient documentation

## 2015-12-17 DIAGNOSIS — R531 Weakness: Secondary | ICD-10-CM | POA: Diagnosis not present

## 2015-12-17 DIAGNOSIS — R5383 Other fatigue: Secondary | ICD-10-CM | POA: Diagnosis not present

## 2015-12-17 DIAGNOSIS — R9389 Abnormal findings on diagnostic imaging of other specified body structures: Secondary | ICD-10-CM

## 2015-12-17 DIAGNOSIS — R0600 Dyspnea, unspecified: Secondary | ICD-10-CM | POA: Insufficient documentation

## 2015-12-17 MED ORDER — IOPAMIDOL (ISOVUE-300) INJECTION 61%
75.0000 mL | Freq: Once | INTRAVENOUS | Status: AC | PRN
  Administered 2015-12-17: 75 mL via INTRAVENOUS

## 2015-12-23 DIAGNOSIS — R42 Dizziness and giddiness: Secondary | ICD-10-CM | POA: Diagnosis not present

## 2015-12-23 DIAGNOSIS — F419 Anxiety disorder, unspecified: Secondary | ICD-10-CM | POA: Diagnosis not present

## 2015-12-23 DIAGNOSIS — R531 Weakness: Secondary | ICD-10-CM | POA: Diagnosis not present

## 2015-12-26 ENCOUNTER — Telehealth: Payer: Self-pay | Admitting: Neurology

## 2015-12-26 NOTE — Telephone Encounter (Signed)
VM-PT left message in regards to her Primidone and said she needs to come off it/Dawn CB# [phone]

## 2015-12-26 NOTE — Telephone Encounter (Signed)
Spoke with patient- she states she has been nauseated, weak, and having dizziness for awhile now and she thinks it is from the medication. She states she can hardly get out of bed. Made her aware this doesn't sound like side effects of medication and she should see primary care physician re: symptoms, but she can go ahead and stop medication. She will do this and call if needed.

## 2016-01-02 ENCOUNTER — Encounter: Payer: Self-pay | Admitting: Cardiology

## 2016-01-02 ENCOUNTER — Ambulatory Visit (INDEPENDENT_AMBULATORY_CARE_PROVIDER_SITE_OTHER): Payer: Medicare Other | Admitting: Cardiology

## 2016-01-02 VITALS — BP 124/78 | HR 66 | Ht 65.0 in | Wt 214.0 lb

## 2016-01-02 DIAGNOSIS — R5382 Chronic fatigue, unspecified: Secondary | ICD-10-CM | POA: Diagnosis not present

## 2016-01-02 DIAGNOSIS — M5022 Other cervical disc displacement, mid-cervical region, unspecified level: Secondary | ICD-10-CM | POA: Insufficient documentation

## 2016-01-02 DIAGNOSIS — E559 Vitamin D deficiency, unspecified: Secondary | ICD-10-CM | POA: Insufficient documentation

## 2016-01-02 DIAGNOSIS — M542 Cervicalgia: Secondary | ICD-10-CM | POA: Insufficient documentation

## 2016-01-02 DIAGNOSIS — R251 Tremor, unspecified: Secondary | ICD-10-CM | POA: Insufficient documentation

## 2016-01-02 DIAGNOSIS — F17210 Nicotine dependence, cigarettes, uncomplicated: Secondary | ICD-10-CM | POA: Insufficient documentation

## 2016-01-02 DIAGNOSIS — F419 Anxiety disorder, unspecified: Secondary | ICD-10-CM | POA: Insufficient documentation

## 2016-01-02 DIAGNOSIS — R739 Hyperglycemia, unspecified: Secondary | ICD-10-CM | POA: Insufficient documentation

## 2016-01-02 DIAGNOSIS — E785 Hyperlipidemia, unspecified: Secondary | ICD-10-CM | POA: Diagnosis not present

## 2016-01-02 DIAGNOSIS — E7800 Pure hypercholesterolemia, unspecified: Secondary | ICD-10-CM | POA: Insufficient documentation

## 2016-01-02 DIAGNOSIS — I10 Essential (primary) hypertension: Secondary | ICD-10-CM | POA: Insufficient documentation

## 2016-01-02 DIAGNOSIS — M150 Primary generalized (osteo)arthritis: Secondary | ICD-10-CM | POA: Insufficient documentation

## 2016-01-02 DIAGNOSIS — N951 Menopausal and female climacteric states: Secondary | ICD-10-CM | POA: Insufficient documentation

## 2016-01-02 DIAGNOSIS — D72829 Elevated white blood cell count, unspecified: Secondary | ICD-10-CM | POA: Insufficient documentation

## 2016-01-02 DIAGNOSIS — M5412 Radiculopathy, cervical region: Secondary | ICD-10-CM | POA: Insufficient documentation

## 2016-01-02 DIAGNOSIS — R531 Weakness: Secondary | ICD-10-CM

## 2016-01-02 DIAGNOSIS — R5383 Other fatigue: Secondary | ICD-10-CM | POA: Insufficient documentation

## 2016-01-02 DIAGNOSIS — M129 Arthropathy, unspecified: Secondary | ICD-10-CM | POA: Insufficient documentation

## 2016-01-02 DIAGNOSIS — G250 Essential tremor: Secondary | ICD-10-CM | POA: Insufficient documentation

## 2016-01-02 LAB — T3, FREE: T3, Free: 2.8 pg/mL (ref 2.3–4.2)

## 2016-01-02 LAB — T4, FREE: Free T4: 1.1 ng/dL (ref 0.8–1.8)

## 2016-01-02 LAB — TSH: TSH: 3.15 mIU/L

## 2016-01-02 NOTE — Progress Notes (Signed)
Cardiology Office Note    Date:  01/02/2016   ID:  Jennifer, [name] 10/03/1950, MRN [id number]  PCP And referring physician:  Osborne Casco, MD  Cardiologist:   Jennifer Dawley, MD   Chief complaint: Weakness, fatigue  History of Present Illness:  [name] is a 65 y.o. female with prior medical history of hypertension, hyperlipidemia prior surgery to cervical spine for spinal stenosis with decompression and spinal fusion in 2013 with residual weakness of her right upper extremity and profound fatigue. Her fatigue and weakness progressed over the last 3 years to the point where she is hard time walking she feels just exhausted all the time and has hard time getting out of bed. She used to attend [employer] classes but for the last month she wasn't even able to finish to class and eventually stopped going. While she was exercising she didn't feel any shortness of breath or chest pain she just felt like she has no energy. Family history includes coronary artery disease and stent placement in the 82s in her father was also smoker. Patient's denies lower extremity edema, palpitations, syncope, orthopnea or proximal nocturnal dyspnea.  Past Medical History  Diagnosis Date  . Diverticulitis   . Cervical spinal stenosis   . Hypertension   . Hypercholesteremia   . Arthritis   . Complication of anesthesia     "makes me cry for days."  . Family history of anesthesia complication     Sister has nausea and vomitting.    Past Surgical History  Procedure Laterality Date  . Thoracic fusion  1992  . Tumor removal      small intestine  . Anterior cervical decomp/discectomy fusion  12/30/11  . Abdominal hysterectomy  1995  . Colectomy  2004    "lost 1/3 of my colon"  . Back surgery  1992     Current Medications: Outpatient Prescriptions Prior to Visit  Medication Sig Dispense Refill  . ALPRAZolam (XANAX) 0.5 MG tablet Take 1 tablet by mouth every 6 (six) hours as needed  (patient reports she takes one a week). anxiety  0  . ferrous sulfate 325 (65 FE) MG tablet Take 325 mg by mouth 3 (three) times a week.    Marland Kitchen GLUCOSAMINE PO Take 1 tablet by mouth 3 (three) times a week.    . hydrochlorothiazide (HYDRODIURIL) 12.5 MG tablet Take 6.25 mg by mouth daily.  0  . Multiple Vitamins-Minerals (MULTIVITAMIN WITH MINERALS) tablet Take 1 tablet by mouth 3 (three) times a week.     . Probiotic Product (PROBIOTIC DAILY PO) Take 1 tablet by mouth every other day.    . valsartan (DIOVAN) 40 MG tablet Take 40 mg by mouth daily.  0  . Vitamin D, Ergocalciferol, (DRISDOL) 50000 UNITS CAPS capsule Take 1 capsule by mouth once a week. ON WEDNESDAYS  0  . HYDROcodone-acetaminophen (NORCO) 5-325 MG tablet Take 1-2 tablets by mouth every 6 (six) hours as needed. (Patient not taking: Reported on 01/02/2016) 15 tablet 0  . Ascorbic Acid (VITAMIN C PO) Take 1 tablet by mouth 3 (three) times a week.     . meclizine (ANTIVERT) 25 MG tablet Take 1 tablet (25 mg total) by mouth 3 (three) times daily as needed. 20 tablet 0  . ondansetron (ZOFRAN ODT) 4 MG disintegrating tablet Take 1 tablet (4 mg total) by mouth every 8 (eight) hours as needed for nausea. 6 tablet 0  . primidone (MYSOLINE) 50 MG tablet Take 1 tablet (50  mg total) by mouth at bedtime. 30 tablet 5   No facility-administered medications prior to visit.     Allergies:   Lyrica; Primidone; Simvastatin; and Statins   Social History   Social History  . Marital Status: Divorced    Spouse Name: N/A  . Number of Children: N/A  . Years of Education: N/A   Occupational History  . retired     Press photographer, Teaching laboratory technician   Social History Main Topics  . Smoking status: Current Every Day Smoker -- 0.50 packs/day for 40 years    Types: Cigarettes  . Smokeless tobacco: Never Used  . Alcohol Use: No  . Drug Use: No  . Sexual Activity: No   Other Topics Concern  . None   Social History Narrative     Family History:  The patient's  family history includes Anesthesia problems in her sister.   ROS:   Please see the history of present illness.    ROS All other systems reviewed and are negative.   PHYSICAL EXAM:   VS:  BP 124/78 mmHg  Pulse 66  Ht 5\' 5"  (1.651 m)  Wt 214 lb (97.07 kg)  BMI 35.61 kg/m2   GEN: Well nourished, well developed, in no acute distress HEENT: normal Neck: no JVD, carotid bruits, or masses Cardiac: RRR; no murmurs, rubs, or gallops,no edema  Respiratory:  clear to auscultation bilaterally, normal work of breathing GI: soft, nontender, nondistended, + BS MS: no deformity or atrophy Skin: warm and dry, no rash Neuro:  Alert and Oriented x 3, Strength and sensation are intact Psych: euthymic mood, full affect  Wt Readings from Last 3 Encounters:  01/02/16 214 lb (97.07 kg)  12/02/15 205 lb (92.987 kg)  10/24/15 216 lb (97.977 kg)      Studies/Labs Reviewed:   EKG:  EKG is ordered today.  The ekg ordered today demonstrates Sinus rhythm, normal EKG.  Recent Labs: 02/05/2015: TSH 1.65 12/02/2015: ALT 36; BUN 15; Creatinine, Ser 0.79; Hemoglobin 15.8*; Magnesium 1.8; Platelets 265; Potassium 3.9; Sodium 139   Lipid Panel No results found for: CHOL, TRIG, HDL, CHOLHDL, VLDL, LDLCALC, LDLDIRECT      ASSESSMENT:    1. Weakness   2. Chronic fatigue   3. HLD (hyperlipidemia)      PLAN:  In order of problems listed above:  The patient's symptoms seems to be linked to her anterior cervical decompression and fusion with residual weakness that progressed over the last 3 years. To me this seems to be is a sign of spinal stenosis, however we have to rule out ischemia as a reason behind fatigue-like of energy and inability to perform any activities. We will schedule an echocardiogram to evaluate for systolic and diastolic function. We will also evaluate a Lexiscan nuclear stress test to evaluate for ischemia.  She has elevated LDL however considering her weakness of her legs to the point  where she is not able to drive herself and needed support for walking I would not decrease any muscle pain with statin as she previously didn't tolerate simvastatin and I will just wait for now. We will also check patient's TSH free T3 and free T4.  Medication Adjustments/Labs and Tests Ordered: Current medicines are reviewed at length with the patient today.  Concerns regarding medicines are outlined above.  Medication changes, Labs and Tests ordered today are listed in the Patient Instructions below. Patient Instructions  Medication Instructions:   Your physician recommends that you continue on your current medications as directed.  Please refer to the Current Medication list given to you today.   Labwork:  TODAY--TSH, FREE T 3, AND FREE T 4   Testing/Procedures:  Your physician has requested that you have an echocardiogram. Echocardiography is a painless test that uses sound waves to create images of your heart. It provides your doctor with information about the size and shape of your heart and how well your heart's chambers and valves are working. This procedure takes approximately one hour. There are no restrictions for this procedure.    Your physician has requested that you have a lexiscan myoview. For further information please visit [email or web address]. Please follow instruction sheet, as given.    Follow-Up:  AS NEEDED WITH DR Meda Coffee      If you need a refill on your cardiac medications before your next appointment, please call your pharmacy.       Signed, Jennifer Dawley, MD  01/02/2016 9:36 AM    [address], [place], [place]  [address] Phone: [phone]; Fax: [phone]

## 2016-01-02 NOTE — Patient Instructions (Signed)
Medication Instructions:   Your physician recommends that you continue on your current medications as directed. Please refer to the Current Medication list given to you today.   Labwork:  TODAY--TSH, FREE T 3, AND FREE T 4   Testing/Procedures:  Your physician has requested that you have an echocardiogram. Echocardiography is a painless test that uses sound waves to create images of your heart. It provides your doctor with information about the size and shape of your heart and how well your heart's chambers and valves are working. This procedure takes approximately one hour. There are no restrictions for this procedure.    Your physician has requested that you have a lexiscan myoview. For further information please visit [email or web address]. Please follow instruction sheet, as given.    Follow-Up:  AS NEEDED WITH DR Meda Coffee      If you need a refill on your cardiac medications before your next appointment, please call your pharmacy.

## 2016-01-05 ENCOUNTER — Telehealth: Payer: Self-pay | Admitting: Cardiology

## 2016-01-05 NOTE — Telephone Encounter (Signed)
New message ° ° ° ° °Returning a call to the nurse °

## 2016-01-05 NOTE — Telephone Encounter (Signed)
Notified the pt that per Dr Meda Coffee, all her thyroid tests were normal.  Pt verbalized understanding.

## 2016-01-14 ENCOUNTER — Other Ambulatory Visit: Payer: Self-pay | Admitting: Neurosurgery

## 2016-01-14 DIAGNOSIS — M542 Cervicalgia: Secondary | ICD-10-CM

## 2016-01-14 DIAGNOSIS — G250 Essential tremor: Secondary | ICD-10-CM | POA: Diagnosis not present

## 2016-01-14 DIAGNOSIS — Z0000 Encounter for general adult medical examination without abnormal findings: Secondary | ICD-10-CM | POA: Diagnosis not present

## 2016-01-14 DIAGNOSIS — E7800 Pure hypercholesterolemia, unspecified: Secondary | ICD-10-CM | POA: Diagnosis not present

## 2016-01-14 DIAGNOSIS — R251 Tremor, unspecified: Secondary | ICD-10-CM | POA: Diagnosis not present

## 2016-01-14 DIAGNOSIS — M150 Primary generalized (osteo)arthritis: Secondary | ICD-10-CM | POA: Diagnosis not present

## 2016-01-14 DIAGNOSIS — F419 Anxiety disorder, unspecified: Secondary | ICD-10-CM | POA: Diagnosis not present

## 2016-01-14 DIAGNOSIS — I10 Essential (primary) hypertension: Secondary | ICD-10-CM | POA: Diagnosis not present

## 2016-01-14 DIAGNOSIS — M47812 Spondylosis without myelopathy or radiculopathy, cervical region: Secondary | ICD-10-CM | POA: Diagnosis not present

## 2016-01-14 DIAGNOSIS — Z6835 Body mass index (BMI) 35.0-35.9, adult: Secondary | ICD-10-CM | POA: Diagnosis not present

## 2016-01-14 DIAGNOSIS — Z23 Encounter for immunization: Secondary | ICD-10-CM | POA: Diagnosis not present

## 2016-01-14 DIAGNOSIS — M5412 Radiculopathy, cervical region: Secondary | ICD-10-CM | POA: Diagnosis not present

## 2016-01-22 ENCOUNTER — Ambulatory Visit
Admission: RE | Admit: 2016-01-22 | Discharge: 2016-01-22 | Disposition: A | Discharge status: Home / Self Care | Payer: Medicare Other | Source: Ambulatory Visit | Attending: Neurosurgery | Admitting: Neurosurgery

## 2016-01-22 ENCOUNTER — Encounter (HOSPITAL_COMMUNITY): Payer: Medicare Other

## 2016-01-22 ENCOUNTER — Other Ambulatory Visit (HOSPITAL_COMMUNITY): Payer: Medicare Other

## 2016-01-22 DIAGNOSIS — M542 Cervicalgia: Secondary | ICD-10-CM

## 2016-01-22 DIAGNOSIS — M5021 Other cervical disc displacement,  high cervical region: Secondary | ICD-10-CM | POA: Diagnosis not present

## 2016-01-28 DIAGNOSIS — M5412 Radiculopathy, cervical region: Secondary | ICD-10-CM | POA: Diagnosis not present

## 2016-01-28 DIAGNOSIS — M542 Cervicalgia: Secondary | ICD-10-CM | POA: Diagnosis not present

## 2016-01-28 DIAGNOSIS — M47812 Spondylosis without myelopathy or radiculopathy, cervical region: Secondary | ICD-10-CM | POA: Diagnosis not present

## 2016-01-29 ENCOUNTER — Telehealth (HOSPITAL_COMMUNITY): Payer: Self-pay | Admitting: *Deleted

## 2016-01-29 NOTE — Telephone Encounter (Signed)
Patient given detailed instructions per Myocardial Perfusion Study Information Sheet for the test on 02/04/16 at 0945. Patient notified to arrive 15 minutes early and that it is imperative to arrive on time for appointment to keep from having the test rescheduled.  If you need to cancel or reschedule your appointment, please call the office within 24 hours of your appointment. Failure to do so may result in a cancellation of your appointment, and a $50 no show fee. Patient verbalized understanding.[name], [name]

## 2016-01-30 DIAGNOSIS — M47812 Spondylosis without myelopathy or radiculopathy, cervical region: Secondary | ICD-10-CM | POA: Diagnosis not present

## 2016-02-04 ENCOUNTER — Encounter (HOSPITAL_COMMUNITY): Payer: Medicare Other

## 2016-02-04 ENCOUNTER — Other Ambulatory Visit (HOSPITAL_COMMUNITY): Payer: Medicare Other

## 2016-02-16 DIAGNOSIS — M47812 Spondylosis without myelopathy or radiculopathy, cervical region: Secondary | ICD-10-CM | POA: Diagnosis not present

## 2016-02-16 DIAGNOSIS — R251 Tremor, unspecified: Secondary | ICD-10-CM | POA: Diagnosis not present

## 2016-02-16 DIAGNOSIS — M542 Cervicalgia: Secondary | ICD-10-CM | POA: Diagnosis not present

## 2016-03-15 DIAGNOSIS — M47812 Spondylosis without myelopathy or radiculopathy, cervical region: Secondary | ICD-10-CM | POA: Diagnosis not present

## 2016-03-15 DIAGNOSIS — M542 Cervicalgia: Secondary | ICD-10-CM | POA: Diagnosis not present

## 2016-03-31 ENCOUNTER — Telehealth (HOSPITAL_COMMUNITY): Payer: Self-pay | Admitting: *Deleted

## 2016-03-31 DIAGNOSIS — M542 Cervicalgia: Secondary | ICD-10-CM | POA: Diagnosis not present

## 2016-03-31 DIAGNOSIS — M47812 Spondylosis without myelopathy or radiculopathy, cervical region: Secondary | ICD-10-CM | POA: Diagnosis not present

## 2016-03-31 DIAGNOSIS — Z6834 Body mass index (BMI) 34.0-34.9, adult: Secondary | ICD-10-CM | POA: Diagnosis not present

## 2016-03-31 NOTE — Telephone Encounter (Signed)
Patient given detailed instructions per Myocardial Perfusion Study Information Sheet for the test on 04/07/16/ at 0745. Patient notified to arrive 15 minutes early and that it is imperative to arrive on time for appointment to keep from having the test rescheduled.  If you need to cancel or reschedule your appointment, please call the office within 24 hours of your appointment. Failure to do so may result in a cancellation of your appointment, and a $50 no show fee. Patient verbalized understanding.Surie Suchocki, [name]

## 2016-04-07 ENCOUNTER — Ambulatory Visit (HOSPITAL_COMMUNITY): Payer: Medicare Other | Attending: Cardiology

## 2016-04-07 ENCOUNTER — Encounter (INDEPENDENT_AMBULATORY_CARE_PROVIDER_SITE_OTHER): Payer: Self-pay

## 2016-04-07 DIAGNOSIS — R531 Weakness: Secondary | ICD-10-CM

## 2016-04-07 DIAGNOSIS — Z8249 Family history of ischemic heart disease and other diseases of the circulatory system: Secondary | ICD-10-CM | POA: Diagnosis not present

## 2016-04-07 DIAGNOSIS — I10 Essential (primary) hypertension: Secondary | ICD-10-CM | POA: Insufficient documentation

## 2016-04-07 DIAGNOSIS — R5382 Chronic fatigue, unspecified: Secondary | ICD-10-CM | POA: Diagnosis not present

## 2016-04-07 LAB — MYOCARDIAL PERFUSION IMAGING
LV dias vol: 79 mL (ref 46–106)
LV sys vol: 29 mL
Peak HR: 85 {beats}/min
RATE: 0.23
Rest HR: 60 {beats}/min
SDS: 2
SRS: 2
SSS: 4
TID: 1.14

## 2016-04-07 MED ORDER — TECHNETIUM TC 99M TETROFOSMIN IV KIT
10.5000 | PACK | Freq: Once | INTRAVENOUS | Status: AC | PRN
  Administered 2016-04-07: 11 via INTRAVENOUS
  Filled 2016-04-07: qty 11

## 2016-04-07 MED ORDER — REGADENOSON 0.4 MG/5ML IV SOLN
0.4000 mg | Freq: Once | INTRAVENOUS | Status: AC
  Administered 2016-04-07: 0.4 mg via INTRAVENOUS

## 2016-04-07 MED ORDER — TECHNETIUM TC 99M TETROFOSMIN IV KIT
31.8000 | PACK | Freq: Once | INTRAVENOUS | Status: AC | PRN
  Administered 2016-04-07: 31.8 via INTRAVENOUS
  Filled 2016-04-07: qty 32

## 2016-04-28 DIAGNOSIS — R197 Diarrhea, unspecified: Secondary | ICD-10-CM | POA: Diagnosis not present

## 2016-04-28 DIAGNOSIS — Z23 Encounter for immunization: Secondary | ICD-10-CM | POA: Diagnosis not present

## 2016-05-17 ENCOUNTER — Encounter (HOSPITAL_COMMUNITY): Payer: Self-pay | Admitting: Emergency Medicine

## 2016-05-17 ENCOUNTER — Emergency Department (HOSPITAL_COMMUNITY): Payer: Medicare Other

## 2016-05-17 ENCOUNTER — Emergency Department (HOSPITAL_COMMUNITY)
Admission: EM | Admit: 2016-05-17 | Discharge: 2016-05-17 | Disposition: A | Discharge status: Home / Self Care | Payer: Medicare Other | Attending: Emergency Medicine | Admitting: Emergency Medicine

## 2016-05-17 DIAGNOSIS — I10 Essential (primary) hypertension: Secondary | ICD-10-CM | POA: Insufficient documentation

## 2016-05-17 DIAGNOSIS — R1030 Lower abdominal pain, unspecified: Secondary | ICD-10-CM | POA: Diagnosis not present

## 2016-05-17 DIAGNOSIS — R1032 Left lower quadrant pain: Secondary | ICD-10-CM | POA: Insufficient documentation

## 2016-05-17 DIAGNOSIS — K5732 Diverticulitis of large intestine without perforation or abscess without bleeding: Secondary | ICD-10-CM | POA: Diagnosis not present

## 2016-05-17 DIAGNOSIS — R109 Unspecified abdominal pain: Secondary | ICD-10-CM

## 2016-05-17 DIAGNOSIS — F17210 Nicotine dependence, cigarettes, uncomplicated: Secondary | ICD-10-CM | POA: Diagnosis not present

## 2016-05-17 LAB — CBC
HCT: 41.6 % (ref 36.0–46.0)
Hemoglobin: 14.2 g/dL (ref 12.0–15.0)
MCH: 32.7 pg (ref 26.0–34.0)
MCHC: 34.1 g/dL (ref 30.0–36.0)
MCV: 95.9 fL (ref 78.0–100.0)
PLATELETS: 298 10*3/uL (ref 150–400)
RBC: 4.34 MIL/uL (ref 3.87–5.11)
RDW: 14.3 % (ref 11.5–15.5)
WBC: 9.8 10*3/uL (ref 4.0–10.5)

## 2016-05-17 LAB — URINALYSIS, ROUTINE W REFLEX MICROSCOPIC
Bilirubin Urine: NEGATIVE
Glucose, UA: NEGATIVE mg/dL
KETONES UR: NEGATIVE mg/dL
NITRITE: NEGATIVE
PROTEIN: NEGATIVE mg/dL
Specific Gravity, Urine: 1.011 (ref 1.005–1.030)
pH: 6 (ref 5.0–8.0)

## 2016-05-17 LAB — URINE MICROSCOPIC-ADD ON

## 2016-05-17 LAB — COMPREHENSIVE METABOLIC PANEL
ALBUMIN: 3.7 g/dL (ref 3.5–5.0)
ALT: 23 U/L (ref 14–54)
AST: 21 U/L (ref 15–41)
Alkaline Phosphatase: 38 U/L (ref 38–126)
Anion gap: 7 (ref 5–15)
BUN: 13 mg/dL (ref 6–20)
CALCIUM: 8.8 mg/dL — AB (ref 8.9–10.3)
CO2: 25 mmol/L (ref 22–32)
Chloride: 107 mmol/L (ref 101–111)
Creatinine, Ser: 0.73 mg/dL (ref 0.44–1.00)
GFR calc Af Amer: >60 mL/min (ref >60)
Glucose, Bld: 105 mg/dL — ABNORMAL HIGH (ref 65–99)
Potassium: 3.5 mmol/L (ref 3.5–5.1)
SODIUM: 139 mmol/L (ref 135–145)
TOTAL PROTEIN: 8 g/dL (ref 6.5–8.1)
Total Bilirubin: 1.1 mg/dL (ref 0.3–1.2)

## 2016-05-17 MED ORDER — SODIUM CHLORIDE 0.9 % IV BOLUS (SEPSIS)
1000.0000 mL | Freq: Once | INTRAVENOUS | Status: AC
  Administered 2016-05-17: 1000 mL via INTRAVENOUS

## 2016-05-17 MED ORDER — MORPHINE SULFATE (PF) 4 MG/ML IV SOLN: 4.0000 mg | Freq: Once | INTRAVENOUS | Status: DC

## 2016-05-17 MED ORDER — IOPAMIDOL (ISOVUE-300) INJECTION 61%
15.0000 mL | Freq: Once | INTRAVENOUS | Status: AC | PRN
  Administered 2016-05-17: 15 mL via ORAL

## 2016-05-17 MED ORDER — ONDANSETRON HCL 4 MG/2ML IJ SOLN: 4.0000 mg | Freq: Once | INTRAMUSCULAR | Status: DC

## 2016-05-17 MED ORDER — IOPAMIDOL (ISOVUE-300) INJECTION 61%
100.0000 mL | Freq: Once | INTRAVENOUS | Status: AC | PRN
  Administered 2016-05-17: 100 mL via INTRAVENOUS

## 2016-05-17 MED ORDER — SODIUM CHLORIDE 0.9 % IV BOLUS (SEPSIS)
500.0000 mL | Freq: Once | INTRAVENOUS | Status: AC
  Administered 2016-05-17: 500 mL via INTRAVENOUS

## 2016-05-17 NOTE — Discharge Instructions (Signed)
It was our pleasure to provide your ER care today - we hope that you feel better.  Rest. Drink plenty of fluids.  Take tylenol or advil as need.   Follow up with your primary care doctor in the next couple days for recheck if symptoms fail to improve/resolve.  Return to ER if worse, new symptoms, fevers, persistent vomiting, worsening or severe pain, severe diarrhea, other concern.   You were given pain medication in the ER - no driving for the next 4 hours.

## 2016-05-17 NOTE — ED Notes (Signed)
Patient presents to the ED from home where she lives alone with complaints of abdominal pain for 1 day. Patient denies pain currently, but states she feels uncomfortable.  Patient endorses diarrhea, but denies N/V. Patient denies fever and blood in her stool.  On exam, patient's lung sounds expiratory wheezing in all lobes, no respiratory distress.  Heart sounds, S1/S2.  No pre-tibial and pedal edema.  Patient's abdomen is tender to palpation, worse LLQ.  Bowel sounds present.  Patient is alert and oriented to person, place, time and events.  Skin warm/dry.

## 2016-05-17 NOTE — ED Provider Notes (Signed)
[facility] DEPT Provider Note   CSN: [id number] Arrival date & time: 05/17/16  0705     History   Chief Complaint Chief Complaint  Patient presents with  . LLQ pain    HPI [name] is a 65 y.o. female.  Patient c/o lower abdominal pain for the past few weeks. Patient indicates she thinks it was a flare of diverticulitis. States was on abx for 10 days in mid [place] and flagyl which she completed 9/23, but symptoms persist.  C/o left lower abd pain, constant, dull, persistent, non radiating. Patient also indicates has remote hx abdominal surgery, and that she thinks they clipped the nerves, and that may also be causing pain. Patient denies fevers, states thermometer dont work on her, and that her temperature is usually 98.6.  Patient notes a couple episodes of loose stool, but no severe diarrhea. No vomiting. +decreased appetite.  Patient states is concerned that her infection is not gone.       Past Medical History:  Diagnosis Date  . Arthritis   . Cervical spinal stenosis   . Complication of anesthesia    "makes me cry for days."  . Diverticulitis   . Family history of anesthesia complication    Sister has nausea and vomitting.  Marland Kitchen Hypercholesteremia   . Hypertension     Patient Active Problem List   Diagnosis Date Noted  . Vitamin D deficiency 01/02/2016  . Arthropathy 01/02/2016  . Tremor 01/02/2016  . Primary generalized (osteo)arthritis 01/02/2016  . Cigarette nicotine dependence, uncomplicated [id number]  . Female climacteric state 01/02/2016  . Hyperglycemia 01/02/2016  . Essential (primary) hypertension 01/02/2016  . Elevated white blood cell count 01/02/2016  . 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 Cervicalgia 01/02/2016  . Radiculopathy of cervical region 01/02/2016  . Essential tremor 01/02/2016  . Anxiety disorder 01/02/2016  . Pure hypercholesterolemia 01/02/2016  . Weakness 01/02/2016  . Fatigue  01/02/2016  . HLD (hyperlipidemia) 01/02/2016  . Body mass index (BMI) of 35.0-35.9 in adult 08/28/2014    Past Surgical History:  Procedure Laterality Date  . ABDOMINAL HYSTERECTOMY  1995  . ANTERIOR CERVICAL DECOMP/DISCECTOMY FUSION  12/30/11  . BACK SURGERY  1992   . COLECTOMY  2004   "lost 1/3 of my colon"  . THORACIC FUSION  1992  . TUMOR REMOVAL     small intestine    OB History    No data available       Home Medications    Prior to Admission medications   Medication Sig Start Date End Date Taking? Authorizing Provider  ALPRAZolam Duanne Moron) 0.5 MG tablet Take 1 tablet by mouth every 6 (six) hours as needed (patient reports she takes one a week). anxiety 02/20/15   Historical Provider, MD  ferrous sulfate 325 (65 FE) MG tablet Take 325 mg by mouth 3 (three) times a week.    Historical Provider, MD  GLUCOSAMINE PO Take 1 tablet by mouth 3 (three) times a week.    Historical Provider, MD  hydrochlorothiazide (HYDRODIURIL) 12.5 MG tablet Take 6.25 mg by mouth daily. 09/29/15   Historical Provider, MD  HYDROcodone-acetaminophen (NORCO) 5-325 MG tablet Take 1-2 tablets by mouth every 6 (six) hours as needed. Patient not taking: Reported on 01/02/2016 08/03/15   Veryl Speak, MD  Multiple Vitamins-Minerals (MULTIVITAMIN WITH MINERALS) tablet Take 1 tablet by mouth 3 (three) times a week.     Historical Provider, MD  Probiotic Product (PROBIOTIC DAILY PO) Take 1 tablet  by mouth every other day.    Historical Provider, MD  valsartan (DIOVAN) 40 MG tablet Take 40 mg by mouth daily. 09/29/15   Historical Provider, MD  Vitamin D, Ergocalciferol, (DRISDOL) 50000 UNITS CAPS capsule Take 1 capsule by mouth once a week. ON [facility] 03/27/15   Historical Provider, MD    Family History Family History  Problem Relation Age of Onset  . Anesthesia problems Sister     Social History Social History  Substance Use Topics  . Smoking status: Current Every Day Smoker    Packs/day: 0.50     Years: 40.00    Types: Cigarettes  . Smokeless tobacco: Never Used  . Alcohol use No     Allergies   Lyrica [pregabalin]; Primidone; Simvastatin; and Statins   Review of Systems Review of Systems  Constitutional: Negative for chills and fever.  HENT: Negative for sore throat.   Eyes: Negative for redness.  Respiratory: Negative for shortness of breath.   Cardiovascular: Negative for chest pain.  Gastrointestinal: Positive for abdominal pain and diarrhea.  Genitourinary: Negative for dysuria and flank pain.  Musculoskeletal: Negative for back pain and neck pain.  Skin: Negative for rash.  Neurological: Negative for headaches.  Hematological: Does not bruise/bleed easily.  Psychiatric/Behavioral: Negative for confusion.     Physical Exam Updated Vital Signs BP 129/86 (BP Location: Right Arm)   Pulse 71   Temp 98.6 F (37 C) (Oral)   Resp 19   Ht 5\' 5"  (1.651 m)   Wt 93 kg   SpO2 98%   BMI 34.11 kg/m   Physical Exam  Constitutional: She appears well-developed and well-nourished. No distress.  HENT:  Mouth/Throat: Oropharynx is clear and moist.  Eyes: Conjunctivae are normal. No scleral icterus.  Neck: Neck supple. No tracheal deviation present.  Cardiovascular: Normal rate, regular rhythm, normal heart sounds and intact distal pulses.   No murmur heard. Pulmonary/Chest: Effort normal. No respiratory distress.  Abdominal: Soft. Normal appearance and bowel sounds are normal. She exhibits no distension and no mass. There is tenderness. There is no rebound and no guarding. No hernia.  Lower abd tenderness.   Genitourinary:  Genitourinary Comments: No cva tenderness  Musculoskeletal: She exhibits no edema.  Neurological: She is alert.  Skin: Skin is warm and dry. No rash noted. She is not diaphoretic.  Psychiatric: She has a normal mood and affect.  Nursing note and vitals reviewed.    ED Treatments / Results  Labs (all labs ordered are listed, but only  abnormal results are displayed) Results for orders placed or performed during the hospital encounter of 05/17/16  CBC  Result Value Ref Range   WBC 9.8 4.0 - 10.5 K/uL   RBC 4.34 3.87 - 5.11 MIL/uL   Hemoglobin 14.2 12.0 - 15.0 g/dL   HCT 41.6 36.0 - 46.0 %   MCV 95.9 78.0 - 100.0 fL   MCH 32.7 26.0 - 34.0 pg   MCHC 34.1 30.0 - 36.0 g/dL   RDW 14.3 11.5 - 15.5 %   Platelets 298 150 - 400 K/uL  Comprehensive metabolic panel  Result Value Ref Range   Sodium 139 135 - 145 mmol/L   Potassium 3.5 3.5 - 5.1 mmol/L   Chloride 107 101 - 111 mmol/L   CO2 25 22 - 32 mmol/L   Glucose, Bld 105 (H) 65 - 99 mg/dL   BUN 13 6 - 20 mg/dL   Creatinine, Ser 0.73 0.44 - 1.00 mg/dL   Calcium 8.8 (L)  8.9 - 10.3 mg/dL   Total Protein 8.0 6.5 - 8.1 g/dL   Albumin 3.7 3.5 - 5.0 g/dL   AST 21 15 - 41 U/L   ALT 23 14 - 54 U/L   Alkaline Phosphatase 38 38 - 126 U/L   Total Bilirubin 1.1 0.3 - 1.2 mg/dL   GFR calc non Af Amer >60 >60 mL/min   GFR calc Af Amer >60 >60 mL/min   Anion gap 7 5 - 15  Urinalysis, Routine w reflex microscopic (not at [facility])  Result Value Ref Range   Color, Urine YELLOW YELLOW   APPearance CLOUDY (A) CLEAR   Specific Gravity, Urine 1.011 1.005 - 1.030   pH 6.0 5.0 - 8.0   Glucose, UA NEGATIVE NEGATIVE mg/dL   Hgb urine dipstick TRACE (A) NEGATIVE   Bilirubin Urine NEGATIVE NEGATIVE   Ketones, ur NEGATIVE NEGATIVE mg/dL   Protein, ur NEGATIVE NEGATIVE mg/dL   Nitrite NEGATIVE NEGATIVE   Leukocytes, UA TRACE (A) NEGATIVE  Urine microscopic-add on  Result Value Ref Range   Squamous Epithelial / LPF 6-30 (A) NONE SEEN   WBC, UA 6-30 0 - 5 WBC/hpf   RBC / HPF 0-5 0 - 5 RBC/hpf   Bacteria, UA FEW (A) NONE SEEN   Ct Abdomen Pelvis W Contrast  Result Date: 05/17/2016 CLINICAL DATA:  Diverticulitis with left lower quadrant pain and diarrhea EXAM: CT ABDOMEN AND PELVIS WITH CONTRAST TECHNIQUE: Multidetector CT imaging of the abdomen and pelvis was performed using the standard  protocol following bolus administration of intravenous contrast. CONTRAST:  25mL ISOVUE-300 IOPAMIDOL (ISOVUE-300) INJECTION 61%, 173mL ISOVUE-300 IOPAMIDOL (ISOVUE-300) INJECTION 61% COMPARISON:  06/11/2014 FINDINGS: Lower chest: No acute abnormality. Hepatobiliary: The liver is diffusely decreased in attenuation consistent with fatty infiltration. The gallbladder is well distended without focal abnormality. Pancreas: Unremarkable. No pancreatic ductal dilatation or surrounding inflammatory changes. Spleen: Normal in size without focal abnormality. Adrenals/Urinary Tract: Mild hypertrophy of the adrenal glands is noted bilaterally. No focal mass lesion is seen. The kidneys demonstrate cystic change on the left. No calculi or obstructive changes are seen. The bladder is partially decompressed. Stomach/Bowel: Diffuse diverticulosis of the colon is noted. There are changes consistent with prior partial colectomy. The appendix is within normal limits. No inflammatory changes to suggest diverticulitis are seen. Vascular/Lymphatic: Aortic atherosclerosis. No enlarged abdominal or pelvic lymph nodes. Reproductive: Status post hysterectomy. No adnexal masses. Other: Anterior abdominal wall hernia is again identified at the level of the umbilicus with small bowel within. No incarceration is noted. A smaller anterior abdominal wall hernia is noted E centric to the right. Small moped bowel is noted within although again no incarceration is seen. Musculoskeletal: Degenerative changes of lumbar spine are noted. No acute compression deformity is seen. IMPRESSION: Diverticulosis without diverticulitis. Chronic changes as described above. Electronically Signed   By: Inez Catalina M.D.   On: 05/17/2016 09:43     EKG  EKG Interpretation None        Procedures Procedures (including critical care time)  Medications Ordered in ED Medications - No data to display   Initial Impression / Assessment and Plan / ED Course    I have reviewed the triage vital signs and the nursing notes.  Pertinent labs & imaging results that were available during my care of the patient were reviewed by me and considered in my medical decision making (see chart for details).  Clinical Course    Iv ns. Labs.   Reviewed nursing notes and prior  charts for additional history.   Ns bolus.  No episodes of emesis or diarrhea in ED.    Ct negative acute.  Po fluids, pt tolerates.   Patient denies dysuria or gu c/o. No cva tenderness.   Patient currently appears stable for d/c.      Final Clinical Impressions(s) / ED Diagnoses   Final diagnoses:  None    New Prescriptions New Prescriptions   No medications on file     Lajean Saver, MD 05/17/16 1023

## 2016-05-17 NOTE — ED Notes (Signed)
Ginger ale given

## 2016-05-17 NOTE — ED Triage Notes (Signed)
Patient states that she is having a diverticulitis flare up since mid September.  Patient states pain is in LLQ pain, chills and "almost diarrhea".  Patient went to PCP in mid September and was given antibiotics for 10 days.

## 2016-05-19 DIAGNOSIS — M47812 Spondylosis without myelopathy or radiculopathy, cervical region: Secondary | ICD-10-CM | POA: Diagnosis not present

## 2016-05-19 DIAGNOSIS — R251 Tremor, unspecified: Secondary | ICD-10-CM | POA: Diagnosis not present

## 2016-05-19 DIAGNOSIS — M129 Arthropathy, unspecified: Secondary | ICD-10-CM | POA: Diagnosis not present

## 2016-05-19 DIAGNOSIS — I10 Essential (primary) hypertension: Secondary | ICD-10-CM | POA: Diagnosis not present

## 2016-05-19 DIAGNOSIS — M5412 Radiculopathy, cervical region: Secondary | ICD-10-CM | POA: Diagnosis not present

## 2016-05-19 DIAGNOSIS — M542 Cervicalgia: Secondary | ICD-10-CM | POA: Diagnosis not present

## 2016-05-19 DIAGNOSIS — Z6835 Body mass index (BMI) 35.0-35.9, adult: Secondary | ICD-10-CM | POA: Diagnosis not present

## 2016-05-20 DIAGNOSIS — K644 Residual hemorrhoidal skin tags: Secondary | ICD-10-CM | POA: Diagnosis not present

## 2016-05-20 DIAGNOSIS — K625 Hemorrhage of anus and rectum: Secondary | ICD-10-CM | POA: Diagnosis not present

## 2016-05-20 DIAGNOSIS — R1032 Left lower quadrant pain: Secondary | ICD-10-CM | POA: Diagnosis not present

## 2016-05-20 DIAGNOSIS — K5730 Diverticulosis of large intestine without perforation or abscess without bleeding: Secondary | ICD-10-CM | POA: Diagnosis not present

## 2016-06-07 DIAGNOSIS — K5900 Constipation, unspecified: Secondary | ICD-10-CM | POA: Diagnosis not present

## 2016-07-16 DIAGNOSIS — K5900 Constipation, unspecified: Secondary | ICD-10-CM | POA: Diagnosis not present

## 2016-07-16 DIAGNOSIS — I10 Essential (primary) hypertension: Secondary | ICD-10-CM | POA: Diagnosis not present

## 2016-07-16 DIAGNOSIS — E7800 Pure hypercholesterolemia, unspecified: Secondary | ICD-10-CM | POA: Diagnosis not present

## 2016-07-16 DIAGNOSIS — E559 Vitamin D deficiency, unspecified: Secondary | ICD-10-CM | POA: Diagnosis not present

## 2016-07-16 DIAGNOSIS — R739 Hyperglycemia, unspecified: Secondary | ICD-10-CM | POA: Diagnosis not present

## 2016-09-16 DIAGNOSIS — R109 Unspecified abdominal pain: Secondary | ICD-10-CM | POA: Diagnosis not present

## 2016-09-20 DIAGNOSIS — R1032 Left lower quadrant pain: Secondary | ICD-10-CM | POA: Diagnosis not present

## 2016-09-20 DIAGNOSIS — K625 Hemorrhage of anus and rectum: Secondary | ICD-10-CM | POA: Diagnosis not present

## 2016-10-08 DIAGNOSIS — K649 Unspecified hemorrhoids: Secondary | ICD-10-CM | POA: Diagnosis not present

## 2016-10-08 DIAGNOSIS — K5900 Constipation, unspecified: Secondary | ICD-10-CM | POA: Diagnosis not present

## 2016-12-13 DIAGNOSIS — M542 Cervicalgia: Secondary | ICD-10-CM | POA: Diagnosis not present

## 2016-12-13 DIAGNOSIS — M47812 Spondylosis without myelopathy or radiculopathy, cervical region: Secondary | ICD-10-CM | POA: Diagnosis not present

## 2016-12-21 DIAGNOSIS — R197 Diarrhea, unspecified: Secondary | ICD-10-CM | POA: Diagnosis not present

## 2016-12-21 DIAGNOSIS — F419 Anxiety disorder, unspecified: Secondary | ICD-10-CM | POA: Diagnosis not present

## 2016-12-25 ENCOUNTER — Emergency Department (HOSPITAL_COMMUNITY)
Admission: EM | Admit: 2016-12-25 | Discharge: 2016-12-25 | Disposition: A | Discharge status: Home / Self Care | Payer: Medicare Other | Attending: Emergency Medicine | Admitting: Emergency Medicine

## 2016-12-25 ENCOUNTER — Encounter (HOSPITAL_COMMUNITY): Payer: Self-pay | Admitting: Emergency Medicine

## 2016-12-25 DIAGNOSIS — Y999 Unspecified external cause status: Secondary | ICD-10-CM | POA: Diagnosis not present

## 2016-12-25 DIAGNOSIS — X58XXXA Exposure to other specified factors, initial encounter: Secondary | ICD-10-CM | POA: Diagnosis not present

## 2016-12-25 DIAGNOSIS — S40021A Contusion of right upper arm, initial encounter: Secondary | ICD-10-CM | POA: Diagnosis not present

## 2016-12-25 DIAGNOSIS — S5012XA Contusion of left forearm, initial encounter: Secondary | ICD-10-CM | POA: Diagnosis not present

## 2016-12-25 DIAGNOSIS — S40022A Contusion of left upper arm, initial encounter: Secondary | ICD-10-CM | POA: Diagnosis not present

## 2016-12-25 DIAGNOSIS — Y929 Unspecified place or not applicable: Secondary | ICD-10-CM | POA: Diagnosis not present

## 2016-12-25 DIAGNOSIS — I10 Essential (primary) hypertension: Secondary | ICD-10-CM | POA: Diagnosis not present

## 2016-12-25 DIAGNOSIS — T148XXA Other injury of unspecified body region, initial encounter: Secondary | ICD-10-CM

## 2016-12-25 DIAGNOSIS — F17210 Nicotine dependence, cigarettes, uncomplicated: Secondary | ICD-10-CM | POA: Insufficient documentation

## 2016-12-25 DIAGNOSIS — S5011XA Contusion of right forearm, initial encounter: Secondary | ICD-10-CM | POA: Insufficient documentation

## 2016-12-25 DIAGNOSIS — Y939 Activity, unspecified: Secondary | ICD-10-CM | POA: Insufficient documentation

## 2016-12-25 DIAGNOSIS — Z79899 Other long term (current) drug therapy: Secondary | ICD-10-CM | POA: Diagnosis not present

## 2016-12-25 DIAGNOSIS — S59911A Unspecified injury of right forearm, initial encounter: Secondary | ICD-10-CM | POA: Diagnosis present

## 2016-12-25 LAB — BASIC METABOLIC PANEL
Anion gap: 11 (ref 5–15)
BUN: 21 mg/dL — AB (ref 6–20)
CALCIUM: 9.6 mg/dL (ref 8.9–10.3)
CO2: 22 mmol/L (ref 22–32)
CREATININE: 1.28 mg/dL — AB (ref 0.44–1.00)
Chloride: 103 mmol/L (ref 101–111)
GFR calc Af Amer: 49 mL/min — ABNORMAL LOW (ref >60)
GFR, EST NON AFRICAN AMERICAN: 42 mL/min — AB (ref >60)
GLUCOSE: 136 mg/dL — AB (ref 65–99)
Potassium: 3.1 mmol/L — ABNORMAL LOW (ref 3.5–5.1)
SODIUM: 136 mmol/L (ref 135–145)

## 2016-12-25 LAB — CBC WITH DIFFERENTIAL/PLATELET
Basophils Absolute: 0.1 10*3/uL (ref 0.0–0.1)
Basophils Relative: 1 %
EOS ABS: 0.1 10*3/uL (ref 0.0–0.7)
EOS PCT: 0 %
HCT: 42.5 % (ref 36.0–46.0)
Hemoglobin: 15 g/dL (ref 12.0–15.0)
LYMPHS ABS: 3.1 10*3/uL (ref 0.7–4.0)
LYMPHS PCT: 24 %
MCH: 33.3 pg (ref 26.0–34.0)
MCHC: 35.3 g/dL (ref 30.0–36.0)
MCV: 94.2 fL (ref 78.0–100.0)
MONO ABS: 0.8 10*3/uL (ref 0.1–1.0)
Monocytes Relative: 6 %
Neutro Abs: 9 10*3/uL — ABNORMAL HIGH (ref 1.7–7.7)
Neutrophils Relative %: 69 %
PLATELETS: 285 10*3/uL (ref 150–400)
RBC: 4.51 MIL/uL (ref 3.87–5.11)
RDW: 13.5 % (ref 11.5–15.5)
WBC: 13 10*3/uL — AB (ref 4.0–10.5)

## 2016-12-25 NOTE — ED Provider Notes (Signed)
[facility] DEPT Provider Note   CSN: [id number] Arrival date & time: 12/25/16  1332     History   Chief Complaint Chief Complaint  Patient presents with  . Bleeding/Bruising    HPI [name] is a 66 y.o. female.  Pt presents to the ED today with some bruises on both arms.  Pt does not recall any trauma.  She denies any other associated sx.  She is very anxious and scared about the bruises.      Past Medical History:  Diagnosis Date  . Arthritis   . Cervical spinal stenosis   . Complication of anesthesia    "makes me cry for days."  . Diverticulitis   . Family history of anesthesia complication    Sister has nausea and vomitting.  Marland Kitchen Hypercholesteremia   . Hypertension     Patient Active Problem List   Diagnosis Date Noted  . Vitamin D deficiency 01/02/2016  . Arthropathy 01/02/2016  . Tremor 01/02/2016  . Primary generalized (osteo)arthritis 01/02/2016  . Cigarette nicotine dependence, uncomplicated 08/65/7846  . Female climacteric state 01/02/2016  . Hyperglycemia 01/02/2016  . Essential (primary) hypertension 01/02/2016  . Elevated white blood cell count 01/02/2016  . 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 Cervicalgia 01/02/2016  . Radiculopathy of cervical region 01/02/2016  . Essential tremor 01/02/2016  . Anxiety disorder 01/02/2016  . Pure hypercholesterolemia 01/02/2016  . Weakness 01/02/2016  . Fatigue 01/02/2016  . HLD (hyperlipidemia) 01/02/2016  . Body mass index (BMI) of 35.0-35.9 in adult 08/28/2014    Past Surgical History:  Procedure Laterality Date  . ABDOMINAL HYSTERECTOMY  1995  . ANTERIOR CERVICAL DECOMP/DISCECTOMY FUSION  12/30/11  . BACK SURGERY  1992   . COLECTOMY  2004   "lost 1/3 of my colon"  . THORACIC FUSION  1992  . TUMOR REMOVAL     small intestine    OB History    No data available       Home Medications    Prior to Admission medications   Medication Sig Start Date  End Date Taking? Authorizing Provider  acetaminophen (TYLENOL) 500 MG tablet Take 500 mg by mouth every 6 (six) hours as needed for mild pain, moderate pain, fever or headache.    [provider]  ALPRAZolam Duanne Moron) 0.5 MG tablet Take 0.25 tablets by mouth every 6 (six) hours as needed for anxiety or sleep. anxiety 02/20/15   [provider]  Ascorbic Acid (VITAMIN C PO) Take 1 tablet by mouth daily.    [provider]  calcium carbonate (OS-CAL - DOSED IN MG OF ELEMENTAL CALCIUM) 1250 (500 Ca) MG tablet Take 1 tablet by mouth 3 (three) times a week.    [provider]  ferrous sulfate 325 (65 FE) MG tablet Take 325 mg by mouth 3 (three) times a week.    [provider]  GLUCOSAMINE PO Take 1 tablet by mouth 3 (three) times a week.    [provider]  hydrochlorothiazide (HYDRODIURIL) 12.5 MG tablet Take 6.25 mg by mouth daily with breakfast.  09/29/15   [provider]  HYDROcodone-acetaminophen (NORCO) 5-325 MG tablet Take 1-2 tablets by mouth every 6 (six) hours as needed. Patient taking differently: Take 1-2 tablets by mouth every 6 (six) hours as needed for moderate pain.  08/03/15   Veryl Speak, MD  influenza vac recom quadrivalent (FLUBLOK) 0.5 ML injection Inject 0.5 mLs into the muscle once.    [provider]  Multiple Vitamin (MULTIVITAMIN WITH MINERALS) TABS tablet Take 1 tablet by mouth 3 (three) times a week.    [provider]  valsartan (DIOVAN) 40 MG tablet Take 40 mg by mouth daily with breakfast.  09/29/15   [provider]  Vitamin D, Ergocalciferol, (DRISDOL) 50000 UNITS CAPS capsule Take 1 capsule by mouth once a week. Takes on Saturday or Sunday 03/27/15   [provider]    Family History Family History  Problem Relation Age of Onset  . Anesthesia problems Sister     Social History Social History  Substance Use Topics  . Smoking status: Current Every Day Smoker     Packs/day: 0.50    Years: 40.00    Types: Cigarettes  . Smokeless tobacco: Never Used  . Alcohol use No     Allergies   Lyrica [pregabalin]; Primidone; Simvastatin; Statins; and Meloxicam   Review of Systems Review of Systems  Hematological: Bruises/bleeds easily.  All other systems reviewed and are negative.    Physical Exam Updated Vital Signs BP (!) 146/83 (BP Location: Left Arm)   Pulse 98   Temp 98 F (36.7 C) (Oral)   Resp 18   SpO2 97%   Physical Exam  Constitutional: She is oriented to person, place, and time. She appears well-developed and well-nourished.  HENT:  Head: Normocephalic and atraumatic.  Right Ear: External ear normal.  Left Ear: External ear normal.  Nose: Nose normal.  Mouth/Throat: Oropharynx is clear and moist.  Eyes: Conjunctivae and EOM are normal. Pupils are equal, round, and reactive to light.  Neck: Normal range of motion. Neck supple.  Cardiovascular: Normal rate, regular rhythm, normal heart sounds and intact distal pulses.   Pulmonary/Chest: Effort normal and breath sounds normal.  Abdominal: Soft. Bowel sounds are normal.  Musculoskeletal: Normal range of motion.  Neurological: She is alert and oriented to person, place, and time.  Skin:     Psychiatric: She has a normal mood and affect. Her behavior is normal. Judgment and thought content normal.  Nursing note and vitals reviewed.    ED Treatments / Results  Labs (all labs ordered are listed, but only abnormal results are displayed) Labs Reviewed  CBC WITH DIFFERENTIAL/PLATELET - Abnormal; Notable for the following:       Result Value   WBC 13.0 (*)    Neutro Abs 9.0 (*)    All other components within normal limits  BASIC METABOLIC PANEL - Abnormal; Notable for the following:    Potassium 3.1 (*)    Glucose, Bld 136 (*)    BUN 21 (*)    Creatinine, Ser 1.28 (*)    GFR calc non Af Amer 42 (*)    GFR calc Af Amer 49 (*)    All other components within normal limits     EKG  EKG Interpretation None       Radiology No results found.  Procedures Procedures (including critical care time)  Medications Ordered in ED Medications - No data to display   Initial Impression / Assessment and Plan / ED Course  I have reviewed the triage vital signs and the nursing notes.  Pertinent labs & imaging results that were available during my care of the patient were reviewed by me and considered in my medical decision making (see chart for details).    Pt is stable for d/c.  She is very concerned she has an ID from getting her carpet pulled up.  She is worried that she has the  plague.  I told her that she does not have the plague.  Pt is very anxious.  She is told to return if she develops fevers or other sx.  Final Clinical Impressions(s) / ED Diagnoses   Final diagnoses:  Bruising    New Prescriptions New Prescriptions   No medications on file     Isla Pence, MD 12/25/16 1529

## 2016-12-25 NOTE — ED Triage Notes (Signed)
Pt appears anxious with triage and verbalizes "I am scared." Pt points at arms to what appears to be small hematomas to bilateral arms. Pt denies injury.

## 2016-12-27 ENCOUNTER — Emergency Department (HOSPITAL_COMMUNITY)
Admission: EM | Admit: 2016-12-27 | Discharge: 2016-12-27 | Disposition: A | Discharge status: Home / Self Care | Payer: Medicare Other | Attending: Emergency Medicine | Admitting: Emergency Medicine

## 2016-12-27 ENCOUNTER — Encounter (HOSPITAL_COMMUNITY): Payer: Self-pay

## 2016-12-27 ENCOUNTER — Emergency Department (HOSPITAL_COMMUNITY): Payer: Medicare Other

## 2016-12-27 DIAGNOSIS — L0100 Impetigo, unspecified: Secondary | ICD-10-CM | POA: Diagnosis not present

## 2016-12-27 DIAGNOSIS — R0602 Shortness of breath: Secondary | ICD-10-CM | POA: Diagnosis not present

## 2016-12-27 DIAGNOSIS — I10 Essential (primary) hypertension: Secondary | ICD-10-CM | POA: Diagnosis not present

## 2016-12-27 DIAGNOSIS — F17210 Nicotine dependence, cigarettes, uncomplicated: Secondary | ICD-10-CM | POA: Diagnosis not present

## 2016-12-27 LAB — CBC WITH DIFFERENTIAL/PLATELET
Basophils Absolute: 0.1 10*3/uL (ref 0.0–0.1)
Basophils Relative: 1 %
Eosinophils Absolute: 0.1 10*3/uL (ref 0.0–0.7)
Eosinophils Relative: 1 %
HEMATOCRIT: 43.3 % (ref 36.0–46.0)
HEMOGLOBIN: 14.7 g/dL (ref 12.0–15.0)
LYMPHS ABS: 2.5 10*3/uL (ref 0.7–4.0)
Lymphocytes Relative: 25 %
MCH: 32 pg (ref 26.0–34.0)
MCHC: 33.9 g/dL (ref 30.0–36.0)
MCV: 94.3 fL (ref 78.0–100.0)
MONOS PCT: 9 %
Monocytes Absolute: 0.9 10*3/uL (ref 0.1–1.0)
NEUTROS ABS: 6.5 10*3/uL (ref 1.7–7.7)
NEUTROS PCT: 64 %
Platelets: 278 10*3/uL (ref 150–400)
RBC: 4.59 MIL/uL (ref 3.87–5.11)
RDW: 13.6 % (ref 11.5–15.5)
WBC: 10 10*3/uL (ref 4.0–10.5)

## 2016-12-27 LAB — COMPREHENSIVE METABOLIC PANEL
ALBUMIN: 4.2 g/dL (ref 3.5–5.0)
ALK PHOS: 40 U/L (ref 38–126)
ALT: 28 U/L (ref 14–54)
ANION GAP: 12 (ref 5–15)
AST: 31 U/L (ref 15–41)
BUN: 22 mg/dL — ABNORMAL HIGH (ref 6–20)
CHLORIDE: 101 mmol/L (ref 101–111)
CO2: 23 mmol/L (ref 22–32)
Calcium: 9.2 mg/dL (ref 8.9–10.3)
Creatinine, Ser: 1.21 mg/dL — ABNORMAL HIGH (ref 0.44–1.00)
GFR calc Af Amer: 52 mL/min — ABNORMAL LOW (ref >60)
GFR calc non Af Amer: 45 mL/min — ABNORMAL LOW (ref >60)
Glucose, Bld: 121 mg/dL — ABNORMAL HIGH (ref 65–99)
POTASSIUM: 3.3 mmol/L — AB (ref 3.5–5.1)
SODIUM: 136 mmol/L (ref 135–145)
Total Bilirubin: 1.8 mg/dL — ABNORMAL HIGH (ref 0.3–1.2)
Total Protein: 8.7 g/dL — ABNORMAL HIGH (ref 6.5–8.1)

## 2016-12-27 LAB — TROPONIN I: Troponin I: <0.03 ng/mL (ref <0.03)

## 2016-12-27 LAB — ETHANOL: Alcohol, Ethyl (B): <5 mg/dL (ref <5)

## 2016-12-27 MED ORDER — MUPIROCIN CALCIUM 2 % NA OINT: TOPICAL_OINTMENT | NASAL | 0 refills | Status: DC

## 2016-12-27 NOTE — ED Notes (Signed)
Told pt we need a urine sample and she said "I am totally dried up and they wont give me anything, not even water"

## 2016-12-27 NOTE — ED Notes (Signed)
Patient is alert and oriented x3.  She was given DC instructions and follow up visit instructions.  Patient gave verbal understanding. She was DC ambulatory under her own power to home.  V/S stable.  He was not showing any signs of distress on DC 

## 2016-12-27 NOTE — ED Triage Notes (Signed)
Come to triage room crying states they are going to let me die Im going to die soon states she has shortness of breath able to speak in full sentences pulse ox 97% on room air states seen her Saturday for same and sent home.

## 2016-12-27 NOTE — ED Notes (Signed)
Patient is yelling "something is crawling on me look at my skin, what is this. Omg my nose is falling off now. What is wrong with me. Did I do something wrong for my body to be falling apart. Look at my skin, why is my skin crawling with these things." Skin assessed and there is nothing on the patient. There is some right arm bruising but no signs of any other abnormalities."

## 2016-12-27 NOTE — ED Notes (Signed)
Writer attempted to get blood, pt yelled and said "I had it done Saturday, there's nothing wrong with me."  RN notified

## 2016-12-28 DIAGNOSIS — L821 Other seborrheic keratosis: Secondary | ICD-10-CM | POA: Diagnosis not present

## 2016-12-28 DIAGNOSIS — D692 Other nonthrombocytopenic purpura: Secondary | ICD-10-CM | POA: Diagnosis not present

## 2016-12-28 DIAGNOSIS — L0889 Other specified local infections of the skin and subcutaneous tissue: Secondary | ICD-10-CM | POA: Diagnosis not present

## 2016-12-28 DIAGNOSIS — L0109 Other impetigo: Secondary | ICD-10-CM | POA: Diagnosis not present

## 2016-12-28 DIAGNOSIS — L218 Other seborrheic dermatitis: Secondary | ICD-10-CM | POA: Diagnosis not present

## 2016-12-30 ENCOUNTER — Emergency Department (HOSPITAL_COMMUNITY)
Admission: EM | Admit: 2016-12-30 | Discharge: 2016-12-30 | Disposition: A | Discharge status: Home / Self Care | Payer: Medicare Other | Attending: Dermatology | Admitting: Dermatology

## 2016-12-30 DIAGNOSIS — Z79899 Other long term (current) drug therapy: Secondary | ICD-10-CM | POA: Insufficient documentation

## 2016-12-30 DIAGNOSIS — Z5321 Procedure and treatment not carried out due to patient leaving prior to being seen by health care provider: Secondary | ICD-10-CM | POA: Insufficient documentation

## 2016-12-30 DIAGNOSIS — R05 Cough: Secondary | ICD-10-CM | POA: Insufficient documentation

## 2016-12-30 DIAGNOSIS — F17210 Nicotine dependence, cigarettes, uncomplicated: Secondary | ICD-10-CM | POA: Insufficient documentation

## 2016-12-30 NOTE — ED Notes (Signed)
Called patient 3x no response.  

## 2016-12-30 NOTE — ED Triage Notes (Signed)
Pt arrives via POV from urgent care arrived c/o exposure to black mold. Pt crying that she's going to die. Pts vital signs WNL. Denies SOB, reports some cough. States seen at [facility] for same this week. Pt speaking in full sentences, NAD at present.

## 2017-01-01 ENCOUNTER — Emergency Department (HOSPITAL_COMMUNITY)
Admission: EM | Admit: 2017-01-01 | Discharge: 2017-01-01 | Disposition: A | Discharge status: Home / Self Care | Payer: Medicare Other | Attending: Emergency Medicine | Admitting: Emergency Medicine

## 2017-01-01 ENCOUNTER — Encounter (HOSPITAL_COMMUNITY): Payer: Self-pay | Admitting: Emergency Medicine

## 2017-01-01 DIAGNOSIS — F17210 Nicotine dependence, cigarettes, uncomplicated: Secondary | ICD-10-CM | POA: Diagnosis not present

## 2017-01-01 DIAGNOSIS — L309 Dermatitis, unspecified: Secondary | ICD-10-CM | POA: Diagnosis not present

## 2017-01-01 DIAGNOSIS — R404 Transient alteration of awareness: Secondary | ICD-10-CM | POA: Diagnosis not present

## 2017-01-01 DIAGNOSIS — Z79899 Other long term (current) drug therapy: Secondary | ICD-10-CM | POA: Insufficient documentation

## 2017-01-01 DIAGNOSIS — I10 Essential (primary) hypertension: Secondary | ICD-10-CM | POA: Insufficient documentation

## 2017-01-01 DIAGNOSIS — R531 Weakness: Secondary | ICD-10-CM

## 2017-01-01 DIAGNOSIS — E876 Hypokalemia: Secondary | ICD-10-CM | POA: Insufficient documentation

## 2017-01-01 LAB — CBC WITH DIFFERENTIAL/PLATELET
BASOS PCT: 1 %
Basophils Absolute: 0.1 10*3/uL (ref 0.0–0.1)
EOS ABS: 0 10*3/uL (ref 0.0–0.7)
EOS PCT: 0 %
HCT: 39.6 % (ref 36.0–46.0)
HEMOGLOBIN: 13.8 g/dL (ref 12.0–15.0)
Lymphocytes Relative: 32 %
Lymphs Abs: 3 10*3/uL (ref 0.7–4.0)
MCH: 32.6 pg (ref 26.0–34.0)
MCHC: 34.8 g/dL (ref 30.0–36.0)
MCV: 93.6 fL (ref 78.0–100.0)
MONO ABS: 0.7 10*3/uL (ref 0.1–1.0)
MONOS PCT: 8 %
NEUTROS PCT: 59 %
Neutro Abs: 5.5 10*3/uL (ref 1.7–7.7)
PLATELETS: 251 10*3/uL (ref 150–400)
RBC: 4.23 MIL/uL (ref 3.87–5.11)
RDW: 13.9 % (ref 11.5–15.5)
WBC: 9.3 10*3/uL (ref 4.0–10.5)

## 2017-01-01 LAB — URINALYSIS, ROUTINE W REFLEX MICROSCOPIC
BILIRUBIN URINE: NEGATIVE
GLUCOSE, UA: NEGATIVE mg/dL
Ketones, ur: NEGATIVE mg/dL
Leukocytes, UA: NEGATIVE
NITRITE: NEGATIVE
PH: 5 (ref 5.0–8.0)
Protein, ur: NEGATIVE mg/dL
SPECIFIC GRAVITY, URINE: 1.005 (ref 1.005–1.030)
Squamous Epithelial / LPF: NONE SEEN

## 2017-01-01 LAB — COMPREHENSIVE METABOLIC PANEL
ALK PHOS: 43 U/L (ref 38–126)
ALT: 28 U/L (ref 14–54)
AST: 29 U/L (ref 15–41)
Albumin: 3.6 g/dL (ref 3.5–5.0)
Anion gap: 8 (ref 5–15)
BUN: 15 mg/dL (ref 6–20)
CALCIUM: 9.1 mg/dL (ref 8.9–10.3)
CO2: 23 mmol/L (ref 22–32)
CREATININE: 0.96 mg/dL (ref 0.44–1.00)
Chloride: 106 mmol/L (ref 101–111)
GFR, EST NON AFRICAN AMERICAN: 60 mL/min — AB (ref >60)
Glucose, Bld: 131 mg/dL — ABNORMAL HIGH (ref 65–99)
Potassium: 3 mmol/L — ABNORMAL LOW (ref 3.5–5.1)
SODIUM: 137 mmol/L (ref 135–145)
Total Bilirubin: 0.8 mg/dL (ref 0.3–1.2)
Total Protein: 7.3 g/dL (ref 6.5–8.1)

## 2017-01-01 LAB — I-STAT TROPONIN, ED: Troponin i, poc: 0 ng/mL (ref 0.00–0.08)

## 2017-01-01 LAB — MAGNESIUM: Magnesium: 2 mg/dL (ref 1.7–2.4)

## 2017-01-01 MED ORDER — POTASSIUM CHLORIDE CRYS ER 20 MEQ PO TBCR
40.0000 meq | EXTENDED_RELEASE_TABLET | Freq: Once | ORAL | Status: AC
  Administered 2017-01-01: 40 meq via ORAL
  Filled 2017-01-01: qty 2

## 2017-01-01 MED ORDER — SODIUM CHLORIDE 0.9 % IV BOLUS (SEPSIS)
1000.0000 mL | Freq: Once | INTRAVENOUS | Status: AC
  Administered 2017-01-01: 1000 mL via INTRAVENOUS

## 2017-01-01 MED ORDER — METOCLOPRAMIDE HCL 5 MG/ML IJ SOLN
5.0000 mg | Freq: Once | INTRAMUSCULAR | Status: AC
  Administered 2017-01-01: 5 mg via INTRAVENOUS
  Filled 2017-01-01: qty 2

## 2017-01-01 NOTE — ED Notes (Signed)
ED Provider at bedside. 

## 2017-01-01 NOTE — ED Triage Notes (Signed)
Pt arrives from hotel via [facility] reporting ongoing malaise, weakness, with rash since March.  Pt reports SOB earlier, resolved at this time.  Pt repoorts seeing dermatologist for blister like rash on nose, reports taking doxycycline.  Diffuse red rash noted to BUE, BLE.  Pt tearful.

## 2017-01-01 NOTE — ED Provider Notes (Signed)
[facility] DEPT Provider Note   CSN: [id number] Arrival date & time: 01/01/17  1026     History   Chief Complaint Chief Complaint  Patient presents with  . Rash  . Weakness    HPI [name] is a 66 y.o. female.comPlains of rash on her extremities for the past 2 weeks and blisters on her nose for 2 weeks. Rash is not painful or pruritic. No fever. She also complains of generalized weakness and nausea for 3 months. Nothing makes symptoms better or worse she's had multiple emergency department visits and reports that she is been seen by Dr. Lady Deutscher for same complaints multiple times. Nothing makes symptoms better or worse. No fever. Patient does report that she has discovered mold in her home and "winged cockroaches" she is presently staying in a hotel and exterminate or is scheduled to come to her home in 2 days. She denies any cough or shortness of breath. No other associated symptoms. Brought by EMS. No treatment prior to coming here today.  I spoke with patient's sister separately with patient's permission. Sister feels the patient is depressed. Patient specifically discussed denies wanting to harm herself and denies feeling depressed  HPI  Past Medical History:  Diagnosis Date  . Arthritis   . Cervical spinal stenosis   . Complication of anesthesia    "makes me cry for days."  . Diverticulitis   . Family history of anesthesia complication    Sister has nausea and vomitting.  Marland Kitchen Hypercholesteremia   . Hypertension     Patient Active Problem List   Diagnosis Date Noted  . Vitamin D deficiency 01/02/2016  . Arthropathy 01/02/2016  . Tremor 01/02/2016  . Primary generalized (osteo)arthritis 01/02/2016  . Cigarette nicotine dependence, uncomplicated 85/46/2703  . Female climacteric state 01/02/2016  . Hyperglycemia 01/02/2016  . Essential (primary) hypertension 01/02/2016  . Elevated white blood cell count 01/02/2016  . 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 Cervicalgia 01/02/2016  . Radiculopathy of cervical region 01/02/2016  . Essential tremor 01/02/2016  . Anxiety disorder 01/02/2016  . Pure hypercholesterolemia 01/02/2016  . Weakness 01/02/2016  . Fatigue 01/02/2016  . HLD (hyperlipidemia) 01/02/2016  . Body mass index (BMI) of 35.0-35.9 in adult 08/28/2014    Past Surgical History:  Procedure Laterality Date  . ABDOMINAL HYSTERECTOMY  1995  . ANTERIOR CERVICAL DECOMP/DISCECTOMY FUSION  12/30/11  . BACK SURGERY  1992   . COLECTOMY  2004   "lost 1/3 of my colon"  . THORACIC FUSION  1992  . TUMOR REMOVAL     small intestine    OB History    No data available       Home Medications    Prior to Admission medications   Medication Sig Start Date End Date Taking? Authorizing Provider  acetaminophen (TYLENOL) 500 MG tablet Take 500 mg by mouth every 6 (six) hours as needed for mild pain, moderate pain, fever or headache.   Yes [provider]  ALPRAZolam Duanne Moron) 0.5 MG tablet Take 0.25 tablets by mouth every 6 (six) hours as needed for anxiety or sleep. anxiety 02/20/15  Yes [provider]  calcium carbonate (OS-CAL - DOSED IN MG OF ELEMENTAL CALCIUM) 1250 (500 Ca) MG tablet Take 1 tablet by mouth 3 (three) times a week.   Yes [provider]  ferrous sulfate 325 (65 FE) MG tablet Take 325 mg by mouth 3 (three) times a week.   Yes [provider]  hydrochlorothiazide (HYDRODIURIL) 12.5 MG tablet Take 6.25 mg by mouth daily with breakfast.  09/29/15  Yes [provider]  HYDROcodone-acetaminophen (NORCO) 5-325 MG tablet Take 1-2 tablets by mouth every 6 (six) hours as needed. Patient taking differently: Take 1-2 tablets by mouth every 6 (six) hours as needed for moderate pain.  08/03/15  Yes Delo, Nathaneil Canary, MD  mupirocin nasal ointment (BACTROBAN) 2 % Apply in each nostril daily 12/27/16  Yes Jola Schmidt, MD  valsartan (DIOVAN) 40 MG tablet Take 40 mg by  mouth daily with breakfast.  09/29/15  Yes [provider]  Vitamin D, Ergocalciferol, (DRISDOL) 50000 UNITS CAPS capsule Take 1 capsule by mouth once a week. Takes on Saturday or Sunday 03/27/15  Yes [provider]    Family History Family History  Problem Relation Age of Onset  . Anesthesia problems Sister     Social History Social History  Substance Use Topics  . Smoking status: Current Every Day Smoker    Packs/day: 1.00    Years: 40.00    Types: Cigarettes  . Smokeless tobacco: Never Used  . Alcohol use No     Allergies   Lyrica [pregabalin]; Primidone; Simvastatin; Statins; and Meloxicam   Review of Systems Review of Systems  HENT: Negative.   Respiratory: Negative.   Cardiovascular: Negative.   Gastrointestinal: Positive for nausea.  Musculoskeletal: Negative.   Skin: Positive for rash.  Neurological: Positive for weakness.       Generalized weakness  Psychiatric/Behavioral: Negative.   All other systems reviewed and are negative.    Physical Exam Updated Vital Signs BP 123/82   Pulse 65   Temp 99 F (37.2 C) (Oral)   Resp 17   Ht 5\' 5"  (1.651 m)   Wt 205 lb (93 kg)   SpO2 91%   BMI 34.11 kg/m   Physical Exam  Constitutional: She is oriented to person, place, and time. She appears well-developed and well-nourished. No distress.  HENT:  Head: Normocephalic and atraumatic.  Eyes: Conjunctivae are normal. Pupils are equal, round, and reactive to light.  Neck: Neck supple. No tracheal deviation present. No thyromegaly present.  Cardiovascular: Normal rate and regular rhythm.   No murmur heard. Pulmonary/Chest: Effort normal and breath sounds normal.  Abdominal: Soft. Bowel sounds are normal. She exhibits no distension. There is no tenderness.  Musculoskeletal: Normal range of motion. She exhibits no edema or tenderness.  Neurological: She is alert and oriented to person, place, and time. Coordination normal.  Gait normal  Skin:  Skin is warm and dry. Rash noted.  There are several lesions about her extremities which appear to be abrasions in various stages of healing they similar linear and some R irregular-appearing she also has a few ecchymoses about her extremities. No lesions involving palms or soles  Psychiatric: She has a normal mood and affect.  Nursing note and vitals reviewed.    ED Treatments / Results  Labs (all labs ordered are listed, but only abnormal results are displayed) Labs Reviewed  COMPREHENSIVE METABOLIC PANEL - Abnormal; Notable for the following:       Result Value   Potassium 3.0 (*)    Glucose, Bld 131 (*)    GFR calc non Af Amer 60 (*)    All other components within normal limits  URINALYSIS, ROUTINE W REFLEX MICROSCOPIC - Abnormal; Notable for the following:    Color, Urine STRAW (*)    Hgb urine dipstick SMALL (*)    Bacteria, UA FEW (*)  All other components within normal limits  CBC WITH DIFFERENTIAL/PLATELET  MAGNESIUM  I-STAT TROPOININ, ED   Results for orders placed or performed during the hospital encounter of 01/01/17  Comprehensive metabolic panel  Result Value Ref Range   Sodium 137 135 - 145 mmol/L   Potassium 3.0 (L) 3.5 - 5.1 mmol/L   Chloride 106 101 - 111 mmol/L   CO2 23 22 - 32 mmol/L   Glucose, Bld 131 (H) 65 - 99 mg/dL   BUN 15 6 - 20 mg/dL   Creatinine, Ser 0.96 0.44 - 1.00 mg/dL   Calcium 9.1 8.9 - 10.3 mg/dL   Total Protein 7.3 6.5 - 8.1 g/dL   Albumin 3.6 3.5 - 5.0 g/dL   AST 29 15 - 41 U/L   ALT 28 14 - 54 U/L   Alkaline Phosphatase 43 38 - 126 U/L   Total Bilirubin 0.8 0.3 - 1.2 mg/dL   GFR calc non Af Amer 60 (L) >60 mL/min   GFR calc Af Amer >60 >60 mL/min   Anion gap 8 5 - 15  CBC with Differential/Platelet  Result Value Ref Range   WBC 9.3 4.0 - 10.5 K/uL   RBC 4.23 3.87 - 5.11 MIL/uL   Hemoglobin 13.8 12.0 - 15.0 g/dL   HCT 39.6 36.0 - 46.0 %   MCV 93.6 78.0 - 100.0 fL   MCH 32.6 26.0 - 34.0 pg   MCHC 34.8 30.0 - 36.0 g/dL   RDW  13.9 11.5 - 15.5 %   Platelets 251 150 - 400 K/uL   Neutrophils Relative % 59 %   Neutro Abs 5.5 1.7 - 7.7 K/uL   Lymphocytes Relative 32 %   Lymphs Abs 3.0 0.7 - 4.0 K/uL   Monocytes Relative 8 %   Monocytes Absolute 0.7 0.1 - 1.0 K/uL   Eosinophils Relative 0 %   Eosinophils Absolute 0.0 0.0 - 0.7 K/uL   Basophils Relative 1 %   Basophils Absolute 0.1 0.0 - 0.1 K/uL  Urinalysis, Routine w reflex microscopic  Result Value Ref Range   Color, Urine STRAW (A) YELLOW   APPearance CLEAR CLEAR   Specific Gravity, Urine 1.005 1.005 - 1.030   pH 5.0 5.0 - 8.0   Glucose, UA NEGATIVE NEGATIVE mg/dL   Hgb urine dipstick SMALL (A) NEGATIVE   Bilirubin Urine NEGATIVE NEGATIVE   Ketones, ur NEGATIVE NEGATIVE mg/dL   Protein, ur NEGATIVE NEGATIVE mg/dL   Nitrite NEGATIVE NEGATIVE   Leukocytes, UA NEGATIVE NEGATIVE   RBC / HPF 0-5 0 - 5 RBC/hpf   WBC, UA 0-5 0 - 5 WBC/hpf   Bacteria, UA FEW (A) NONE SEEN   Squamous Epithelial / LPF NONE SEEN NONE SEEN  Magnesium  Result Value Ref Range   Magnesium 2.0 1.7 - 2.4 mg/dL  I-stat troponin, ED  Result Value Ref Range   Troponin i, poc 0.00 0.00 - 0.08 ng/mL   Comment 3           Dg Chest 2 View  Result Date: 12/27/2016 CLINICAL DATA:  Shortness of breath pain EXAM: CHEST  2 VIEW COMPARISON:  12/08/2015. FINDINGS: Mediastinum and hilar structures are normal. Mild lingular pleural-parenchymal scarring again noted. No focal acute infiltrate. Heart size normal. Surgical clips left upper quadrant. Prior cervicothoracic spine fusion. IMPRESSION: Mild lingular pleural-parenchymal scarring again noted. No acute abnormality identified . Electronically Signed   By: Marcello Moores  Register   On: 12/27/2016 06:52    .EKG  EKG Interpretation  Date/Time:  Saturday Jan 01 2017 10:31:17 EDT Ventricular Rate:  77 PR Interval:    QRS Duration: 100 QT Interval:  397 QTC Calculation: 450 R Axis:   21 Text Interpretation:  Sinus rhythm Low voltage, precordial  leads Probable anteroseptal infarct, old No significant change since last tracing Confirmed by Winfred Leeds  MD, Claudett Bayly [phone]) on 01/01/2017 11:34:10 AM       Radiology No results found.  Procedures Procedures (including critical care time)  Medications Ordered in ED Medications  potassium chloride SA (K-DUR,KLOR-CON) CR tablet 40 mEq (not administered)  sodium chloride 0.9 % bolus 1,000 mL (1,000 mLs Intravenous New Bag/Given 01/01/17 1208)  metoCLOPramide (REGLAN) injection 5 mg (5 mg Intravenous Given 01/01/17 1208)   1:40 PM feels improved after treatment with intravenous normal saline and with intravenous Reglan and is presently hungry  Initial Impression / Assessment and Plan / ED Course  I have reviewed the triage vital signs and the nursing notes.  Pertinent labs & imaging results that were available during my care of the patient were reviewed by me and considered in my medical decision making (see chart for details).     3:40 PM Patient feels much improved after treatment with intravenous hydration and she ate a meal while in the emergency department. She is alert and with hard. She received oral potassium supplementation while here Reports to me that she saw a dermatologist for her rash last week. I've advised her to follow-up with dermatologist for rash. I've also advised her to follow up with Dr. Laurann Montana as outpatient received oral potassium supplementation while here. She declines prescription for antiemetics stating she was not nauseated, only that her appetite has been diminished. Encourage oral hydration Final Clinical Impressions(s) / ED Diagnoses   #1 generalized weakness Final diagnoses:  None   #2chronic rash #3 hypokalemia  New Prescriptions New Prescriptions   No medications on file     Orlie Dakin, MD 01/01/17 [phone]

## 2017-01-01 NOTE — Discharge Instructions (Signed)
Make sure that you drink at least six 8 ounce glasses of water or Gatorade each day in order to stay well-hydrated. Call Dr. Delene Ruffini office in 2 days to arrange to be seen in her office next week. Tell office staff that you were seen in the emergency department here. Call your dermatologist office in 2 days to arrange for follow-up appointment regarding her rash. Don't go back into your home until mold has been eliminated. Return if concern for any reason

## 2017-01-01 NOTE — ED Notes (Signed)
Pt taken to restroom by wheelchair. No issues or difficulties.

## 2017-01-02 LAB — HIV ANTIBODY (ROUTINE TESTING W REFLEX): HIV Screen 4th Generation wRfx: NONREACTIVE

## 2017-01-02 LAB — RPR: RPR Ser Ql: NONREACTIVE

## 2017-01-04 NOTE — ED Provider Notes (Signed)
[facility] DEPT Provider Note   CSN: [id number] Arrival date & time: 12/27/16  9381     History   Chief Complaint Chief Complaint  Patient presents with  . Shortness of Breath  . Medical Clearance    HPI [name] is a 66 y.o. female.  HPI Patient presents to the emergency department with complaints of ongoing rash of her upper extremities.  She feels like things might be crawling on it.  She was seen in emergency department 2 days ago for similar symptoms.  She still thinks that she may have the plague. She denies new soaps or detergents.  No fevers or chills.  Denies chest pain or abdominal pain.  She has seen her primary care physician feels like no clear answer his been given to this point.   Past Medical History:  Diagnosis Date  . Arthritis   . Cervical spinal stenosis   . Complication of anesthesia    "makes me cry for days."  . Diverticulitis   . Family history of anesthesia complication    Sister has nausea and vomitting.  Marland Kitchen Hypercholesteremia   . Hypertension     Patient Active Problem List   Diagnosis Date Noted  . Vitamin D deficiency 01/02/2016  . Arthropathy 01/02/2016  . Tremor 01/02/2016  . Primary generalized (osteo)arthritis 01/02/2016  . Cigarette nicotine dependence, uncomplicated 01/75/1025  . Female climacteric state 01/02/2016  . Hyperglycemia 01/02/2016  . Essential (primary) hypertension 01/02/2016  . Elevated white blood cell count 01/02/2016  . 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 Cervicalgia 01/02/2016  . Radiculopathy of cervical region 01/02/2016  . Essential tremor 01/02/2016  . Anxiety disorder 01/02/2016  . Pure hypercholesterolemia 01/02/2016  . Weakness 01/02/2016  . Fatigue 01/02/2016  . HLD (hyperlipidemia) 01/02/2016  . Body mass index (BMI) of 35.0-35.9 in adult 08/28/2014    Past Surgical History:  Procedure Laterality Date  . ABDOMINAL HYSTERECTOMY  1995  . ANTERIOR  CERVICAL DECOMP/DISCECTOMY FUSION  12/30/11  . BACK SURGERY  1992   . COLECTOMY  2004   "lost 1/3 of my colon"  . THORACIC FUSION  1992  . TUMOR REMOVAL     small intestine    OB History    No data available       Home Medications    Prior to Admission medications   Medication Sig Start Date End Date Taking? Authorizing Provider  acetaminophen (TYLENOL) 500 MG tablet Take 500 mg by mouth every 6 (six) hours as needed for mild pain, moderate pain, fever or headache.   Yes [provider]  ALPRAZolam Duanne Moron) 0.5 MG tablet Take 0.25 tablets by mouth every 6 (six) hours as needed for anxiety or sleep. anxiety 02/20/15  Yes [provider]  calcium carbonate (OS-CAL - DOSED IN MG OF ELEMENTAL CALCIUM) 1250 (500 Ca) MG tablet Take 1 tablet by mouth 3 (three) times a week.   Yes [provider]  ferrous sulfate 325 (65 FE) MG tablet Take 325 mg by mouth 3 (three) times a week.   Yes [provider]  hydrochlorothiazide (HYDRODIURIL) 12.5 MG tablet Take 6.25 mg by mouth daily with breakfast.  09/29/15  Yes [provider]  HYDROcodone-acetaminophen (NORCO) 5-325 MG tablet Take 1-2 tablets by mouth every 6 (six) hours as needed. Patient taking differently: Take 1-2 tablets by mouth every 6 (six) hours as needed for moderate pain.  08/03/15  Yes Delo, Nathaneil Canary, MD  valsartan (DIOVAN) 40 MG tablet Take  40 mg by mouth daily with breakfast.  09/29/15  Yes [provider]  Vitamin D, Ergocalciferol, (DRISDOL) 50000 UNITS CAPS capsule Take 1 capsule by mouth once a week. Takes on Saturday or Sunday 03/27/15  Yes [provider]  mupirocin nasal ointment (BACTROBAN) 2 % Apply in each nostril daily 12/27/16   Jola Schmidt, MD    Family History Family History  Problem Relation Age of Onset  . Anesthesia problems Sister     Social History Social History  Substance Use Topics  . Smoking status: Current Every Day Smoker    Packs/day: 1.00     Years: 40.00    Types: Cigarettes  . Smokeless tobacco: Never Used  . Alcohol use No     Allergies   Lyrica [pregabalin]; Primidone; Simvastatin; Statins; and Meloxicam   Review of Systems Review of Systems  All other systems reviewed and are negative.    Physical Exam Updated Vital Signs BP 128/82 (BP Location: Left Arm)   Pulse 66   Temp 98.3 F (36.8 C) (Oral)   Resp 18   Ht 5\' 5"  (1.651 m)   Wt 93 kg (205 lb)   SpO2 94%   BMI 34.11 kg/m   Physical Exam  Constitutional: She is oriented to person, place, and time. She appears well-developed and well-nourished.  HENT:  Head: Normocephalic.  Small area near her left nare  consistent with impetigo  Neck: Normal range of motion.  Pulmonary/Chest: Effort normal.  Abdominal: She exhibits no distension.  Musculoskeletal: Normal range of motion.  Neurological: She is alert and oriented to person, place, and time.  Skin: Skin is warm and dry. No rash noted. No erythema.  Psychiatric:  Anxious appearing  Nursing note and vitals reviewed.    ED Treatments / Results  Labs (all labs ordered are listed, but only abnormal results are displayed) Labs Reviewed  COMPREHENSIVE METABOLIC PANEL - Abnormal; Notable for the following:       Result Value   Potassium 3.3 (*)    Glucose, Bld 121 (*)    BUN 22 (*)    Creatinine, Ser 1.21 (*)    Total Protein 8.7 (*)    Total Bilirubin 1.8 (*)    GFR calc non Af Amer 45 (*)    GFR calc Af Amer 52 (*)    All other components within normal limits  ETHANOL  CBC WITH DIFFERENTIAL/PLATELET  TROPONIN I    EKG  EKG Interpretation  Date/Time:  Monday Dec 27 2016 07:03:49 EDT Ventricular Rate:  67 PR Interval:    QRS Duration: 99 QT Interval:  425 QTC Calculation: 449 R Axis:   30 Text Interpretation:  Sinus rhythm No significant change was found Confirmed by Kirk Sampley  MD, Jolynn Bajorek ([id number]) on 12/27/2016 7:56:34 AM       Radiology No results found.  Procedures Procedures  (including critical care time)  Medications Ordered in ED Medications - No data to display   Initial Impression / Assessment and Plan / ED Course  I have reviewed the triage vital signs and the nursing notes.  Pertinent labs & imaging results that were available during my care of the patient were reviewed by me and considered in my medical decision making (see chart for details).     Overall well-appearing.  No clear etiology for her paresthesias noted.  No obvious rash noted.  Primary care and dermatology follow-up.  Area neck her nose will be treated as impetigo with Bactroban  Final Clinical Impressions(s) /  ED Diagnoses   Final diagnoses:  Shortness of breath  Impetigo    New Prescriptions Discharge Medication List as of 12/27/2016  8:09 AM    START taking these medications   Details  mupirocin nasal ointment (BACTROBAN) 2 % Apply in each nostril daily, Print         Jola Schmidt, MD 01/04/17 [phone]

## 2017-01-07 DIAGNOSIS — F419 Anxiety disorder, unspecified: Secondary | ICD-10-CM | POA: Diagnosis not present

## 2017-01-07 DIAGNOSIS — R5383 Other fatigue: Secondary | ICD-10-CM | POA: Diagnosis not present

## 2017-01-28 ENCOUNTER — Other Ambulatory Visit: Payer: Self-pay | Admitting: Family Medicine

## 2017-01-28 DIAGNOSIS — Z1231 Encounter for screening mammogram for malignant neoplasm of breast: Secondary | ICD-10-CM

## 2017-02-21 ENCOUNTER — Ambulatory Visit
Admission: RE | Admit: 2017-02-21 | Discharge: 2017-02-21 | Disposition: A | Discharge status: Home / Self Care | Payer: Medicare Other | Source: Ambulatory Visit | Attending: Family Medicine | Admitting: Family Medicine

## 2017-02-21 DIAGNOSIS — Z1231 Encounter for screening mammogram for malignant neoplasm of breast: Secondary | ICD-10-CM

## 2017-02-23 DIAGNOSIS — M5412 Radiculopathy, cervical region: Secondary | ICD-10-CM | POA: Diagnosis not present

## 2017-02-23 DIAGNOSIS — Z0000 Encounter for general adult medical examination without abnormal findings: Secondary | ICD-10-CM | POA: Diagnosis not present

## 2017-02-23 DIAGNOSIS — I10 Essential (primary) hypertension: Secondary | ICD-10-CM | POA: Diagnosis not present

## 2017-02-23 DIAGNOSIS — M150 Primary generalized (osteo)arthritis: Secondary | ICD-10-CM | POA: Diagnosis not present

## 2017-04-20 DIAGNOSIS — Z780 Asymptomatic menopausal state: Secondary | ICD-10-CM | POA: Diagnosis not present

## 2017-04-20 DIAGNOSIS — Z13820 Encounter for screening for osteoporosis: Secondary | ICD-10-CM | POA: Diagnosis not present

## 2017-05-23 DIAGNOSIS — E559 Vitamin D deficiency, unspecified: Secondary | ICD-10-CM | POA: Diagnosis not present

## 2017-06-15 DIAGNOSIS — M542 Cervicalgia: Secondary | ICD-10-CM | POA: Diagnosis not present

## 2017-06-15 DIAGNOSIS — M5412 Radiculopathy, cervical region: Secondary | ICD-10-CM | POA: Diagnosis not present

## 2017-06-15 DIAGNOSIS — R251 Tremor, unspecified: Secondary | ICD-10-CM | POA: Diagnosis not present

## 2017-06-15 DIAGNOSIS — Z23 Encounter for immunization: Secondary | ICD-10-CM | POA: Diagnosis not present

## 2017-06-15 DIAGNOSIS — I10 Essential (primary) hypertension: Secondary | ICD-10-CM | POA: Diagnosis not present

## 2017-07-19 DIAGNOSIS — H40053 Ocular hypertension, bilateral: Secondary | ICD-10-CM | POA: Diagnosis not present

## 2017-07-19 DIAGNOSIS — H52223 Regular astigmatism, bilateral: Secondary | ICD-10-CM | POA: Diagnosis not present

## 2017-07-19 DIAGNOSIS — H5211 Myopia, right eye: Secondary | ICD-10-CM | POA: Diagnosis not present

## 2017-07-19 DIAGNOSIS — H5202 Hypermetropia, left eye: Secondary | ICD-10-CM | POA: Diagnosis not present

## 2017-07-20 DIAGNOSIS — J019 Acute sinusitis, unspecified: Secondary | ICD-10-CM | POA: Diagnosis not present

## 2017-07-20 DIAGNOSIS — B349 Viral infection, unspecified: Secondary | ICD-10-CM | POA: Diagnosis not present

## 2017-07-29 DIAGNOSIS — H25812 Combined forms of age-related cataract, left eye: Secondary | ICD-10-CM | POA: Diagnosis not present

## 2017-07-29 DIAGNOSIS — H01001 Unspecified blepharitis right upper eyelid: Secondary | ICD-10-CM | POA: Diagnosis not present

## 2017-07-29 DIAGNOSIS — H25811 Combined forms of age-related cataract, right eye: Secondary | ICD-10-CM | POA: Diagnosis not present

## 2017-08-04 DIAGNOSIS — H25811 Combined forms of age-related cataract, right eye: Secondary | ICD-10-CM | POA: Diagnosis not present

## 2017-08-04 DIAGNOSIS — H2511 Age-related nuclear cataract, right eye: Secondary | ICD-10-CM | POA: Diagnosis not present

## 2017-08-31 ENCOUNTER — Encounter: Payer: Self-pay | Admitting: Family Medicine

## 2017-08-31 DIAGNOSIS — E7800 Pure hypercholesterolemia, unspecified: Secondary | ICD-10-CM | POA: Diagnosis not present

## 2017-08-31 DIAGNOSIS — F419 Anxiety disorder, unspecified: Secondary | ICD-10-CM | POA: Diagnosis not present

## 2017-08-31 DIAGNOSIS — R7303 Prediabetes: Secondary | ICD-10-CM | POA: Diagnosis not present

## 2017-08-31 DIAGNOSIS — I10 Essential (primary) hypertension: Secondary | ICD-10-CM | POA: Diagnosis not present

## 2017-10-19 DIAGNOSIS — E7800 Pure hypercholesterolemia, unspecified: Secondary | ICD-10-CM | POA: Diagnosis not present

## 2017-10-19 DIAGNOSIS — E1165 Type 2 diabetes mellitus with hyperglycemia: Secondary | ICD-10-CM | POA: Diagnosis not present

## 2017-10-19 DIAGNOSIS — I10 Essential (primary) hypertension: Secondary | ICD-10-CM | POA: Diagnosis not present

## 2017-11-11 ENCOUNTER — Ambulatory Visit: Payer: Medicare Other | Admitting: Registered"

## 2017-11-22 DIAGNOSIS — K5730 Diverticulosis of large intestine without perforation or abscess without bleeding: Secondary | ICD-10-CM | POA: Diagnosis not present

## 2017-11-22 DIAGNOSIS — Z1211 Encounter for screening for malignant neoplasm of colon: Secondary | ICD-10-CM | POA: Diagnosis not present

## 2017-11-22 DIAGNOSIS — K625 Hemorrhage of anus and rectum: Secondary | ICD-10-CM | POA: Diagnosis not present

## 2017-12-07 ENCOUNTER — Encounter: Payer: Medicare Other | Attending: Family Medicine | Admitting: *Deleted

## 2017-12-07 DIAGNOSIS — E119 Type 2 diabetes mellitus without complications: Secondary | ICD-10-CM | POA: Insufficient documentation

## 2017-12-07 DIAGNOSIS — Z713 Dietary counseling and surveillance: Secondary | ICD-10-CM | POA: Insufficient documentation

## 2017-12-07 NOTE — Patient Instructions (Signed)
Plan:  Aim for 2 Carb Choices per meal (30 grams) +/- 1 either way  Aim for 0-1 Carbs per snack if hungry  Include protein in moderation with your meals and snacks Consider reading food labels for Total Carbohydrate of foods Continue with your activity level by walking or other activities daily as tolerated

## 2017-12-07 NOTE — Progress Notes (Signed)
Diabetes Self-Management Education  Visit Type: First/Initial  Appt. Start Time: 0800 Appt. End Time: 0930  12/07/2017  [name], identified by name and date of birth, is a 67 y.o. female with a diagnosis of Diabetes: Type 2. Patient states she lives alone and was quite sick last year, apparently from mold inside her home, which took a long time to figure out and get rid of. She does not cook much for just one person and there is a [employer] across the street from her home. She does get some exercise at the [employer] a few days a week and enjoys the social interactions she has there. She states she does struggle with depression and it takes and effort to care about taking care of herself. She absolutely does not want to check her BG if she doesn't have too, partially due to her tremors which also make it difficult to put food in her mouth, but also an aversion to pricking her finger.   ASSESSMENT  Height 5\' 5"  (1.651 m), weight 218 lb 14.4 oz (99.3 kg). Body mass index is 36.43 kg/m.  Diabetes Self-Management Education - 12/07/17 1333      Visit Information   Visit Type  First/Initial      Initial Visit   Diabetes Type  Type 2    Are you currently following a meal plan?  Yes    What type of meal plan do you follow?  low carb, sugar free    Are you taking your medications as prescribed?  Yes    Date Diagnosed  couple months ago      Health Coping   How would you rate your overall health?  Fair      Psychosocial Assessment   Patient Belief/Attitude about Diabetes  Denial    Self-care barriers  Debilitated state due to current medical condition    Other persons present  Patient    Patient Concerns  Nutrition/Meal planning;Weight Control    Special Needs  None    Learning Readiness  Contemplating    How often do you need to have someone help you when you read instructions, pamphlets, or other written materials from your doctor or pharmacy?  1 - Never    What is the last grade  level you completed in school?  12      Pre-Education Assessment   Patient understands the diabetes disease and treatment process.  Needs Instruction    Patient understands incorporating nutritional management into lifestyle.  Needs Instruction    Patient undertands incorporating physical activity into lifestyle.  Needs Instruction    Patient understands using medications safely.  Needs Instruction    Patient understands monitoring blood glucose, interpreting and using results  Needs Instruction    Patient understands prevention, detection, and treatment of acute complications.  Needs Instruction    Patient understands prevention, detection, and treatment of chronic complications.  Needs Instruction    Patient understands how to develop strategies to address psychosocial issues.  Needs Instruction    Patient understands how to develop strategies to promote health/change behavior.  Needs Instruction      Complications   Last HgB A1C per patient/outside source  7.6 %    How often do you check your blood sugar?  0 times/day (not testing) strongly resisting the idea of pricking her fingers, partially due to tremors     Have you had a dilated eye exam in the past 12 months?  Yes    Have you  had a dental exam in the past 12 months?  Yes    Are you checking your feet?  No      Dietary Intake   Breakfast  unsweetened cereal [place]) with 1% milk, occasionally with fresh fruit    Snack (morning)  fruit cup or PNB on celery    Lunch  [employer] cafeteria meal OR salad with cheese and vegetables OR sandwich    Dinner  left overs from [employer] meal    Snack (evening)  water, fruit juice, diet soda      Exercise   Exercise Type  Light (walking / raking leaves)    How many days per week to you exercise?  3    How many minutes per day do you exercise?  60    Total minutes per week of exercise  180      Patient Education   Previous Diabetes Education  No    Disease state   Factors that contribute to the  development of diabetes    Nutrition management   Role of diet in the treatment of diabetes and the relationship between the three main macronutrients and blood glucose level;Carbohydrate counting;Food label reading, portion sizes and measuring food.    Physical activity and exercise   Role of exercise on diabetes management, blood pressure control and cardiac health.    Monitoring  Purpose and frequency of SMBG.;Identified appropriate SMBG and/or A1C goals.    Chronic complications  Relationship between chronic complications and blood glucose control      Individualized Goals (developed by patient)   Nutrition  General guidelines for healthy choices and portions discussed    Physical Activity  Exercise 3-5 times per week    Medications  take my medication as prescribed    Monitoring   Not Applicable      Post-Education Assessment   Patient understands the diabetes disease and treatment process.  Needs Review    Patient understands incorporating nutritional management into lifestyle.  Demonstrates understanding / competency    Patient undertands incorporating physical activity into lifestyle.  Demonstrates understanding / competency      Outcomes   Expected Outcomes  Demonstrated interest in learning. Expect positive outcomes    Future DMSE  PRN    Program Status  Completed       Individualized Plan for Diabetes Self-Management Training:   Learning Objective:  Patient will have a greater understanding of diabetes self-management. Patient education plan is to attend individual and/or group sessions per assessed needs and concerns.   Plan:   Patient Instructions  Plan:  Aim for 2 Carb Choices per meal (30 grams) +/- 1 either way  Aim for 0-1 Carbs per snack if hungry  Include protein in moderation with your meals and snacks Consider reading food labels for Total Carbohydrate of foods Continue with your activity level by walking or other activities daily as tolerated  Expected  Outcomes:  Demonstrated interest in learning. Expect positive outcomes  Education material provided: Living Well with Diabetes, A1C conversion sheet, Meal plan card and Carbohydrate counting sheet  If problems or questions, patient to contact team via:  Phone  Future DSME appointment: PRN

## 2017-12-13 NOTE — Progress Notes (Signed)
Subjective:   [name] was seen in consultation in the movement disorder clinic at the request of Dr. Vertell Limber.  Her PCP is Wyline Beady, MD.  The evaluation is for tremor.    The patient is a 67 y.o. right handed female with a history of tremor.  Tremor is in "arms and hands."  She states that she has had L arm tremor for 5-10 years; she notices it when using the hand.  She states that she was thinking that it was due to a pinched nerve.  She saw Dr. Erling Cruz for it and was told that it was essential tremor.  She states that she had a cervical fusion 3 years ago and when "I woke up I couldn't move the right arm."  She states that tremor in the right arm started after that.  She was tried on lyrica but states that it made tremor on the L worse.  She states that gabapentin had a similar result.  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  There is no family hx of tremor.    Affected by caffeine: unknown, as doesn't drink much Affected by alcohol:  Unknown as doesn't drink Affected by stress:  No. Affected by fatigue:  No. Spills soup if on spoon:  Yes.   Spills glass of liquid if full:  Yes.   Affects ADL's (tying shoes, brushing teeth, etc): some (notices it when pulls up pants)  02/05/15 update:  The patient returns today for follow-up.  I started her on primidone last visit.  The patient reports this is definitely helping her tremor.  She denies any side effects with the medication.  I got labs from her PCP since last visit.  They were from 06/2014 but states that she had labs few weeks ago.  She had normal HgbA1C of 5.8.  Her chemistry was normal as was ast/alt.  CBC was normal.  09/10/15 update:  The patient is following up today regarding essential tremor.  She is on primidone, 50 daily.  "It has cooled tremor down."  States that the primidone worked on the L  hand but not on the right, because she is convinced that the right is from nerve damage and "that medication doesn't help nerve damage."  I checked her TSH last visit and it was normal at 1.65.  10/24/15 update:  The patient is following up today regarding essential tremor.  She is on primidone, 50 daily.  I checked her TSH last visit and it was normal at 1.65.  She was not going to f/u here any longer but requested visit today as she states that she has "figured out" the source of her tremor and that was a "faulty furnace."  States that she had her furnace replaced a year and half ago and then a repair man came back this year to service the furnace and the repair guy suggested that she has been exposed to [place] for years.  States that this has been the source of her sickness for years.   When I asked her why the sx's didn't get better when she got her new furnace, she states that she thinks that because the sx's just last for a long time.  She has not been to her PCP to discuss this.  She admits that she is very distraught  over this and thinks that the medical community "laughs at me" when she goes to the doctor.  12/15/17 update:  Pt seen in f/u but I haven't seen her in over 2 years.  MRI completed after our last visit in 2017 and I reviewed this.  there was evidence of mod to severe white matter disease, likely due to HTN/hyperlipidemia/tobacco.  There were no basal ganglia lesions.  She was concerned about CO poisoning last visit causing tremor but I told her that this would not be.  She was on primidone and reported that it helped last visit but it is now on the allergy list as it "made symptoms worse."  She doesn't remember primidone but wonders if the mold infestation in her house was messing her up.   Reports that "tremor is driving me crazy."  Not drinking caffeine.  She thinks that stress increases tremor "a little" as does fatigue.  States that she cannot hold a glass in her hands.  She was in the ER last  year multiple times with various c/o and reported that mold and "winged cockroaches" in the home were a concern as to being etiology for medical issues.  Reports today that mold under her house caused many of her issues.  She also thought that she "may have the plague."  Ultimately, it was felt depression was an issue.  Outside reports reviewed: historical medical records.  Allergies  Allergen Reactions  . Lyrica [Pregabalin] Other (See Comments)    Made symptoms worse  . Primidone Other (See Comments)    Made symptoms worse  . Simvastatin     Other reaction(s): myalgias  . Statins Other (See Comments)    Myalgias  . Meloxicam Other (See Comments)    Makes her sweat and muscle cramps     Outpatient Encounter Medications as of 12/15/2017  Medication Sig  . acetaminophen (TYLENOL) 500 MG tablet Take 500 mg by mouth every 6 (six) hours as needed for mild pain, moderate pain, fever or headache.  . ALPRAZolam (XANAX) 0.5 MG tablet Take 0.25 tablets by mouth every 6 (six) hours as needed for anxiety or sleep (patient reports taking about twice a week). anxiety  . calcium carbonate (OS-CAL - DOSED IN MG OF ELEMENTAL CALCIUM) 1250 (500 Ca) MG tablet Take 1 tablet by mouth 3 (three) times a week.  . ferrous sulfate 325 (65 FE) MG tablet Take 325 mg by mouth 3 (three) times a week.  . hydrochlorothiazide (HYDRODIURIL) 12.5 MG tablet Take 6.25 mg by mouth daily with breakfast.   . HYDROcodone-acetaminophen (NORCO) 5-325 MG tablet Take 1-2 tablets by mouth every 6 (six) hours as needed. (Patient taking differently: Take 1-2 tablets by mouth every 6 (six) hours as needed for moderate pain. )  . metFORMIN (GLUCOPHAGE) 500 MG tablet Take 500 mg by mouth daily with breakfast.  . Vitamin D, Ergocalciferol, (DRISDOL) 50000 UNITS CAPS capsule Take 1 capsule by mouth once a week. Takes on Saturday or Sunday  . [DISCONTINUED] mupirocin nasal ointment (BACTROBAN) 2 % Apply in each nostril daily  .  [DISCONTINUED] valsartan (DIOVAN) 40 MG tablet Take 40 mg by mouth daily with breakfast.    No facility-administered encounter medications on file as of 12/15/2017.     Past Medical History:  Diagnosis Date  . Arthritis   . Cervical spinal stenosis   . Complication of anesthesia    "makes me cry for days."  . Diverticulitis   . Family history of anesthesia complication  Sister has nausea and vomitting.  Marland Kitchen Hypercholesteremia   . Hypertension     Past Surgical History:  Procedure Laterality Date  . ABDOMINAL HYSTERECTOMY  1995  . ANTERIOR CERVICAL DECOMP/DISCECTOMY FUSION  12/30/11  . BACK SURGERY  1992   . COLECTOMY  2004   "lost 1/3 of my colon"  . THORACIC FUSION  1992  . TUMOR REMOVAL     small intestine    Social History   Socioeconomic History  . Marital status: Divorced    Spouse name: Not on file  . Number of children: Not on file  . Years of education: Not on file  . Highest education level: Not on file  Occupational History  . Occupation: retired    Comment: Press photographer, computer  Social Needs  . Financial resource strain: Not on file  . Food insecurity:    Worry: Not on file    Inability: Not on file  . Transportation needs:    Medical: Not on file    Non-medical: Not on file  Tobacco Use  . Smoking status: Current Every Day Smoker    Packs/day: 1.00    Years: 40.00    Pack years: 40.00    Types: Cigarettes  . Smokeless tobacco: Never Used  Substance and Sexual Activity  . Alcohol use: No    Alcohol/week: 0.0 oz  . Drug use: No  . Sexual activity: Never  Lifestyle  . Physical activity:    Days per week: Not on file    Minutes per session: Not on file  . Stress: Not on file  Relationships  . Social connections:    Talks on phone: Not on file    Gets together: Not on file    Attends religious service: Not on file    Active member of club or organization: Not on file    Attends meetings of clubs or organizations: Not on file    Relationship  status: Not on file  . Intimate partner violence:    Fear of current or ex partner: Not on file    Emotionally abused: Not on file    Physically abused: Not on file    Forced sexual activity: Not on file  Other Topics Concern  . Not on file  Social History Narrative  . Not on file    Family Status  Relation Name Status  . Mother  Deceased       healthy   . Father  Deceased       healthy  . Sister  Alive       healthy  . Son  Alive       healthy    Review of Systems A complete 10 system ROS was obtained and was negative apart from what is mentioned.   Objective:   VITALS:   Vitals:   12/15/17 0801  BP: 92/62  Pulse: 86  SpO2: 96%  Weight: 215 lb (97.5 kg)  Height: 5\' 5"  (1.651 m)   GEN:  The patient appears stated age and is in NAD. HEENT:  Normocephalic, atraumatic.  The mucous membranes are moist. The superficial temporal arteries are without ropiness or tenderness. CV:  RRR Lungs:  CTAB Neck/HEME:  There are no carotid bruits bilaterally.  NEUROLOGICAL:  Orientation:  The patient is alert and oriented x 3.  Cranial nerves: There is good facial symmetry.  Speech has stuttering quality that waxes and wanes in visit, starts off prominent initally and then abates. Soft palate rises symmetrically and  there is no tongue deviation. Hearing is intact to conversational tone. Tone: Tone is good throughout. Sensation: Sensation is intact to light touch throughout Coordination:  The patient has no dysdiadichokinesia or dysmetria.  Motor: Strength is 5/5 in the UE/LE Gait and Station: The patient is able to ambulate without difficulty.  MOVEMENT EXAM: Tremor:  There is tremor in the UE, noted most significantly with action.  Continue to be nonphysiologic aspects to the tremor.  When holding out the arms and asked to tap her rhythm with the opposite hand, the tremor matches the frequency of the rhythm.  It is a very different frequency than when she holds a cup and tries to  drink water, at which time tremor is high amplitude.    LABS    Chemistry      Component Value Date/Time   NA 137 01/01/2017 1056   K 3.0 (L) 01/01/2017 1056   CL 106 01/01/2017 1056   CO2 23 01/01/2017 1056   BUN 15 01/01/2017 1056   CREATININE 0.96 01/01/2017 1056      Component Value Date/Time   CALCIUM 9.1 01/01/2017 1056   ALKPHOS 43 01/01/2017 1056   AST 29 01/01/2017 1056   ALT 28 01/01/2017 1056   BILITOT 0.8 01/01/2017 1056          Assessment/Plan:   1.   Tremor.  -could have some ET but suspect functional is issue.  Reported last visit that primidone helped but now on allergy list due to it making sx's worse.  Explained that I am not sure that this medication can make tremor worse.  Also, she was on primidone for about 7 months without trouble.  She admits that it likely wasn't the primidone and she "had mold" issues.  She would like to retry primidone, 50 mg q hs.    She is not a candidate for beta blocker given low BP.  Explained that these are the only 2 drugs top line for tremor.  -discussed with the patient that mechanisms to control stress be of value.  Discussed counseling.  Discussed alternative issues such as acupuncture and biofeedback.  She is only interested in acupuncture and was given the information for integrative therapies.  -Patient insists again today that tremor is from a pinched nerve in the neck.  Explained to her that this is just not physiologic. 2.  Follow up is anticipated in the next 4 months, sooner should new neurologic issues arise.  Much greater than 50% of this visit was spent in counseling and coordinating care.  Total face to face time:  25 min

## 2017-12-15 ENCOUNTER — Encounter: Payer: Self-pay | Admitting: Neurology

## 2017-12-15 ENCOUNTER — Ambulatory Visit: Payer: Medicare Other | Admitting: Neurology

## 2017-12-15 VITALS — BP 92/62 | HR 86 | Ht 65.0 in | Wt 215.0 lb

## 2017-12-15 DIAGNOSIS — R251 Tremor, unspecified: Secondary | ICD-10-CM

## 2017-12-15 MED ORDER — PRIMIDONE 50 MG PO TABS: 50.0000 mg | ORAL_TABLET | Freq: Every day | ORAL | 4 refills | Status: DC

## 2017-12-15 NOTE — Patient Instructions (Signed)
Integrative therapies:  You can call them and ask about the accupuncture  Address: [address], [place], [place] [address] Phone: [phone]  Start primidone - 50 mg - 1/2 tablet at night for 4 nights and then 1 tablet at bedtime

## 2017-12-16 DIAGNOSIS — I10 Essential (primary) hypertension: Secondary | ICD-10-CM | POA: Diagnosis not present

## 2017-12-16 DIAGNOSIS — E7800 Pure hypercholesterolemia, unspecified: Secondary | ICD-10-CM | POA: Diagnosis not present

## 2017-12-16 DIAGNOSIS — E1165 Type 2 diabetes mellitus with hyperglycemia: Secondary | ICD-10-CM | POA: Diagnosis not present

## 2017-12-16 DIAGNOSIS — G250 Essential tremor: Secondary | ICD-10-CM | POA: Diagnosis not present

## 2018-01-18 DIAGNOSIS — M19019 Primary osteoarthritis, unspecified shoulder: Secondary | ICD-10-CM | POA: Diagnosis not present

## 2018-01-18 DIAGNOSIS — R251 Tremor, unspecified: Secondary | ICD-10-CM | POA: Diagnosis not present

## 2018-01-18 DIAGNOSIS — M542 Cervicalgia: Secondary | ICD-10-CM | POA: Diagnosis not present

## 2018-01-18 DIAGNOSIS — M47812 Spondylosis without myelopathy or radiculopathy, cervical region: Secondary | ICD-10-CM | POA: Diagnosis not present

## 2018-03-24 DIAGNOSIS — J069 Acute upper respiratory infection, unspecified: Secondary | ICD-10-CM | POA: Diagnosis not present

## 2018-03-24 DIAGNOSIS — J45909 Unspecified asthma, uncomplicated: Secondary | ICD-10-CM | POA: Diagnosis not present

## 2018-03-24 DIAGNOSIS — F4320 Adjustment disorder, unspecified: Secondary | ICD-10-CM | POA: Diagnosis not present

## 2018-04-21 ENCOUNTER — Ambulatory Visit: Payer: Medicare Other | Admitting: Neurology

## 2018-04-26 DIAGNOSIS — R61 Generalized hyperhidrosis: Secondary | ICD-10-CM | POA: Diagnosis not present

## 2018-04-26 DIAGNOSIS — L509 Urticaria, unspecified: Secondary | ICD-10-CM | POA: Diagnosis not present

## 2018-04-26 DIAGNOSIS — R5383 Other fatigue: Secondary | ICD-10-CM | POA: Diagnosis not present

## 2018-05-04 ENCOUNTER — Ambulatory Visit: Payer: Medicare Other | Admitting: Allergy & Immunology

## 2018-05-04 ENCOUNTER — Encounter: Payer: Self-pay | Admitting: Allergy & Immunology

## 2018-05-04 VITALS — BP 122/72 | HR 92 | Temp 99.1°F | Resp 20 | Ht 65.5 in | Wt 205.6 lb

## 2018-05-04 DIAGNOSIS — R5383 Other fatigue: Secondary | ICD-10-CM

## 2018-05-04 DIAGNOSIS — R21 Rash and other nonspecific skin eruption: Secondary | ICD-10-CM | POA: Diagnosis not present

## 2018-05-04 NOTE — Patient Instructions (Addendum)
1. Fatigue with rash - Your histamine was non reactive today, so we could not do skin testing. - We will get blood testing for allergies instead. - Your symptoms do not seem consistent with allergies since there is no itching involved and there are no rhinitis (nasal) symptoms at all. - We will call you in 1-2 weeks with the results of the testing. - In the meantime, restart the Zyrtec (cetirizine). - This CAN be taken with the Singulair (montelukast). - I would highly recommend that you get your crawlspace inspected as you are doing. - We will also get thyroid function to make sure that these levels are adequate.   2. Return in about 3 months (around 08/03/2018).   Please inform us of any Emergency Department visits, hospitalizations, or changes in symptoms. Call us before going to the ED for breathing or allergy symptoms since we might be able to fit you in for a sick visit. Feel free to contact us anytime with any questions, problems, or concerns.  It was a pleasure to meet you today!  Websites that have reliable patient information: 1. American Academy of Asthma, Allergy, and Immunology: www.aaaai.org 2. Food Allergy Research and Education (FARE): foodallergy.org 3. Mothers of Asthmatics: http://www.asthmacommunitynetwork.org 4. American College of Allergy, Asthma, and Immunology: MonthlyElectricBill.co.uk   Make sure you are registered to vote! If you have moved or changed any of your contact information, you will need to get this updated before voting!

## 2018-05-04 NOTE — Progress Notes (Signed)
NEW PATIENT  Date of Service/Encounter:  05/04/18  Referring provider: Wyline Beady, MD   Assessment:   Fatigue  Rash - unknown etiology    Zainab presents for a constellation of fairly vague symptoms as well as a rash that appears more consistent with bruising.  She has no history of allergic rhinitis symptoms and her current rash is not pruritic and certainly not urticarial.  Confusing the picture is the fact that it evidently does get better with the use of Zyrtec.  She is certainly a difficult person to read.  In any case, we cannot do testing since her histamine was nonreactive today.  We are going to get an environmental allergy panel as well as an extended mold panel to see if she showed sensitizations to molds or other environmental allergens.  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  I think this will be answered with the current evaluation that is being done in her home, particularly in the crawlspace.  Due to the fatigue, I will get some thyroid function testing.  I do not think a rheumatologic work-up is useful at this point since her inflammatory markers have been negative in the past.  We will follow-up after the labs and provide any kind of guidance pending the results.   Plan/Recommendations:   1. Fatigue with rash - Your histamine was non reactive today, so we could not do skin testing. - We will get blood testing for allergies instead. - Your symptoms do not seem consistent with allergies since there is no itching involved and there are no rhinitis (nasal) symptoms at all. - We will call you in 1-2 weeks with the results of the testing. - In the meantime, restart the Zyrtec (cetirizine). - This CAN be taken with the Singulair (montelukast). - I would highly recommend that you get your crawlspace inspected as you are doing. - We will also get thyroid function to make sure that these  levels are adequate.   2. Return in about 3 months (around 08/03/2018).  Subjective:   LEONARDO PLAIA is a 67 y.o. female presenting today for evaluation of  Chief Complaint  Patient presents with  . New Patient (Initial Visit)  . Bruises    x 2 weeks     SHANDIIN EISENBEIS has a history of the following: Patient Active Problem List   Diagnosis Date Noted  . Vitamin D deficiency 01/02/2016  . Arthropathy 01/02/2016  . Tremor 01/02/2016  . Primary generalized (osteo)arthritis 01/02/2016  . Cigarette nicotine dependence, uncomplicated 63/84/6659  . Female climacteric state 01/02/2016  . Hyperglycemia 01/02/2016  . Essential (primary) hypertension 01/02/2016  . Elevated white blood cell count 01/02/2016  . 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 Cervicalgia 01/02/2016  . Radiculopathy of cervical region 01/02/2016  . Essential tremor 01/02/2016  . Anxiety disorder 01/02/2016  . Pure hypercholesterolemia 01/02/2016  . Weakness 01/02/2016  . Fatigue 01/02/2016  . HLD (hyperlipidemia) 01/02/2016  . Body mass index (BMI) of 35.0-35.9 in adult 08/28/2014    History obtained from: chart review and patient.  MILISA KIMBELL was referred by Wyline Beady, MD.     Athelene is a 67 y.o. female presenting for an evaluation of a rash. They are located on her arms bilaterally and appear like bruising. They started a few weeks ago. They are not itchy. They emerged and then never changed at  all. She just feels "lousy" and weak and tired all of the time. She does "much run a fever". She feels that she needs to lay down. She denies any breathing problems as well as a cough.  She never had seasonal allergies growing up.  She denies sneezing, nasal congestion, and itchy watery eyes.  She has never been allergy tested in the past.  She was started on montelukast 55m daily two days ago. She had labs drawn that showed a normal CBC, CMP, and ESR. Everything  was normal. She did get a DepoMedrol injection which helped a "little bit".  I do not see any kind of thyrid studies in there.  She has no history of autoimmune disease.  This is certainly not her baseline, as she says she typically has more energy. Her PCP notes mention adjustment disorder, but she denies any social problems at this time. [place]  One year ago, there was mold discovered under her home. She spent some money to clean it and remove it. This is the only new exposure. She did feel bad one year ago with the mold. She did have people come out to check to see if the mold was back, and it seems that they are currently doing another investigation including spore counts to see if the mold is returning.  Otherwise, there is no history of other atopic diseases, including asthma, food allergies, drug allergies, stinging insect allergies, eczema or urticaria. There is no significant infectious history. Vaccinations are up to date.    Past Medical History: Patient Active Problem List   Diagnosis Date Noted  . Vitamin D deficiency 01/02/2016  . Arthropathy 01/02/2016  . Tremor 01/02/2016  . Primary generalized (osteo)arthritis 01/02/2016  . Cigarette nicotine dependence, uncomplicated 094/17/4081 . Female climacteric state 01/02/2016  . Hyperglycemia 01/02/2016  . Essential (primary) hypertension 01/02/2016  . Elevated white blood cell count 01/02/2016  . 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 Cervicalgia 01/02/2016  . Radiculopathy of cervical region 01/02/2016  . Essential tremor 01/02/2016  . Anxiety disorder 01/02/2016  . Pure hypercholesterolemia 01/02/2016  . Weakness 01/02/2016  . Fatigue 01/02/2016  . HLD (hyperlipidemia) 01/02/2016  . Body mass index (BMI) of 35.0-35.9 in adult 08/28/2014    Medication List:  Allergies as of 05/04/2018      Reactions   Lyrica [pregabalin] Other (See Comments)    Made symptoms worse   Simvastatin    Other reaction(s): myalgias   Statins Other (See Comments)   Myalgias   Meloxicam Other (See Comments)   Makes her sweat and muscle cramps       Medication List        Accurate as of 05/04/18 10:05 AM. Always use your most recent med list.          acetaminophen 500 MG tablet Commonly known as:  TYLENOL Take 500 mg by mouth every 6 (six) hours as needed for mild pain, moderate pain, fever or headache.   ALPRAZolam 0.5 MG tablet Commonly known as:  XANAX Take 0.25 tablets by mouth every 6 (six) hours as needed for anxiety or sleep (patient reports taking about twice a week). anxiety   calcium carbonate 1250 (500 Ca) MG tablet Commonly known as:  OS-CAL - dosed in mg of elemental calcium Take 1 tablet by mouth 3 (three) times a week.   ferrous sulfate 325 (65 FE) MG tablet Take 325 mg by mouth 3 (  three) times a week.   HYDROcodone-acetaminophen 5-325 MG tablet Commonly known as:  NORCO/VICODIN Take 1-2 tablets by mouth every 6 (six) hours as needed.   losartan 25 MG tablet Commonly known as:  COZAAR TK 1 T PO QD   montelukast 10 MG tablet Commonly known as:  SINGULAIR TK 1 T PO QD PRF ALLERGIES OR ASTHMA   primidone 50 MG tablet Commonly known as:  MYSOLINE Take 1 tablet (50 mg total) by mouth at bedtime.   Vitamin D (Ergocalciferol) 50000 units Caps capsule Commonly known as:  DRISDOL Take 1 capsule by mouth once a week. Takes on Saturday or Sunday       Birth History: non-contributory  Developmental History: non-contributory.   Past Surgical History: Past Surgical History:  Procedure Laterality Date  . ABDOMINAL HYSTERECTOMY  1995  . ANTERIOR CERVICAL DECOMP/DISCECTOMY FUSION  12/30/11  . BACK SURGERY  1992   . COLECTOMY  2004   "lost 1/3 of my colon"  . THORACIC FUSION  1992  . TUMOR REMOVAL     small intestine     Family History: Family History  Problem Relation Age of Onset  . Anesthesia problems Sister    . Allergic rhinitis Sister   . Food Allergy Sister        shellfish  . Angioedema Neg Hx   . Asthma Neg Hx   . Eczema Neg Hx   . Urticaria Neg Hx      Social History: Shenaya lives at home with her cat of 12 years.  Her house is around 27 years old.  There is wood flooring throughout the home.  He has gas heating and central cooling.  There are no dust mite covers on the bedding.  There is tobacco exposure in the home.  She is retired previously worked at [employer] for approximately 50 years.    Review of Systems: a 14-point review of systems is pertinent for what is mentioned in HPI.  Otherwise, all other systems were negative. Constitutional: negative other than that listed in the HPI Eyes: negative other than that listed in the HPI Ears, nose, mouth, throat, and face: negative other than that listed in the HPI Respiratory: negative other than that listed in the HPI Cardiovascular: negative other than that listed in the HPI Gastrointestinal: negative other than that listed in the HPI Genitourinary: negative other than that listed in the HPI Integument: negative other than that listed in the HPI Hematologic: negative other than that listed in the HPI Musculoskeletal: negative other than that listed in the HPI Neurological: negative other than that listed in the HPI Allergy/Immunologic: negative other than that listed in the HPI    Objective:   Blood pressure 122/72, pulse 92, temperature 99.1 F (37.3 C), temperature source Oral, resp. rate 20, height 5' 5.5" (1.664 m), weight 205 lb 9.6 oz (93.3 kg). Body mass index is 33.69 kg/m.   Physical Exam:  General: Alert, interactive, in no acute distress.  Pleasant female, but somewhat defeated appearing at time.  Eyes: No conjunctival injection bilaterally, no discharge on the right, no discharge on the left and no Horner-Trantas dots present. PERRL bilaterally. EOMI without pain. No photophobia.  Ears: Right TM pearly gray  with normal light reflex, Left TM pearly gray with normal light reflex, Right TM intact without perforation and Left TM intact without perforation.  Nose/Throat: External nose within normal limits and septum midline. Turbinates edematous with clear discharge. Posterior oropharynx erythematous with cobblestoning in the posterior oropharynx.  Tonsils 2+ without exudates.  Tongue without thrush. Neck: Supple without thyromegaly. Trachea midline. Adenopathy: no enlarged lymph nodes appreciated in the anterior cervical, occipital, axillary, epitrochlear, inguinal, or popliteal regions. Lungs: Clear to auscultation without wheezing, rhonchi or rales. No increased work of breathing. CV: Normal S1/S2. No murmurs. Capillary refill <2 seconds.  Abdomen: Nondistended, nontender. No guarding or rebound tenderness. Bowel sounds faint and hypoactive  Skin: Warm and dry, without lesions or rashes. There are a couple of isolated lesions on the bilateral arms  Extremities:  No clubbing, cyanosis or edema. Neuro:   Grossly intact. No focal deficits appreciated. Responsive to questions.       Diagnostic studies: deferred due to recent antihistamine use (histamine was non-reactive)       Salvatore Marvel, MD All[facility] and [place] [facility]

## 2018-05-08 ENCOUNTER — Telehealth: Payer: Self-pay

## 2018-05-08 NOTE — Telephone Encounter (Signed)
Patient called back with questions regarding her lab results. Patient states that she has been sick for quite some time. She understands that the labs showed a mold allergy. She wanted to know if she needed an antifungal because she believes that the mold is inside of her. I explained that it was not that kind of mold. I let her know that she did not have a fungal/mold infection. She just needs to clean her home of the allergen and to take all medications as prescribed. She says that we have got to help her feel better. I let her know that I would send the message to Dr. Ernst Bowler and see what other recommendations there were. I did let her know that it may be tomorrow before she hears from me.

## 2018-05-09 LAB — ALLERGEN PROFILE, MOLD
M005-IGE CANDIDA ALBICANS: 2.38 kU/L — AB
M009-IgE Fusarium proliferatum: <0.1 kU/L
M014-IgE Epicoccum purpur: <0.1 kU/L

## 2018-05-09 LAB — IGE+ALLERGENS ZONE 2(30)
Alternaria Alternata IgE: <0.1 kU/L
Amer Sycamore IgE Qn: <0.1 kU/L
Aspergillus Fumigatus IgE: <0.1 kU/L
Bahia Grass IgE: <0.1 kU/L
Bermuda Grass IgE: <0.1 kU/L
Cat Dander IgE: <0.1 kU/L
Cedar, Mountain IgE: <0.1 kU/L
Cladosporium Herbarum IgE: <0.1 kU/L
Common Silver Birch IgE: <0.1 kU/L
D Farinae IgE: 0.14 kU/L — AB
D001-IGE D PTERONYSSINUS: 0.26 kU/L — AB
Dog Dander IgE: <0.1 kU/L
Elm, American IgE: <0.1 kU/L
IGE (IMMUNOGLOBULIN E), SERUM: 52 [IU]/mL (ref 6–495)
Johnson Grass IgE: <0.1 kU/L
Mucor Racemosus IgE: <0.1 kU/L
Mugwort IgE Qn: <0.1 kU/L
Pigweed, Rough IgE: <0.1 kU/L
Ragweed, Short IgE: <0.1 kU/L
Stemphylium Herbarum IgE: <0.1 kU/L
Sweet gum IgE RAST Ql: <0.1 kU/L
Timothy Grass IgE: <0.1 kU/L
White Mulberry IgE: <0.1 kU/L

## 2018-05-09 LAB — TSH+T3+FREE T4+T3 FREE
FREE T4 BY DIALYSIS: 1.2 ng/dL
Free T-3: 2.9 pg/mL
TSH: 2.7 uU/mL
Triiodothyronine (T-3), Serum: 108 ng/dL

## 2018-05-16 NOTE — Telephone Encounter (Signed)
Spoke with patient advised as written patient verbalized understanding. Patient states she feels terrible and is also waiting on the "mold people " to come to her home states is there something that can be prescribed that is stronger please advise

## 2018-05-16 NOTE — Telephone Encounter (Signed)
Reviewed note. There is no indication for an antifungal at this time. 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 We could do an immune workup to look for immunodeficiencies, but I am not sure how helpful that would be. Please see how she is feeling now.   Salvatore Marvel, MD All[facility] and [place] [facility] [place]

## 2018-05-18 MED ORDER — MUPIROCIN 2 % EX OINT: 1.0000 "application " | TOPICAL_OINTMENT | Freq: Two times a day (BID) | CUTANEOUS | 0 refills | Status: DC

## 2018-05-18 NOTE — Telephone Encounter (Signed)
That is fine.   Salvatore Marvel, MD [facility] and [place] [facility] [place]

## 2018-05-18 NOTE — Telephone Encounter (Signed)
Could add on Allegra in the morning to see if this can complement her current regimen.  She could also try using nasal saline rinses 1-2 times daily and adding on a nasal steroid such as fluticasone or Nasacort.  Salvatore Marvel, MD [facility] [place]

## 2018-05-18 NOTE — Telephone Encounter (Signed)
Called and left message for patient to call office in regards to this matter. 

## 2018-05-18 NOTE — Telephone Encounter (Signed)
Patient called back and I inform patient of Dr. Gillermina Hu recommendation and she stated she will give that a try. She also stated she has used Mupirocin 2% in her nose in the past and was wanting to know if she could use that and if we could send in a Rx. Please advise.

## 2018-05-18 NOTE — Addendum Note (Signed)
Addended by: Herbie Drape on: 05/18/2018 04:59 PM   Modules accepted: Orders

## 2018-05-18 NOTE — Telephone Encounter (Signed)
Called and informed patient that I have sent in Rx.

## 2018-05-29 DIAGNOSIS — Z0000 Encounter for general adult medical examination without abnormal findings: Secondary | ICD-10-CM | POA: Diagnosis not present

## 2018-05-29 DIAGNOSIS — E7800 Pure hypercholesterolemia, unspecified: Secondary | ICD-10-CM | POA: Diagnosis not present

## 2018-05-29 DIAGNOSIS — E1165 Type 2 diabetes mellitus with hyperglycemia: Secondary | ICD-10-CM | POA: Diagnosis not present

## 2018-05-29 DIAGNOSIS — I10 Essential (primary) hypertension: Secondary | ICD-10-CM | POA: Diagnosis not present

## 2018-07-03 DIAGNOSIS — L218 Other seborrheic dermatitis: Secondary | ICD-10-CM | POA: Diagnosis not present

## 2018-07-12 ENCOUNTER — Emergency Department (HOSPITAL_COMMUNITY)
Admission: EM | Admit: 2018-07-12 | Discharge: 2018-07-12 | Disposition: A | Discharge status: Home / Self Care | Payer: Medicare Other | Attending: Emergency Medicine | Admitting: Emergency Medicine

## 2018-07-12 ENCOUNTER — Encounter (HOSPITAL_COMMUNITY): Payer: Self-pay | Admitting: Family Medicine

## 2018-07-12 ENCOUNTER — Emergency Department (HOSPITAL_COMMUNITY): Payer: Medicare Other

## 2018-07-12 DIAGNOSIS — R1032 Left lower quadrant pain: Secondary | ICD-10-CM | POA: Insufficient documentation

## 2018-07-12 DIAGNOSIS — Z79899 Other long term (current) drug therapy: Secondary | ICD-10-CM | POA: Insufficient documentation

## 2018-07-12 DIAGNOSIS — F17210 Nicotine dependence, cigarettes, uncomplicated: Secondary | ICD-10-CM | POA: Diagnosis not present

## 2018-07-12 DIAGNOSIS — I10 Essential (primary) hypertension: Secondary | ICD-10-CM | POA: Diagnosis not present

## 2018-07-12 DIAGNOSIS — K439 Ventral hernia without obstruction or gangrene: Secondary | ICD-10-CM | POA: Diagnosis not present

## 2018-07-12 DIAGNOSIS — R110 Nausea: Secondary | ICD-10-CM | POA: Insufficient documentation

## 2018-07-12 LAB — COMPREHENSIVE METABOLIC PANEL
ALT: 20 U/L (ref 0–44)
AST: 18 U/L (ref 15–41)
Albumin: 3.9 g/dL (ref 3.5–5.0)
Alkaline Phosphatase: 47 U/L (ref 38–126)
Anion gap: 6 (ref 5–15)
BUN: 18 mg/dL (ref 8–23)
CO2: 27 mmol/L (ref 22–32)
Calcium: 9 mg/dL (ref 8.9–10.3)
Chloride: 105 mmol/L (ref 98–111)
Creatinine, Ser: 0.89 mg/dL (ref 0.44–1.00)
GFR calc Af Amer: >60 mL/min (ref >60)
GFR calc non Af Amer: >60 mL/min (ref >60)
Glucose, Bld: 94 mg/dL (ref 70–99)
Potassium: 3.7 mmol/L (ref 3.5–5.1)
Sodium: 138 mmol/L (ref 135–145)
Total Bilirubin: 1.2 mg/dL (ref 0.3–1.2)
Total Protein: 8.5 g/dL — ABNORMAL HIGH (ref 6.5–8.1)

## 2018-07-12 LAB — URINALYSIS, ROUTINE W REFLEX MICROSCOPIC
Bilirubin Urine: NEGATIVE
Glucose, UA: NEGATIVE mg/dL
Hgb urine dipstick: NEGATIVE
Ketones, ur: NEGATIVE mg/dL
Leukocytes, UA: NEGATIVE
Nitrite: NEGATIVE
Protein, ur: NEGATIVE mg/dL
Specific Gravity, Urine: 1.032 — ABNORMAL HIGH (ref 1.005–1.030)
pH: 7 (ref 5.0–8.0)

## 2018-07-12 LAB — CBC WITH DIFFERENTIAL/PLATELET
Abs Immature Granulocytes: 0.04 10*3/uL (ref 0.00–0.07)
Basophils Absolute: 0.1 10*3/uL (ref 0.0–0.1)
Basophils Relative: 1 %
Eosinophils Absolute: 0.1 10*3/uL (ref 0.0–0.5)
Eosinophils Relative: 1 %
HCT: 43.2 % (ref 36.0–46.0)
Hemoglobin: 14 g/dL (ref 12.0–15.0)
Immature Granulocytes: 0 %
Lymphocytes Relative: 34 %
Lymphs Abs: 3.8 10*3/uL (ref 0.7–4.0)
MCH: 32 pg (ref 26.0–34.0)
MCHC: 32.4 g/dL (ref 30.0–36.0)
MCV: 98.9 fL (ref 80.0–100.0)
Monocytes Absolute: 0.7 10*3/uL (ref 0.1–1.0)
Monocytes Relative: 6 %
Neutro Abs: 6.5 10*3/uL (ref 1.7–7.7)
Neutrophils Relative %: 58 %
Platelets: 287 10*3/uL (ref 150–400)
RBC: 4.37 MIL/uL (ref 3.87–5.11)
RDW: 13.9 % (ref 11.5–15.5)
WBC: 11.2 10*3/uL — ABNORMAL HIGH (ref 4.0–10.5)
nRBC: 0 % (ref 0.0–0.2)

## 2018-07-12 LAB — LIPASE, BLOOD: Lipase: 18 U/L (ref 11–51)

## 2018-07-12 MED ORDER — IOPAMIDOL (ISOVUE-300) INJECTION 61%
100.0000 mL | Freq: Once | INTRAVENOUS | Status: AC | PRN
  Administered 2018-07-12: 100 mL via INTRAVENOUS

## 2018-07-12 MED ORDER — MORPHINE SULFATE (PF) 4 MG/ML IV SOLN
4.0000 mg | Freq: Once | INTRAVENOUS | Status: AC
  Administered 2018-07-12: 4 mg via INTRAVENOUS
  Filled 2018-07-12: qty 1

## 2018-07-12 MED ORDER — SODIUM CHLORIDE 0.9 % IV BOLUS
1000.0000 mL | Freq: Once | INTRAVENOUS | Status: AC
Start: 2018-07-12 — End: 2018-07-12
  Administered 2018-07-12: 1000 mL via INTRAVENOUS

## 2018-07-12 MED ORDER — CIPROFLOXACIN HCL 500 MG PO TABS: 500.0000 mg | ORAL_TABLET | Freq: Two times a day (BID) | ORAL | 0 refills | Status: DC

## 2018-07-12 MED ORDER — METRONIDAZOLE 500 MG PO TABS: 500.0000 mg | ORAL_TABLET | Freq: Two times a day (BID) | ORAL | 0 refills | Status: DC

## 2018-07-12 MED ORDER — TRAMADOL HCL 50 MG PO TABS: 50.0000 mg | ORAL_TABLET | Freq: Four times a day (QID) | ORAL | 0 refills | Status: DC | PRN

## 2018-07-12 MED ORDER — IOPAMIDOL (ISOVUE-300) INJECTION 61%
INTRAVENOUS | Status: AC
  Filled 2018-07-12: qty 100

## 2018-07-12 MED ORDER — SODIUM CHLORIDE (PF) 0.9 % IJ SOLN
INTRAMUSCULAR | Status: AC
  Filled 2018-07-12: qty 50

## 2018-07-12 NOTE — ED Notes (Signed)
Discharge instructions reviewed with patient. Patient verbalizes understanding. VSS.  Patient reports she is very unhappy with how much pain she is still in. Patient given prescriptions for pain medication and instructed on where to pick up her medications. Patient very anxious and states "yall just need to let me GO!"

## 2018-07-12 NOTE — Discharge Instructions (Addendum)
Return here as needed.  Follow-up with your GI doctor °

## 2018-07-12 NOTE — ED Triage Notes (Signed)
Patient is complaining of left lower abd pain that started about a week ago. Associated symptoms are nausea and diarrhea. Patient reports this feels like diverticulitis.

## 2018-07-12 NOTE — ED Notes (Signed)
MD Zammit at bedside. 

## 2018-07-13 ENCOUNTER — Encounter (HOSPITAL_COMMUNITY): Payer: Self-pay | Admitting: Emergency Medicine

## 2018-07-13 ENCOUNTER — Emergency Department (HOSPITAL_COMMUNITY)
Admission: EM | Admit: 2018-07-13 | Discharge: 2018-07-13 | Disposition: A | Discharge status: Home / Self Care | Payer: Medicare Other | Attending: Emergency Medicine | Admitting: Emergency Medicine

## 2018-07-13 DIAGNOSIS — L723 Sebaceous cyst: Secondary | ICD-10-CM

## 2018-07-13 DIAGNOSIS — I10 Essential (primary) hypertension: Secondary | ICD-10-CM | POA: Diagnosis not present

## 2018-07-13 DIAGNOSIS — F17210 Nicotine dependence, cigarettes, uncomplicated: Secondary | ICD-10-CM | POA: Diagnosis not present

## 2018-07-13 DIAGNOSIS — Z79899 Other long term (current) drug therapy: Secondary | ICD-10-CM | POA: Insufficient documentation

## 2018-07-13 DIAGNOSIS — R222 Localized swelling, mass and lump, trunk: Secondary | ICD-10-CM | POA: Diagnosis present

## 2018-07-13 LAB — URINE CULTURE

## 2018-07-13 MED ORDER — ONDANSETRON 4 MG PO TBDP: 4.0000 mg | ORAL_TABLET | Freq: Three times a day (TID) | ORAL | 0 refills | Status: DC | PRN

## 2018-07-13 NOTE — ED Provider Notes (Signed)
[facility] DEPT Provider Note   CSN: [id number] Arrival date & time: 07/13/18  7858     History   Chief Complaint Chief Complaint  Patient presents with  . spot on back    HPI [name] is a 67 y.o. female.  HPI   [name] is a 67 y.o. female, with a history of diverticulitis, presenting to the ED with a bump on her back present for at least the last year. She states, "I think it is due to a parasite of some sort or may be a spider bite."  She has an appointment with a dermatologist in 2 weeks.  Patient also talks about having symptoms of generalized weakness, loss of appetite, and weight loss over the last year.  She adds she she has brought these concerns to her PCP.  Denies pain to the area, swelling, fever, shortness of breath, chest pain, or any other complaints.  Patient adds that she was recently prescribed Cipro and Flagyl and this is making her nauseous.  She asks if something can be done about this.     Past Medical History:  Diagnosis Date  . Arthritis   . Cervical spinal stenosis   . Complication of anesthesia    "makes me cry for days."  . Diverticulitis   . Family history of anesthesia complication    Sister has nausea and vomitting.  Marland Kitchen Hypercholesteremia   . Hypertension     Patient Active Problem List   Diagnosis Date Noted  . Vitamin D deficiency 01/02/2016  . Arthropathy 01/02/2016  . Tremor 01/02/2016  . Primary generalized (osteo)arthritis 01/02/2016  . Cigarette nicotine dependence, uncomplicated 85/09/7739  . Female climacteric state 01/02/2016  . Hyperglycemia 01/02/2016  . Essential (primary) hypertension 01/02/2016  . Elevated white blood cell count 01/02/2016  . 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 Cervicalgia 01/02/2016  . Radiculopathy of cervical region 01/02/2016  . Essential tremor 01/02/2016  . Anxiety disorder 01/02/2016  . Pure  hypercholesterolemia 01/02/2016  . Weakness 01/02/2016  . Fatigue 01/02/2016  . HLD (hyperlipidemia) 01/02/2016  . Body mass index (BMI) of 35.0-35.9 in adult 08/28/2014    Past Surgical History:  Procedure Laterality Date  . ABDOMINAL HYSTERECTOMY  1995  . ANTERIOR CERVICAL DECOMP/DISCECTOMY FUSION  12/30/11  . BACK SURGERY  1992   . COLECTOMY  2004   "lost 1/3 of my colon"  . THORACIC FUSION  1992  . TUMOR REMOVAL     small intestine     OB History   None      Home Medications    Prior to Admission medications   Medication Sig Start Date End Date Taking? Authorizing Provider  acetaminophen (TYLENOL) 500 MG tablet Take 500 mg by mouth every 6 (six) hours as needed for mild pain, moderate pain, fever or headache.    [provider]  ALPRAZolam Duanne Moron) 0.5 MG tablet Take 0.25 tablets by mouth every 6 (six) hours as needed for anxiety or sleep. anxiety 02/20/15   [provider]  calcium carbonate (OS-CAL - DOSED IN MG OF ELEMENTAL CALCIUM) 1250 (500 Ca) MG tablet Take 1 tablet by mouth 3 (three) times a week.    [provider]  cetirizine (ZYRTEC) 10 MG tablet Take 10 mg by mouth daily.    [provider]  Cholecalciferol (VITAMIN D) 50 MCG (2000 UT) tablet Take 2,000 Units by mouth 3 (three) times a week.     [provider]  ciprofloxacin (CIPRO) 500 MG tablet Take 1 tablet (500 mg total) by mouth 2 (two) times daily. 07/12/18   Lawyer, Harrell Gave, PA-C  ferrous sulfate 325 (65 FE) MG tablet Take 325 mg by mouth 3 (three) times a week.    [provider]  fexofenadine (ALLEGRA) 180 MG tablet Take 180 mg by mouth daily.    [provider]  glucosamine-chondroitin 500-400 MG tablet Take 1 tablet by mouth 3 (three) times a week.    [provider]  HYDROcodone-acetaminophen (NORCO) 5-325 MG tablet Take 1-2 tablets by mouth every 6 (six) hours as needed. Patient taking differently: Take 0.5 tablets by mouth  every 6 (six) hours as needed for moderate pain.  08/03/15   Veryl Speak, MD  hydrocortisone (ANUSOL-HC) 25 MG suppository Place 25 mg rectally daily as needed for hemorrhoids or anal itching.    [provider]  ketoconazole (NIZORAL) 2 % shampoo Apply 1 application topically 2 (two) times a week.    [provider]  losartan (COZAAR) 25 MG tablet Take 25 mg by mouth daily.  03/27/18   [provider]  metroNIDAZOLE (FLAGYL) 500 MG tablet Take 1 tablet (500 mg total) by mouth 2 (two) times daily. 07/12/18   Lawyer, Harrell Gave, PA-C  montelukast (SINGULAIR) 10 MG tablet Take 10 mg by mouth daily as needed (allergies or asthma).  05/02/18   [provider]  Multiple Vitamins-Minerals (MULTIVITAMIN WOMEN 50+) TABS Take 1 tablet by mouth 2 (two) times a week.    [provider]  mupirocin ointment (BACTROBAN) 2 % Place 1 application into the nose 2 (two) times daily. Patient taking differently: Place 1 application into the nose daily.  05/18/18   Valentina Shaggy, MD  ondansetron (ZOFRAN ODT) 4 MG disintegrating tablet Take 1 tablet (4 mg total) by mouth every 8 (eight) hours as needed for nausea or vomiting. 07/13/18   ,  C, PA-C  primidone (MYSOLINE) 50 MG tablet Take 1 tablet (50 mg total) by mouth at bedtime. Patient not taking: Reported on 05/04/2018 12/15/17   Tat, Eustace Quail, DO  traMADol (ULTRAM) 50 MG tablet Take 1 tablet (50 mg total) by mouth every 6 (six) hours as needed for severe pain. 07/12/18   Dalia Heading, PA-C    Family History Family History  Problem Relation Age of Onset  . Anesthesia problems Sister   . Allergic rhinitis Sister   . Food Allergy Sister        shellfish  . Angioedema Neg Hx   . Asthma Neg Hx   . Eczema Neg Hx   . Urticaria Neg Hx     Social History Social History   Tobacco Use  . Smoking status: Current Every Day Smoker    Packs/day: 1.00    Years: 40.00    Pack years: 40.00    Types:  Cigarettes  . Smokeless tobacco: Never Used  Substance Use Topics  . Alcohol use: No    Alcohol/week: 0.0 standard drinks  . Drug use: No     Allergies   Lyrica [pregabalin]; Simvastatin; Statins; and Meloxicam   Review of Systems Review of Systems  Constitutional: Negative for fever.  Respiratory: Negative for shortness of breath.   Cardiovascular: Negative for chest pain.  Skin:       Skin bump on back     Physical Exam Updated Vital Signs BP (!) 161/83 (BP Location: Left Arm)   Pulse 88   Temp 98.2 F (36.8 C) (  Oral)   Resp 19   SpO2 98%   Physical Exam  Constitutional: She appears well-developed and well-nourished. No distress.  HENT:  Head: Normocephalic and atraumatic.  Eyes: Conjunctivae are normal.  Neck: Neck supple.  Cardiovascular: Normal rate and regular rhythm.  Pulmonary/Chest: Effort normal and breath sounds normal. No respiratory distress.  Neurological: She is alert.  Skin: Skin is warm and dry. She is not diaphoretic. No pallor.  Patient has what appears to be a prominent skin pore on her upper back.  When the skin around this was deeply palpated, thick white substance is able to be expressed.  No noted swelling, erythema, or increased warmth.  Psychiatric: She has a normal mood and affect. Her behavior is normal.  Nursing note and vitals reviewed.          ED Treatments / Results  Labs (all labs ordered are listed, but only abnormal results are displayed) Labs Reviewed - No data to display  EKG None  Radiology   Procedures Procedures (including critical care time)  Medications Ordered in ED Medications - No data to display   Initial Impression / Assessment and Plan / ED Course  I have reviewed the triage vital signs and the nursing notes.  Pertinent labs & imaging results that were available during my care of the patient were reviewed by me and considered in my medical decision making (see chart for details).      Patient presents with concern for skin bump on her back.  This has the presentation suggestive of a sebaceous cyst.  She has other complaints that are being worked up by her PCP.  She has adequate dermatology follow-up artery scheduled.  She will be given Zofran for her nausea.  Findings and plan of care discussed with Nat Christen, MD.   Final Clinical Impressions(s) / ED Diagnoses   Final diagnoses:  Sebaceous cyst    ED Discharge Orders         Ordered    ondansetron (ZOFRAN ODT) 4 MG disintegrating tablet  Every 8 hours PRN     07/13/18 1200           Lorayne Bender, PA-C 07/13/18 1221    Nat Christen, MD 07/14/18 1118

## 2018-07-13 NOTE — ED Triage Notes (Signed)
Pt c/o "hole my back for over year, I think a parasite lives in there".  Reports was here yesterday for her abd issues and the doctor blew her off when she asked about her spot on her back. "If no one does anything about it today I am going to die on yall! I am serious!"

## 2018-07-13 NOTE — ED Notes (Signed)
Signature Pad not working in room.  Pt consents to D/C

## 2018-07-13 NOTE — Discharge Instructions (Addendum)
Follow-up with dermatology, as planned. Follow-up with your primary care provider for continued evaluation of your symptoms.  It may also be helpful for you to follow-up with psychiatry as we discussed.  This could be set up through your primary care provider and may help supplement the evaluation of your symptoms.  May take the Zofran, as needed, for nausea/vomiting.

## 2018-07-15 ENCOUNTER — Encounter (HOSPITAL_COMMUNITY): Payer: Self-pay | Admitting: Emergency Medicine

## 2018-07-15 ENCOUNTER — Emergency Department (HOSPITAL_COMMUNITY)
Admission: EM | Admit: 2018-07-15 | Discharge: 2018-07-15 | Disposition: A | Discharge status: Home / Self Care | Payer: Medicare Other | Attending: Emergency Medicine | Admitting: Emergency Medicine

## 2018-07-15 ENCOUNTER — Other Ambulatory Visit: Payer: Self-pay

## 2018-07-15 DIAGNOSIS — Z79899 Other long term (current) drug therapy: Secondary | ICD-10-CM | POA: Insufficient documentation

## 2018-07-15 DIAGNOSIS — I10 Essential (primary) hypertension: Secondary | ICD-10-CM | POA: Insufficient documentation

## 2018-07-15 DIAGNOSIS — R52 Pain, unspecified: Secondary | ICD-10-CM | POA: Diagnosis not present

## 2018-07-15 DIAGNOSIS — R202 Paresthesia of skin: Secondary | ICD-10-CM | POA: Insufficient documentation

## 2018-07-15 DIAGNOSIS — F17210 Nicotine dependence, cigarettes, uncomplicated: Secondary | ICD-10-CM | POA: Insufficient documentation

## 2018-07-15 NOTE — Discharge Instructions (Addendum)
You are seen in the ER for concerns for cyst and crawling sensation. We do not see assist for [place] to drain in your back. We are not sure why you are having feeling of crawling sensation, and it will be something that the primary care doctor will have to help you with.

## 2018-07-15 NOTE — ED Provider Notes (Signed)
[facility] DEPT Provider Note   CSN: [id number] Arrival date & time: 07/15/18  0428     History   Chief Complaint Chief Complaint  Patient presents with  . Cyst    HPI [name] is a 67 y.o. female.  HPI  67 year old female comes in with chief complaint of cyst to her back and crawling sensation. She reports that her symptoms have been present for at least a month.  She has crawling sensation all over her body including her face and scalp.  She also feels like she has a cyst in the back.  Patient has seen her PCP, GI and dermatologist for this -and she reports that no one has answers for her.  Patient also feels like her hair is falling.  She denies any vomiting, diarrhea, weight loss, night sweats, headaches.  Past Medical History:  Diagnosis Date  . Arthritis   . Cervical spinal stenosis   . Complication of anesthesia    "makes me cry for days."  . Diverticulitis   . Family history of anesthesia complication    Sister has nausea and vomitting.  Marland Kitchen Hypercholesteremia   . Hypertension     Patient Active Problem List   Diagnosis Date Noted  . Vitamin D deficiency 01/02/2016  . Arthropathy 01/02/2016  . Tremor 01/02/2016  . Primary generalized (osteo)arthritis 01/02/2016  . Cigarette nicotine dependence, uncomplicated 00/92/3300  . Female climacteric state 01/02/2016  . Hyperglycemia 01/02/2016  . Essential (primary) hypertension 01/02/2016  . Elevated white blood cell count 01/02/2016  . 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 Cervicalgia 01/02/2016  . Radiculopathy of cervical region 01/02/2016  . Essential tremor 01/02/2016  . Anxiety disorder 01/02/2016  . Pure hypercholesterolemia 01/02/2016  . Weakness 01/02/2016  . Fatigue 01/02/2016  . HLD (hyperlipidemia) 01/02/2016  . Body mass index (BMI) of 35.0-35.9 in adult 08/28/2014    Past Surgical History:  Procedure Laterality Date  .  ABDOMINAL HYSTERECTOMY  1995  . ANTERIOR CERVICAL DECOMP/DISCECTOMY FUSION  12/30/11  . BACK SURGERY  1992   . COLECTOMY  2004   "lost 1/3 of my colon"  . THORACIC FUSION  1992  . TUMOR REMOVAL     small intestine     OB History   None      Home Medications    Prior to Admission medications   Medication Sig Start Date End Date Taking? Authorizing Provider  acetaminophen (TYLENOL) 500 MG tablet Take 500 mg by mouth every 6 (six) hours as needed for mild pain, moderate pain, fever or headache.    [provider]  ALPRAZolam Duanne Moron) 0.5 MG tablet Take 0.25 tablets by mouth every 6 (six) hours as needed for anxiety or sleep. anxiety 02/20/15   [provider]  calcium carbonate (OS-CAL - DOSED IN MG OF ELEMENTAL CALCIUM) 1250 (500 Ca) MG tablet Take 1 tablet by mouth 3 (three) times a week.    [provider]  cetirizine (ZYRTEC) 10 MG tablet Take 10 mg by mouth daily.    [provider]  Cholecalciferol (VITAMIN D) 50 MCG (2000 UT) tablet Take 2,000 Units by mouth 3 (three) times a week.     [provider]  ciprofloxacin (CIPRO) 500 MG tablet Take 1 tablet (500 mg total) by mouth 2 (two) times daily. 07/12/18   Lawyer, Harrell Gave, PA-C  ferrous sulfate 325 (65 FE) MG tablet Take 325 mg by mouth 3 (three) times a week.  [provider]  fexofenadine (ALLEGRA) 180 MG tablet Take 180 mg by mouth daily.    [provider]  glucosamine-chondroitin 500-400 MG tablet Take 1 tablet by mouth 3 (three) times a week.    [provider]  HYDROcodone-acetaminophen (NORCO) 5-325 MG tablet Take 1-2 tablets by mouth every 6 (six) hours as needed. Patient taking differently: Take 0.5 tablets by mouth every 6 (six) hours as needed for moderate pain.  08/03/15   Veryl Speak, MD  hydrocortisone (ANUSOL-HC) 25 MG suppository Place 25 mg rectally daily as needed for hemorrhoids or anal itching.    [provider]  ketoconazole  (NIZORAL) 2 % shampoo Apply 1 application topically 2 (two) times a week.    [provider]  losartan (COZAAR) 25 MG tablet Take 25 mg by mouth daily.  03/27/18   [provider]  metroNIDAZOLE (FLAGYL) 500 MG tablet Take 1 tablet (500 mg total) by mouth 2 (two) times daily. 07/12/18   Lawyer, Harrell Gave, PA-C  montelukast (SINGULAIR) 10 MG tablet Take 10 mg by mouth daily as needed (allergies or asthma).  05/02/18   [provider]  Multiple Vitamins-Minerals (MULTIVITAMIN WOMEN 50+) TABS Take 1 tablet by mouth 2 (two) times a week.    [provider]  mupirocin ointment (BACTROBAN) 2 % Place 1 application into the nose 2 (two) times daily. Patient taking differently: Place 1 application into the nose daily.  05/18/18   Valentina Shaggy, MD  ondansetron (ZOFRAN ODT) 4 MG disintegrating tablet Take 1 tablet (4 mg total) by mouth every 8 (eight) hours as needed for nausea or vomiting. 07/13/18   Joy, Shawn C, PA-C  primidone (MYSOLINE) 50 MG tablet Take 1 tablet (50 mg total) by mouth at bedtime. Patient not taking: Reported on 05/04/2018 12/15/17   Tat, Eustace Quail, DO  traMADol (ULTRAM) 50 MG tablet Take 1 tablet (50 mg total) by mouth every 6 (six) hours as needed for severe pain. 07/12/18   Dalia Heading, PA-C    Family History Family History  Problem Relation Age of Onset  . Anesthesia problems Sister   . Allergic rhinitis Sister   . Food Allergy Sister        shellfish  . Angioedema Neg Hx   . Asthma Neg Hx   . Eczema Neg Hx   . Urticaria Neg Hx     Social History Social History   Tobacco Use  . Smoking status: Current Every Day Smoker    Packs/day: 1.00    Years: 40.00    Pack years: 40.00    Types: Cigarettes  . Smokeless tobacco: Never Used  Substance Use Topics  . Alcohol use: No    Alcohol/week: 0.0 standard drinks  . Drug use: No     Allergies   Lyrica [pregabalin]; Simvastatin; Statins; and Meloxicam   Review of  Systems Review of Systems  Constitutional: Positive for activity change.  Gastrointestinal: Negative for vomiting.  Skin: Negative for rash and wound.  Neurological: Negative for headaches.  All other systems reviewed and are negative.    Physical Exam Updated Vital Signs BP (!) 153/82 (BP Location: Left Arm)   Pulse 70   Temp 98.2 F (36.8 C) (Oral)   Resp (!) 24 Comment: pt actively crying to take out her cyst  SpO2 96%   Physical Exam  Constitutional: She is oriented to person, place, and time. She appears well-developed.  HENT:  Head: Normocephalic and atraumatic.  Eyes: EOM are normal.  Neck: Normal range of motion. Neck supple.  Cardiovascular: Normal rate.  Pulmonary/Chest: Effort normal.  Abdominal: Bowel sounds are normal.  Neurological: She is alert and oriented to person, place, and time.  Skin: Skin is warm and dry.  Psychiatric:  Emotionally labile  Nursing note and vitals reviewed.    ED Treatments / Results  Labs (all labs ordered are listed, but only abnormal results are displayed) Labs Reviewed - No data to display  EKG None  Radiology No results found.  Procedures Procedures (including critical care time)  Medications Ordered in ED Medications - No data to display   Initial Impression / Assessment and Plan / ED Course  I have reviewed the triage vital signs and the nursing notes.  Pertinent labs & imaging results that were available during my care of the patient were reviewed by me and considered in my medical decision making (see chart for details).     67 year old female without any underlying psychiatry history comes in with chief complaint of cyst and crawling sensation.  She thinks that there is something crawling in her back where she feels there is a cyst.  On my exam she has spasm, but there is no cyst in her back.  She proceeds to inform me that she is feeling crawling sensation all over her body.  She has no underlying  psychiatric history.  She denies any substance abuse.  She has seen her PCP and other specialist who have not uncovered why she is having the symptoms.  Patient denies any new medications.  I am unsure as to what the cause for the formication is.  He does not seem like an acute emergent process however and I have explained this to the patient.  She needs to see her PCP for further evaluation.  Final Clinical Impressions(s) / ED Diagnoses   Final diagnoses:  Formication    ED Discharge Orders    None       Varney Biles, MD 07/15/18 [phone]

## 2018-07-15 NOTE — ED Triage Notes (Addendum)
Pt presents via EMS for complaints of a cyst on her back that is infecting her whole body. Hx anxiety. Patient states she went to the doctor and yesterday and they removed "green stuff" from it. Band aid noted to back. Patient seen by PCP and here on 11/28 for same.

## 2018-07-15 NOTE — ED Notes (Addendum)
Called [employer] for patient to go home. Patient refusing to sign stating she is too shakey. Patient tearful and verbally aggressive at discharge.

## 2018-07-16 NOTE — ED Provider Notes (Signed)
[facility] DEPT Provider Note   CSN: [id number] Arrival date & time: 07/12/18  0530     History   Chief Complaint Chief Complaint  Patient presents with  . Abdominal Pain    HPI [name] is a 67 y.o. female.  HPI Patient presents to the emergency department with left lower quadrant abdominal pain that started 2 days ago.  The patient states she is also had some nausea associated with it.  The patient states she has had diverticulitis in the past and concerned that that is causing her issues.  The patient and tells me about having symptoms for the last year.  She states she has had weakness generalized, hair falling out, and just feeling unwell.  The patient states that nothing seems to make the condition better or worse.  She states that she seen her doctor multiple times for these complaints.  Patient is concerned that she may have a parasitic infection.  The patient denies chest pain, shortness of breath, headache,blurred vision, neck pain, fever, cough, weakness, numbness, dizziness, anorexia, edema,  vomiting, diarrhea, rash, back pain, dysuria, hematemesis, bloody stool, near syncope, or syncope. Past Medical History:  Diagnosis Date  . Arthritis   . Cervical spinal stenosis   . Complication of anesthesia    "makes me cry for days."  . Diverticulitis   . Family history of anesthesia complication    Sister has nausea and vomitting.  Marland Kitchen Hypercholesteremia   . Hypertension     Patient Active Problem List   Diagnosis Date Noted  . Vitamin D deficiency 01/02/2016  . Arthropathy 01/02/2016  . Tremor 01/02/2016  . Primary generalized (osteo)arthritis 01/02/2016  . Cigarette nicotine dependence, uncomplicated 12/45/8099  . Female climacteric state 01/02/2016  . Hyperglycemia 01/02/2016  . Essential (primary) hypertension 01/02/2016  . Elevated white blood cell count 01/02/2016  . 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 Cervicalgia 01/02/2016  . Radiculopathy of cervical region 01/02/2016  . Essential tremor 01/02/2016  . Anxiety disorder 01/02/2016  . Pure hypercholesterolemia 01/02/2016  . Weakness 01/02/2016  . Fatigue 01/02/2016  . HLD (hyperlipidemia) 01/02/2016  . Body mass index (BMI) of 35.0-35.9 in adult 08/28/2014    Past Surgical History:  Procedure Laterality Date  . ABDOMINAL HYSTERECTOMY  1995  . ANTERIOR CERVICAL DECOMP/DISCECTOMY FUSION  12/30/11  . BACK SURGERY  1992   . COLECTOMY  2004   "lost 1/3 of my colon"  . THORACIC FUSION  1992  . TUMOR REMOVAL     small intestine     OB History   None      Home Medications    Prior to Admission medications   Medication Sig Start Date End Date Taking? Authorizing Provider  acetaminophen (TYLENOL) 500 MG tablet Take 500 mg by mouth every 6 (six) hours as needed for mild pain, moderate pain, fever or headache.   Yes [provider]  ALPRAZolam Duanne Moron) 0.5 MG tablet Take 0.25 tablets by mouth every 6 (six) hours as needed for anxiety or sleep. anxiety 02/20/15  Yes [provider]  calcium carbonate (OS-CAL - DOSED IN MG OF ELEMENTAL CALCIUM) 1250 (500 Ca) MG tablet Take 1 tablet by mouth 3 (three) times a week.   Yes [provider]  cetirizine (ZYRTEC) 10 MG tablet Take 10 mg by mouth daily.   Yes [provider]  Cholecalciferol (VITAMIN D) 50 MCG (2000 UT) tablet Take 2,000 Units by mouth 3 (three) times a  week.    Yes [provider]  ferrous sulfate 325 (65 FE) MG tablet Take 325 mg by mouth 3 (three) times a week.   Yes [provider]  fexofenadine (ALLEGRA) 180 MG tablet Take 180 mg by mouth daily.   Yes [provider]  glucosamine-chondroitin 500-400 MG tablet Take 1 tablet by mouth 3 (three) times a week.   Yes [provider]  HYDROcodone-acetaminophen (NORCO) 5-325 MG tablet Take 1-2 tablets by mouth every 6 (six) hours as needed. Patient  taking differently: Take 0.5 tablets by mouth every 6 (six) hours as needed for moderate pain.  08/03/15  Yes Delo, Nathaneil Canary, MD  hydrocortisone (ANUSOL-HC) 25 MG suppository Place 25 mg rectally daily as needed for hemorrhoids or anal itching.   Yes [provider]  ketoconazole (NIZORAL) 2 % shampoo Apply 1 application topically 2 (two) times a week.   Yes [provider]  losartan (COZAAR) 25 MG tablet Take 25 mg by mouth daily.  03/27/18  Yes [provider]  montelukast (SINGULAIR) 10 MG tablet Take 10 mg by mouth daily as needed (allergies or asthma).  05/02/18  Yes [provider]  Multiple Vitamins-Minerals (MULTIVITAMIN WOMEN 50+) TABS Take 1 tablet by mouth 2 (two) times a week.   Yes [provider]  mupirocin ointment (BACTROBAN) 2 % Place 1 application into the nose 2 (two) times daily. Patient taking differently: Place 1 application into the nose daily.  05/18/18  Yes Valentina Shaggy, MD  ciprofloxacin (CIPRO) 500 MG tablet Take 1 tablet (500 mg total) by mouth 2 (two) times daily. 07/12/18   Purvis Sidle, Harrell Gave, PA-C  metroNIDAZOLE (FLAGYL) 500 MG tablet Take 1 tablet (500 mg total) by mouth 2 (two) times daily. 07/12/18   Therron Sells, Harrell Gave, PA-C  ondansetron (ZOFRAN ODT) 4 MG disintegrating tablet Take 1 tablet (4 mg total) by mouth every 8 (eight) hours as needed for nausea or vomiting. 07/13/18   Joy, Shawn C, PA-C  primidone (MYSOLINE) 50 MG tablet Take 1 tablet (50 mg total) by mouth at bedtime. Patient not taking: Reported on 05/04/2018 12/15/17   Tat, Eustace Quail, DO  traMADol (ULTRAM) 50 MG tablet Take 1 tablet (50 mg total) by mouth every 6 (six) hours as needed for severe pain. 07/12/18   Dalia Heading, PA-C    Family History Family History  Problem Relation Age of Onset  . Anesthesia problems Sister   . Allergic rhinitis Sister   . Food Allergy Sister        shellfish  . Angioedema Neg Hx   . Asthma Neg Hx   . Eczema  Neg Hx   . Urticaria Neg Hx     Social History Social History   Tobacco Use  . Smoking status: Current Every Day Smoker    Packs/day: 1.00    Years: 40.00    Pack years: 40.00    Types: Cigarettes  . Smokeless tobacco: Never Used  Substance Use Topics  . Alcohol use: No    Alcohol/week: 0.0 standard drinks  . Drug use: No     Allergies   Lyrica [pregabalin]; Simvastatin; Statins; and Meloxicam   Review of Systems Review of Systems All other systems negative except as documented in the HPI. All pertinent positives and negatives as reviewed in the HPI.  Physical Exam Updated Vital Signs BP (!) 144/112 (BP Location: Right Arm) Comment: Patient refuses to retake blood pressure, stating "Yall have got to just let me go!"  Pulse 73  Temp 98 F (36.7 C) (Oral)   Resp 16   Ht 5\' 5"  (1.651 m)   Wt 90.7 kg   SpO2 96%   BMI 33.28 kg/m   Physical Exam  Constitutional: She is oriented to person, place, and time. She appears well-developed and well-nourished. No distress.  HENT:  Head: Normocephalic and atraumatic.  Mouth/Throat: Oropharynx is clear and moist.  Eyes: Pupils are equal, round, and reactive to light.  Neck: Normal range of motion. Neck supple.  Cardiovascular: Normal rate, regular rhythm and normal heart sounds. Exam reveals no gallop and no friction rub.  No murmur heard. Pulmonary/Chest: Effort normal and breath sounds normal. No respiratory distress. She has no wheezes.  Abdominal: Soft. Bowel sounds are normal. She exhibits no distension. There is tenderness in the left lower quadrant. There is no rigidity, no guarding and no tenderness at McBurney's point.  Neurological: She is alert and oriented to person, place, and time. She exhibits normal muscle tone. Coordination normal.  Skin: Skin is warm and dry. Capillary refill takes less than 2 seconds. No rash noted. No erythema.  Psychiatric: She has a normal mood and affect. Her behavior is normal.  Nursing  note and vitals reviewed.    ED Treatments / Results  Labs (all labs ordered are listed, but only abnormal results are displayed) Labs Reviewed  URINE CULTURE - Abnormal; Notable for the following components:      Result Value   Culture MULTIPLE SPECIES PRESENT, SUGGEST RECOLLECTION (*)    All other components within normal limits  COMPREHENSIVE METABOLIC PANEL - Abnormal; Notable for the following components:   Total Protein 8.5 (*)    All other components within normal limits  CBC WITH DIFFERENTIAL/PLATELET - Abnormal; Notable for the following components:   WBC 11.2 (*)    All other components within normal limits  URINALYSIS, ROUTINE W REFLEX MICROSCOPIC - Abnormal; Notable for the following components:   Specific Gravity, Urine 1.032 (*)    All other components within normal limits  LIPASE, BLOOD    EKG None  Radiology No results found.  Procedures Procedures (including critical care time)  Medications Ordered in ED Medications  sodium chloride 0.9 % bolus 1,000 mL (0 mLs Intravenous Stopped 07/12/18 0943)  morphine 4 MG/ML injection 4 mg (4 mg Intravenous Given 07/12/18 0842)  iopamidol (ISOVUE-300) 61 % injection 100 mL (100 mLs Intravenous Contrast Given 07/12/18 1014)     Initial Impression / Assessment and Plan / ED Course  I have reviewed the triage vital signs and the nursing notes.  Pertinent labs & imaging results that were available during my care of the patient were reviewed by me and considered in my medical decision making (see chart for details).     She has explained the findings and there is no normal findings at this time.  I did advise that she need to follow-up with her primary doctor.  Told to return here as needed.  Patient is stable in the ER.  I did advise her that these chronic issues need to be explored further by a specialist most likely.  She states she is going to see someone about the symptoms in the next week.  I do not have a good  explanation for the patient's left lower quadrant abdominal pain at this time.  I did advise her that this could be an evolving process and that she will need to return here for any worsening her condition.  Final Clinical Impressions(s) / ED  Diagnoses   Final diagnoses:  Left lower quadrant abdominal pain    ED Discharge Orders         Ordered    ciprofloxacin (CIPRO) 500 MG tablet  2 times daily     07/12/18 1319    metroNIDAZOLE (FLAGYL) 500 MG tablet  2 times daily     07/12/18 1319    traMADol (ULTRAM) 50 MG tablet  Every 6 hours PRN     07/12/18 1319           Raenette Sakata, [place], PA-C 07/16/18 1612    Milton Ferguson, MD 07/19/18 [phone]

## 2018-07-17 DIAGNOSIS — L723 Sebaceous cyst: Secondary | ICD-10-CM | POA: Diagnosis not present

## 2018-07-17 DIAGNOSIS — R5382 Chronic fatigue, unspecified: Secondary | ICD-10-CM | POA: Diagnosis not present

## 2018-07-18 DIAGNOSIS — L821 Other seborrheic keratosis: Secondary | ICD-10-CM | POA: Diagnosis not present

## 2018-07-18 DIAGNOSIS — L720 Epidermal cyst: Secondary | ICD-10-CM | POA: Diagnosis not present

## 2018-07-19 ENCOUNTER — Ambulatory Visit: Payer: Medicare Other | Admitting: Allergy

## 2018-07-25 ENCOUNTER — Ambulatory Visit: Payer: Medicare Other | Admitting: Allergy & Immunology

## 2018-07-25 ENCOUNTER — Encounter: Payer: Self-pay | Admitting: Allergy & Immunology

## 2018-07-25 VITALS — BP 118/70 | HR 68 | Temp 98.1°F | Resp 18 | Ht 65.0 in | Wt 200.0 lb

## 2018-07-25 DIAGNOSIS — R5383 Other fatigue: Secondary | ICD-10-CM | POA: Diagnosis not present

## 2018-07-25 DIAGNOSIS — J3089 Other allergic rhinitis: Secondary | ICD-10-CM | POA: Diagnosis not present

## 2018-07-25 MED ORDER — METHYLPREDNISOLONE ACETATE 40 MG/ML IJ SUSP
40.0000 mg | Freq: Once | INTRAMUSCULAR | Status: AC
  Administered 2018-07-25: 40 mg via INTRAMUSCULAR

## 2018-07-25 NOTE — Progress Notes (Signed)
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  She seems to be under the impression that she needs an antifungal.  However, she is nontoxic-appearing and has no evidence of a fungal infection.  She has clearly developed a perseveration on the mold exposure and while she is certainly displaying an intense clinical picture, I do not think this is due to her mold exposure.  We did discuss the use of antihistamines, and she is already using 1-2 twice a day.  She is not interested in increasing the doses of her antihistamines.  She could certainly start allergen immunotherapy, but this takes quite a lot of time to work and I do not think it would be all that efficacious since dust mites are her most prominent trigger.  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  She could also likely benefit from some psychiatric care, and I will defer to her primary care provider to discuss this further.  We did give 1 dose of Depo-Medrol today to help with any rhinitis symptoms she might be experiencing and to modulate her immune system.      Plan/Recommendations:   1. Fatigue with mold exposure - We will give you a DepoMedrol shot today. - You only had a low level of allergy to dust mites and one mold which is common to to all people (Candida albicans).  - I would recommend going to an integrative health provider such as [facility] ([email or web address]://[email or web address]).  2. Return if symptoms worsen or fail to improve.   Subjective:   [name] is a 67 y.o. female presenting  today for follow up of  Chief Complaint  Patient presents with  . mold exposure  . Nausea  . Chills  . Shortness of Breath    [name] has a history of the following: Patient Active Problem List   Diagnosis Date Noted  . Vitamin D deficiency 01/02/2016  . Arthropathy 01/02/2016  . Tremor 01/02/2016  . Primary generalized (osteo)arthritis 01/02/2016  . Cigarette nicotine dependence, uncomplicated 22/29/7989  . Female climacteric state 01/02/2016  . Hyperglycemia 01/02/2016  . Essential (primary) hypertension 01/02/2016  . Elevated white blood cell count 01/02/2016  . 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 Cervicalgia 01/02/2016  . Radiculopathy of cervical region 01/02/2016  . Essential tremor 01/02/2016  . Anxiety disorder 01/02/2016  . Pure hypercholesterolemia 01/02/2016  . Weakness 01/02/2016  . Fatigue 01/02/2016  . HLD (hyperlipidemia) 01/02/2016  . Body mass index (BMI) of 35.0-35.9 in adult 08/28/2014    History obtained from: chart review and patient and her friend.  [facility] Provider is Wyline Beady, MD.     [name] is a 67 y.o. female presenting for a sick visit. She was   Since the last visit, she has not done well. She had "continuously been exposed to mold". She has been exposed to mold. She is "sooooooooooooooo sick" and feels that she has "been poisoned". She is "losing weight". She did talk to her PCP who recommended  that she talk to me instead. She has not had a fever at all. She does report some coughing and SOB, although she does not cough at all during her visit today.  She did recently get a cyst on her back that became abscessed. She found some mold in her house even after the remediation. She began with mold under her house and they did remediation. She had additional mold remediated in her home.  But then she felt more mold in her bathroom.  When she found mold in her bathroom,  she moved in with 1 of her other friends.  She lived there for a few days before she noticed mold in her friend's bathroom.  She is now living with another friend, who accompanies her today.     She did tell me that Zyrtec was helping with her symptoms at the last visit, but this is completely different. She has been using the Zyrtec and Allegra.  She is not using a nasal spray.  She continues to assert that she needs an antifungal medication.  She has had no fevers.  She has not had a chest x-ray.  Otherwise, there have been no changes to her past medical history, surgical history, family history, or social history.    Review of Systems: a 14-point review of systems is pertinent for what is mentioned in HPI.  Otherwise, all other systems were negative.  Constitutional: negative other than that listed in the HPI Eyes: negative other than that listed in the HPI Ears, nose, mouth, throat, and face: negative other than that listed in the HPI Respiratory: negative other than that listed in the HPI Cardiovascular: negative other than that listed in the HPI Gastrointestinal: negative other than that listed in the HPI Genitourinary: negative other than that listed in the HPI Integument: negative other than that listed in the HPI Hematologic: negative other than that listed in the HPI Musculoskeletal: negative other than that listed in the HPI Neurological: negative other than that listed in the HPI Allergy/Immunologic: negative other than that listed in the HPI    Objective:   Blood pressure 118/70, pulse 68, temperature 98.1 F (36.7 C), temperature source Oral, resp. rate 18, height 5\' 5"  (1.651 m), weight 200 lb (90.7 kg), SpO2 97 %. Body mass index is 33.28 kg/m.   Physical Exam:  General: Alert, interactive, in mild-moderate acute distress. Flight of ideas present.  Eyes: No conjunctival injection bilaterally, no discharge on the right, no discharge on the left, no Horner-Trantas  dots present and allergic shiners present bilaterally. PERRL bilaterally. EOMI without pain. No photophobia.  Ears: Right TM pearly gray with normal light reflex, Left TM pearly gray with normal light reflex, Right TM intact without perforation and Left TM intact without perforation.  Nose/Throat: External nose within normal limits and septum midline. Turbinates minimally edematous without discharge. Posterior oropharynx mildly erythematous without cobblestoning in the posterior oropharynx. Tonsils 2+ without exudates.  Tongue without thrush. Lungs: Clear to auscultation without wheezing, rhonchi or rales. No increased work of breathing. CV: Normal S1/S2. No murmurs. Capillary refill <2 seconds.  Skin: Warm and dry, without lesions or rashes. Neuro:   Grossly intact. No focal deficits appreciated. Responsive to questions.  Diagnostic studies: none (DepoMedrol injection given in clinic today)      Salvatore Marvel, MD  [facility]

## 2018-07-25 NOTE — Patient Instructions (Addendum)
1. Fatigue with mold exposure - We will give you a DepoMedrol shot today. - You only had a low level of allergy to dust mites and one mold which is common to to all people (Candida albicans).  - I would recommend going to an integrative health provider such as [facility] ([email or web address]).  2. Return if symptoms worsen or fail to improve.   Please inform us of any Emergency Department visits, hospitalizations, or changes in symptoms. Call us before going to the ED for breathing or allergy symptoms since we might be able to fit you in for a sick visit. Feel free to contact us anytime with any questions, problems, or concerns.  It was a pleasure to see you again today!  Websites that have reliable patient information: 1. American Academy of Asthma, Allergy, and Immunology: www.aaaai.org 2. Food Allergy Research and Education (FARE): foodallergy.org 3. Mothers of Asthmatics: http://www.asthmacommunitynetwork.org 4. American College of Allergy, Asthma, and Immunology: MonthlyElectricBill.co.uk   Make sure you are registered to vote! If you have moved or changed any of your contact information, you will need to get this updated before voting!

## 2018-07-26 ENCOUNTER — Encounter: Payer: Self-pay | Admitting: Allergy & Immunology

## 2018-07-27 ENCOUNTER — Encounter: Payer: Self-pay | Admitting: Allergy & Immunology

## 2018-07-27 DIAGNOSIS — R634 Abnormal weight loss: Secondary | ICD-10-CM | POA: Diagnosis not present

## 2018-07-27 DIAGNOSIS — K625 Hemorrhage of anus and rectum: Secondary | ICD-10-CM | POA: Diagnosis not present

## 2018-07-27 DIAGNOSIS — K5732 Diverticulitis of large intestine without perforation or abscess without bleeding: Secondary | ICD-10-CM | POA: Diagnosis not present

## 2018-07-27 DIAGNOSIS — R1032 Left lower quadrant pain: Secondary | ICD-10-CM | POA: Diagnosis not present

## 2018-08-02 ENCOUNTER — Emergency Department (HOSPITAL_COMMUNITY): Payer: Medicare Other

## 2018-08-02 ENCOUNTER — Other Ambulatory Visit: Payer: Self-pay

## 2018-08-02 ENCOUNTER — Emergency Department (HOSPITAL_COMMUNITY)
Admission: EM | Admit: 2018-08-02 | Discharge: 2018-08-02 | Disposition: A | Discharge status: Home / Self Care | Payer: Medicare Other | Attending: Emergency Medicine | Admitting: Emergency Medicine

## 2018-08-02 ENCOUNTER — Encounter (HOSPITAL_COMMUNITY): Payer: Self-pay

## 2018-08-02 DIAGNOSIS — R0602 Shortness of breath: Secondary | ICD-10-CM | POA: Diagnosis not present

## 2018-08-02 DIAGNOSIS — F17210 Nicotine dependence, cigarettes, uncomplicated: Secondary | ICD-10-CM | POA: Insufficient documentation

## 2018-08-02 DIAGNOSIS — Z79899 Other long term (current) drug therapy: Secondary | ICD-10-CM | POA: Diagnosis not present

## 2018-08-02 DIAGNOSIS — Z77120 Contact with and (suspected) exposure to mold (toxic): Secondary | ICD-10-CM | POA: Diagnosis not present

## 2018-08-02 DIAGNOSIS — J189 Pneumonia, unspecified organism: Secondary | ICD-10-CM | POA: Diagnosis not present

## 2018-08-02 DIAGNOSIS — R05 Cough: Secondary | ICD-10-CM | POA: Diagnosis present

## 2018-08-02 DIAGNOSIS — I10 Essential (primary) hypertension: Secondary | ICD-10-CM | POA: Insufficient documentation

## 2018-08-02 MED ORDER — PREDNISONE 20 MG PO TABS
40.0000 mg | ORAL_TABLET | Freq: Once | ORAL | Status: AC
  Administered 2018-08-02: 40 mg via ORAL
  Filled 2018-08-02: qty 2

## 2018-08-02 MED ORDER — LORAZEPAM 1 MG PO TABS
1.0000 mg | ORAL_TABLET | Freq: Once | ORAL | Status: AC
  Administered 2018-08-02: 1 mg via ORAL
  Filled 2018-08-02: qty 1

## 2018-08-02 MED ORDER — PREDNISONE 10 MG PO TABS: ORAL_TABLET | ORAL | 0 refills | Status: AC

## 2018-08-02 MED ORDER — ALBUTEROL SULFATE HFA 108 (90 BASE) MCG/ACT IN AERS
2.0000 | INHALATION_SPRAY | Freq: Once | RESPIRATORY_TRACT | Status: AC
  Administered 2018-08-02: 2 via RESPIRATORY_TRACT
  Filled 2018-08-02: qty 6.7

## 2018-08-02 NOTE — ED Provider Notes (Signed)
[facility] DEPT Provider Note   CSN: [id number] Arrival date & time: 08/02/18  0806     History   Chief Complaint Chief Complaint  Patient presents with  . Mold Exposure    HPI [name] is a 67 y.o. female.  HPI 67 year old female here with subjective cough and shortness of breath.  The patient has had an extensively documented history of similar complaints.  She states that since finding out that her home had Aspergillus and Candida in the air system starting last year, she has felt generally fatigued, weak, and had poor appetite.  She is had a persistent, mild, cough and wheezing.  She feels like it is worse whenever she goes in the home.  She has been seen by her PCP for this with recent negative lab work.  She was also recently seen by an allergy specialist who recommended antihistamines and seeing an naturopathic MD.  Patient states that she felt slightly better initially after seeing the physician, but symptoms have returned and worsen.  Denies known history of asthma.  She endorses general fatigue and weakness and perseverates on the fact that she just "feels sick."  She is currently undergoing remediation in her home for the mold.  No fevers or chills.  No sputum production.  Past Medical History:  Diagnosis Date  . Arthritis   . Cervical spinal stenosis   . Complication of anesthesia    "makes me cry for days."  . Diverticulitis   . Family history of anesthesia complication    Sister has nausea and vomitting.  Marland Kitchen Hypercholesteremia   . Hypertension     Patient Active Problem List   Diagnosis Date Noted  . Vitamin D deficiency 01/02/2016  . Arthropathy 01/02/2016  . Tremor 01/02/2016  . Primary generalized (osteo)arthritis 01/02/2016  . Cigarette nicotine dependence, uncomplicated 36/64/4034  . Female climacteric state 01/02/2016  . Hyperglycemia 01/02/2016  . Essential (primary) hypertension 01/02/2016  . Elevated white blood  cell count 01/02/2016  . 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 Cervicalgia 01/02/2016  . Radiculopathy of cervical region 01/02/2016  . Essential tremor 01/02/2016  . Anxiety disorder 01/02/2016  . Pure hypercholesterolemia 01/02/2016  . Weakness 01/02/2016  . Fatigue 01/02/2016  . HLD (hyperlipidemia) 01/02/2016  . Body mass index (BMI) of 35.0-35.9 in adult 08/28/2014    Past Surgical History:  Procedure Laterality Date  . ABDOMINAL HYSTERECTOMY  1995  . ANTERIOR CERVICAL DECOMP/DISCECTOMY FUSION  12/30/11  . BACK SURGERY  1992   . COLECTOMY  2004   "lost 1/3 of my colon"  . THORACIC FUSION  1992  . TUMOR REMOVAL     small intestine     OB History   No obstetric history on file.      Home Medications    Prior to Admission medications   Medication Sig Start Date End Date Taking? Authorizing Provider  acetaminophen (TYLENOL) 500 MG tablet Take 500 mg by mouth every 6 (six) hours as needed for mild pain, moderate pain, fever or headache.   Yes [provider]  ALPRAZolam Duanne Moron) 0.5 MG tablet Take 0.25 tablets by mouth every 6 (six) hours as needed for anxiety or sleep. anxiety 02/20/15  Yes [provider]  calcium carbonate (OS-CAL - DOSED IN MG OF ELEMENTAL CALCIUM) 1250 (500 Ca) MG tablet Take 1 tablet by mouth 3 (three) times a week.   Yes [provider]  cetirizine (ZYRTEC) 10 MG tablet Take 10  mg by mouth daily.   Yes [provider]  Cholecalciferol (VITAMIN D) 50 MCG (2000 UT) tablet Take 2,000 Units by mouth 3 (three) times a week.    Yes [provider]  ciprofloxacin (CIPRO) 500 MG tablet Take 1 tablet (500 mg total) by mouth 2 (two) times daily. 07/12/18  Yes Lawyer, Harrell Gave, PA-C  ferrous sulfate 325 (65 FE) MG tablet Take 325 mg by mouth 3 (three) times a week.   Yes [provider]  fexofenadine (ALLEGRA) 180 MG tablet Take 180 mg by mouth daily.   Yes [provider]  glucosamine-chondroitin 500-400 MG tablet Take 1 tablet by mouth 3 (three) times a week.   Yes [provider]  HYDROcodone-acetaminophen (NORCO) 5-325 MG tablet Take 1-2 tablets by mouth every 6 (six) hours as needed. Patient taking differently: Take 0.5 tablets by mouth every 6 (six) hours as needed for moderate pain.  08/03/15  Yes Delo, Nathaneil Canary, MD  hydrocortisone (ANUSOL-HC) 25 MG suppository Place 25 mg rectally daily as needed for hemorrhoids or anal itching.   Yes [provider]  ketoconazole (NIZORAL) 2 % shampoo Apply 1 application topically 2 (two) times a week.   Yes [provider]  losartan (COZAAR) 25 MG tablet Take 25 mg by mouth daily.  03/27/18  Yes [provider]  metroNIDAZOLE (FLAGYL) 500 MG tablet Take 1 tablet (500 mg total) by mouth 2 (two) times daily. 07/12/18  Yes Lawyer, Harrell Gave, PA-C  mupirocin ointment (BACTROBAN) 2 % Place 1 application into the nose 2 (two) times daily. Patient taking differently: Place 1 application into the nose daily.  05/18/18  Yes Valentina Shaggy, MD  ondansetron (ZOFRAN ODT) 4 MG disintegrating tablet Take 1 tablet (4 mg total) by mouth every 8 (eight) hours as needed for nausea or vomiting. Patient not taking: Reported on 07/25/2018 07/13/18   Joy, Helane Gunther, PA-C  predniSONE (DELTASONE) 10 MG tablet Take 4 tablets (40 mg total) by mouth daily for 3 days, THEN 2 tablets (20 mg total) daily for 3 days, THEN 1 tablet (10 mg total) daily for 3 days. 08/02/18 08/11/18  Duffy Bruce, MD  primidone (MYSOLINE) 50 MG tablet Take 1 tablet (50 mg total) by mouth at bedtime. Patient not taking: Reported on 05/04/2018 12/15/17   Tat, Eustace Quail, DO  traMADol (ULTRAM) 50 MG tablet Take 1 tablet (50 mg total) by mouth every 6 (six) hours as needed for severe pain. Patient not taking: Reported on 08/02/2018 07/12/18   Dalia Heading, PA-C    Family History Family History  Problem Relation Age  of Onset  . Anesthesia problems Sister   . Allergic rhinitis Sister   . Food Allergy Sister        shellfish  . Angioedema Neg Hx   . Asthma Neg Hx   . Eczema Neg Hx   . Urticaria Neg Hx     Social History Social History   Tobacco Use  . Smoking status: Current Every Day Smoker    Packs/day: 1.00    Years: 40.00    Pack years: 40.00    Types: Cigarettes  . Smokeless tobacco: Never Used  . Tobacco comment: pt states she has not smoked since 07/26/18  Substance Use Topics  . Alcohol use: No    Alcohol/week: 0.0 standard drinks  . Drug use: No     Allergies   Lyrica [pregabalin]; Simvastatin; Statins; and Meloxicam   Review of Systems Review of Systems  Constitutional: Positive  for fatigue. Negative for chills and fever.  HENT: Negative for congestion and rhinorrhea.   Eyes: Negative for visual disturbance.  Respiratory: Positive for cough and shortness of breath. Negative for wheezing.   Cardiovascular: Negative for chest pain and leg swelling.  Gastrointestinal: Negative for abdominal pain, diarrhea, nausea and vomiting.  Genitourinary: Negative for dysuria and flank pain.  Musculoskeletal: Negative for neck pain and neck stiffness.  Skin: Negative for rash and wound.  Allergic/Immunologic: Negative for immunocompromised state.  Neurological: Positive for weakness. Negative for syncope and headaches.  All other systems reviewed and are negative.    Physical Exam Updated Vital Signs BP (!) 155/94   Pulse 74   Temp 98.5 F (36.9 C) (Oral)   Resp 18   Ht 5\' 5"  (1.651 m)   Wt 90.7 kg   SpO2 98%   BMI 33.27 kg/m   Physical Exam Vitals signs and nursing note reviewed.  Constitutional:      General: She is not in acute distress.    Appearance: She is well-developed.  HENT:     Head: Normocephalic and atraumatic.  Eyes:     Conjunctiva/sclera: Conjunctivae normal.  Neck:     Musculoskeletal: Neck supple.  Cardiovascular:     Rate and Rhythm: Normal  rate and regular rhythm.     Heart sounds: Normal heart sounds. No murmur. No friction rub.  Pulmonary:     Effort: Pulmonary effort is normal. No respiratory distress.     Breath sounds: Wheezing (scant, occasional) present. No rales.  Abdominal:     General: There is no distension.  Skin:    General: Skin is warm.     Capillary Refill: Capillary refill takes less than 2 seconds.  Neurological:     Mental Status: She is alert and oriented to person, place, and time.     Motor: No abnormal muscle tone.  Psychiatric:        Mood and Affect: Mood is anxious.      ED Treatments / Results  Labs (all labs ordered are listed, but only abnormal results are displayed) Labs Reviewed - No data to display  EKG None  Radiology Dg Chest 2 View  Result Date: 08/02/2018 CLINICAL DATA:  Shortness of breath EXAM: CHEST - 2 VIEW COMPARISON:  5/14/8 FINDINGS: The heart size and mediastinal contours are within normal limits. Both lungs are clear. The visualized skeletal structures show degenerative change of the thoracic spine. IMPRESSION: No active cardiopulmonary disease. Electronically Signed   By: Inez Catalina M.D.   On: 08/02/2018 09:39    Procedures Procedures (including critical care time)  Medications Ordered in ED Medications  LORazepam (ATIVAN) tablet 1 mg (1 mg Oral Given 08/02/18 0906)  predniSONE (DELTASONE) tablet 40 mg (40 mg Oral Given 08/02/18 1056)  albuterol (PROVENTIL HFA;VENTOLIN HFA) 108 (90 Base) MCG/ACT inhaler 2 puff (2 puffs Inhalation Given 08/02/18 1058)     Initial Impression / Assessment and Plan / ED Course  I have reviewed the triage vital signs and the nursing notes.  Pertinent labs & imaging results that were available during my care of the patient were reviewed by me and considered in my medical decision making (see chart for details).  Clinical Course as of Aug 02 1101  Wed Aug 02, 2018  0848 67 yo F with h/o anxiety here with cough, subjective SOB  2/2 mold exposure. Pt has been seen multiple times for this, with allergy referral as recently as 12/10. Per interview and records,  there seems to be a strong anxiety component, but also a possible allergic component with IgE to Candida. Pt is not immune suppressed and has no signs of invasive apergillosis. Her VS are stable. Recent labs at PCP unremarkable. Due to her allergy component, I think it's reasonable to give a dose of decadron and trial an inhaler, with Atarax for itching and anxiety.   [CI]  1058 CXR negative. I had a long discussion with pt, sister about mold exposure, allergic pneumonitis. She is satting well and o/w well appearing, with multiple specialist follow-ups. Given her improvement w/ dose of depomedrol at allergist, will trial a slower taper and d/c with outpt follow-up.   [CI]    Clinical Course User Index [CI] Duffy Bruce, MD     Final Clinical Impressions(s) / ED Diagnoses   Final diagnoses:  Mold exposure  Pneumonitis    ED Discharge Orders         Ordered    predniSONE (DELTASONE) 10 MG tablet     08/02/18 1101           Duffy Bruce, MD 08/02/18 1102

## 2018-08-02 NOTE — ED Triage Notes (Addendum)
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 Pt brings in a letter from 2018, but states that since then, she thought it was remediated, but was told the mold came back again. Pt states that she can't breathe- VSS. Pt appears anxious, stating "they got mad at me last time".

## 2018-08-02 NOTE — ED Notes (Signed)
Patient transported to X-ray 

## 2018-08-02 NOTE — ED Notes (Signed)
Bed: WA08 Expected date:  Expected time:  Means of arrival:  Comments: 

## 2018-08-05 ENCOUNTER — Other Ambulatory Visit: Payer: Self-pay

## 2018-08-05 ENCOUNTER — Encounter (HOSPITAL_COMMUNITY): Payer: Self-pay

## 2018-08-05 ENCOUNTER — Emergency Department (HOSPITAL_COMMUNITY)
Admission: EM | Admit: 2018-08-05 | Discharge: 2018-08-05 | Disposition: A | Discharge status: Home / Self Care | Payer: Medicare Other | Attending: Emergency Medicine | Admitting: Emergency Medicine

## 2018-08-05 DIAGNOSIS — I10 Essential (primary) hypertension: Secondary | ICD-10-CM | POA: Diagnosis not present

## 2018-08-05 DIAGNOSIS — F17210 Nicotine dependence, cigarettes, uncomplicated: Secondary | ICD-10-CM | POA: Diagnosis not present

## 2018-08-05 DIAGNOSIS — R531 Weakness: Secondary | ICD-10-CM | POA: Diagnosis not present

## 2018-08-05 DIAGNOSIS — Z79899 Other long term (current) drug therapy: Secondary | ICD-10-CM | POA: Insufficient documentation

## 2018-08-05 DIAGNOSIS — Z77120 Contact with and (suspected) exposure to mold (toxic): Secondary | ICD-10-CM

## 2018-08-05 NOTE — ED Provider Notes (Signed)
[facility] DEPT Provider Note   CSN: [id number] Arrival date & time: 08/05/18  1025     History   Chief Complaint No chief complaint on file.   HPI [name] is a 67 y.o. female.  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  She reports that in May 2018, she had Aspergillus in her crawlspace which was eradicated, but she feels that it keeps coming back.  She reports that she is continued to have environmental workers come out to the house to eradicate it, but she does not think that it is gone.  She reports that they keep telling her that everything is fine, but she feels it is not.  Patient reports her symptoms include generalized malaise, occasional shortness of breath, nonproductive cough, feeling that she does not have energy to walk, and feeling "weak knee'ed".  She reports that her primary care provider has done an extensive work-up and referred here to neurology, gastroenterology, and allergy and immunology.  They have all done independent work-ups and are treating her.  Patient reports that she is recently had her TSH checked.  Patient reports that she is continually told by all of her providers that she is fine and she is adamant that she does not have a "emotional disorder".  Patient reports that she does not believe she is anxious or depressed.  Patient reports that she cannot take antidepressants as she feels that they cause her to feel like she has a "stack of bricks around my feet".  She reports that her primary care provider has tried to prescribe antidepressants, but she does not think this is her problem.  Patient was treated with prednisone last week, reports she had marginal improvement in her symptoms and energy state, however she feels that she is worsened again.  Past Medical History:  Diagnosis Date  . Arthritis     . Cervical spinal stenosis   . Complication of anesthesia    "makes me cry for days."  . Diverticulitis   . Family history of anesthesia complication    Sister has nausea and vomitting.  Marland Kitchen Hypercholesteremia   . Hypertension     Patient Active Problem List   Diagnosis Date Noted  . Vitamin D deficiency 01/02/2016  . Arthropathy 01/02/2016  . Tremor 01/02/2016  . Primary generalized (osteo)arthritis 01/02/2016  . Cigarette nicotine dependence, uncomplicated 67/34/1939  . Female climacteric state 01/02/2016  . Hyperglycemia 01/02/2016  . Essential (primary) hypertension 01/02/2016  . Elevated white blood cell count 01/02/2016  . 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 Cervicalgia 01/02/2016  . Radiculopathy of cervical region 01/02/2016  . Essential tremor 01/02/2016  . Anxiety disorder 01/02/2016  . Pure hypercholesterolemia 01/02/2016  . Weakness 01/02/2016  . Fatigue 01/02/2016  . HLD (hyperlipidemia) 01/02/2016  . Body mass index (BMI) of 35.0-35.9 in adult 08/28/2014    Past Surgical History:  Procedure Laterality Date  . ABDOMINAL HYSTERECTOMY  1995  . ANTERIOR CERVICAL DECOMP/DISCECTOMY FUSION  12/30/11  . BACK SURGERY  1992   . COLECTOMY  2004   "lost 1/3 of my colon"  . THORACIC FUSION  1992  . TUMOR REMOVAL     small intestine     OB History   No obstetric history on file.      Home Medications    Prior to Admission medications   Medication Sig Start  Date End Date Taking? Authorizing Provider  ciprofloxacin (CIPRO) 500 MG tablet Take 1 tablet (500 mg total) by mouth 2 (two) times daily. 07/12/18  Yes Lawyer, Harrell Gave, PA-C  HYDROcodone-acetaminophen (NORCO) 5-325 MG tablet Take 1-2 tablets by mouth every 6 (six) hours as needed. Patient taking differently: Take 0.5 tablets by mouth every 6 (six) hours as needed for moderate pain.  08/03/15  Yes Delo, Nathaneil Canary, MD  metroNIDAZOLE (FLAGYL) 500 MG tablet Take 1 tablet  (500 mg total) by mouth 2 (two) times daily. 07/12/18  Yes Lawyer, Harrell Gave, PA-C  mupirocin ointment (BACTROBAN) 2 % Place 1 application into the nose 2 (two) times daily. Patient taking differently: Place 1 application into the nose daily.  05/18/18  Yes Valentina Shaggy, MD  predniSONE (DELTASONE) 10 MG tablet Take 4 tablets (40 mg total) by mouth daily for 3 days, THEN 2 tablets (20 mg total) daily for 3 days, THEN 1 tablet (10 mg total) daily for 3 days. 08/02/18 08/11/18 Yes Duffy Bruce, MD  acetaminophen (TYLENOL) 500 MG tablet Take 500 mg by mouth every 6 (six) hours as needed for mild pain, moderate pain, fever or headache.    [provider]  ALPRAZolam Duanne Moron) 0.5 MG tablet Take 0.25 tablets by mouth every 6 (six) hours as needed for anxiety or sleep. anxiety 02/20/15   [provider]  calcium carbonate (OS-CAL - DOSED IN MG OF ELEMENTAL CALCIUM) 1250 (500 Ca) MG tablet Take 1 tablet by mouth 3 (three) times a week.    [provider]  cetirizine (ZYRTEC) 10 MG tablet Take 10 mg by mouth daily.    [provider]  Cholecalciferol (VITAMIN D) 50 MCG (2000 UT) tablet Take 2,000 Units by mouth 3 (three) times a week.     [provider]  ferrous sulfate 325 (65 FE) MG tablet Take 325 mg by mouth 3 (three) times a week.    [provider]  fexofenadine (ALLEGRA) 180 MG tablet Take 180 mg by mouth daily.    [provider]  glucosamine-chondroitin 500-400 MG tablet Take 1 tablet by mouth 3 (three) times a week.    [provider]  hydrocortisone (ANUSOL-HC) 25 MG suppository Place 25 mg rectally daily as needed for hemorrhoids or anal itching.    [provider]  ketoconazole (NIZORAL) 2 % shampoo Apply 1 application topically 2 (two) times a week.    [provider]  losartan (COZAAR) 25 MG tablet Take 25 mg by mouth daily.  03/27/18   [provider]  ondansetron (ZOFRAN ODT) 4 MG  disintegrating tablet Take 1 tablet (4 mg total) by mouth every 8 (eight) hours as needed for nausea or vomiting. Patient not taking: Reported on 07/25/2018 07/13/18   Lorayne Bender, PA-C  primidone (MYSOLINE) 50 MG tablet Take 1 tablet (50 mg total) by mouth at bedtime. Patient not taking: Reported on 05/04/2018 12/15/17   Tat, Eustace Quail, DO  traMADol (ULTRAM) 50 MG tablet Take 1 tablet (50 mg total) by mouth every 6 (six) hours as needed for severe pain. Patient not taking: Reported on 08/02/2018 07/12/18   Dalia Heading, PA-C    Family History Family History  Problem Relation Age of Onset  . Anesthesia problems Sister   . Allergic rhinitis Sister   . Food Allergy Sister        shellfish  . Angioedema Neg Hx   . Asthma Neg Hx   . Eczema Neg Hx   . Urticaria Neg  Hx     Social History Social History   Tobacco Use  . Smoking status: Current Every Day Smoker    Packs/day: 1.00    Years: 40.00    Pack years: 40.00    Types: Cigarettes  . Smokeless tobacco: Never Used  . Tobacco comment: pt states she has not smoked since 07/26/18  Substance Use Topics  . Alcohol use: No    Alcohol/week: 0.0 standard drinks  . Drug use: No     Allergies   Lyrica [pregabalin]; Simvastatin; Statins; and Meloxicam   Review of Systems Review of Systems  Constitutional: Positive for activity change and unexpected weight change. Negative for chills and fever.  HENT: Negative for congestion, sinus pain and sore throat.   Eyes: Negative for visual disturbance.  Respiratory: Negative for cough, chest tightness and shortness of breath.   Cardiovascular: Negative for chest pain, palpitations and leg swelling.  Gastrointestinal: Positive for nausea. Negative for abdominal pain and vomiting.  Genitourinary: Negative for dysuria and flank pain.  Musculoskeletal: Negative for back pain and myalgias.  Skin: Negative for rash.  Neurological: Positive for weakness. Negative for dizziness, syncope,  light-headedness and headaches.       +Generalized weakness.      Physical Exam Updated Vital Signs BP (!) 158/88 (BP Location: Left Arm)   Pulse 71   Temp 98.5 F (36.9 C) (Oral)   Resp 18   Ht 5\' 5"  (1.651 m)   Wt 97 kg   SpO2 96%   BMI 35.59 kg/m   Physical Exam Vitals signs and nursing note reviewed.  Constitutional:      General: She is not in acute distress.    Appearance: She is well-developed.  HENT:     Head: Normocephalic and atraumatic.     Nose: Nose normal.     Mouth/Throat:     Mouth: Mucous membranes are moist.     Pharynx: No posterior oropharyngeal erythema.  Eyes:     Conjunctiva/sclera: Conjunctivae normal.     Pupils: Pupils are equal, round, and reactive to light.  Neck:     Musculoskeletal: Normal range of motion and neck supple.  Cardiovascular:     Rate and Rhythm: Normal rate and regular rhythm.     Heart sounds: S1 normal and S2 normal. No murmur.  Pulmonary:     Effort: Pulmonary effort is normal.     Breath sounds: Normal breath sounds. No wheezing or rales.  Abdominal:     General: There is no distension.     Palpations: Abdomen is soft.     Tenderness: There is no abdominal tenderness. There is no guarding.  Musculoskeletal: Normal range of motion.        General: No deformity.  Lymphadenopathy:     Cervical: No cervical adenopathy.  Skin:    General: Skin is warm and dry.     Findings: No erythema or rash.  Neurological:     Mental Status: She is alert.     Comments: Cranial nerves grossly intact. Patient moves extremities symmetrically and with good coordination. Normal and symmetric gait.  Psychiatric:        Behavior: Behavior normal.        Thought Content: Thought content normal.        Judgment: Judgment normal.      ED Treatments / Results  Labs (all labs ordered are listed, but only abnormal results are displayed) Labs Reviewed  URINALYSIS, ROUTINE W REFLEX MICROSCOPIC    EKG  None  Radiology No results  found.  Procedures Procedures (including critical care time)  Medications Ordered in ED Medications - No data to display   Initial Impression / Assessment and Plan / ED Course  I have reviewed the triage vital signs and the nursing notes.  Pertinent labs & imaging results that were available during my care of the patient were reviewed by me and considered in my medical decision making (see chart for details).     Patient is well-appearing and in no acute distress.  Patient has had extensive outpatient work-up for her symptoms, and presents with no acute changes today.  She is continuing to report that she feels "poisoned".  She denies any SI-HI-AVH.  Previous work-up was reviewed, and it appears complete.  Patient does have some records that are unavailable from primary care provider, but she has close follow-up with her PCP next week.  I recommend patient maintain this appointment and discuss any further work-up needed.  I had extensive discussion with the patient regarding the natural history of environmental mold exposure, as well as the manifestations of invasive aspergillosis and provided reassurance that she is not immune compromised or otherwise showing signs of invasive aspergillosis.  Continue to discuss with the patient that while we will continue to work-up her physical symptoms, I did recommend that she work closely with her primary care provider regarding any possible depression or anxiety.  Patient reports she is adamant that she does not have depression or anxiety, but I did provide reassurance to the patient that this can occur concomitantly with physical disease.  Patient is in understanding and agreement with plan of care, and agrees to outpatient follow-up is reasonable.  I encouraged the patient to return for any new or worsening symptoms.  This is a supervised visit with Dr. Pattricia Boss. Evaluation, management, and discharge planning discussed with this attending  physician.  Final Clinical Impressions(s) / ED Diagnoses   Final diagnoses:  Mold exposure    ED Discharge Orders    None       Tamala Julian 08/05/18 1426    Pattricia Boss, MD 08/11/18 [id number]

## 2018-08-05 NOTE — ED Triage Notes (Signed)
Patient presented to ed today stating that she is poison. 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 Patient appears anxious and keep repeating "I am poison really bad". Patient bring in a letter that is from May of 2018 that had preliminary finding of her house air quality.

## 2018-08-05 NOTE — Discharge Instructions (Signed)
Please see the information and instructions below regarding your visit.  Your diagnoses today include:  1. Mold exposure     Tests performed today include: See side panel of your discharge paperwork for testing performed today. Vital signs are listed at the bottom of these instructions.   I reviewed your records, it appears that your work-up has been extensive and complete.  It is very reassuring.  Unfortunately, any further testing that you may need is not available in the emergency department, as your symptoms appear to be recurrent issues.  If any new or worsening symptoms emerge, we do want to see you back in the emergency department immediately for testing.  Medications prescribed:    Take any prescribed medications only as prescribed, and any over the counter medications only as directed on the packaging.  Home care instructions:  Please follow any educational materials contained in this packet.   Follow-up instructions: Please follow-up with your primary care provider in next week for further evaluation of your symptoms if they are not completely improved.    Return instructions:  Please return to the Emergency Department if you experience worsening symptoms.  Please return to the emergency department immediately for any episodes of passing out or near passing out, headaches, chest pain, shortness of breath, abdominal pain, nausea vomiting the prevention keep anything down, or sudden weakness.  Please return if you have any other emergent concerns.  Additional Information:   Your vital signs today were: BP (!) 158/88 (BP Location: Left Arm)    Pulse 71    Temp 98.5 F (36.9 C) (Oral)    Resp 18    Ht 5\' 5"  (1.651 m)    Wt 97 kg    SpO2 96%    BMI 35.59 kg/m  If your blood pressure (BP) was elevated on multiple readings during this visit above 130 for the top number or above 80 for the bottom number, please have this repeated by your primary care provider within one  month. --------------  Thank you for allowing [place] to participate in your care today.

## 2018-08-07 ENCOUNTER — Emergency Department (HOSPITAL_COMMUNITY): Payer: Medicare Other

## 2018-08-07 ENCOUNTER — Emergency Department (HOSPITAL_COMMUNITY)
Admission: EM | Admit: 2018-08-07 | Discharge: 2018-08-07 | Disposition: A | Discharge status: Home / Self Care | Payer: Medicare Other | Attending: Emergency Medicine | Admitting: Emergency Medicine

## 2018-08-07 ENCOUNTER — Encounter (HOSPITAL_COMMUNITY): Payer: Self-pay | Admitting: Emergency Medicine

## 2018-08-07 DIAGNOSIS — R531 Weakness: Secondary | ICD-10-CM | POA: Diagnosis not present

## 2018-08-07 DIAGNOSIS — Z711 Person with feared health complaint in whom no diagnosis is made: Secondary | ICD-10-CM | POA: Diagnosis not present

## 2018-08-07 DIAGNOSIS — R05 Cough: Secondary | ICD-10-CM | POA: Diagnosis not present

## 2018-08-07 DIAGNOSIS — R0602 Shortness of breath: Secondary | ICD-10-CM | POA: Diagnosis not present

## 2018-08-07 DIAGNOSIS — I10 Essential (primary) hypertension: Secondary | ICD-10-CM | POA: Diagnosis not present

## 2018-08-07 DIAGNOSIS — F17210 Nicotine dependence, cigarettes, uncomplicated: Secondary | ICD-10-CM | POA: Diagnosis not present

## 2018-08-07 DIAGNOSIS — R457 State of emotional shock and stress, unspecified: Secondary | ICD-10-CM | POA: Diagnosis not present

## 2018-08-07 LAB — URINALYSIS, ROUTINE W REFLEX MICROSCOPIC
BILIRUBIN URINE: NEGATIVE
Glucose, UA: NEGATIVE mg/dL
Hgb urine dipstick: NEGATIVE
Ketones, ur: NEGATIVE mg/dL
Leukocytes, UA: NEGATIVE
Nitrite: NEGATIVE
PH: 5.5 (ref 5.0–8.0)
Protein, ur: NEGATIVE mg/dL
Specific Gravity, Urine: 1.025 (ref 1.005–1.030)

## 2018-08-07 LAB — RAPID URINE DRUG SCREEN, HOSP PERFORMED
Amphetamines: NOT DETECTED
Barbiturates: NOT DETECTED
Benzodiazepines: POSITIVE — AB
Cocaine: NOT DETECTED
Opiates: NOT DETECTED
TETRAHYDROCANNABINOL: POSITIVE — AB

## 2018-08-07 LAB — CBC WITH DIFFERENTIAL/PLATELET
Abs Immature Granulocytes: 0.09 10*3/uL — ABNORMAL HIGH (ref 0.00–0.07)
Basophils Absolute: 0.1 10*3/uL (ref 0.0–0.1)
Basophils Relative: 1 %
Eosinophils Absolute: 0.1 10*3/uL (ref 0.0–0.5)
Eosinophils Relative: 1 %
HCT: 47.5 % — ABNORMAL HIGH (ref 36.0–46.0)
Hemoglobin: 15 g/dL (ref 12.0–15.0)
Immature Granulocytes: 1 %
Lymphocytes Relative: 30 %
Lymphs Abs: 4.7 10*3/uL — ABNORMAL HIGH (ref 0.7–4.0)
MCH: 31.5 pg (ref 26.0–34.0)
MCHC: 31.6 g/dL (ref 30.0–36.0)
MCV: 99.8 fL (ref 80.0–100.0)
MONOS PCT: 8 %
Monocytes Absolute: 1.3 10*3/uL — ABNORMAL HIGH (ref 0.1–1.0)
Neutro Abs: 9.3 10*3/uL — ABNORMAL HIGH (ref 1.7–7.7)
Neutrophils Relative %: 59 %
Platelets: 264 10*3/uL (ref 150–400)
RBC: 4.76 MIL/uL (ref 3.87–5.11)
RDW: 13.4 % (ref 11.5–15.5)
WBC: 15.6 10*3/uL — ABNORMAL HIGH (ref 4.0–10.5)
nRBC: 0 % (ref 0.0–0.2)

## 2018-08-07 LAB — COMPREHENSIVE METABOLIC PANEL
ALT: 18 U/L (ref 0–44)
AST: 17 U/L (ref 15–41)
Albumin: 3.2 g/dL — ABNORMAL LOW (ref 3.5–5.0)
Alkaline Phosphatase: 32 U/L — ABNORMAL LOW (ref 38–126)
Anion gap: 10 (ref 5–15)
BUN: 24 mg/dL — ABNORMAL HIGH (ref 8–23)
CALCIUM: 8.9 mg/dL (ref 8.9–10.3)
CO2: 22 mmol/L (ref 22–32)
Chloride: 107 mmol/L (ref 98–111)
Creatinine, Ser: 0.86 mg/dL (ref 0.44–1.00)
GFR calc Af Amer: >60 mL/min (ref >60)
Glucose, Bld: 98 mg/dL (ref 70–99)
Potassium: 4.1 mmol/L (ref 3.5–5.1)
Sodium: 139 mmol/L (ref 135–145)
Total Bilirubin: 0.8 mg/dL (ref 0.3–1.2)
Total Protein: 7.3 g/dL (ref 6.5–8.1)

## 2018-08-07 LAB — ACETAMINOPHEN LEVEL: Acetaminophen (Tylenol), Serum: <10 ug/mL — ABNORMAL LOW (ref 10–30)

## 2018-08-07 LAB — SALICYLATE LEVEL: Salicylate Lvl: <7 mg/dL (ref 2.8–30.0)

## 2018-08-07 LAB — ETHANOL

## 2018-08-07 NOTE — ED Triage Notes (Signed)
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 Pt states that she "cannot move and cannot breathe" she also states this has been "going on for years." ems reports that pts family initially called out stating patient was altered but upon their arrival pt was a/ox4. Pt crying and appearing anxious upon arrival. resp e/u, nad.

## 2018-08-07 NOTE — ED Notes (Signed)
Got patient on the monitor did ekg shown to Dr Laverta Baltimore patient is resting with call bell in reach

## 2018-08-07 NOTE — ED Notes (Signed)
TTS contacted this RN to state that patient is cleared by Psych, Dr. Laverta Baltimore made aware

## 2018-08-07 NOTE — ED Notes (Signed)
Dr. Laverta Baltimore at bedside speaking with patient and her sister

## 2018-08-07 NOTE — ED Notes (Signed)
Patient stating that her medications from home are missing, the patient was advised by this RN and Tray, RN that no meds were brought with her when she arrived by ambulance. Patient stating that she is sure she brought her meds onto the ambulance with her.

## 2018-08-07 NOTE — ED Notes (Signed)
Patient transported to X-ray 

## 2018-08-07 NOTE — ED Notes (Signed)
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 Dr. Laverta Baltimore made aware of requests.

## 2018-08-07 NOTE — ED Provider Notes (Signed)
Emergency Department Provider Note   I have reviewed the triage vital signs and the nursing notes.   HISTORY  Chief Complaint generalized weakness   HPI [name] is a 67 y.o. female with PMH of arthritis, HLD, HTN, and OA returns to the emergency department with concern for black mold exposure.  Patient states "I'm poisoned and dying and no one can help me."  Patient has a paper at bedside describing various household allergens.  Patient states "i've been living in this for years and I'm poisoned." She reports feeling like mold is all around her both inside and outside. When she opens the front door she notices green mold spots developing inside as soon as the door opens. She has had works deal with mold in the house but is concerned about additional mold outside. She also feels like there is mold under the tub and it is spreading up the sides of the tub. She denies any AH. She states that she has been staying with her daughter over the past couple of days but is concerned that she may be infecting her as well with mold. Patient states that her pain symptom is weakness. Denies SOB, CP. Has noted a chronic cough.    Past Medical History:  Diagnosis Date  . Arthritis   . Cervical spinal stenosis   . Complication of anesthesia    "makes me cry for days."  . Diverticulitis   . Family history of anesthesia complication    Sister has nausea and vomitting.  Marland Kitchen Hypercholesteremia   . Hypertension     Patient Active Problem List   Diagnosis Date Noted  . Vitamin D deficiency 01/02/2016  . Arthropathy 01/02/2016  . Tremor 01/02/2016  . Primary generalized (osteo)arthritis 01/02/2016  . Cigarette nicotine dependence, uncomplicated 16/05/9603  . Female climacteric state 01/02/2016  . Hyperglycemia 01/02/2016  . Essential (primary) hypertension 01/02/2016  . Elevated white blood cell count 01/02/2016  . 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  Cervicalgia 01/02/2016  . Radiculopathy of cervical region 01/02/2016  . Essential tremor 01/02/2016  . Anxiety disorder 01/02/2016  . Pure hypercholesterolemia 01/02/2016  . Weakness 01/02/2016  . Fatigue 01/02/2016  . HLD (hyperlipidemia) 01/02/2016  . Body mass index (BMI) of 35.0-35.9 in adult 08/28/2014    Past Surgical History:  Procedure Laterality Date  . ABDOMINAL HYSTERECTOMY  1995  . ANTERIOR CERVICAL DECOMP/DISCECTOMY FUSION  12/30/11  . BACK SURGERY  1992   . COLECTOMY  2004   "lost 1/3 of my colon"  . THORACIC FUSION  1992  . TUMOR REMOVAL     small intestine   Allergies Lyrica [pregabalin]; Simvastatin; Statins; and Meloxicam  Family History  Problem Relation Age of Onset  . Anesthesia problems Sister   . Allergic rhinitis Sister   . Food Allergy Sister        shellfish  . Angioedema Neg Hx   . Asthma Neg Hx   . Eczema Neg Hx   . Urticaria Neg Hx     Social History Social History   Tobacco Use  . Smoking status: Current Every Day Smoker    Packs/day: 1.00    Years: 40.00    Pack years: 40.00    Types: Cigarettes  . Smokeless tobacco: Never Used  . Tobacco comment: pt states she has not smoked since 07/26/18  Substance Use Topics  . Alcohol use: No    Alcohol/week: 0.0 standard drinks  . Drug use: No  Review of Systems  Constitutional: No fever/chills. Positive generalized weakness.  Eyes: No visual changes. ENT: No sore throat. Cardiovascular: Denies chest pain. Respiratory: Denies shortness of breath. Gastrointestinal: No abdominal pain.  No nausea, no vomiting.  No diarrhea.  No constipation. Genitourinary: Negative for dysuria. Musculoskeletal: Negative for back pain. Skin: Negative for rash. Neurological: Negative for headaches, focal weakness or numbness.  10-point ROS otherwise negative.  ____________________________________________   PHYSICAL EXAM:  VITAL SIGNS: ED Triage Vitals  Enc Vitals Group     BP 08/07/18 1002  (!) 156/77     Pulse Rate 08/07/18 1002 67     Resp 08/07/18 1002 16     Temp 08/07/18 1002 97.9 F (36.6 C)     Temp Source 08/07/18 1002 Oral     SpO2 08/07/18 1002 97 %     Pain Score 08/07/18 1000 0   Constitutional: Alert and oriented. Patient tearful at times. No acute distress.  Eyes: Conjunctivae are normal. Head: Atraumatic. Nose: No congestion/rhinnorhea. Mouth/Throat: Mucous membranes are moist.  Oropharynx non-erythematous. Neck: No stridor.  Cardiovascular: Normal rate, regular rhythm. Good peripheral circulation. Grossly normal heart sounds.   Respiratory: Normal respiratory effort.  No retractions. Lungs CTAB. Gastrointestinal: Soft and nontender. No distention.  Musculoskeletal: No lower extremity tenderness nor edema. No gross deformities of extremities. Neurologic:  Normal speech and language. No gross focal neurologic deficits are appreciated.  Skin:  Skin is warm, dry and intact. No rash noted. Psychiatric: Speech is normal. Not responding to internal stimuli. Patient is tearful at times.   ____________________________________________   LABS (all labs ordered are listed, but only abnormal results are displayed)  Labs Reviewed  COMPREHENSIVE METABOLIC PANEL - Abnormal; Notable for the following components:      Result Value   BUN 24 (*)    Albumin 3.2 (*)    Alkaline Phosphatase 32 (*)    All other components within normal limits  ACETAMINOPHEN LEVEL - Abnormal; Notable for the following components:   Acetaminophen (Tylenol), Serum <10 (*)    All other components within normal limits  CBC WITH DIFFERENTIAL/PLATELET - Abnormal; Notable for the following components:   WBC 15.6 (*)    HCT 47.5 (*)    Neutro Abs 9.3 (*)    Lymphs Abs 4.7 (*)    Monocytes Absolute 1.3 (*)    Abs Immature Granulocytes 0.09 (*)    All other components within normal limits  RAPID URINE DRUG SCREEN, HOSP PERFORMED - Abnormal; Notable for the following components:    Benzodiazepines POSITIVE (*)    Tetrahydrocannabinol POSITIVE (*)    All other components within normal limits  ETHANOL  SALICYLATE LEVEL  URINALYSIS, ROUTINE W REFLEX MICROSCOPIC   ____________________________________________  EKG   EKG Interpretation  Date/Time:  Monday August 07 2018 09:59:10 EST Ventricular Rate:  66 PR Interval:    QRS Duration: 87 QT Interval:  381 QTC Calculation: 400 R Axis:   22 Text Interpretation:  Sinus rhythm Anteroseptal infarct, age indeterminate No STEMI.  Confirmed by Nanda Quinton [phone]) on 08/07/2018 10:20:34 AM       ____________________________________________  RADIOLOGY  Dg Chest 2 View  Result Date: 08/07/2018 CLINICAL DATA:  Shortness of breath, cough EXAM: CHEST - 2 VIEW COMPARISON:  08/02/2018 FINDINGS: Heart and mediastinal contours are within normal limits. No focal opacities or effusions. No acute bony abnormality. IMPRESSION: No active cardiopulmonary disease. Electronically Signed   By: Rolm Baptise M.D.   On: 08/07/2018 11:37    ____________________________________________  PROCEDURES  Procedure(s) performed:   Procedures  None ____________________________________________   INITIAL IMPRESSION / ASSESSMENT AND PLAN / ED COURSE  Pertinent labs & imaging results that were available during my care of the patient were reviewed by me and considered in my medical decision making (see chart for details).  Patient returns to the emergency department for evaluation of mold exposure.  This is her third visit to the emergency department this month regarding this concern.  Patient is tearful and describes to me feeling that not only is mold taken over her house but she is concerned about mold outside as well and describes seeing green spots in mold collection around her house when she opens her front door.  She tells me that she is recently spent some time at her daughter's house but has concerns about spreading the mold to  her house.  Patient has been evaluated by an allergist within the last month for this issue but states "they did not test for the right mold."  She is tearful and very distressed.  Plan for screening labs and chest x-ray.  EKG normal.  No concern for acute acute coronary syndrome.  No neurological deficits.  Patient may benefit from speaking with our psychiatry team regarding her acute anxiety symptoms and increasingly obsessive thinking.   Labs reviewed. CXR with no acute findings. No UTI. TTS evaluated the patient and clear the patient from psych perspective. Patient now describing concern that a tumor is leaking poison into her body. I discussed the labs and imaging with patient and sister at bedside. No further emergent w/u but it will be imperative to f/u with PCP moving forward. Discussed this with the patient in detail.  ____________________________________________  FINAL CLINICAL IMPRESSION(S) / ED DIAGNOSES  Final diagnoses:  Generalized weakness     Note:  This document was prepared using Dragon voice recognition software and may include unintentional dictation errors.  Nanda Quinton, MD Emergency Medicine    Edwing Figley, [facility], MD 08/07/18 [phone]

## 2018-08-07 NOTE — Discharge Instructions (Signed)
You were seen in the ED today with weakness. You will need to call your PCP today and schedule the next available appointment. Return to the ED with any chest pain, shortness of breathing, worsening weakness, fever, chills, or other concerning symptoms.

## 2018-08-07 NOTE — BH Assessment (Signed)
Tele Assessment Note   Patient Name: [name] MRN: [id number] Referring Physician: Dr. Laverta Baltimore Location of Patient: [facility] Location of Provider: [place] is an 66 y.o. female. Pt denies SI/HI and AVH. Pt states she is experiencing physical health concerns and she believed there was  mold  in her home but now she feels there is something physically going on in her body. 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 The Pt believes that something similar to the above referenced could possibly be occurring again. The Pt denies previous psych history. Pt denies previous SI attempts. Pt denies previous hospitalizations.   Jennifer Nutting, NP recommends D/C and follow-up with physical health physician.   Diagnosis:   F41.1 GAD  Past Medical History:  Past Medical History:  Diagnosis Date  . Arthritis   . Cervical spinal stenosis   . Complication of anesthesia    "makes me cry for days."  . Diverticulitis   . Family history of anesthesia complication    Sister has nausea and vomitting.  Marland Kitchen Hypercholesteremia   . Hypertension     Past Surgical History:  Procedure Laterality Date  . ABDOMINAL HYSTERECTOMY  1995  . ANTERIOR CERVICAL DECOMP/DISCECTOMY FUSION  12/30/11  . BACK SURGERY  1992   . COLECTOMY  2004   "lost 1/3 of my colon"  . THORACIC FUSION  1992  . TUMOR REMOVAL     small intestine    Family History:  Family History  Problem Relation Age of Onset  . Anesthesia problems Sister   . Allergic rhinitis Sister   . Food Allergy Sister        shellfish  . Angioedema Neg Hx   . Asthma Neg Hx   . Eczema Neg Hx   . Urticaria Neg Hx     Social History:  reports that she has been smoking cigarettes. She has a 40.00 pack-year smoking history. She has never used smokeless tobacco. She reports that she does not drink alcohol or use drugs.  Additional Social History:  Alcohol / Drug Use Pain Medications: please see  mar Prescriptions: please see mar Over the Counter: please see mar History of alcohol / drug use?: No history of alcohol / drug abuse Longest period of sobriety (when/how long): NA  CIWA: CIWA-Ar BP: (!) 144/88 Pulse Rate: 62 COWS:    Allergies:  Allergies  Allergen Reactions  . Lyrica [Pregabalin] Other (See Comments)    Made symptoms worse  . Simvastatin Other (See Comments)    Myalgias  . Statins Other (See Comments)    Myalgias  . Meloxicam Other (See Comments)    Makes her sweat and muscle cramps     Home Medications: (Not in a hospital admission)   OB/GYN Status:  No LMP recorded. Patient has had a hysterectomy.  General Assessment Data Location of Assessment: [facility] ED TTS Assessment: In system Is this a Tele or Face-to-Face Assessment?: Tele Assessment Is this an Initial Assessment or a Re-assessment for this encounter?: Initial Assessment Patient Accompanied by:: N/A Language Other than English: No Living Arrangements: Other (Comment) What gender do you identify as?: Female Marital status: Single Maiden name: NA     Crisis Care Plan Legal Guardian: Other:(self) Name of Psychiatrist: NA Name of Therapist: NA  Education Status Is patient currently in school?: No Is the patient employed, unemployed or receiving disability?: Unemployed  Risk to self with the past 6 months  Suicidal Ideation: No Has patient been a risk to self within the past 6 months prior to admission? : No Suicidal Intent: No Has patient had any suicidal intent within the past 6 months prior to admission? : No Is patient at risk for suicide?: No Suicidal Plan?: No Has patient had any suicidal plan within the past 6 months prior to admission? : No Access to Means: No What has been your use of drugs/alcohol within the last 12 months?: NA Previous Attempts/Gestures: No How many times?: 0 Other Self Harm Risks: NA Triggers for Past Attempts: None known Intentional Self Injurious  Behavior: None Family Suicide History: No Recent stressful life event(s): Trauma (Comment) Persecutory voices/beliefs?: No Depression: Yes Depression Symptoms: Isolating, Fatigue, Feeling angry/irritable Substance abuse history and/or treatment for substance abuse?: No Suicide prevention information given to non-admitted patients: Not applicable  Risk to Others within the past 6 months Homicidal Ideation: No Does patient have any lifetime risk of violence toward others beyond the six months prior to admission? : No Thoughts of Harm to Others: No Current Homicidal Intent: No Current Homicidal Plan: No Access to Homicidal Means: No Identified Victim: NA History of harm to others?: No Assessment of Violence: None Noted Violent Behavior Description: NA Does patient have access to weapons?: No Criminal Charges Pending?: No Does patient have a court date: No Is patient on probation?: No  Psychosis Hallucinations: None noted Delusions: None noted  Mental Status Report Appearance/Hygiene: Unremarkable Eye Contact: Fair Motor Activity: Freedom of movement Speech: Logical/coherent Level of Consciousness: Alert Mood: Irritable Affect: Irritable Anxiety Level: Moderate Thought Processes: Coherent, Relevant Judgement: Unimpaired Orientation: Person, Place, Time, Situation Obsessive Compulsive Thoughts/Behaviors: None  Cognitive Functioning Concentration: Normal Memory: Recent Intact, Remote Intact Is patient IDD: No Insight: Fair Impulse Control: Fair Appetite: Fair Have you had any weight changes? : No Change Sleep: No Change Total Hours of Sleep: 8 Vegetative Symptoms: None  ADLScreening [facility] Assessment Services) Patient's cognitive ability adequate to safely complete daily activities?: Yes Patient able to express need for assistance with ADLs?: Yes Independently performs ADLs?: Yes (appropriate for developmental age)  Prior Inpatient Therapy Prior Inpatient Therapy:  No  Prior Outpatient Therapy Prior Outpatient Therapy: No Does patient have an ACCT team?: No Does patient have Intensive In-House Services?  : No Does patient have [facility] services? : No Does patient have P4CC services?: No  ADL Screening (condition at time of admission) Patient's cognitive ability adequate to safely complete daily activities?: Yes Is the patient deaf or have difficulty hearing?: No Does the patient have difficulty seeing, even when wearing glasses/contacts?: No Does the patient have difficulty concentrating, remembering, or making decisions?: No Patient able to express need for assistance with ADLs?: Yes Does the patient have difficulty dressing or bathing?: No Independently performs ADLs?: Yes (appropriate for developmental age)       Abuse/Neglect Assessment (Assessment to be complete while patient is alone) Abuse/Neglect Assessment Can Be Completed: Yes Physical Abuse: Denies Verbal Abuse: Denies Sexual Abuse: Denies Exploitation of patient/patient's resources: Denies     Regulatory affairs officer (For Healthcare) Does Patient Have a Medical Advance Directive?: No Does patient want to make changes to medical advance directive?: No - Patient declined Nutrition Screen- [facility] Adult/[facility]/AP Patient's home diet: Total nutrient admixture        Disposition:  Disposition Initial Assessment Completed for this Encounter: Yes  This service was provided via telemedicine using a 2-way, interactive audio and video technology.  Names of all persons participating in this telemedicine service  and their role in this encounter. Name: Ricky Ala Role: NP  Name:  Role:   Name:  Role:   Name:  Role:     Cyndia Bent 08/07/2018 1:24 PM

## 2018-08-07 NOTE — ED Notes (Signed)
Patient pulled into a room to be assessed by TTS. TTS machine at bedside and patient speaking with counselor at this time

## 2018-08-07 NOTE — ED Notes (Signed)
Upon patient's sister's arrival, she confirmed that patient's meds were not brought to the hospital and remained at her house.

## 2018-08-11 DIAGNOSIS — R5382 Chronic fatigue, unspecified: Secondary | ICD-10-CM | POA: Diagnosis not present

## 2018-08-11 DIAGNOSIS — I10 Essential (primary) hypertension: Secondary | ICD-10-CM | POA: Diagnosis not present

## 2018-08-11 DIAGNOSIS — D72829 Elevated white blood cell count, unspecified: Secondary | ICD-10-CM | POA: Diagnosis not present

## 2018-08-11 DIAGNOSIS — G250 Essential tremor: Secondary | ICD-10-CM | POA: Diagnosis not present

## 2018-08-12 ENCOUNTER — Other Ambulatory Visit: Payer: Self-pay

## 2018-08-12 ENCOUNTER — Emergency Department (HOSPITAL_COMMUNITY)
Admission: EM | Admit: 2018-08-12 | Discharge: 2018-08-12 | Disposition: A | Discharge status: Home / Self Care | Payer: Medicare Other | Attending: Emergency Medicine | Admitting: Emergency Medicine

## 2018-08-12 ENCOUNTER — Emergency Department (HOSPITAL_COMMUNITY): Payer: Medicare Other

## 2018-08-12 ENCOUNTER — Encounter (HOSPITAL_COMMUNITY): Payer: Self-pay | Admitting: Emergency Medicine

## 2018-08-12 DIAGNOSIS — F17210 Nicotine dependence, cigarettes, uncomplicated: Secondary | ICD-10-CM | POA: Diagnosis not present

## 2018-08-12 DIAGNOSIS — K429 Umbilical hernia without obstruction or gangrene: Secondary | ICD-10-CM | POA: Diagnosis not present

## 2018-08-12 DIAGNOSIS — K5900 Constipation, unspecified: Secondary | ICD-10-CM | POA: Diagnosis not present

## 2018-08-12 DIAGNOSIS — D72829 Elevated white blood cell count, unspecified: Secondary | ICD-10-CM | POA: Diagnosis not present

## 2018-08-12 DIAGNOSIS — R1084 Generalized abdominal pain: Secondary | ICD-10-CM | POA: Diagnosis not present

## 2018-08-12 DIAGNOSIS — Z79899 Other long term (current) drug therapy: Secondary | ICD-10-CM | POA: Insufficient documentation

## 2018-08-12 DIAGNOSIS — R109 Unspecified abdominal pain: Secondary | ICD-10-CM | POA: Diagnosis present

## 2018-08-12 DIAGNOSIS — K5730 Diverticulosis of large intestine without perforation or abscess without bleeding: Secondary | ICD-10-CM | POA: Diagnosis not present

## 2018-08-12 DIAGNOSIS — I10 Essential (primary) hypertension: Secondary | ICD-10-CM | POA: Diagnosis not present

## 2018-08-12 LAB — COMPREHENSIVE METABOLIC PANEL
ALT: 19 U/L (ref 0–44)
AST: 16 U/L (ref 15–41)
Albumin: 3.7 g/dL (ref 3.5–5.0)
Alkaline Phosphatase: 37 U/L — ABNORMAL LOW (ref 38–126)
Anion gap: 10 (ref 5–15)
BUN: 22 mg/dL (ref 8–23)
CO2: 21 mmol/L — ABNORMAL LOW (ref 22–32)
Calcium: 8.6 mg/dL — ABNORMAL LOW (ref 8.9–10.3)
Chloride: 106 mmol/L (ref 98–111)
Creatinine, Ser: 0.93 mg/dL (ref 0.44–1.00)
GFR calc Af Amer: >60 mL/min (ref >60)
GFR calc non Af Amer: >60 mL/min (ref >60)
Glucose, Bld: 138 mg/dL — ABNORMAL HIGH (ref 70–99)
Potassium: 3.9 mmol/L (ref 3.5–5.1)
Sodium: 137 mmol/L (ref 135–145)
Total Bilirubin: 0.9 mg/dL (ref 0.3–1.2)
Total Protein: 7.6 g/dL (ref 6.5–8.1)

## 2018-08-12 LAB — CBC
HCT: 46.3 % — ABNORMAL HIGH (ref 36.0–46.0)
Hemoglobin: 15.1 g/dL — ABNORMAL HIGH (ref 12.0–15.0)
MCH: 32.6 pg (ref 26.0–34.0)
MCHC: 32.6 g/dL (ref 30.0–36.0)
MCV: 100 fL (ref 80.0–100.0)
Platelets: 333 10*3/uL (ref 150–400)
RBC: 4.63 MIL/uL (ref 3.87–5.11)
RDW: 13.5 % (ref 11.5–15.5)
WBC: 12.9 10*3/uL — ABNORMAL HIGH (ref 4.0–10.5)
nRBC: 0 % (ref 0.0–0.2)

## 2018-08-12 LAB — LIPASE, BLOOD: Lipase: 24 U/L (ref 11–51)

## 2018-08-12 MED ORDER — LORAZEPAM 2 MG/ML IJ SOLN
1.0000 mg | Freq: Once | INTRAMUSCULAR | Status: DC
  Filled 2018-08-12: qty 1

## 2018-08-12 MED ORDER — HYDROMORPHONE HCL 1 MG/ML IJ SOLN
0.7500 mg | Freq: Once | INTRAMUSCULAR | Status: DC
  Filled 2018-08-12: qty 1

## 2018-08-12 MED ORDER — IOPAMIDOL (ISOVUE-300) INJECTION 61%
100.0000 mL | Freq: Once | INTRAVENOUS | Status: AC | PRN
  Administered 2018-08-12: 100 mL via INTRAVENOUS

## 2018-08-12 MED ORDER — SODIUM CHLORIDE 0.9 % IV BOLUS: 500.0000 mL | Freq: Once | INTRAVENOUS | Status: DC

## 2018-08-12 MED ORDER — LORAZEPAM 2 MG/ML IJ SOLN
1.0000 mg | Freq: Once | INTRAMUSCULAR | Status: AC
  Administered 2018-08-12: 1 mg via INTRAMUSCULAR

## 2018-08-12 MED ORDER — HYDROMORPHONE HCL 1 MG/ML IJ SOLN
0.7500 mg | Freq: Once | INTRAMUSCULAR | Status: AC
  Administered 2018-08-12: 0.75 mg via INTRAMUSCULAR

## 2018-08-12 MED ORDER — SODIUM CHLORIDE (PF) 0.9 % IJ SOLN
INTRAMUSCULAR | Status: AC
  Filled 2018-08-12: qty 50

## 2018-08-12 MED ORDER — IOPAMIDOL (ISOVUE-300) INJECTION 61%
INTRAVENOUS | Status: AC
  Filled 2018-08-12: qty 100

## 2018-08-12 NOTE — Progress Notes (Signed)
[facility] technologist watched contrast as going into the IV. Contrast extravasation 112mL Isovue 300. Dr. Wilson Singer notified to assess patient, no radiologist on site Icepack placed, arm elevated, and instructions given to the RN/Dr. Wilson Singer for the patient care.  Per Dr. Wilson Singer without contrast for CT scan.

## 2018-08-12 NOTE — Discharge Instructions (Signed)
On the way home, pick up a bottle of Miralax and two 32 oz bottles of a sports drink (I suggest Gatorade Frost for it's crisp refreshing taste). Mix four dose of miralax into the sports drink and finish it over the course of 2-3 hours. May repeat is necessary.

## 2018-08-12 NOTE — ED Triage Notes (Signed)
Pt with diarrhea x 1 week, pt states she noticed blood in stool yesterday but that has resolved. Pt with hx of diverticulitis. C/o rectal pain.

## 2018-08-12 NOTE — ED Provider Notes (Signed)
[name] DEPT Provider Note   CSN: [id number] Arrival date & time: 08/12/18  1140     History   Chief Complaint Chief Complaint  Patient presents with  . Diarrhea  . Rectal Bleeding    HPI [name] is a 67 y.o. female.  HPI    63yF with abdominal pain. She is very anxious and I find it difficult guide her in giving a focused history. She reports she often has abdominal pain but it has been worse in the past week.  "It hurts from here to here" as she waves her hand across her entire abdomen. She also says she just finished a course of antibiotics for possible diverticulitis around Christmas which would actually make the onset of symptoms beyond a week ago. Pain is constant. Nausea. No v/d. Reports past history of diverticulitis, small bowel tumor resection, partial colectomy and "abscess on my diaphragm." She is very concerned that she is having a reoccurrence of multiple different problems she has had previously. She has had blood in her stool but she says this is a chronic issue.   Past Medical History:  Diagnosis Date  . Arthritis   . Cervical spinal stenosis   . Complication of anesthesia    "makes me cry for days."  . Diverticulitis   . Family history of anesthesia complication    Sister has nausea and vomitting.  Marland Kitchen Hypercholesteremia   . Hypertension     Patient Active Problem List   Diagnosis Date Noted  . Vitamin D deficiency 01/02/2016  . Arthropathy 01/02/2016  . Tremor 01/02/2016  . Primary generalized (osteo)arthritis 01/02/2016  . Cigarette nicotine dependence, uncomplicated [id number]/83/3825  . Female climacteric state 01/02/2016  . Hyperglycemia 01/02/2016  . Essential (primary) hypertension 01/02/2016  . Elevated white blood cell count 01/02/2016  . 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 Cervicalgia 01/02/2016  . Radiculopathy of cervical region 01/02/2016  . Essential tremor  01/02/2016  . Anxiety disorder 01/02/2016  . Pure hypercholesterolemia 01/02/2016  . Weakness 01/02/2016  . Fatigue 01/02/2016  . HLD (hyperlipidemia) 01/02/2016  . Body mass index (BMI) of 35.0-35.9 in adult 08/28/2014    Past Surgical History:  Procedure Laterality Date  . ABDOMINAL HYSTERECTOMY  1995  . ANTERIOR CERVICAL DECOMP/DISCECTOMY FUSION  12/30/11  . BACK SURGERY  1992   . COLECTOMY  2004   "lost 1/3 of my colon"  . THORACIC FUSION  1992  . TUMOR REMOVAL     small intestine     OB History   No obstetric history on file.      Home Medications    Prior to Admission medications   Medication Sig Start Date End Date Taking? Authorizing Provider  acetaminophen (TYLENOL) 500 MG tablet Take 500 mg by mouth every 6 (six) hours as needed for mild pain, moderate pain, fever or headache.    [provider]  ALPRAZolam Duanne Moron) 0.5 MG tablet Take 0.25 tablets by mouth every 6 (six) hours as needed for anxiety or sleep. anxiety 02/20/15   [provider]  calcium carbonate (OS-CAL - DOSED IN MG OF ELEMENTAL CALCIUM) 1250 (500 Ca) MG tablet Take 1 tablet by mouth 3 (three) times a week.    [provider]  cetirizine (ZYRTEC) 10 MG tablet Take 10 mg by mouth daily.    [provider]  Cholecalciferol (VITAMIN D) 50 MCG (2000 UT) tablet Take 2,000 Units by mouth 3 (three) times a week.  [provider]  ciprofloxacin (CIPRO) 500 MG tablet Take 1 tablet (500 mg total) by mouth 2 (two) times daily. 07/12/18   Lawyer, Harrell Gave, PA-C  ferrous sulfate 325 (65 FE) MG tablet Take 325 mg by mouth 3 (three) times a week.    [provider]  fexofenadine (ALLEGRA) 180 MG tablet Take 180 mg by mouth daily.    [provider]  glucosamine-chondroitin 500-400 MG tablet Take 1 tablet by mouth 3 (three) times a week.    [provider]  HYDROcodone-acetaminophen (NORCO) 5-325 MG tablet Take 1-2 tablets by mouth every 6  (six) hours as needed. Patient taking differently: Take 0.5 tablets by mouth every 6 (six) hours as needed for moderate pain.  08/03/15   Veryl Speak, MD  hydrocortisone (ANUSOL-HC) 25 MG suppository Place 25 mg rectally daily as needed for hemorrhoids or anal itching.    [provider]  ketoconazole (NIZORAL) 2 % shampoo Apply 1 application topically 2 (two) times a week.    [provider]  losartan (COZAAR) 25 MG tablet Take 25 mg by mouth daily.  03/27/18   [provider]  metroNIDAZOLE (FLAGYL) 500 MG tablet Take 1 tablet (500 mg total) by mouth 2 (two) times daily. 07/12/18   Lawyer, Harrell Gave, PA-C  mupirocin ointment (BACTROBAN) 2 % Place 1 application into the nose 2 (two) times daily. Patient taking differently: Place 1 application into the nose daily.  05/18/18   Valentina Shaggy, MD  ondansetron (ZOFRAN ODT) 4 MG disintegrating tablet Take 1 tablet (4 mg total) by mouth every 8 (eight) hours as needed for nausea or vomiting. Patient not taking: Reported on 07/25/2018 07/13/18   Lorayne Bender, PA-C  primidone (MYSOLINE) 50 MG tablet Take 1 tablet (50 mg total) by mouth at bedtime. Patient not taking: Reported on 05/04/2018 12/15/17   Tat, Eustace Quail, DO  traMADol (ULTRAM) 50 MG tablet Take 1 tablet (50 mg total) by mouth every 6 (six) hours as needed for severe pain. Patient not taking: Reported on 08/02/2018 07/12/18   Dalia Heading, PA-C    Family History Family History  Problem Relation Age of Onset  . Anesthesia problems Sister   . Allergic rhinitis Sister   . Food Allergy Sister        shellfish  . Angioedema Neg Hx   . Asthma Neg Hx   . Eczema Neg Hx   . Urticaria Neg Hx     Social History Social History   Tobacco Use  . Smoking status: Current Every Day Smoker    Packs/day: 1.00    Years: 40.00    Pack years: 40.00    Types: Cigarettes  . Smokeless tobacco: Never Used  . Tobacco comment: pt states she has not smoked since  07/26/18  Substance Use Topics  . Alcohol use: No    Alcohol/week: 0.0 standard drinks  . Drug use: No     Allergies   Lyrica [pregabalin]; Simvastatin; Statins; and Meloxicam   Review of Systems Review of Systems  All systems reviewed and negative, other than as noted in HPI.  Physical Exam Updated Vital Signs BP (!) 159/90 (BP Location: Left Arm)   Pulse 74   Temp 98 F (36.7 C) (Oral)   Resp 20   SpO2 98%   Physical Exam Vitals signs and nursing note reviewed.  Constitutional:      Appearance: She is well-developed. She is obese. She is not ill-appearing, toxic-appearing or diaphoretic.  HENT:  Head: Normocephalic and atraumatic.  Eyes:     General:        Right eye: No discharge.        Left eye: No discharge.     Conjunctiva/sclera: Conjunctivae normal.  Neck:     Musculoskeletal: Neck supple.  Cardiovascular:     Rate and Rhythm: Normal rate and regular rhythm.     Heart sounds: Normal heart sounds. No murmur. No friction rub. No gallop.   Pulmonary:     Effort: Pulmonary effort is normal. No respiratory distress.     Breath sounds: Normal breath sounds.  Abdominal:     General: There is no distension.     Palpations: Abdomen is soft.     Tenderness: There is abdominal tenderness.     Comments: Diffuse abdominal tenderness but seems to be worse periumbilically to upper abdomen. No rebound or guarding.   Musculoskeletal:        General: No tenderness.  Skin:    General: Skin is warm and dry.  Neurological:     Mental Status: She is alert.  Psychiatric:     Comments: Anxious. Crying at times. Somewhat tangential.       ED Treatments / Results  Labs (all labs ordered are listed, but only abnormal results are displayed) Labs Reviewed  COMPREHENSIVE METABOLIC PANEL - Abnormal; Notable for the following components:      Result Value   CO2 21 (*)    Glucose, Bld 138 (*)    Calcium 8.6 (*)    Alkaline Phosphatase 37 (*)    All other components  within normal limits  CBC - Abnormal; Notable for the following components:   WBC 12.9 (*)    Hemoglobin 15.1 (*)    HCT 46.3 (*)    All other components within normal limits  LIPASE, BLOOD  URINALYSIS, ROUTINE W REFLEX MICROSCOPIC    EKG None  Radiology No results found.   Ct Abdomen Pelvis Wo Contrast  Result Date: 08/12/2018 CLINICAL DATA:  67 year old with one-week history of generalized abdominal pain (with rectal pain in particular) and one-week history of diarrhea diarrhea with bloody stools yesterday. Personal history of diverticulitis. Surgical history includes hysterectomy, partial colectomy and remote prior small bowel resection. EXAM: CT ABDOMEN AND PELVIS WITHOUT CONTRAST TECHNIQUE: Multidetector CT imaging of the abdomen and pelvis was performed following the standard protocol without IV contrast. The patient was administered 100 mL of Isovue-300 intravenously, though the entire contrast does extravasated into the patient's arm according to the CT technologist. Dr. Wilson Singer from the emergency department was notified by the CT technologist and evaluated the patient with placement of an ice pack. The orders routinely utilized in patients with contrast extravasation were placed in the electronic medical record. COMPARISON:  07/12/2018, 05/17/2016 and earlier. FINDINGS: Lower chest: Heart size normal.  Visualized lung bases clear. Hepatobiliary: Normal unenhanced appearance of the liver. Gallbladder normal in appearance without calcified gallstones. No biliary ductal dilation. Pancreas: Moderate diffuse pancreatic atrophy. No mass or peripancreatic inflammation. Spleen: Normal unenhanced appearance. Adrenals/Urinary Tract: Mild LEFT adrenal hyperplasia, unchanged. Normal appearing RIGHT adrenal gland. Benign cysts arising from the UPPER pole and the LOWER pole of the LEFT kidney, unchanged. Within the limits of the unenhanced technique, no significant focal parenchymal abnormality involving  either kidney. No hydronephrosis. No urinary tract calculi. Normal appearing urinary bladder. Stomach/Bowel: Normal-appearing decompressed stomach. Proximal jejunal small bowel anastomosis appears widely patent. Mild dilation of the small bowel loops at the anastomosis not felt to  be pathologic and is often seen due to atony related to the surgery. Remainder of small bowel normal in appearance without evidence of obstruction. Moderate stool burden throughout the entire colon from cecum to rectum. Diffuse colonic diverticulosis without evidence of acute diverticulitis. Normal appendix in the RIGHT UPPER pelvis. Vascular/Lymphatic: Severe aorto-iliofemoral atherosclerosis without evidence of aneurysm. No pathologic lymphadenopathy. Reproductive: Surgically absent uterus. No adnexal masses. Other: Large umbilical hernia containing a normal-appearing loop of small bowel as noted previously. RIGHT paramedian ANTERIOR abdominal wall hernia in the pelvis containing fat, unchanged. Phleboliths low in both sides of the pelvis. Musculoskeletal: Severe degenerative disc disease at L5-S1. Facet degenerative changes throughout the lumbar spine. LOWER thoracic spondylosis. No acute findings. IMPRESSION: 1. No acute abnormalities involving the abdomen or pelvis. 2. Moderate stool burden throughout the entire colon from cecum to rectum. Diffuse colonic diverticulosis without evidence of acute diverticulitis. 3. Large umbilical hernia containing a normal-appearing loop of small bowel as noted previously. 4. RIGHT paramedian abdominal wall hernia in the pelvis containing fat, unchanged. 5. Aortic Atherosclerosis (ICD10-170.0) Electronically Signed   By: Evangeline Dakin M.D.   On: 08/12/2018 17:24   Dg Chest 2 View  Result Date: 08/07/2018 CLINICAL DATA:  Shortness of breath, cough EXAM: CHEST - 2 VIEW COMPARISON:  08/02/2018 FINDINGS: Heart and mediastinal contours are within normal limits. No focal opacities or effusions. No  acute bony abnormality. IMPRESSION: No active cardiopulmonary disease. Electronically Signed   By: Rolm Baptise M.D.   On: 08/07/2018 11:37   Dg Chest 2 View  Result Date: 08/02/2018 CLINICAL DATA:  Shortness of breath EXAM: CHEST - 2 VIEW COMPARISON:  5/14/8 FINDINGS: The heart size and mediastinal contours are within normal limits. Both lungs are clear. The visualized skeletal structures show degenerative change of the thoracic spine. IMPRESSION: No active cardiopulmonary disease. Electronically Signed   By: Inez Catalina M.D.   On: 08/02/2018 09:39    Procedures Procedures (including critical care time)  Medications Ordered in ED Medications  sodium chloride 0.9 % bolus 500 mL (has no administration in time range)  HYDROmorphone (DILAUDID) injection 0.75 mg (has no administration in time range)  LORazepam (ATIVAN) injection 1 mg (has no administration in time range)     Initial Impression / Assessment and Plan / ED Course  I have reviewed the triage vital signs and the nursing notes.  Pertinent labs & imaging results that were available during my care of the patient were reviewed by me and considered in my medical decision making (see chart for details).     67yF with diffuse abdominal pain. Apparently finished abx for possible diverticulitis a few days ago. She actually seems more tender in upper abdomen on my exam. Mild leukocytosis. Labs otherwise fairly unremarkable. Will tx symptoms. UA pending. CT a/p.   Imaging ok. Reassurance provided. Suspect anxiety component. Bowel regime discussed.   Final Clinical Impressions(s) / ED Diagnoses   Final diagnoses:  Generalized abdominal pain  Constipation, unspecified constipation type    ED Discharge Orders    None       Virgel Manifold, MD 08/18/18 [phone]

## 2018-08-18 ENCOUNTER — Other Ambulatory Visit: Payer: Self-pay | Admitting: Family Medicine

## 2018-08-18 ENCOUNTER — Ambulatory Visit
Admission: RE | Admit: 2018-08-18 | Discharge: 2018-08-18 | Disposition: A | Discharge status: Home / Self Care | Payer: Medicare Other | Source: Ambulatory Visit | Attending: Family Medicine | Admitting: Family Medicine

## 2018-08-18 DIAGNOSIS — R52 Pain, unspecified: Secondary | ICD-10-CM

## 2018-08-18 DIAGNOSIS — M542 Cervicalgia: Secondary | ICD-10-CM | POA: Diagnosis not present

## 2018-09-05 DIAGNOSIS — K625 Hemorrhage of anus and rectum: Secondary | ICD-10-CM | POA: Diagnosis not present

## 2018-09-05 DIAGNOSIS — K5730 Diverticulosis of large intestine without perforation or abscess without bleeding: Secondary | ICD-10-CM | POA: Diagnosis not present

## 2018-09-05 DIAGNOSIS — Z1211 Encounter for screening for malignant neoplasm of colon: Secondary | ICD-10-CM | POA: Diagnosis not present

## 2018-09-06 ENCOUNTER — Encounter: Payer: Self-pay | Admitting: Neurology

## 2018-09-06 DIAGNOSIS — M47812 Spondylosis without myelopathy or radiculopathy, cervical region: Secondary | ICD-10-CM | POA: Diagnosis not present

## 2018-09-06 DIAGNOSIS — M542 Cervicalgia: Secondary | ICD-10-CM | POA: Diagnosis not present

## 2018-09-06 DIAGNOSIS — R251 Tremor, unspecified: Secondary | ICD-10-CM | POA: Diagnosis not present

## 2018-09-06 DIAGNOSIS — M5022 Other cervical disc displacement, mid-cervical region, unspecified level: Secondary | ICD-10-CM | POA: Diagnosis not present

## 2018-09-12 DIAGNOSIS — M47812 Spondylosis without myelopathy or radiculopathy, cervical region: Secondary | ICD-10-CM | POA: Diagnosis not present

## 2018-09-13 DIAGNOSIS — F419 Anxiety disorder, unspecified: Secondary | ICD-10-CM | POA: Diagnosis not present

## 2018-09-13 DIAGNOSIS — G250 Essential tremor: Secondary | ICD-10-CM | POA: Diagnosis not present

## 2018-09-13 DIAGNOSIS — R5382 Chronic fatigue, unspecified: Secondary | ICD-10-CM | POA: Diagnosis not present

## 2018-09-13 DIAGNOSIS — M5412 Radiculopathy, cervical region: Secondary | ICD-10-CM | POA: Diagnosis not present

## 2018-09-15 NOTE — Progress Notes (Signed)
Subjective:   [name] was seen in consultation in the movement disorder clinic at the request of Dr. Vertell Limber.  Her PCP is Kelton Pillar, MD.  The evaluation is for tremor.    The patient is a 68 y.o. right handed female with a history of tremor.  Tremor is in "arms and hands."  She states that she has had L arm tremor for 5-10 years; she notices it when using the hand.  She states that she was thinking that it was due to a pinched nerve.  She saw Dr. Erling Cruz for it and was told that it was essential tremor.  She states that she had a cervical fusion 3 years ago and when "I woke up I couldn't move the right arm."  She states that tremor in the right arm started after that.  She was tried on lyrica but states that it made tremor on the L worse.  She states that gabapentin had a similar result.  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  There is no family hx of tremor.    Affected by caffeine: unknown, as doesn't drink much Affected by alcohol:  Unknown as doesn't drink Affected by stress:  No. Affected by fatigue:  No. Spills soup if on spoon:  Yes.   Spills glass of liquid if full:  Yes.   Affects ADL's (tying shoes, brushing teeth, etc): some (notices it when pulls up pants)  02/05/15 update:  The patient returns today for follow-up.  I started her on primidone last visit.  The patient reports this is definitely helping her tremor.  She denies any side effects with the medication.  I got labs from her PCP since last visit.  They were from 06/2014 but states that she had labs few weeks ago.  She had normal HgbA1C of 5.8.  Her chemistry was normal as was ast/alt.  CBC was normal.  09/10/15 update:  The patient is following up today regarding essential tremor.  She is on primidone, 50 daily.  "It has cooled tremor down."  States that the primidone worked on the L hand  but not on the right, because she is convinced that the right is from nerve damage and "that medication doesn't help nerve damage."  I checked her TSH last visit and it was normal at 1.65.  10/24/15 update:  The patient is following up today regarding essential tremor.  She is on primidone, 50 daily.  I checked her TSH last visit and it was normal at 1.65.  She was not going to f/u here any longer but requested visit today as she states that she has "figured out" the source of her tremor and that was a "faulty furnace."  States that she had her furnace replaced a year and half ago and then a repair man came back this year to service the furnace and the repair guy suggested that she has been exposed to [place] for years.  States that this has been the source of her sickness for years.   When I asked her why the sx's didn't get better when she got her new furnace, she states that she thinks that because the sx's just last for a long time.  She has not been to her PCP to discuss this.  She admits that she is very distraught over  this and thinks that the medical community "laughs at me" when she goes to the doctor.  12/15/17 update:  Pt seen in f/u but I haven't seen her in over 2 years.  MRI completed after our last visit in 2017 and I reviewed this.  there was evidence of mod to severe white matter disease, likely due to HTN/hyperlipidemia/tobacco.  There were no basal ganglia lesions.  She was concerned about CO poisoning last visit causing tremor but I told her that this would not be.  She was on primidone and reported that it helped last visit but it is now on the allergy list as it "made symptoms worse."  She doesn't remember primidone but wonders if the mold infestation in her house was messing her up.   Reports that "tremor is driving me crazy."  Not drinking caffeine.  She thinks that stress increases tremor "a little" as does fatigue.  States that she cannot hold a glass in her hands.  She was in the ER last year  multiple times with various c/o and reported that mold and "winged cockroaches" in the home were a concern as to being etiology for medical issues.  Reports today that mold under her house caused many of her issues.  She also thought that she "may have the plague."  Ultimately, it was felt depression was an issue.  09/19/18 update: Patient seen today in follow-up for tremor, at the urging of Dr. Vertell Limber.  Last visit, the patient insisted that tremor was from a pinched nerve in her neck, but I explained that that was not a physiologic process that would explain tremor.  She saw Dr. Vertell Limber.  She  had a cervical facet block at C2-C3 with Dr. Maryjean Ka and "it helped me immensely" and "it very much helped the tremor."  She has another one at the end of the month.  She states that it helped the tremor a lot - perhaps 60%.    We restarted primidone last visit at 50 mg daily.  She is no longer on that.  She states that it made tremor worse but then states that "maybe it just didn't help."  "I think that maybe the majority of my problem is arthritis is in my neck."  Counseling was suggested last visit for the tremor, as it was felt that there was a functional component to tremor.  She did not proceed with that.  Medical records have been reviewed since our last visit.  She has been in the emergency room several times with complaints of abdominal pain, the feeling that she has a crawling sensation in the back/cyst in the back (nothing noted on physical exam), worried about mold exposure.  She has been seen by allergist worried about mold exposure, but the allergist did not think her "intense clinical picture" were related to mold exposure.  In fact, the last time she went in for this, the 8 assistant talk to her about anxiety and depression.  She was insistent that she did not have any.  She went back to the emergency room 2 days later complaining "I am poisoned and dying and no one can help me."  Emergency room  physician felt that the patient could benefit from speaking with psychiatry, and while she did see a counselor during that ER visit, she did not see psychiatry.  Prior medications tried: Primidone  Outside reports reviewed: historical medical records.  Allergies  Allergen Reactions  . Lyrica [Pregabalin] Other (See Comments)    Made  symptoms worse  . Simvastatin Other (See Comments)    Myalgias  . Statins Other (See Comments)    Myalgias  . Meloxicam Other (See Comments)    Makes her sweat and muscle cramps     Outpatient Encounter Medications as of 09/19/2018  Medication Sig  . acetaminophen (TYLENOL) 500 MG tablet Take 500 mg by mouth every 6 (six) hours as needed for mild pain, moderate pain, fever or headache.  . ALPRAZolam (XANAX) 0.5 MG tablet Take 0.25 tablets by mouth every 6 (six) hours as needed for anxiety or sleep. anxiety  . calcium carbonate (OS-CAL - DOSED IN MG OF ELEMENTAL CALCIUM) 1250 (500 Ca) MG tablet Take 1 tablet by mouth 3 (three) times a week.  . Cholecalciferol (VITAMIN D) 50 MCG (2000 UT) tablet Take 2,000 Units by mouth 3 (three) times a week.   Marland Kitchen glucosamine-chondroitin 500-400 MG tablet Take 1 tablet by mouth 3 (three) times a week.  Marland Kitchen HYDROcodone-acetaminophen (NORCO) 5-325 MG tablet Take 1-2 tablets by mouth every 6 (six) hours as needed. (Patient taking differently: Take 0.5 tablets by mouth every 6 (six) hours as needed for moderate pain. )  . hydrocortisone (ANUSOL-HC) 25 MG suppository Place 25 mg rectally daily as needed for hemorrhoids or anal itching.  Marland Kitchen ketoconazole (NIZORAL) 2 % shampoo Apply 1 application topically 2 (two) times a week.  . losartan (COZAAR) 25 MG tablet Take 25 mg by mouth daily.   . Multiple Vitamins-Iron (MULTI-VITAMIN/IRON PO) Take by mouth daily.  . [DISCONTINUED] cetirizine (ZYRTEC) 10 MG tablet Take 10 mg by mouth daily.  . [DISCONTINUED] ciprofloxacin (CIPRO) 500 MG tablet Take 1 tablet (500 mg total) by mouth 2 (two)  times daily.  . [DISCONTINUED] ferrous sulfate 325 (65 FE) MG tablet Take 325 mg by mouth 3 (three) times a week.  . [DISCONTINUED] fexofenadine (ALLEGRA) 180 MG tablet Take 180 mg by mouth daily.  . [DISCONTINUED] metroNIDAZOLE (FLAGYL) 500 MG tablet Take 1 tablet (500 mg total) by mouth 2 (two) times daily.  . [DISCONTINUED] mupirocin ointment (BACTROBAN) 2 % Place 1 application into the nose 2 (two) times daily. (Patient taking differently: Place 1 application into the nose daily. )  . [DISCONTINUED] ondansetron (ZOFRAN ODT) 4 MG disintegrating tablet Take 1 tablet (4 mg total) by mouth every 8 (eight) hours as needed for nausea or vomiting. (Patient not taking: Reported on 07/25/2018)  . [DISCONTINUED] primidone (MYSOLINE) 50 MG tablet Take 1 tablet (50 mg total) by mouth at bedtime. (Patient not taking: Reported on 05/04/2018)  . [DISCONTINUED] traMADol (ULTRAM) 50 MG tablet Take 1 tablet (50 mg total) by mouth every 6 (six) hours as needed for severe pain. (Patient not taking: Reported on 08/02/2018)   No facility-administered encounter medications on file as of 09/19/2018.     Past Medical History:  Diagnosis Date  . Arthritis   . Cervical spinal stenosis   . Complication of anesthesia    "makes me cry for days."  . Diverticulitis   . Family history of anesthesia complication    Sister has nausea and vomitting.  Marland Kitchen Hypercholesteremia   . Hypertension     Past Surgical History:  Procedure Laterality Date  . ABDOMINAL HYSTERECTOMY  1995  . ANTERIOR CERVICAL DECOMP/DISCECTOMY FUSION  12/30/11  . BACK SURGERY  1992   . COLECTOMY  2004   "lost 1/3 of my colon"  . THORACIC FUSION  1992  . TUMOR REMOVAL     small intestine    Social History  Socioeconomic History  . Marital status: Divorced    Spouse name: Not on file  . Number of children: Not on file  . Years of education: Not on file  . Highest education level: Not on file  Occupational History  . Occupation: retired     Comment: Press photographer, computer  Social Needs  . Financial resource strain: Not on file  . Food insecurity:    Worry: Not on file    Inability: Not on file  . Transportation needs:    Medical: Not on file    Non-medical: Not on file  Tobacco Use  . Smoking status: Current Every Day Smoker    Packs/day: 1.00    Years: 40.00    Pack years: 40.00    Types: Cigarettes  . Smokeless tobacco: Never Used  . Tobacco comment: pt states she has not smoked since 07/26/18  Substance and Sexual Activity  . Alcohol use: No    Alcohol/week: 0.0 standard drinks  . Drug use: No  . Sexual activity: Never  Lifestyle  . Physical activity:    Days per week: Not on file    Minutes per session: Not on file  . Stress: Not on file  Relationships  . Social connections:    Talks on phone: Not on file    Gets together: Not on file    Attends religious service: Not on file    Active member of club or organization: Not on file    Attends meetings of clubs or organizations: Not on file    Relationship status: Not on file  . Intimate partner violence:    Fear of current or ex partner: Not on file    Emotionally abused: Not on file    Physically abused: Not on file    Forced sexual activity: Not on file  Other Topics Concern  . Not on file  Social History Narrative  . Not on file    Family Status  Relation Name Status  . Mother  Deceased       healthy   . Father  Deceased       healthy  . Sister  Alive       healthy  . Son  Alive       healthy  . Neg Hx  (Not Specified)    Review of Systems Review of Systems  Constitutional: Negative.   HENT: Negative.   Eyes: Negative.   Respiratory: Negative.   Cardiovascular: Negative.   Gastrointestinal: Negative.   Genitourinary: Negative.   Musculoskeletal: Positive for neck pain (improving).  Skin: Negative.       Objective:   VITALS:   Vitals:   09/19/18 0843  BP: 104/66  Pulse: 66  SpO2: 95%  Weight: 204 lb (92.5 kg)  Height: 5\' 5"   (1.651 m)   GEN:  The patient appears stated age and is in NAD. HEENT:  Normocephalic, atraumatic.  The mucous membranes are moist. The superficial temporal arteries are without ropiness or tenderness. CV:  RRR Lungs:  CTAB Neck/HEME:  There are no carotid bruits bilaterally.  Neurological examination:  Orientation: The patient is alert and oriented x3. Cranial nerves: There is good facial symmetry. The speech is fluent and clear. Soft palate rises symmetrically and there is no tongue deviation. Hearing is intact to conversational tone. Sensation: Sensation is intact to light touch throughout Motor: Strength is 5/5 in the bilateral upper and lower extremities.   Shoulder shrug is equal and symmetric.  There is no  pronator drift.  Movement examination: Tone: There is normal tone in the upper and lower extremities. Abnormal movements: There is no rest tremor.  When holding out the arm and asked to tap a rhythm with the opposite hand, the tremor matches the frequency of the rhythm.  It is a very different frequency than when she is not tapping the rhythm.  When she has not tapping the rhythm, the tremor is very high amplitude.  When tapping the rhythm, it is low amplitude and nearly gone.  Tremor is distractible.  She does have trouble with Archimedes spirals.  We compared to Archimedes spirals from 2016, 2017, 2019 and today and each of these had different characteristics. Coordination:  There is no decremation with RAM's bilaterally Gait and Station: The patient ambulates well in the hall.   LABS    Chemistry      Component Value Date/Time   NA 137 08/12/2018 1204   K 3.9 08/12/2018 1204   CL 106 08/12/2018 1204   CO2 21 (L) 08/12/2018 1204   BUN 22 08/12/2018 1204   CREATININE 0.93 08/12/2018 1204      Component Value Date/Time   CALCIUM 8.6 (L) 08/12/2018 1204   ALKPHOS 37 (L) 08/12/2018 1204   AST 16 08/12/2018 1204   ALT 19 08/12/2018 1204   BILITOT 0.9 08/12/2018 1204           Assessment/Plan:   1.   Tremor.  -Patient may have a component of essential tremor, the tremor that I see today has a large psychogenic component, as has been the tremor I have seen many other visits.    I think that anxiety is playing a primary role.   She clearly has other things going on as well.  She has been into the emergency room many times with believing she has been poisoned with mold and has already seen allergy/immunology.  Psychiatry has been recommended several times.  She states today that she does had a cervical facet block for neck pain and reports that helped the tremor 60% (nonphysiologic).  She has another injection upcoming and would like to hold on any treatment for tremor until she has the next injection.  I did tell her that there is evidence that physical/occupational therapy can help this type of tremor, but she wants to hold on that for now.  She may consider that in the future.  She does state that she feels that she is on a "healing path" as she feels much better than she did previously and is hoping to get back to exercise and thinks that will help her, which it certainly may.  -We did discuss her primidone.  She did not find it helpful in the past.  She was on low dose.  We did talk about higher dosages, but again she decided that she did not want anything right now as she thinks that the cervical facet blocks have helped. 2.  Follow-up as needed.  Greater than 50% of her 25-minute visit was spent in counseling.

## 2018-09-19 ENCOUNTER — Ambulatory Visit: Payer: Medicare Other | Admitting: Neurology

## 2018-09-19 ENCOUNTER — Encounter: Payer: Self-pay | Admitting: Neurology

## 2018-09-19 VITALS — BP 104/66 | HR 66 | Ht 65.0 in | Wt 204.0 lb

## 2018-09-19 DIAGNOSIS — M542 Cervicalgia: Secondary | ICD-10-CM

## 2018-09-19 DIAGNOSIS — R251 Tremor, unspecified: Secondary | ICD-10-CM | POA: Diagnosis not present

## 2018-10-09 DIAGNOSIS — M47812 Spondylosis without myelopathy or radiculopathy, cervical region: Secondary | ICD-10-CM | POA: Diagnosis not present

## 2018-10-13 DIAGNOSIS — J069 Acute upper respiratory infection, unspecified: Secondary | ICD-10-CM | POA: Diagnosis not present

## 2018-10-13 DIAGNOSIS — R05 Cough: Secondary | ICD-10-CM | POA: Diagnosis not present

## 2018-11-01 DIAGNOSIS — M47812 Spondylosis without myelopathy or radiculopathy, cervical region: Secondary | ICD-10-CM | POA: Diagnosis not present

## 2018-11-01 DIAGNOSIS — Z6834 Body mass index (BMI) 34.0-34.9, adult: Secondary | ICD-10-CM | POA: Diagnosis not present

## 2018-11-01 DIAGNOSIS — I10 Essential (primary) hypertension: Secondary | ICD-10-CM | POA: Diagnosis not present

## 2018-12-07 DIAGNOSIS — H9193 Unspecified hearing loss, bilateral: Secondary | ICD-10-CM | POA: Diagnosis not present

## 2018-12-07 DIAGNOSIS — H6123 Impacted cerumen, bilateral: Secondary | ICD-10-CM | POA: Diagnosis not present

## 2019-01-22 DIAGNOSIS — I10 Essential (primary) hypertension: Secondary | ICD-10-CM | POA: Diagnosis not present

## 2019-01-22 DIAGNOSIS — E1169 Type 2 diabetes mellitus with other specified complication: Secondary | ICD-10-CM | POA: Diagnosis not present

## 2019-01-22 DIAGNOSIS — F329 Major depressive disorder, single episode, unspecified: Secondary | ICD-10-CM | POA: Diagnosis not present

## 2019-01-22 DIAGNOSIS — E7800 Pure hypercholesterolemia, unspecified: Secondary | ICD-10-CM | POA: Diagnosis not present

## 2019-02-08 DIAGNOSIS — M47812 Spondylosis without myelopathy or radiculopathy, cervical region: Secondary | ICD-10-CM | POA: Diagnosis not present

## 2019-02-19 DIAGNOSIS — E1169 Type 2 diabetes mellitus with other specified complication: Secondary | ICD-10-CM | POA: Diagnosis not present

## 2019-02-26 DIAGNOSIS — L720 Epidermal cyst: Secondary | ICD-10-CM | POA: Diagnosis not present

## 2019-03-13 DIAGNOSIS — I10 Essential (primary) hypertension: Secondary | ICD-10-CM | POA: Diagnosis not present

## 2019-03-13 DIAGNOSIS — M47812 Spondylosis without myelopathy or radiculopathy, cervical region: Secondary | ICD-10-CM | POA: Diagnosis not present

## 2019-03-13 DIAGNOSIS — Z6835 Body mass index (BMI) 35.0-35.9, adult: Secondary | ICD-10-CM | POA: Diagnosis not present

## 2019-05-06 IMAGING — CT CT ABD-PELV W/O CM
2 of 4 series · 15 of 46 positions shown, 17 images · IV contrast (ISOVUE)
Comparison: 07/12/2018, 05/17/2016 and earlier.

CLINICAL DATA: 67-year-old with one-week history of generalized
abdominal pain (with rectal pain in particular) and one-week history
of diarrhea diarrhea with bloody stools yesterday. Personal history
of diverticulitis. Surgical history includes hysterectomy, partial
colectomy and remote prior small bowel resection.

EXAM:
CT ABDOMEN AND PELVIS WITHOUT CONTRAST
TECHNIQUE: Multidetector CT imaging of the abdomen and pelvis was performed
following the standard protocol without IV contrast.

[Series 2: axial st · axial · 0.96mm/px · z∈[+708,+1108]mm · 12 of 90 slices shown, 14 images]
[im 5/90  soft-tissue]
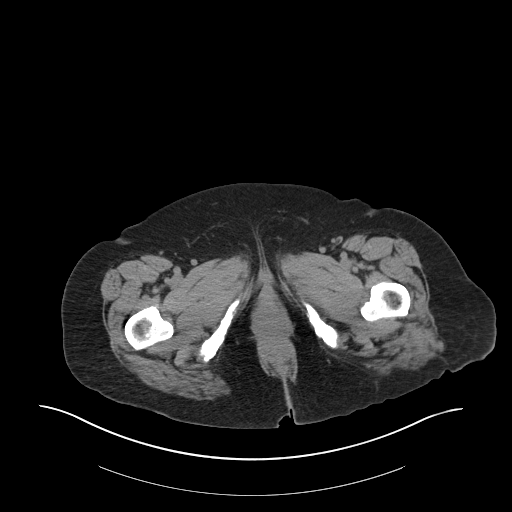
[im 5/90  bone]
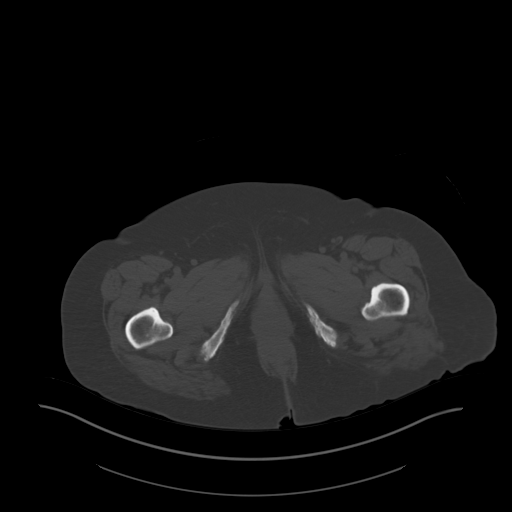
[im 15/90  soft-tissue]
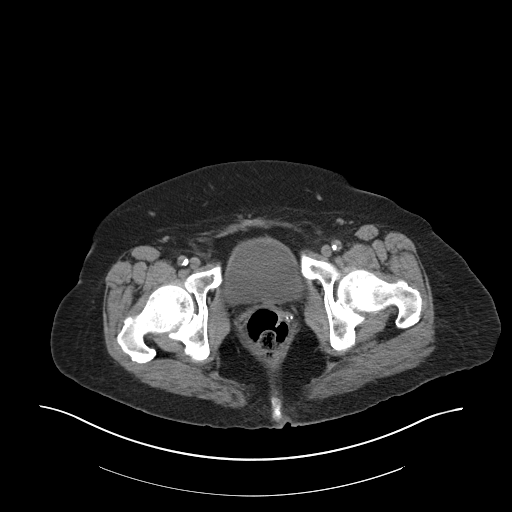
[im 20/90  soft-tissue]
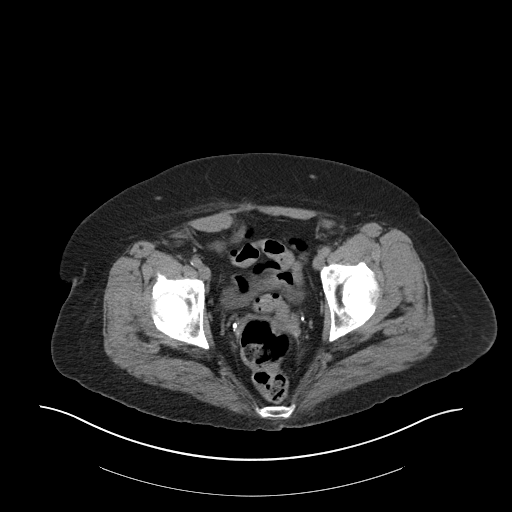
[im 25/90  soft-tissue]
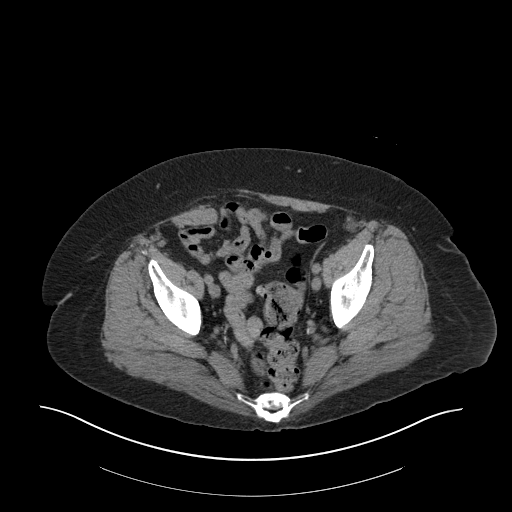
[im 35/90  soft-tissue]
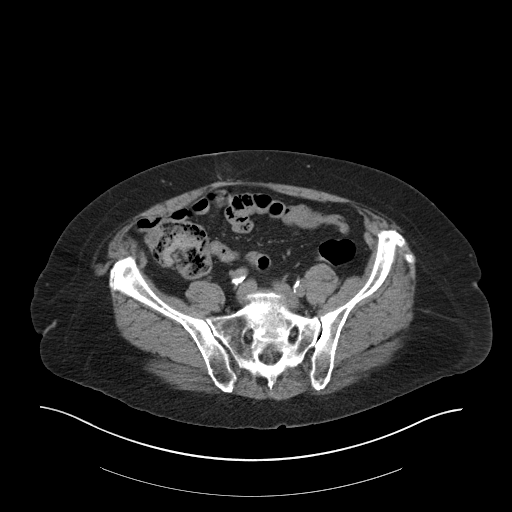
[im 40/90  soft-tissue]
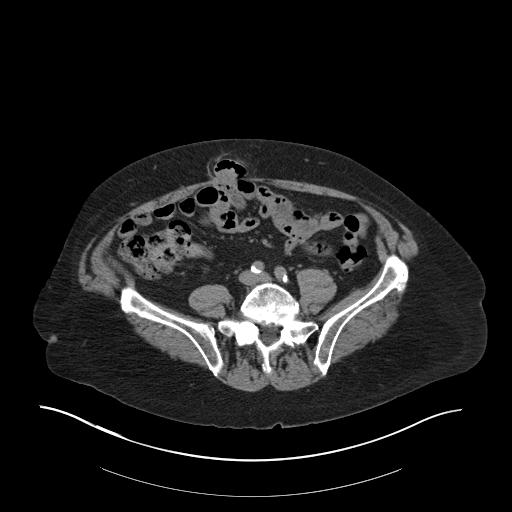
[im 50/90  soft-tissue]
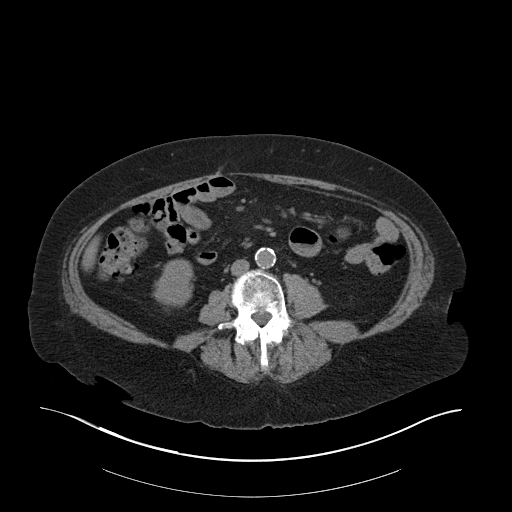
[im 55/90  soft-tissue]
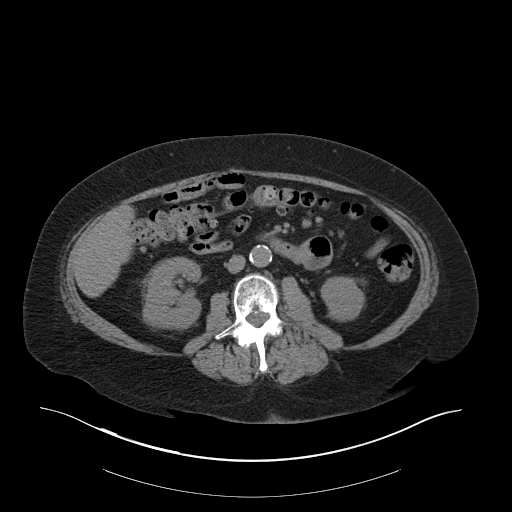
[im 65/90  soft-tissue]
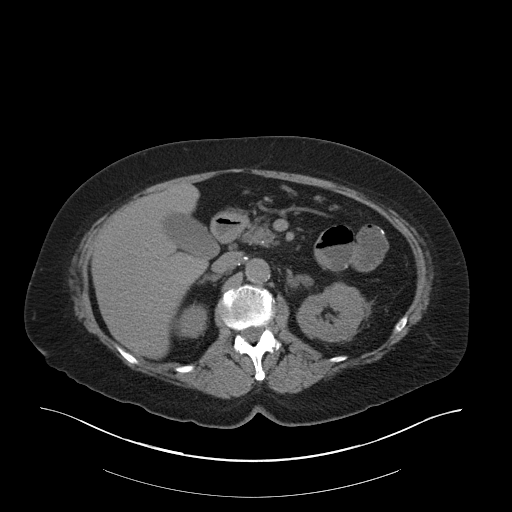
[im 65/90  bone]
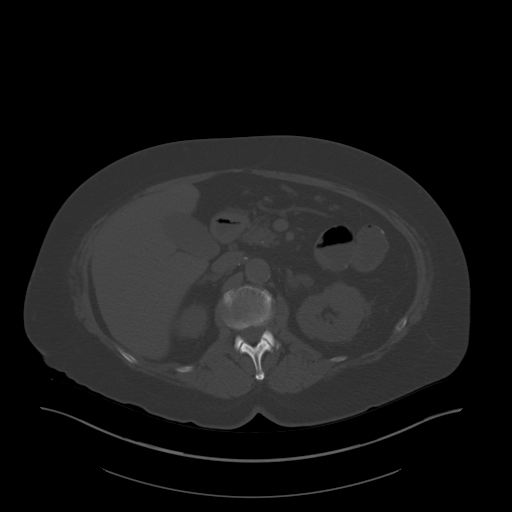
[im 70/90  soft-tissue]
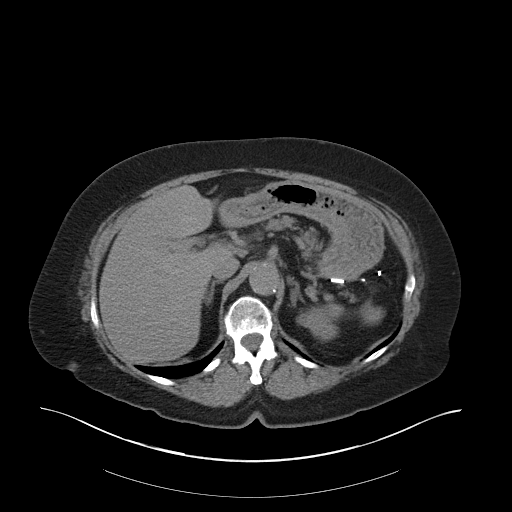
[im 75/90  soft-tissue]
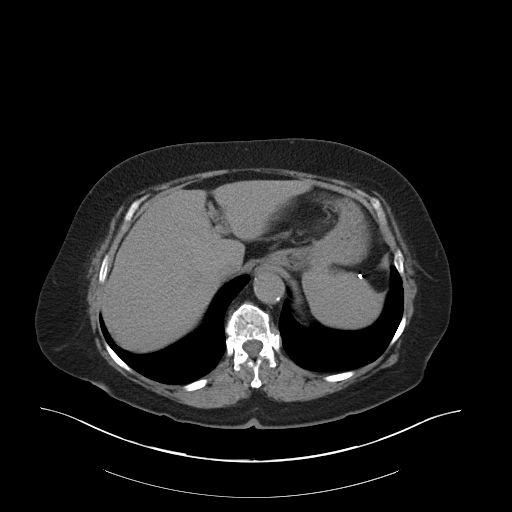
[im 85/90  soft-tissue]
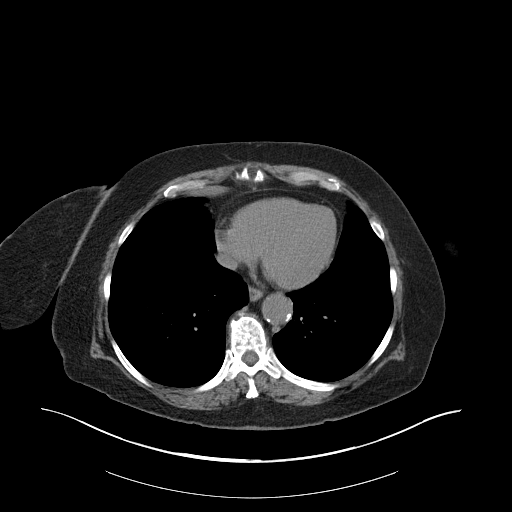

[Series 4: coronal st · coronal · 0.85mm/px · 3 of 104 slices shown]
[im 35/104  soft-tissue]
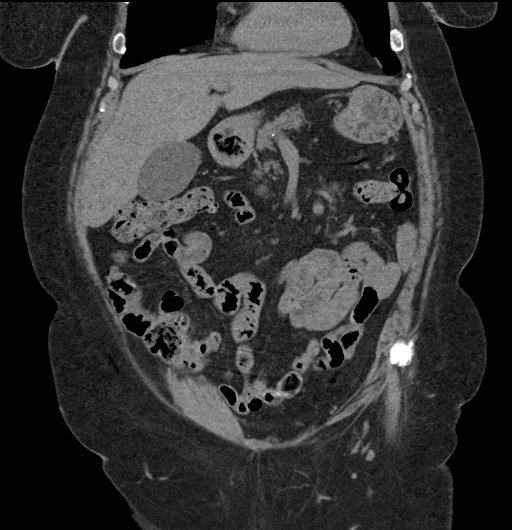
[im 46/104  soft-tissue]
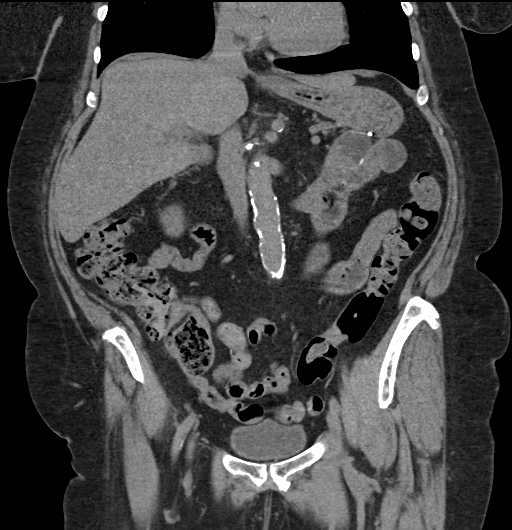
[im 58/104  soft-tissue]
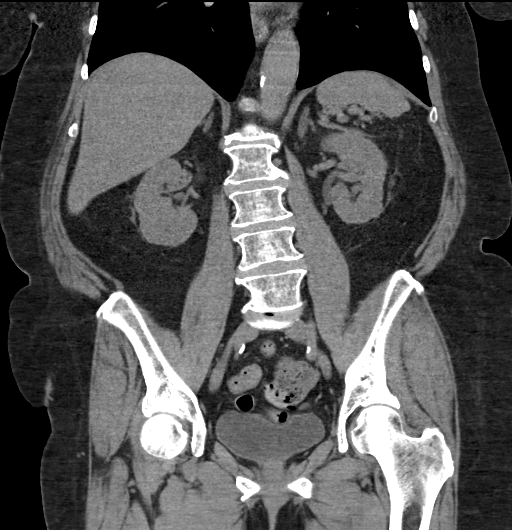

[15 of 46 positions shown; findings below may reference images not displayed]

The patient was administered 100 mL of Ssovue-P99 intravenously,
though the entire contrast does extravasated into the patient's arm
according to the CT technologist. Dr. Hieke from the emergency
department was notified by the CT technologist and evaluated the
patient with placement of an ice pack. The orders routinely utilized
in patients with contrast extravasation were placed in the
electronic medical record.
FINDINGS: Lower chest: Heart size normal.  Visualized lung bases clear.

Hepatobiliary: Normal unenhanced appearance of the liver.
Gallbladder normal in appearance without calcified gallstones. No
biliary ductal dilation.

Pancreas: Moderate diffuse pancreatic atrophy. No mass or
peripancreatic inflammation.

Spleen: Normal unenhanced appearance.

Adrenals/Urinary Tract: Mild LEFT adrenal hyperplasia, unchanged.
Normal appearing RIGHT adrenal gland. Benign cysts arising from the
UPPER pole and the LOWER pole of the LEFT kidney, unchanged. Within
the limits of the unenhanced technique, no significant focal
parenchymal abnormality involving either kidney. No hydronephrosis.
No urinary tract calculi. Normal appearing urinary bladder.

Stomach/Bowel: Normal-appearing decompressed stomach. Proximal
jejunal small bowel anastomosis appears widely patent. Mild dilation
of the small bowel loops at the anastomosis not felt to be
pathologic and is often seen due to atony related to the surgery.
Remainder of small bowel normal in appearance without evidence of
obstruction. Moderate stool burden throughout the entire colon from
cecum to rectum. Diffuse colonic diverticulosis without evidence of
acute diverticulitis. Normal appendix in the RIGHT UPPER pelvis.

Vascular/Lymphatic: Severe aorto-iliofemoral atherosclerosis without
evidence of aneurysm. No pathologic lymphadenopathy.

Reproductive: Surgically absent uterus. No adnexal masses.

Other: Large umbilical hernia containing a normal-appearing loop of
small bowel as noted previously. RIGHT paramedian ANTERIOR abdominal
wall hernia in the pelvis containing fat, unchanged. Phleboliths low
in both sides of the pelvis.

Musculoskeletal: Severe degenerative disc disease at L5-S1. Facet
degenerative changes throughout the lumbar spine. LOWER thoracic
spondylosis. No acute findings.
IMPRESSION: 1. No acute abnormalities involving the abdomen or pelvis.
2. Moderate stool burden throughout the entire colon from cecum to
rectum. Diffuse colonic diverticulosis without evidence of acute
diverticulitis.
3. Large umbilical hernia containing a normal-appearing loop of
small bowel as noted previously.
4. RIGHT paramedian abdominal wall hernia in the pelvis containing
fat, unchanged.
5. Aortic Atherosclerosis ([id number]-170.0)

## 2019-05-15 DIAGNOSIS — M47812 Spondylosis without myelopathy or radiculopathy, cervical region: Secondary | ICD-10-CM | POA: Diagnosis not present

## 2019-05-22 DIAGNOSIS — K5733 Diverticulitis of large intestine without perforation or abscess with bleeding: Secondary | ICD-10-CM | POA: Diagnosis not present

## 2019-05-22 DIAGNOSIS — R1032 Left lower quadrant pain: Secondary | ICD-10-CM | POA: Diagnosis not present

## 2019-05-22 DIAGNOSIS — E669 Obesity, unspecified: Secondary | ICD-10-CM | POA: Diagnosis not present

## 2019-05-22 DIAGNOSIS — L218 Other seborrheic dermatitis: Secondary | ICD-10-CM | POA: Diagnosis not present

## 2019-05-22 DIAGNOSIS — L821 Other seborrheic keratosis: Secondary | ICD-10-CM | POA: Diagnosis not present

## 2019-06-05 DIAGNOSIS — M47812 Spondylosis without myelopathy or radiculopathy, cervical region: Secondary | ICD-10-CM | POA: Diagnosis not present

## 2019-06-11 DIAGNOSIS — I10 Essential (primary) hypertension: Secondary | ICD-10-CM | POA: Diagnosis not present

## 2019-06-11 DIAGNOSIS — E7800 Pure hypercholesterolemia, unspecified: Secondary | ICD-10-CM | POA: Diagnosis not present

## 2019-06-11 DIAGNOSIS — E1169 Type 2 diabetes mellitus with other specified complication: Secondary | ICD-10-CM | POA: Diagnosis not present

## 2019-06-11 DIAGNOSIS — Z0000 Encounter for general adult medical examination without abnormal findings: Secondary | ICD-10-CM | POA: Diagnosis not present

## 2019-07-18 ENCOUNTER — Other Ambulatory Visit: Payer: Self-pay | Admitting: Family Medicine

## 2019-07-18 DIAGNOSIS — Z1231 Encounter for screening mammogram for malignant neoplasm of breast: Secondary | ICD-10-CM

## 2019-07-30 DIAGNOSIS — M47812 Spondylosis without myelopathy or radiculopathy, cervical region: Secondary | ICD-10-CM | POA: Diagnosis not present

## 2019-08-07 DIAGNOSIS — H9201 Otalgia, right ear: Secondary | ICD-10-CM | POA: Diagnosis not present

## 2019-08-28 DIAGNOSIS — M47812 Spondylosis without myelopathy or radiculopathy, cervical region: Secondary | ICD-10-CM | POA: Diagnosis not present

## 2019-08-28 DIAGNOSIS — I10 Essential (primary) hypertension: Secondary | ICD-10-CM | POA: Diagnosis not present

## 2019-08-28 DIAGNOSIS — Z6834 Body mass index (BMI) 34.0-34.9, adult: Secondary | ICD-10-CM | POA: Diagnosis not present

## 2019-09-11 DIAGNOSIS — Z0000 Encounter for general adult medical examination without abnormal findings: Secondary | ICD-10-CM | POA: Diagnosis not present

## 2019-09-11 DIAGNOSIS — E7800 Pure hypercholesterolemia, unspecified: Secondary | ICD-10-CM | POA: Diagnosis not present

## 2019-09-11 DIAGNOSIS — D72829 Elevated white blood cell count, unspecified: Secondary | ICD-10-CM | POA: Diagnosis not present

## 2019-09-11 DIAGNOSIS — E1169 Type 2 diabetes mellitus with other specified complication: Secondary | ICD-10-CM | POA: Diagnosis not present

## 2019-10-16 DIAGNOSIS — L821 Other seborrheic keratosis: Secondary | ICD-10-CM | POA: Diagnosis not present

## 2019-10-16 DIAGNOSIS — L218 Other seborrheic dermatitis: Secondary | ICD-10-CM | POA: Diagnosis not present

## 2019-10-16 DIAGNOSIS — D692 Other nonthrombocytopenic purpura: Secondary | ICD-10-CM | POA: Diagnosis not present

## 2019-10-29 DIAGNOSIS — M47812 Spondylosis without myelopathy or radiculopathy, cervical region: Secondary | ICD-10-CM | POA: Diagnosis not present

## 2019-11-21 DIAGNOSIS — M47812 Spondylosis without myelopathy or radiculopathy, cervical region: Secondary | ICD-10-CM | POA: Diagnosis not present

## 2019-11-22 DIAGNOSIS — K5730 Diverticulosis of large intestine without perforation or abscess without bleeding: Secondary | ICD-10-CM | POA: Diagnosis not present

## 2019-11-22 DIAGNOSIS — K625 Hemorrhage of anus and rectum: Secondary | ICD-10-CM | POA: Diagnosis not present

## 2019-11-22 DIAGNOSIS — K642 Third degree hemorrhoids: Secondary | ICD-10-CM | POA: Diagnosis not present

## 2019-11-22 DIAGNOSIS — E669 Obesity, unspecified: Secondary | ICD-10-CM | POA: Diagnosis not present

## 2019-12-03 ENCOUNTER — Other Ambulatory Visit: Payer: Self-pay

## 2019-12-03 ENCOUNTER — Encounter (HOSPITAL_COMMUNITY): Payer: Self-pay

## 2019-12-03 ENCOUNTER — Emergency Department (HOSPITAL_COMMUNITY): Payer: Medicare Other

## 2019-12-03 ENCOUNTER — Emergency Department (HOSPITAL_COMMUNITY)
Admission: EM | Admit: 2019-12-03 | Discharge: 2019-12-03 | Disposition: A | Discharge status: Home / Self Care | Payer: Medicare Other | Attending: Emergency Medicine | Admitting: Emergency Medicine

## 2019-12-03 DIAGNOSIS — F17210 Nicotine dependence, cigarettes, uncomplicated: Secondary | ICD-10-CM | POA: Diagnosis not present

## 2019-12-03 DIAGNOSIS — Z79899 Other long term (current) drug therapy: Secondary | ICD-10-CM | POA: Diagnosis not present

## 2019-12-03 DIAGNOSIS — I10 Essential (primary) hypertension: Secondary | ICD-10-CM | POA: Insufficient documentation

## 2019-12-03 DIAGNOSIS — R1084 Generalized abdominal pain: Secondary | ICD-10-CM | POA: Insufficient documentation

## 2019-12-03 DIAGNOSIS — K5730 Diverticulosis of large intestine without perforation or abscess without bleeding: Secondary | ICD-10-CM | POA: Diagnosis not present

## 2019-12-03 DIAGNOSIS — R531 Weakness: Secondary | ICD-10-CM | POA: Diagnosis not present

## 2019-12-03 LAB — COMPREHENSIVE METABOLIC PANEL
ALT: 22 U/L (ref 0–44)
AST: 24 U/L (ref 15–41)
Albumin: 3.9 g/dL (ref 3.5–5.0)
Alkaline Phosphatase: 52 U/L (ref 38–126)
Anion gap: 10 (ref 5–15)
BUN: 16 mg/dL (ref 8–23)
CO2: 24 mmol/L (ref 22–32)
Calcium: 9.3 mg/dL (ref 8.9–10.3)
Chloride: 105 mmol/L (ref 98–111)
Creatinine, Ser: 0.87 mg/dL (ref 0.44–1.00)
GFR calc Af Amer: >60 mL/min (ref >60)
GFR calc non Af Amer: >60 mL/min (ref >60)
Glucose, Bld: 96 mg/dL (ref 70–99)
Potassium: 4.3 mmol/L (ref 3.5–5.1)
Sodium: 139 mmol/L (ref 135–145)
Total Bilirubin: 0.9 mg/dL (ref 0.3–1.2)
Total Protein: 9 g/dL — ABNORMAL HIGH (ref 6.5–8.1)

## 2019-12-03 LAB — LIPASE, BLOOD: Lipase: 18 U/L (ref 11–51)

## 2019-12-03 LAB — CBC
HCT: 46.8 % — ABNORMAL HIGH (ref 36.0–46.0)
Hemoglobin: 15.5 g/dL — ABNORMAL HIGH (ref 12.0–15.0)
MCH: 32.5 pg (ref 26.0–34.0)
MCHC: 33.1 g/dL (ref 30.0–36.0)
MCV: 98.1 fL (ref 80.0–100.0)
Platelets: 316 10*3/uL (ref 150–400)
RBC: 4.77 MIL/uL (ref 3.87–5.11)
RDW: 13.3 % (ref 11.5–15.5)
WBC: 13.3 10*3/uL — ABNORMAL HIGH (ref 4.0–10.5)
nRBC: 0 % (ref 0.0–0.2)

## 2019-12-03 MED ORDER — IOHEXOL 300 MG/ML  SOLN
100.0000 mL | Freq: Once | INTRAMUSCULAR | Status: AC | PRN
  Administered 2019-12-03: 100 mL via INTRAVENOUS

## 2019-12-03 MED ORDER — SODIUM CHLORIDE 0.9% FLUSH: 3.0000 mL | Freq: Once | INTRAVENOUS | Status: DC

## 2019-12-03 MED ORDER — MORPHINE SULFATE (PF) 4 MG/ML IV SOLN
4.0000 mg | Freq: Once | INTRAVENOUS | Status: AC
  Administered 2019-12-03: 4 mg via INTRAVENOUS
  Filled 2019-12-03: qty 1

## 2019-12-03 MED ORDER — MINERAL OIL RE ENEM
1.0000 | ENEMA | Freq: Once | RECTAL | Status: DC
  Filled 2019-12-03: qty 1

## 2019-12-03 NOTE — ED Triage Notes (Signed)
Patient c/o LLQ discomfort x 1 week. Patient states "discomfort to me could be worse because it is me." Patient states she had an abscess on her diaphragm previous and is afraid she has another one. Patient c/o feeling fatigue.

## 2019-12-03 NOTE — ED Provider Notes (Signed)
[facility] DEPT Provider Note   CSN: [id number] Arrival date & time: 12/03/19  [id number]     History Chief Complaint  Patient presents with  . Abdominal Pain    [name] is a 69 y.o. female.  HPI     69 year old comes in with chief complaint of abdominal pain. Patient has history of diverticulitis and multiple abdominal surgeries.  She reports that over the past few days she has been having generalized weakness and abdominal discomfort.  Given her prior surgeries it is hard for her to express her symptoms, but she has generalized discomfort over the left side of her abdomen.  She has no UTI-like symptoms and denies any history of kidney stones.  Patient has no vomiting, fevers but she reports subjective chills and nausea.  Patient's p.o. intake has come down.  Past Medical History:  Diagnosis Date  . Arthritis   . Cervical spinal stenosis   . Complication of anesthesia    "makes me cry for days."  . Diverticulitis   . Family history of anesthesia complication    Sister has nausea and vomitting.  Marland Kitchen Hypercholesteremia   . Hypertension     Patient Active Problem List   Diagnosis Date Noted  . Vitamin D deficiency 01/02/2016  . Arthropathy 01/02/2016  . Tremor 01/02/2016  . Primary generalized (osteo)arthritis 01/02/2016  . Cigarette nicotine dependence, uncomplicated [id number]  . Female climacteric state 01/02/2016  . Hyperglycemia 01/02/2016  . Essential (primary) hypertension 01/02/2016  . Elevated white blood cell count 01/02/2016  . 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 Cervicalgia 01/02/2016  . Radiculopathy of cervical region 01/02/2016  . Essential tremor 01/02/2016  . Anxiety disorder 01/02/2016  . Pure hypercholesterolemia 01/02/2016  . Weakness 01/02/2016  . Fatigue 01/02/2016  . HLD (hyperlipidemia) 01/02/2016  . Body mass index (BMI) of 35.0-35.9 in adult 08/28/2014    Past  Surgical History:  Procedure Laterality Date  . ABDOMINAL HYSTERECTOMY  1995  . ANTERIOR CERVICAL DECOMP/DISCECTOMY FUSION  12/30/11  . BACK SURGERY  1992   . COLECTOMY  2004   "lost 1/3 of my colon"  . THORACIC FUSION  1992  . TUMOR REMOVAL     small intestine     OB History   No obstetric history on file.     Family History  Problem Relation Age of Onset  . Anesthesia problems Sister   . Allergic rhinitis Sister   . Food Allergy Sister        shellfish  . Angioedema Neg Hx   . Asthma Neg Hx   . Eczema Neg Hx   . Urticaria Neg Hx     Social History   Tobacco Use  . Smoking status: Current Every Day Smoker    Packs/day: 0.50    Years: 40.00    Pack years: 20.00    Types: Cigarettes  . Smokeless tobacco: Never Used  . Tobacco comment: pt states she has not smoked since 07/26/18  Substance Use Topics  . Alcohol use: No    Alcohol/week: 0.0 standard drinks  . Drug use: No    Home Medications Prior to Admission medications   Medication Sig Start Date End Date Taking? Authorizing Provider  acetaminophen (TYLENOL) 500 MG tablet Take 500 mg by mouth every 6 (six) hours as needed for mild pain, moderate pain, fever or headache.   Yes [provider]  ALPRAZolam Duanne Moron) 0.5 MG tablet Take 0.25 tablets by mouth every 6 (  six) hours as needed for anxiety or sleep. anxiety 02/20/15  Yes [provider]  betamethasone dipropionate 0.05 % lotion Apply 1 application topically 2 (two) times daily as needed (scalp).   Yes [provider]  calcium carbonate (OS-CAL - DOSED IN MG OF ELEMENTAL CALCIUM) 1250 (500 Ca) MG tablet Take 1 tablet by mouth 3 (three) times a week.   Yes [provider]  Cholecalciferol (VITAMIN D) 50 MCG (2000 UT) tablet Take 2,000 Units by mouth 3 (three) times a week.    Yes [provider]  losartan (COZAAR) 25 MG tablet Take 25 mg by mouth daily.  03/27/18  Yes [provider]  Multiple Vitamins-Iron  (MULTI-VITAMIN/IRON PO) Take 1 tablet by mouth daily.    Yes [provider]  HYDROcodone-acetaminophen (NORCO) 5-325 MG tablet Take 1-2 tablets by mouth every 6 (six) hours as needed. Patient not taking: Reported on 12/03/2019 08/03/15   Veryl Speak, MD    Allergies    Lyrica [pregabalin], Simvastatin, Statins, and Meloxicam  Review of Systems   Review of Systems  Constitutional: Positive for activity change.  Respiratory: Negative for shortness of breath.   Cardiovascular: Negative for chest pain.  Gastrointestinal: Positive for abdominal pain and nausea.  Hematological: Does not bruise/bleed easily.  All other systems reviewed and are negative.   Physical Exam Updated Vital Signs BP (!) 158/82   Pulse 64   Temp 98.2 F (36.8 C) (Oral)   Resp (!) 21   Ht 5\' 5"  (1.651 m)   Wt 92.5 kg   SpO2 94%   BMI 33.95 kg/m   Physical Exam Vitals and nursing note reviewed.  Constitutional:      Appearance: She is well-developed.  HENT:     Head: Normocephalic and atraumatic.  Cardiovascular:     Rate and Rhythm: Normal rate.  Pulmonary:     Effort: Pulmonary effort is normal.  Abdominal:     General: Bowel sounds are normal.     Tenderness: There is abdominal tenderness in the suprapubic area and left lower quadrant. There is no guarding or rebound.  Musculoskeletal:     Cervical back: Normal range of motion and neck supple.  Skin:    General: Skin is warm and dry.  Neurological:     Mental Status: She is alert and oriented to person, place, and time.     ED Results / Procedures / Treatments   Labs (all labs ordered are listed, but only abnormal results are displayed) Labs Reviewed  COMPREHENSIVE METABOLIC PANEL - Abnormal; Notable for the following components:      Result Value   Total Protein 9.0 (*)    All other components within normal limits  CBC - Abnormal; Notable for the following components:   WBC 13.3 (*)    Hemoglobin 15.5 (*)    HCT 46.8 (*)      All other components within normal limits  LIPASE, BLOOD  URINALYSIS, ROUTINE W REFLEX MICROSCOPIC    EKG None  Radiology CT ABDOMEN PELVIS W CONTRAST  Result Date: 12/03/2019 CLINICAL DATA:  Left lower quadrant pain, fatigue x1 week. Prior colectomy and hysterectomy. EXAM: CT ABDOMEN AND PELVIS WITH CONTRAST TECHNIQUE: Multidetector CT imaging of the abdomen and pelvis was performed using the standard protocol following bolus administration of intravenous contrast. CONTRAST:  175mL OMNIPAQUE IOHEXOL 300 MG/ML  SOLN COMPARISON:  08/12/2018 FINDINGS: Lower chest: Lung bases are clear. Hepatobiliary: Liver is notable for hepatic steatosis with focal fatty sparing along the  posterior aspect of segment 4 (series 2/image 19). Gallbladder is unremarkable. No intrahepatic or extrahepatic ductal dilatation. Pancreas: Mild parenchymal calcifications, suggesting sequela of prior/chronic pancreatitis. Spleen: Within normal limits. Adrenals/Urinary Tract: Mild thickening of the left adrenal gland, chronic. Right adrenal gland is within normal limits. Left renal cysts, measuring up to 1.9 cm in the lateral left lower pole (series 7/image 20), benign (Bosniak I). Right kidney is within normal limits. No hydronephrosis. Bladder is within normal limits. Stomach/Bowel: Stomach is within normal limits. Prior small bowel resection with suture line in the left mid abdomen (series 2/image 23). Stable appearance of a loop of small bowel in this region, likely postsurgical. No evidence of bowel obstruction. Normal appendix (series 2/image 50). Mild scattered colonic diverticulosis, without evidence of diverticulitis. Vascular/Lymphatic: No evidence of abdominal aortic aneurysm. Atherosclerotic calcifications of the abdominal aorta and branch vessels. No suspicious abdominopelvic lymphadenopathy. Reproductive: Status post hysterectomy. No adnexal masses. Other: No abdominopelvic ascites. Diastasis of the midline anterior  abdominal wall (series 2/image 43). Additional tiny periumbilical hernia containing fat and a knuckle of small bowel (series 2/image 46), without associated inflammatory changes, unchanged. Musculoskeletal: Degenerative changes of the visualized thoracolumbar spine. IMPRESSION: Mild colonic diverticulosis, without evidence of diverticulitis. No evidence of bowel obstruction.  Normal appendix. No CT findings to account for the patient's left lower quadrant abdominal pain. Electronically Signed   By: Julian Hy M.D.   On: 12/03/2019 13:09    Procedures Procedures (including critical care time)  Medications Ordered in ED Medications  sodium chloride flush (NS) 0.9 % injection 3 mL (3 mLs Intravenous Not Given 12/03/19 1102)  morphine 4 MG/ML injection 4 mg (4 mg Intravenous Given 12/03/19 1206)  iohexol (OMNIPAQUE) 300 MG/ML solution 100 mL (100 mLs Intravenous Contrast Given 12/03/19 1252)    ED Course  I have reviewed the triage vital signs and the nursing notes.  Pertinent labs & imaging results that were available during my care of the patient were reviewed by me and considered in my medical decision making (see chart for details).  Clinical Course as of Dec 02 1352  Mon Dec 03, 2019  1349 CT ABDOMEN PELVIS W CONTRAST [AN]  [id number] CT results are reassuring.  No diverticulitis, no free air, no abscess or obstruction.  Results discussed with the patient.  She still has not given [place] a urine sample, however she does not think that she has UTI.  She is preferring outpatient follow-up with her PCP and they can check her urine analysis.  She has no burning with urination, frequent urination or blood in the urine.  Prior UTI were presenting with burning with urination.  The patient appears reasonably screened and/or stabilized for discharge and I doubt any other medical condition or other [facility] requiring further screening, evaluation, or treatment in the ED at this time prior to  discharge.  Results from the ER workup discussed with the patient face to face and all questions answered to the best of my ability. The patient is safe for discharge with strict return precautions.     [AN]    Clinical Course User Index [AN] Varney Biles, MD   MDM Rules/Calculators/A&P                      69 year old comes in a chief complaint of generalized abdominal pain.  She is unable to describe the pain but it seems like it has been present for few days and has gotten worse recently.  She has  history of diverticulosis and is noted to have pain in the suprapubic and left lower quadrant.  Concerns are for diverticulitis.  Small bowel obstruction, ileus and UTI also considered in the differential diagnosis.  Appropriate work-up initiated.  Final Clinical Impression(s) / ED Diagnoses Final diagnoses:  Generalized abdominal pain    Rx / DC Orders ED Discharge Orders    None       Varney Biles, MD 12/03/19 1355

## 2019-12-03 NOTE — Discharge Instructions (Signed)
All the results in the ER are normal, labs and imaging. We are not sure what is causing your symptoms. The workup in the ER is not complete, and is limited to screening for life threatening and emergent conditions only, so please see a primary care doctor for further evaluation.  

## 2019-12-10 DIAGNOSIS — E7800 Pure hypercholesterolemia, unspecified: Secondary | ICD-10-CM | POA: Diagnosis not present

## 2019-12-10 DIAGNOSIS — I10 Essential (primary) hypertension: Secondary | ICD-10-CM | POA: Diagnosis not present

## 2019-12-10 DIAGNOSIS — G720 Drug-induced myopathy: Secondary | ICD-10-CM | POA: Diagnosis not present

## 2019-12-10 DIAGNOSIS — E1169 Type 2 diabetes mellitus with other specified complication: Secondary | ICD-10-CM | POA: Diagnosis not present

## 2019-12-18 DIAGNOSIS — E669 Obesity, unspecified: Secondary | ICD-10-CM | POA: Diagnosis not present

## 2019-12-18 DIAGNOSIS — K5732 Diverticulitis of large intestine without perforation or abscess without bleeding: Secondary | ICD-10-CM | POA: Diagnosis not present

## 2019-12-27 ENCOUNTER — Encounter (HOSPITAL_COMMUNITY): Payer: Self-pay | Admitting: Emergency Medicine

## 2019-12-27 ENCOUNTER — Emergency Department (HOSPITAL_COMMUNITY)
Admission: EM | Admit: 2019-12-27 | Discharge: 2019-12-27 | Disposition: A | Discharge status: Home / Self Care | Payer: Medicare Other | Attending: Emergency Medicine | Admitting: Emergency Medicine

## 2019-12-27 ENCOUNTER — Emergency Department (HOSPITAL_COMMUNITY): Payer: Medicare Other

## 2019-12-27 ENCOUNTER — Other Ambulatory Visit: Payer: Self-pay

## 2019-12-27 DIAGNOSIS — R1030 Lower abdominal pain, unspecified: Secondary | ICD-10-CM | POA: Diagnosis not present

## 2019-12-27 DIAGNOSIS — I10 Essential (primary) hypertension: Secondary | ICD-10-CM | POA: Diagnosis not present

## 2019-12-27 DIAGNOSIS — Z79899 Other long term (current) drug therapy: Secondary | ICD-10-CM | POA: Insufficient documentation

## 2019-12-27 DIAGNOSIS — F17210 Nicotine dependence, cigarettes, uncomplicated: Secondary | ICD-10-CM | POA: Diagnosis not present

## 2019-12-27 DIAGNOSIS — K625 Hemorrhage of anus and rectum: Secondary | ICD-10-CM

## 2019-12-27 DIAGNOSIS — K760 Fatty (change of) liver, not elsewhere classified: Secondary | ICD-10-CM | POA: Diagnosis not present

## 2019-12-27 DIAGNOSIS — K429 Umbilical hernia without obstruction or gangrene: Secondary | ICD-10-CM | POA: Diagnosis not present

## 2019-12-27 LAB — CBC
HCT: 43.8 % (ref 36.0–46.0)
Hemoglobin: 14.7 g/dL (ref 12.0–15.0)
MCH: 32.4 pg (ref 26.0–34.0)
MCHC: 33.6 g/dL (ref 30.0–36.0)
MCV: 96.5 fL (ref 80.0–100.0)
Platelets: 281 10*3/uL (ref 150–400)
RBC: 4.54 MIL/uL (ref 3.87–5.11)
RDW: 13.4 % (ref 11.5–15.5)
WBC: 11.7 10*3/uL — ABNORMAL HIGH (ref 4.0–10.5)
nRBC: 0 % (ref 0.0–0.2)

## 2019-12-27 LAB — URINALYSIS, ROUTINE W REFLEX MICROSCOPIC
Bilirubin Urine: NEGATIVE
Glucose, UA: NEGATIVE mg/dL
Hgb urine dipstick: NEGATIVE
Ketones, ur: NEGATIVE mg/dL
Leukocytes,Ua: NEGATIVE
Nitrite: NEGATIVE
Protein, ur: NEGATIVE mg/dL
Specific Gravity, Urine: 1.002 — ABNORMAL LOW (ref 1.005–1.030)
pH: 7 (ref 5.0–8.0)

## 2019-12-27 LAB — COMPREHENSIVE METABOLIC PANEL
ALT: 23 U/L (ref 0–44)
AST: 19 U/L (ref 15–41)
Albumin: 3.6 g/dL (ref 3.5–5.0)
Alkaline Phosphatase: 43 U/L (ref 38–126)
Anion gap: 9 (ref 5–15)
BUN: 12 mg/dL (ref 8–23)
CO2: 24 mmol/L (ref 22–32)
Calcium: 8.5 mg/dL — ABNORMAL LOW (ref 8.9–10.3)
Chloride: 103 mmol/L (ref 98–111)
Creatinine, Ser: 0.95 mg/dL (ref 0.44–1.00)
GFR calc Af Amer: >60 mL/min (ref >60)
GFR calc non Af Amer: >60 mL/min (ref >60)
Glucose, Bld: 123 mg/dL — ABNORMAL HIGH (ref 70–99)
Potassium: 3.6 mmol/L (ref 3.5–5.1)
Sodium: 136 mmol/L (ref 135–145)
Total Bilirubin: 0.6 mg/dL (ref 0.3–1.2)
Total Protein: 8.3 g/dL — ABNORMAL HIGH (ref 6.5–8.1)

## 2019-12-27 LAB — LIPASE, BLOOD: Lipase: 14 U/L (ref 11–51)

## 2019-12-27 LAB — TYPE AND SCREEN
ABO/RH(D): B POS
Antibody Screen: NEGATIVE

## 2019-12-27 LAB — ABO/RH: ABO/RH(D): B POS

## 2019-12-27 MED ORDER — HYDROMORPHONE HCL 1 MG/ML IJ SOLN
1.0000 mg | Freq: Once | INTRAMUSCULAR | Status: AC
  Administered 2019-12-27: 1 mg via INTRAVENOUS
  Filled 2019-12-27: qty 1

## 2019-12-27 MED ORDER — SODIUM CHLORIDE 0.9 % IV BOLUS
1000.0000 mL | Freq: Once | INTRAVENOUS | Status: AC
  Administered 2019-12-27: 1000 mL via INTRAVENOUS

## 2019-12-27 MED ORDER — IOHEXOL 300 MG/ML  SOLN
100.0000 mL | Freq: Once | INTRAMUSCULAR | Status: AC | PRN
  Administered 2019-12-27: 100 mL via INTRAVENOUS

## 2019-12-27 MED ORDER — SODIUM CHLORIDE (PF) 0.9 % IJ SOLN
INTRAMUSCULAR | Status: AC
  Filled 2019-12-27: qty 50

## 2019-12-27 NOTE — Discharge Instructions (Signed)
If you notice new or worsening bleeding, new or worsening abdominal pain, vomiting, dizziness, or any other new/concerning symptoms then return to the ER for evaluation.

## 2019-12-27 NOTE — ED Triage Notes (Signed)
Pt has diverticulitis, taking antibiotics 5-6 days. Reports having bloody stools.

## 2019-12-27 NOTE — ED Provider Notes (Signed)
[facility] DEPT Provider Note   CSN: [id number] Arrival date & time: 12/27/19  0732     History Chief Complaint  Patient presents with  . Rectal Bleeding    [name] is a 69 y.o. female.  HPI 69 year old female presents with rectal bleeding.  Started this morning and has happened twice.  Two episodes of a large amount of blood in the toilet, she is unsure if there was stool.  There were no clots.  She is currently on day five of Cipro and Flagyl for diverticulitis.  She states that her gastroenterologist, Dr. Collene Mares, places her on it whenever her WBC is elevated and it was fourteen last week.  Has otherwise been having 1-2 weeks of left lower abdominal burning.  No urinary symptoms.  No fevers but states she never runs a fever.  She had some bleeding several weeks ago that seemed to resolve on its own. Has moderate pain now.   Past Medical History:  Diagnosis Date  . Arthritis   . Cervical spinal stenosis   . Complication of anesthesia    "makes me cry for days."  . Diverticulitis   . Family history of anesthesia complication    Sister has nausea and vomitting.  Marland Kitchen Hypercholesteremia   . Hypertension     Patient Active Problem List   Diagnosis Date Noted  . Vitamin D deficiency 01/02/2016  . Arthropathy 01/02/2016  . Tremor 01/02/2016  . Primary generalized (osteo)arthritis 01/02/2016  . Cigarette nicotine dependence, uncomplicated [id number]  . Female climacteric state 01/02/2016  . Hyperglycemia 01/02/2016  . Essential (primary) hypertension 01/02/2016  . Elevated white blood cell count 01/02/2016  . 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 Cervicalgia 01/02/2016  . Radiculopathy of cervical region 01/02/2016  . Essential tremor 01/02/2016  . Anxiety disorder 01/02/2016  . Pure hypercholesterolemia 01/02/2016  . Weakness 01/02/2016  . Fatigue 01/02/2016  . HLD (hyperlipidemia) 01/02/2016  . Body  mass index (BMI) of 35.0-35.9 in adult 08/28/2014    Past Surgical History:  Procedure Laterality Date  . ABDOMINAL HYSTERECTOMY  1995  . ANTERIOR CERVICAL DECOMP/DISCECTOMY FUSION  12/30/11  . BACK SURGERY  1992   . COLECTOMY  2004   "lost 1/3 of my colon"  . THORACIC FUSION  1992  . TUMOR REMOVAL     small intestine     OB History   No obstetric history on file.     Family History  Problem Relation Age of Onset  . Anesthesia problems Sister   . Allergic rhinitis Sister   . Food Allergy Sister        shellfish  . Angioedema Neg Hx   . Asthma Neg Hx   . Eczema Neg Hx   . Urticaria Neg Hx     Social History   Tobacco Use  . Smoking status: Current Every Day Smoker    Packs/day: 0.50    Years: 40.00    Pack years: 20.00    Types: Cigarettes  . Smokeless tobacco: Never Used  . Tobacco comment: pt states she has not smoked since 07/26/18  Substance Use Topics  . Alcohol use: No    Alcohol/week: 0.0 standard drinks  . Drug use: No    Home Medications Prior to Admission medications   Medication Sig Start Date End Date Taking? Authorizing Provider  acetaminophen (TYLENOL) 500 MG tablet Take 500 mg by mouth every 6 (six) hours as needed for mild pain, moderate pain, fever  or headache.   Yes [provider]  ALPRAZolam Duanne Moron) 0.5 MG tablet Take 0.25 tablets by mouth every 6 (six) hours as needed for anxiety or sleep. anxiety 02/20/15  Yes [provider]  Ascorbic Acid (VITAMIN C PO) Take 1 tablet by mouth daily.   Yes [provider]  betamethasone dipropionate 0.05 % lotion Apply 1 application topically 2 (two) times daily as needed (scalp).   Yes [provider]  Cholecalciferol (VITAMIN D) 50 MCG (2000 UT) tablet Take 2,000 Units by mouth daily.    Yes [provider]  ciprofloxacin (CIPRO) 500 MG tablet Take 500 mg by mouth every 12 (twelve) hours. 25 day supply 12/24/19  Yes [provider]  losartan (COZAAR) 25  MG tablet Take 25 mg by mouth daily.  03/27/18  Yes [provider]  metroNIDAZOLE (FLAGYL) 250 MG tablet Take 250 mg by mouth 3 (three) times daily. 30 day supply 12/24/19  Yes [provider]  Multiple Vitamins-Iron (MULTI-VITAMIN/IRON PO) Take 1 tablet by mouth daily.    Yes [provider]  HYDROcodone-acetaminophen (NORCO) 5-325 MG tablet Take 1-2 tablets by mouth every 6 (six) hours as needed. Patient not taking: Reported on 12/03/2019 08/03/15   Veryl Speak, MD    Allergies    Lyrica [pregabalin], Simvastatin, Statins, and Meloxicam  Review of Systems   Review of Systems  Constitutional: Negative for fever.  Gastrointestinal: Positive for abdominal pain and blood in stool. Negative for rectal pain and vomiting.  Genitourinary: Negative for dysuria.  All other systems reviewed and are negative.   Physical Exam Updated Vital Signs BP (!) 148/88   Pulse 62   Temp 98.3 F (36.8 C) (Oral)   Resp 16   SpO2 98%   Physical Exam Vitals and nursing note reviewed. Exam conducted with a chaperone present.  Constitutional:      General: She is not in acute distress.    Appearance: She is well-developed. She is obese. She is not ill-appearing or diaphoretic.  HENT:     Head: Normocephalic and atraumatic.     Right Ear: External ear normal.     Left Ear: External ear normal.     Nose: Nose normal.  Eyes:     General:        Right eye: No discharge.        Left eye: No discharge.  Cardiovascular:     Rate and Rhythm: Normal rate and regular rhythm.     Heart sounds: Normal heart sounds.  Pulmonary:     Effort: Pulmonary effort is normal.     Breath sounds: Normal breath sounds.  Abdominal:     Palpations: Abdomen is soft.     Tenderness: There is abdominal tenderness in the right lower quadrant, suprapubic area, left upper quadrant and left lower quadrant.  Genitourinary:    Rectum: Guaiac result positive. External hemorrhoid present. No mass or  tenderness.     Comments: Mild amount of BRBPR on exam. Non-thrombosed hemorrhoids present. Skin:    General: Skin is warm and dry.  Neurological:     Mental Status: She is alert.  Psychiatric:        Mood and Affect: Mood is not anxious.     ED Results / Procedures / Treatments   Labs (all labs ordered are listed, but only abnormal results are displayed) Labs Reviewed  COMPREHENSIVE METABOLIC PANEL - Abnormal; Notable for the following components:      Result Value   Glucose,  Bld 123 (*)    Calcium 8.5 (*)    Total Protein 8.3 (*)    All other components within normal limits  CBC - Abnormal; Notable for the following components:   WBC 11.7 (*)    All other components within normal limits  URINALYSIS, ROUTINE W REFLEX MICROSCOPIC - Abnormal; Notable for the following components:   Color, Urine STRAW (*)    Specific Gravity, Urine 1.002 (*)    All other components within normal limits  LIPASE, BLOOD  TYPE AND SCREEN  ABO/RH    EKG None  Radiology CT ABDOMEN PELVIS W CONTRAST  Result Date: 12/27/2019 CLINICAL DATA:  Pain with blood in stool EXAM: CT ABDOMEN AND PELVIS WITH CONTRAST TECHNIQUE: Multidetector CT imaging of the abdomen and pelvis was performed using the standard protocol following bolus administration of intravenous contrast. CONTRAST:  180mL OMNIPAQUE IOHEXOL 300 MG/ML  SOLN COMPARISON:  December 03, 2019 FINDINGS: Lower chest: Lung bases are clear. Hepatobiliary: There is hepatic steatosis. No focal liver lesions are evident. Gallbladder wall is not appreciably thickened. There is no biliary duct dilatation. Pancreas: There are occasional tiny foci of pancreatic calcification consistent with a degree of chronic pancreatitis. No acute appearing pancreatic inflammation. No pancreatic mass or duct dilatation. Spleen: No splenic lesions are evident. Adrenals/Urinary Tract: Right adrenal appears normal. There is stable left adrenal hypertrophy. There is a cyst arising from  the medial upper pole of the left kidney measuring 1.4 x 1.1 cm. There is a cyst arising from the lower pole left kidney laterally measuring 1.9 x 1.8 cm. There is no hydronephrosis on either side. There is no evident renal or ureteral calculus on either side. The urinary bladder is midline with wall thickness within normal limits. Stomach/Bowel: Moderate stool noted in the colon. There is no appreciable bowel wall or mesenteric thickening. No appreciable diverticular inflammation. Postoperative change noted on the left with anastomosis patent. There is a loop of small bowel extending into a midline ventral hernia without evident bowel compromise. There is no evident bowel obstruction. The terminal ileum appears normal. There is no evident free air or portal venous air. Vascular/Lymphatic: There is no abdominal aortic aneurysm. There is aortic and common iliac artery atherosclerotic calcification. No adenopathy is evident in the abdomen or pelvis. Reproductive: Uterus is absent.  No pelvic mass evident. Other: Midline ventral hernia containing small bowel but no bowel compromise. The neck of the hernia from right to left dimension measures 5.3 cm. The neck of the hernia from superior to inferior dimension measures 2.8 cm. No other bowel conned via there is a second more inferior ventral hernia containing a loop of small bowel without bowel compromise in the mid pelvic region just to the right of midline. This hernia at its neck measures 4.3 cm from right to left dimension and 3.3 cm from superior to inferior dimension. No abscess or ascites evident in the abdomen or pelvis. No appendiceal region inflammation. Musculoskeletal: There is degenerative change in the lumbar spine, most severe at L5-S1. No blastic or lytic bone lesions. No intramuscular lesions are evident. IMPRESSION: 1. Periumbilical region umbilical hernia containing a loop of small bowel without bowel compromise. Ventral hernia just to the right of  midline in the upper pelvis containing a loop of small bowel without bowel compromise. 2. No diverticulitis. No bowel wall thickening or bowel obstruction. No abscess in the abdomen or pelvis. No periappendiceal region inflammation. 3. No renal or ureteral calculi. No hydronephrosis. Urinary bladder wall thickness  normal. 4.  Hepatic steatosis. 5. Evidence of chronic pancreatitis. No acute appearing pancreatitis. 6. Postoperative change in the left abdomen. Postoperative bowel anastomosis patent. 7.  Aortic Atherosclerosis (ICD10-I70.0). 8.  Stable left adrenal hypertrophy. Electronically Signed   By: Lowella Grip III M.D.   On: 12/27/2019 11:06    Procedures Procedures (including critical care time)  Medications Ordered in ED Medications  sodium chloride (PF) 0.9 % injection (has no administration in time range)  sodium chloride 0.9 % bolus 1,000 mL (0 mLs Intravenous Stopped 12/27/19 1142)  HYDROmorphone (DILAUDID) injection 1 mg (1 mg Intravenous Given 12/27/19 0847)  iohexol (OMNIPAQUE) 300 MG/ML solution 100 mL (100 mLs Intravenous Contrast Given 12/27/19 1027)    ED Course  I have reviewed the triage vital signs and the nursing notes.  Pertinent labs & imaging results that were available during my care of the patient were reviewed by me and considered in my medical decision making (see chart for details).    MDM Rules/Calculators/A&P                      Labs and CT images have been personally reviewed.  Minimal leukocytosis.  Hemoglobin is okay and near her baseline.  She did have some blood on rectal exam but has had no further rectal bleeding.  Discussed with Dr. Benson Norway, who advised that she follow-up with Dr. Collene Mares as an outpatient but no real benefit for keeping her in the hospital, especially since it seems like bleeding has stopped and she is not on blood thinners.  Discussed plan with patient and will discharge home with return precautions. Final Clinical Impression(s) / ED  Diagnoses Final diagnoses:  Bright red rectal bleeding    Rx / DC Orders ED Discharge Orders    None       Sherwood Gambler, MD 12/27/19 1455

## 2020-01-17 DIAGNOSIS — E669 Obesity, unspecified: Secondary | ICD-10-CM | POA: Diagnosis not present

## 2020-01-17 DIAGNOSIS — R140 Abdominal distension (gaseous): Secondary | ICD-10-CM | POA: Diagnosis not present

## 2020-01-17 DIAGNOSIS — K5730 Diverticulosis of large intestine without perforation or abscess without bleeding: Secondary | ICD-10-CM | POA: Diagnosis not present

## 2020-01-17 DIAGNOSIS — K429 Umbilical hernia without obstruction or gangrene: Secondary | ICD-10-CM | POA: Diagnosis not present

## 2020-02-04 DIAGNOSIS — M47812 Spondylosis without myelopathy or radiculopathy, cervical region: Secondary | ICD-10-CM | POA: Diagnosis not present

## 2020-02-11 DIAGNOSIS — L218 Other seborrheic dermatitis: Secondary | ICD-10-CM | POA: Diagnosis not present

## 2020-03-03 DIAGNOSIS — M542 Cervicalgia: Secondary | ICD-10-CM | POA: Diagnosis not present

## 2020-03-03 DIAGNOSIS — M47812 Spondylosis without myelopathy or radiculopathy, cervical region: Secondary | ICD-10-CM | POA: Diagnosis not present

## 2020-03-12 DIAGNOSIS — L853 Xerosis cutis: Secondary | ICD-10-CM | POA: Diagnosis not present

## 2020-03-12 DIAGNOSIS — L218 Other seborrheic dermatitis: Secondary | ICD-10-CM | POA: Diagnosis not present

## 2020-03-14 ENCOUNTER — Encounter: Payer: Self-pay | Admitting: Physical Therapy

## 2020-03-14 ENCOUNTER — Other Ambulatory Visit: Payer: Self-pay

## 2020-03-14 ENCOUNTER — Ambulatory Visit: Payer: Medicare Other | Attending: Neurosurgery | Admitting: Physical Therapy

## 2020-03-14 DIAGNOSIS — M542 Cervicalgia: Secondary | ICD-10-CM | POA: Insufficient documentation

## 2020-03-14 DIAGNOSIS — M6281 Muscle weakness (generalized): Secondary | ICD-10-CM | POA: Diagnosis not present

## 2020-03-14 DIAGNOSIS — R252 Cramp and spasm: Secondary | ICD-10-CM | POA: Diagnosis not present

## 2020-03-14 NOTE — Therapy (Signed)
[facility] [place], [place], [address] Phone: [phone]   Fax:  [phone]  Physical Therapy Evaluation  Patient Details  Name: [name] MRN: [id number] Date of Birth: 1950-09-07 Referring Provider (PT): Leonie Green   Encounter Date: 03/14/2020   PT End of Session - 03/14/20 1051    Visit Number 1    Date for PT Re-Evaluation 05/15/20    PT Start Time 0930    PT Stop Time 6606    PT Time Calculation (min) 45 min    Activity Tolerance Patient tolerated treatment well    Behavior During Therapy [facility] for tasks assessed/performed           Past Medical History:  Diagnosis Date  . Arthritis   . Cervical spinal stenosis   . Complication of anesthesia    "makes me cry for days."  . Diverticulitis   . Family history of anesthesia complication    Sister has nausea and vomitting.  Marland Kitchen Hypercholesteremia   . Hypertension     Past Surgical History:  Procedure Laterality Date  . ABDOMINAL HYSTERECTOMY  1995  . ANTERIOR CERVICAL DECOMP/DISCECTOMY FUSION  12/30/11  . BACK SURGERY  1992   . COLECTOMY  2004   "lost 1/3 of my colon"  . THORACIC FUSION  1992  . TUMOR REMOVAL     small intestine    There were no vitals filed for this visit.    Subjective Assessment - 03/14/20 0930    Subjective Pt reports she had a 4 level fusion in c-spine in 2013 after which she woke up with a postoperative c5 radiculopathy. Pt reports chronic neck pain worsening over the past year since COVID; was attending an exercise class for seniors but has not been for 1.5 years. States that she is unsure of what exercises to do at home. Pt reports tightness and pain in mid back to upper neck. Pt denies N/T in UE. Pt states tremors in hands are getting worse.    Pertinent History HTN, arthritis    Limitations Sitting;Lifting;House hold activities    Diagnostic tests xrays    Patient Stated Goals reduce muscle spasms,  reduce pain    Currently in Pain? Yes    Pain Score 3     Pain Location Neck    Pain Orientation Mid;Right;Left    Pain Descriptors / Indicators Throbbing;Tightness    Pain Type Chronic pain    Pain Onset More than a month ago    Pain Frequency Intermittent    Aggravating Factors  reaching, lifting, prolonged standing    Pain Relieving Factors heat, exercises, rest              [facility] PT Assessment - 03/14/20 0001      Assessment   Medical Diagnosis cervicalgia    Referring Provider (PT) Leonie Green    Hand Dominance Right    Prior Therapy PT for cspine      Precautions   Precautions None      Restrictions   Weight Bearing Restrictions No      Balance Screen   Has the patient fallen in the past 6 months No    Has the patient had a decrease in activity level because of a fear of falling?  No    Is the patient reluctant to leave their home because of a fear of falling?  No      Home Environment   Additional Comments  2 stairs to enter; reports no trouble with stairs      Prior Function   Level of Independence Independent    Vocation Retired    Leisure light exercises/exercise group, church activities, shopping      Coordination   Gross Motor Movements are Fluid and Coordinated No   significant tremors B with UE movement   Fine Motor Movements are Fluid and Coordinated No   difficulty with fine motor movements esp RUE   Finger Nose Finger Test difficulty with accuracyB      Posture/Postural Control   Posture/Postural Control Postural limitations    Postural Limitations Rounded Shoulders;Forward head      ROM / Strength   AROM / PROM / Strength AROM;Strength      AROM   Overall AROM Comments functional R IR/ER limited; otherwise [facility]    AROM Assessment Site Cervical    Cervical Flexion 30    Cervical Extension 20    Cervical - Right Side Bend 20    Cervical - Left Side Bend 20    Cervical - Right Rotation 40    Cervical - Left Rotation 50      Strength    Overall Strength Comments BUE strength 4+/5      Palpation   Palpation comment tender to palpation B UT/cervical parapsinals                      Objective measurements completed on examination: See above findings.       [facility] PT Treatment/Exercise - 03/14/20 0001      Exercises   Exercises Neck      Neck Exercises: Theraband   Scapula Retraction 10 reps      Neck Exercises: Standing   Neck Retraction 10 reps;3 secs      Neck Exercises: Stretches   Upper Trapezius Stretch Right;Left;1 rep;30 seconds    Levator Stretch Right;Left;1 rep;30 seconds    Corner Stretch 1 rep;30 seconds                  PT Education - 03/14/20 1050    Education Details Pt educated on POC and HEP    Person(s) Educated Patient    Methods Explanation;Demonstration;Handout    Comprehension Verbalized understanding;Returned demonstration            PT Short Term Goals - 03/14/20 1114      PT SHORT TERM GOAL #1   Title Pt will be independent with HEP    Time 2    Period Weeks    Target Date 03/28/20             PT Long Term Goals - 03/14/20 1136      PT LONG TERM GOAL #1   Title Pt will demonstrate cervical ROM WFL    Time 8    Period Weeks    Status New    Target Date 05/09/20      PT LONG TERM GOAL #2   Title Pt will report reduction in cervical pain by 50%    Time 8    Period Weeks    Status New    Target Date 05/09/20      PT LONG TERM GOAL #3   Title Pt will demo understanding of upright posture and improved body mechanics    Time 8    Period Weeks    Status New    Target Date 05/09/20      PT LONG TERM GOAL #  4   Title Pt will report ability to complete household ADLs without increase in cervical pain    Time 8    Period Weeks    Target Date 05/09/20                  Plan - 03/14/20 1106    Clinical Impression Statement Pt presents to clinic with reports of chronic cervical pain present since 4 level fusion in 2013.  Following fusion pt experienced post operative C5 cervical radiculopathy. MMT testing today BUE 4+/5 globally. Pt demos tremors of BUE with active movements. Pt demos impaired cervical ROM, tenderness/tightness of B UT and cervical paraspinals, limitations in functional IR/ER, and postural limitations. Pt was active in group exercises classes pre covid and would like to get back moving and get back into gym activities. Pt would benefit from skilled PT to address the above impairments.    Personal Factors and Comorbidities Past/Current Experience;Time since onset of injury/illness/exacerbation    Examination-Activity Limitations Bend;Carry;Dressing;Lift;Sit;Reach Overhead    Examination-Participation Restrictions Cleaning;Community Activity;Interpersonal Relationship;Laundry    Stability/Clinical Decision Making Stable/Uncomplicated    Clinical Decision Making Low    Rehab Potential Good    PT Frequency 2x / week   pt requests to come 1x/week   PT Duration 8 weeks    PT Treatment/Interventions ADLs/Self Care Home Management;Electrical Stimulation;Iontophoresis 4mg /ml Dexamethasone;Moist Heat;Neuromuscular re-education;Therapeutic exercise;Therapeutic activities;Patient/family education;Manual techniques;Passive range of motion;Taping;Dry needling    PT Next Visit Plan review HEP, intitiate UE strength/flexibility, manual/modalities as indicated    PT Home Exercise Plan UT stretch, levator stretch, corner stretch, scap retractions, cervical retractions    Consulted and Agree with Plan of Care Patient           Patient will benefit from skilled therapeutic intervention in order to improve the following deficits and impairments:  Decreased coordination, Decreased range of motion, Increased muscle spasms, Impaired UE functional use, Decreased activity tolerance, Pain, Hypomobility, Impaired flexibility, Decreased strength, Postural dysfunction  Visit Diagnosis: Cervicalgia  Muscle weakness  (generalized)  Cramp and spasm     Problem List Patient Active Problem List   Diagnosis Date Noted  . Vitamin D deficiency 01/02/2016  . Arthropathy 01/02/2016  . Tremor 01/02/2016  . Primary generalized (osteo)arthritis 01/02/2016  . Cigarette nicotine dependence, uncomplicated 72/04/4708  . Female climacteric state 01/02/2016  . Hyperglycemia 01/02/2016  . Essential (primary) hypertension 01/02/2016  . Elevated white blood cell count 01/02/2016  . 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 Cervicalgia 01/02/2016  . Radiculopathy of cervical region 01/02/2016  . Essential tremor 01/02/2016  . Anxiety disorder 01/02/2016  . Pure hypercholesterolemia 01/02/2016  . Weakness 01/02/2016  . Fatigue 01/02/2016  . HLD (hyperlipidemia) 01/02/2016  . Body mass index (BMI) of 35.0-35.9 in adult 08/28/2014   Amador Cunas, PT, DPT Donald Prose Anila Bojarski 03/14/2020, 11:39 AM  [place], [place], [address] Phone: [phone]   Fax:  [phone]  Name: SATARA VIRELLA MRN: [id number] Date of Birth: 1951/07/10

## 2020-03-14 NOTE — Patient Instructions (Signed)
Access Code: [email or web address] URL: [email or web address] Date: 03/14/2020 Prepared by: Amador Cunas  Exercises Seated Upper Trapezius Stretch - 1 x daily - 7 x weekly - 3 sets - 2 reps - 30 sec hold Seated Levator Scapulae Stretch - 1 x daily - 7 x weekly - 3 sets - 2 reps - 30 sec hold Seated Cervical Retraction - 1 x daily - 7 x weekly - 3 sets - 10 reps - 3 sec hold Seated Scapular Retraction - 1 x daily - 7 x weekly - 3 sets - 10 reps - 3 sec hold Corner Pec Major Stretch - 1 x daily - 7 x weekly - 3 sets - 2 reps - 20 sec hold

## 2020-03-18 DIAGNOSIS — H5211 Myopia, right eye: Secondary | ICD-10-CM | POA: Diagnosis not present

## 2020-03-19 ENCOUNTER — Ambulatory Visit: Payer: Medicare Other | Attending: Neurosurgery | Admitting: Physical Therapy

## 2020-03-19 ENCOUNTER — Encounter: Payer: Self-pay | Admitting: Physical Therapy

## 2020-03-19 ENCOUNTER — Other Ambulatory Visit: Payer: Self-pay

## 2020-03-19 DIAGNOSIS — R252 Cramp and spasm: Secondary | ICD-10-CM

## 2020-03-19 DIAGNOSIS — M542 Cervicalgia: Secondary | ICD-10-CM | POA: Insufficient documentation

## 2020-03-19 DIAGNOSIS — M6281 Muscle weakness (generalized): Secondary | ICD-10-CM | POA: Diagnosis not present

## 2020-03-19 NOTE — Therapy (Signed)
[place], [place], [address] Phone: [phone]   Fax:  [phone]  Physical Therapy Treatment  Patient Details  Name: [name] MRN: [id number] Date of Birth: May 22, 1951 Referring Provider (PT): Leonie Green   Encounter Date: 03/19/2020   PT End of Session - 03/19/20 0842    Visit Number 2    Date for PT Re-Evaluation 05/15/20    PT Start Time 0800    PT Stop Time 0843    PT Time Calculation (min) 43 min    Activity Tolerance Patient tolerated treatment well    Behavior During Therapy [facility] for tasks assessed/performed           Past Medical History:  Diagnosis Date  . Arthritis   . Cervical spinal stenosis   . Complication of anesthesia    "makes me cry for days."  . Diverticulitis   . Family history of anesthesia complication    Sister has nausea and vomitting.  Marland Kitchen Hypercholesteremia   . Hypertension     Past Surgical History:  Procedure Laterality Date  . ABDOMINAL HYSTERECTOMY  1995  . ANTERIOR CERVICAL DECOMP/DISCECTOMY FUSION  12/30/11  . BACK SURGERY  1992   . COLECTOMY  2004   "lost 1/3 of my colon"  . THORACIC FUSION  1992  . TUMOR REMOVAL     small intestine    There were no vitals filed for this visit.   Subjective Assessment - 03/19/20 0804    Subjective Pt reports that her HEP has helped    Currently in Pain? No/denies                             [facility] Adult PT Treatment/Exercise - 03/19/20 0001      Neck Exercises: Machines for Strengthening   Nustep L1.5 x6       Neck Exercises: Theraband   Shoulder Extension Red;20 reps    Rows 20 reps;Red    Shoulder External Rotation 20 reps   yellow      Neck Exercises: Standing   Neck Retraction 10 reps;3 secs    Other Standing Exercises shrugs 2lb 2x10     Other Standing Exercises Shoulder flex 1lb 2x10 ; Abd 2x5       Manual Therapy   Manual Therapy Soft tissue mobilization    Soft  tissue mobilization Posterior cervical para spinales, upper traps, levater      Neck Exercises: Stretches   Levator Stretch Right;Left;3 reps;10 seconds                    PT Short Term Goals - 03/14/20 1114      PT SHORT TERM GOAL #1   Title Pt will be independent with HEP    Time 2    Period Weeks    Target Date 03/28/20             PT Long Term Goals - 03/14/20 1136      PT LONG TERM GOAL #1   Title Pt will demonstrate cervical ROM WFL    Time 8    Period Weeks    Status New    Target Date 05/09/20      PT LONG TERM GOAL #2   Title Pt will report reduction in cervical pain by 50%    Time 8    Period Weeks    Status New  Target Date 05/09/20      PT LONG TERM GOAL #3   Title Pt will demo understanding of upright posture and improved body mechanics    Time 8    Period Weeks    Status New    Target Date 05/09/20      PT LONG TERM GOAL #4   Title Pt will report ability to complete household ADLs without increase in cervical pain    Time 8    Period Weeks    Target Date 05/09/20                 Plan - 03/19/20 0842    Clinical Impression Statement Pt tolerated an initial progression to TE well. She reports compliance and improvement with HEP. No reports of increase pain. Tactile cues for arm placement needed with external rotation. Postural cues needed with standing rows and extensions. Tightness noted in her upper traps. Positive response to MT.    Personal Factors and Comorbidities Past/Current Experience;Time since onset of injury/illness/exacerbation    Examination-Activity Limitations Bend;Carry;Dressing;Lift;Sit;Reach Overhead    Examination-Participation Restrictions Cleaning;Community Activity;Interpersonal Relationship;Laundry    Stability/Clinical Decision Making Stable/Uncomplicated    Rehab Potential Good    PT Frequency 2x / week    PT Duration 8 weeks    PT Treatment/Interventions ADLs/Self Care Home Management;Electrical  Stimulation;Iontophoresis 4mg /ml Dexamethasone;Moist Heat;Neuromuscular re-education;Therapeutic exercise;Therapeutic activities;Patient/family education;Manual techniques;Passive range of motion;Taping;Dry needling    PT Next Visit Plan UE strength/flexibility, manual/modalities as indicated           Patient will benefit from skilled therapeutic intervention in order to improve the following deficits and impairments:  Decreased coordination, Decreased range of motion, Increased muscle spasms, Impaired UE functional use, Decreased activity tolerance, Pain, Hypomobility, Impaired flexibility, Decreased strength, Postural dysfunction  Visit Diagnosis: Cramp and spasm  Muscle weakness (generalized)  Cervicalgia     Problem List Patient Active Problem List   Diagnosis Date Noted  . Vitamin D deficiency 01/02/2016  . Arthropathy 01/02/2016  . Tremor 01/02/2016  . Primary generalized (osteo)arthritis 01/02/2016  . Cigarette nicotine dependence, uncomplicated 25/42/7062  . Female climacteric state 01/02/2016  . Hyperglycemia 01/02/2016  . Essential (primary) hypertension 01/02/2016  . Elevated white blood cell count 01/02/2016  . 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 Cervicalgia 01/02/2016  . Radiculopathy of cervical region 01/02/2016  . Essential tremor 01/02/2016  . Anxiety disorder 01/02/2016  . Pure hypercholesterolemia 01/02/2016  . Weakness 01/02/2016  . Fatigue 01/02/2016  . HLD (hyperlipidemia) 01/02/2016  . Body mass index (BMI) of 35.0-35.9 in adult 08/28/2014    Scot Jun, PTA 03/19/2020, 8:45 AM  [place], [place], [address] Phone: [phone]   Fax:  [phone]  Name: [name] MRN: [id number] Date of Birth: 1950-09-19

## 2020-03-26 ENCOUNTER — Encounter: Payer: Medicare Other | Admitting: Physical Therapy

## 2020-04-02 ENCOUNTER — Other Ambulatory Visit: Payer: Self-pay

## 2020-04-02 ENCOUNTER — Ambulatory Visit: Payer: Medicare Other | Admitting: Physical Therapy

## 2020-04-02 ENCOUNTER — Encounter: Payer: Self-pay | Admitting: Physical Therapy

## 2020-04-02 DIAGNOSIS — M6281 Muscle weakness (generalized): Secondary | ICD-10-CM

## 2020-04-02 DIAGNOSIS — R252 Cramp and spasm: Secondary | ICD-10-CM | POA: Diagnosis not present

## 2020-04-02 DIAGNOSIS — M542 Cervicalgia: Secondary | ICD-10-CM

## 2020-04-02 NOTE — Therapy (Signed)
[place], [place], [address] Phone: [phone]   Fax:  [phone]  Physical Therapy Treatment  Patient Details  Name: [name] MRN: [id number] Date of Birth: [birth year] Referring Provider (PT): Leonie Green   Encounter Date: 04/02/2020   PT End of Session - 04/02/20 1013    Visit Number 3    Date for PT Re-Evaluation 05/15/20    PT Start Time 0930    PT Stop Time 2248    PT Time Calculation (min) 45 min    Activity Tolerance Patient tolerated treatment well    Behavior During Therapy [facility] for tasks assessed/performed           Past Medical History:  Diagnosis Date  . Arthritis   . Cervical spinal stenosis   . Complication of anesthesia    "makes me cry for days."  . Diverticulitis   . Family history of anesthesia complication    Sister has nausea and vomitting.  Marland Kitchen Hypercholesteremia   . Hypertension     Past Surgical History:  Procedure Laterality Date  . ABDOMINAL HYSTERECTOMY  1995  . ANTERIOR CERVICAL DECOMP/DISCECTOMY FUSION  12/30/11  . BACK SURGERY  1992   . COLECTOMY  2004   "lost 1/3 of my colon"  . THORACIC FUSION  1992  . TUMOR REMOVAL     small intestine    There were no vitals filed for this visit.   Subjective Assessment - 04/02/20 0934    Subjective "Im better" Pt reports that normally she is on ice around lunch time, has not ben the case recently    Currently in Pain? No/denies                             [facility] Adult PT Treatment/Exercise - 04/02/20 0001      Neck Exercises: Machines for Strengthening   Nustep L1.0 x2 min each       Neck Exercises: Theraband   Shoulder Extension Red;20 reps    Rows 20 reps;Red    Shoulder External Rotation 20 reps      Neck Exercises: Standing   Neck Retraction 10 reps;3 secs;5 reps      Neck Exercises: Seated   Other Seated Exercise Shrugs 3lb 2x10      Manual Therapy   Manual Therapy  Soft tissue mobilization    Soft tissue mobilization Posterior cervical para spinales, upper traps, levater      Neck Exercises: Stretches   Levator Stretch Right;Left;3 reps;10 seconds                    PT Short Term Goals - 04/02/20 1013      PT SHORT TERM GOAL #1   Title Pt will be independent with HEP    Status Achieved             PT Long Term Goals - 04/02/20 1013      PT LONG TERM GOAL #1   Title Pt will demonstrate cervical ROM WFL    Status On-going      PT LONG TERM GOAL #2   Title Pt will report reduction in cervical pain by 50%    Status Partially Met      PT LONG TERM GOAL #3   Title Pt will demo understanding of upright posture and improved body mechanics    Status Partially Met  PT LONG TERM GOAL #4   Title Pt will report ability to complete household ADLs without increase in cervical pain    Status Partially Met                 Plan - 04/02/20 1014    Clinical Impression Statement Pt reports compliance with HEP and improvement with ADL. She did well overall today's but does need frequent rest due to fatigue. Again tactile cues to both elbows to keep arms to hr side with external rotation. Positive response with STM, evident bu increase tissues elasticity and decrease stiffness form patient.    Personal Factors and Comorbidities Past/Current Experience;Time since onset of injury/illness/exacerbation    Examination-Activity Limitations Bend;Carry;Dressing;Lift;Sit;Reach Overhead    Examination-Participation Restrictions Cleaning;Community Activity;Interpersonal Relationship;Laundry    Stability/Clinical Decision Making Stable/Uncomplicated    Rehab Potential Good    PT Frequency 2x / week    PT Duration 8 weeks    PT Treatment/Interventions ADLs/Self Care Home Management;Electrical Stimulation;Iontophoresis 56m/ml Dexamethasone;Moist Heat;Neuromuscular re-education;Therapeutic exercise;Therapeutic activities;Patient/family  education;Manual techniques;Passive range of motion;Taping;Dry needling    PT Next Visit Plan UE strength/flexibility, manual/modalities as indicated           Patient will benefit from skilled therapeutic intervention in order to improve the following deficits and impairments:  Decreased coordination, Decreased range of motion, Increased muscle spasms, Impaired UE functional use, Decreased activity tolerance, Pain, Hypomobility, Impaired flexibility, Decreased strength, Postural dysfunction  Visit Diagnosis: Muscle weakness (generalized)  Cervicalgia  Cramp and spasm     Problem List Patient Active Problem List   Diagnosis Date Noted  . Vitamin D deficiency 01/02/2016  . Arthropathy 01/02/2016  . Tremor 01/02/2016  . Primary generalized (osteo)arthritis 01/02/2016  . Cigarette nicotine dependence, uncomplicated 033/82/5053 . Female climacteric state 01/02/2016  . Hyperglycemia 01/02/2016  . Essential (primary) hypertension 01/02/2016  . Elevated white blood cell count 01/02/2016  . 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 Cervicalgia 01/02/2016  . Radiculopathy of cervical region 01/02/2016  . Essential tremor 01/02/2016  . Anxiety disorder 01/02/2016  . Pure hypercholesterolemia 01/02/2016  . Weakness 01/02/2016  . Fatigue 01/02/2016  . HLD (hyperlipidemia) 01/02/2016  . Body mass index (BMI) of 35.0-35.9 in adult 08/28/2014    RScot Jun PTA 04/02/2020, 10:16 AM  [place] [address]: [phone]  Fax:  [phone] Name: [name]

## 2020-04-02 NOTE — Patient Instructions (Signed)
Access Code: [email or web address] URL: [email or web address] Date: 04/02/2020 Prepared by: Cheri Fowler  Exercises Standing Row with Resistance - 1 x daily - 7 x weekly - 3 sets - 10 reps Standing Shoulder Extension with Resistance - 1 x daily - 7 x weekly - 3 sets - 10 reps Shoulder External Rotation and Scapular Retraction with Resistance - 1 x daily - 7 x weekly - 3 sets - 10 reps

## 2020-04-09 ENCOUNTER — Other Ambulatory Visit: Payer: Self-pay

## 2020-04-09 ENCOUNTER — Encounter: Payer: Self-pay | Admitting: Physical Therapy

## 2020-04-09 ENCOUNTER — Ambulatory Visit: Payer: Medicare Other | Admitting: Physical Therapy

## 2020-04-09 DIAGNOSIS — R252 Cramp and spasm: Secondary | ICD-10-CM

## 2020-04-09 DIAGNOSIS — M6281 Muscle weakness (generalized): Secondary | ICD-10-CM

## 2020-04-09 DIAGNOSIS — M542 Cervicalgia: Secondary | ICD-10-CM | POA: Diagnosis not present

## 2020-04-09 NOTE — Therapy (Signed)
[address] [place], [place], [address] Phone: [phone]   Fax:  [phone]  Physical Therapy Treatment  Patient Details  Name: [name] MRN: [id number] Date of Birth: [birth year] Referring Provider (PT): Leonie Green   Encounter Date: 04/09/2020   PT End of Session - 04/09/20 1012    Visit Number 4    Date for PT Re-Evaluation 05/15/20    PT Start Time 0930    PT Stop Time 2229    PT Time Calculation (min) 45 min    Activity Tolerance Patient tolerated treatment well;Patient limited by fatigue    Behavior During Therapy [facility] for tasks assessed/performed           Past Medical History:  Diagnosis Date  . Arthritis   . Cervical spinal stenosis   . Complication of anesthesia    "makes me cry for days."  . Diverticulitis   . Family history of anesthesia complication    Sister has nausea and vomitting.  Marland Kitchen Hypercholesteremia   . Hypertension     Past Surgical History:  Procedure Laterality Date  . ABDOMINAL HYSTERECTOMY  1995  . ANTERIOR CERVICAL DECOMP/DISCECTOMY FUSION  12/30/11  . BACK SURGERY  1992   . COLECTOMY  2004   "lost 1/3 of my colon"  . THORACIC FUSION  1992  . TUMOR REMOVAL     small intestine    There were no vitals filed for this visit.   Subjective Assessment - 04/09/20 0933    Subjective Feeling ok    Currently in Pain? No/denies    Pain Location Shoulder    Pain Orientation Left;Right;Upper    Pain Descriptors / Indicators Throbbing;Sore              [facility] PT Assessment - 04/09/20 0001      AROM   Cervical Flexion WFL    Cervical Extension 42    Cervical - Right Side Bend 22    Cervical - Left Side Bend 25    Cervical - Right Rotation 55    Cervical - Left Rotation 50                         [facility] Adult PT Treatment/Exercise - 04/09/20 0001      Neck Exercises: Machines for Strengthening   UBE (Upper Arm Bike) L2 x 3 min each      Nustep L3 x 4 min each     Other Machines for Strengthening Rows 10lb and Lats 15lb 2x10       Neck Exercises: Theraband   Shoulder Extension Red;20 reps      Neck Exercises: Standing   Other Standing Exercises Triceps Ext 15lb 2x10       Manual Therapy   Manual Therapy Soft tissue mobilization    Soft tissue mobilization Posterior cervical para spinales, upper traps, levater                    PT Short Term Goals - 04/02/20 1013      PT SHORT TERM GOAL #1   Title Pt will be independent with HEP    Status Achieved             PT Long Term Goals - 04/09/20 0943      PT LONG TERM GOAL #1   Title Pt will demonstrate cervical ROM WFL    Status Partially Met  Plan - 04/09/20 1012    Clinical Impression Statement Pt has progressed increasing her cervical AROM. Despite the increase she is still limited with side bending. Progressed to some machine level interventions with light resistance. Cues needed with seated rows to prevent trunk sway. Again positive response to MT.    Personal Factors and Comorbidities Past/Current Experience;Time since onset of injury/illness/exacerbation    Examination-Activity Limitations Bend;Carry;Dressing;Lift;Sit;Reach Overhead    Examination-Participation Restrictions Cleaning;Community Activity;Interpersonal Relationship;Laundry    Stability/Clinical Decision Making Stable/Uncomplicated    Rehab Potential Good    PT Frequency 2x / week    PT Duration 8 weeks    PT Treatment/Interventions ADLs/Self Care Home Management;Electrical Stimulation;Iontophoresis 40m/ml Dexamethasone;Moist Heat;Neuromuscular re-education;Therapeutic exercise;Therapeutic activities;Patient/family education;Manual techniques;Passive range of motion;Taping;Dry needling    PT Next Visit Plan UE strength/flexibility, manual/modalities as indicated           Patient will benefit from skilled therapeutic intervention in order to improve the  following deficits and impairments:  Decreased coordination, Decreased range of motion, Increased muscle spasms, Impaired UE functional use, Decreased activity tolerance, Pain, Hypomobility, Impaired flexibility, Decreased strength, Postural dysfunction  Visit Diagnosis: Cramp and spasm  Cervicalgia  Muscle weakness (generalized)     Problem List Patient Active Problem List   Diagnosis Date Noted  . Vitamin D deficiency 01/02/2016  . Arthropathy 01/02/2016  . Tremor 01/02/2016  . Primary generalized (osteo)arthritis 01/02/2016  . Cigarette nicotine dependence, uncomplicated 055/83/1674 . Female climacteric state 01/02/2016  . Hyperglycemia 01/02/2016  . Essential (primary) hypertension 01/02/2016  . Elevated white blood cell count 01/02/2016  . 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 Cervicalgia 01/02/2016  . Radiculopathy of cervical region 01/02/2016  . Essential tremor 01/02/2016  . Anxiety disorder 01/02/2016  . Pure hypercholesterolemia 01/02/2016  . Weakness 01/02/2016  . Fatigue 01/02/2016  . HLD (hyperlipidemia) 01/02/2016  . Body mass index (BMI) of 35.0-35.9 in adult 08/28/2014    RScot Jun PTA 04/09/2020, 10:14 AM  [place] [address]Phone: [phone]  Fax:  [phone] Name: Jennifer JAMERSONMRN: [id number] of Birth: 8April 22, 1952

## 2020-04-22 ENCOUNTER — Other Ambulatory Visit: Payer: Self-pay

## 2020-04-22 ENCOUNTER — Encounter: Payer: Self-pay | Admitting: Physical Therapy

## 2020-04-22 ENCOUNTER — Ambulatory Visit: Payer: Medicare Other | Attending: Neurosurgery | Admitting: Physical Therapy

## 2020-04-22 DIAGNOSIS — R252 Cramp and spasm: Secondary | ICD-10-CM | POA: Diagnosis not present

## 2020-04-22 DIAGNOSIS — M6281 Muscle weakness (generalized): Secondary | ICD-10-CM

## 2020-04-22 DIAGNOSIS — M542 Cervicalgia: Secondary | ICD-10-CM

## 2020-04-22 NOTE — Therapy (Signed)
[address] [place], [place], [address] Phone: [phone]   Fax:  [phone]  Physical Therapy Treatment  Patient Details  Name: [name] MRN: [id number] Date of Birth: 1951-04-22 Referring Provider (PT): Leonie Green   Encounter Date: 04/22/2020   PT End of Session - 04/22/20 1011    Date for PT Re-Evaluation 05/15/20    PT Start Time 0930    PT Stop Time 1014    PT Time Calculation (min) 44 min    Activity Tolerance Patient tolerated treatment well;Patient limited by fatigue    Behavior During Therapy [facility] for tasks assessed/performed           Past Medical History:  Diagnosis Date  . Arthritis   . Cervical spinal stenosis   . Complication of anesthesia    "makes me cry for days."  . Diverticulitis   . Family history of anesthesia complication    Sister has nausea and vomitting.  Marland Kitchen Hypercholesteremia   . Hypertension     Past Surgical History:  Procedure Laterality Date  . ABDOMINAL HYSTERECTOMY  1995  . ANTERIOR CERVICAL DECOMP/DISCECTOMY FUSION  12/30/11  . BACK SURGERY  1992   . COLECTOMY  2004   "lost 1/3 of my colon"  . THORACIC FUSION  1992  . TUMOR REMOVAL     small intestine    There were no vitals filed for this visit.   Subjective Assessment - 04/22/20 0933    Subjective "Doing ok"    Currently in Pain? Yes    Pain Score 3     Pain Location Shoulder    Pain Orientation Left;Right;Upper                             [facility] Adult PT Treatment/Exercise - 04/22/20 0001      Neck Exercises: Machines for Strengthening   UBE (Upper Arm Bike) L2 x 2.5 min each     Nustep L4 x 4 min each     Other Machines for Strengthening Rows 10lb and Lats 15lb 2x10       Neck Exercises: Standing   Other Standing Exercises Shoulder Ext 5lb 2x10, Shruggs 2lb 2x10 Levater stretch 3x5 sec     Other Standing Exercises Triceps Ext 15lb 2x10       Manual Therapy    Manual Therapy Soft tissue mobilization;Passive ROM    Soft tissue mobilization Posterior cervical para spinales, upper traps, levater    Passive ROM ervical spine all directions                    PT Short Term Goals - 04/02/20 1013      PT SHORT TERM GOAL #1   Title Pt will be independent with HEP    Status Achieved             PT Long Term Goals - 04/22/20 0935      PT LONG TERM GOAL #2   Title Pt will report reduction in cervical pain by 50%    Status Partially Met      PT LONG TERM GOAL #3   Title Pt will demo understanding of upright posture and improved body mechanics    Status Partially Met      PT LONG TERM GOAL #4   Title Pt will report ability to complete household ADLs without increase in cervical pain  Status Partially Met                 Plan - 04/22/20 1011    Clinical Impression Statement Pt reports some improvement at ome not needed to lay on a ice pack all day. Good carryover with machine level interventions. Hesitant to increase weight to three pounds with shrugs. Tactile cues to prevent trunk flexion with shoulder extensions. Feels the most relief with MT.    Personal Factors and Comorbidities Past/Current Experience;Time since onset of injury/illness/exacerbation    Examination-Activity Limitations Bend;Carry;Dressing;Lift;Sit;Reach Overhead    Examination-Participation Restrictions Cleaning;Community Activity;Interpersonal Relationship;Laundry    Stability/Clinical Decision Making Stable/Uncomplicated    Rehab Potential Good    PT Frequency 2x / week    PT Duration 8 weeks    PT Treatment/Interventions ADLs/Self Care Home Management;Electrical Stimulation;Iontophoresis 67m/ml Dexamethasone;Moist Heat;Neuromuscular re-education;Therapeutic exercise;Therapeutic activities;Patient/family education;Manual techniques;Passive range of motion;Taping;Dry needling    PT Next Visit Plan UE strength/flexibility, manual/modalities as indicated            Patient will benefit from skilled therapeutic intervention in order to improve the following deficits and impairments:  Decreased coordination, Decreased range of motion, Increased muscle spasms, Impaired UE functional use, Decreased activity tolerance, Pain, Hypomobility, Impaired flexibility, Decreased strength, Postural dysfunction  Visit Diagnosis: Cramp and spasm  Muscle weakness (generalized)  Cervicalgia     Problem List Patient Active Problem List   Diagnosis Date Noted  . Vitamin D deficiency 01/02/2016  . Arthropathy 01/02/2016  . Tremor 01/02/2016  . Primary generalized (osteo)arthritis 01/02/2016  . Cigarette nicotine dependence, uncomplicated 000/45/9977 . Female climacteric state 01/02/2016  . Hyperglycemia 01/02/2016  . Essential (primary) hypertension 01/02/2016  . Elevated white blood cell count 01/02/2016  . 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 Cervicalgia 01/02/2016  . Radiculopathy of cervical region 01/02/2016  . Essential tremor 01/02/2016  . Anxiety disorder 01/02/2016  . Pure hypercholesterolemia 01/02/2016  . Weakness 01/02/2016  . Fatigue 01/02/2016  . HLD (hyperlipidemia) 01/02/2016  . Body mass index (BMI) of 35.0-35.9 in adult 08/28/2014    RScot Jun PTA 04/22/2020, 10:14 AM  [place] [address]Phone: [phone]  Fax:  [phone] Name: Jennifer BROGDONMRN: [id number] of Birth: 8Jun 21, 1952

## 2020-05-06 ENCOUNTER — Encounter: Payer: Self-pay | Admitting: Physical Therapy

## 2020-05-06 ENCOUNTER — Other Ambulatory Visit: Payer: Self-pay

## 2020-05-06 ENCOUNTER — Ambulatory Visit: Payer: Medicare Other | Admitting: Physical Therapy

## 2020-05-06 DIAGNOSIS — M542 Cervicalgia: Secondary | ICD-10-CM | POA: Diagnosis not present

## 2020-05-06 DIAGNOSIS — M6281 Muscle weakness (generalized): Secondary | ICD-10-CM | POA: Diagnosis not present

## 2020-05-06 DIAGNOSIS — R252 Cramp and spasm: Secondary | ICD-10-CM | POA: Diagnosis not present

## 2020-05-06 NOTE — Therapy (Signed)
[facility] [address] [place], [place], [address] Phone: [phone]   Fax:  [phone]  Physical Therapy Treatment  Patient Details  Name: [name] MRN: [id number] Date of Birth: 04/01/1951 Referring Provider (PT): Julie Breen   Encounter Date: 05/06/2020   PT End of Session - 05/06/20 1014    Visit Number 5    Date for PT Re-Evaluation 05/15/20    PT Start Time 0930    PT Stop Time 1015    PT Time Calculation (min) 45 min    Activity Tolerance Patient tolerated treatment well;Patient limited by fatigue    Behavior During Therapy [facility] for tasks assessed/performed           Past Medical History:  Diagnosis Date  . Arthritis   . Cervical spinal stenosis   . Complication of anesthesia    "makes me cry for days."  . Diverticulitis   . Family history of anesthesia complication    Sister has nausea and vomitting.  . Hypercholesteremia   . Hypertension     Past Surgical History:  Procedure Laterality Date  . ABDOMINAL HYSTERECTOMY  1995  . ANTERIOR CERVICAL DECOMP/DISCECTOMY FUSION  12/30/11  . BACK SURGERY  1992   . COLECTOMY  2004   "lost 1/3 of my colon"  . THORACIC FUSION  1992  . TUMOR REMOVAL     small intestine    There were no vitals filed for this visit.   Subjective Assessment - 05/06/20 0936    Subjective The pain in my neck is not a pain it is a throbbing. Get injection on Thursday    Currently in Pain? Yes    Pain Score 3     Pain Location Neck    Pain Descriptors / Indicators Throbbing              [facility] PT Assessment - 05/06/20 0001      AROM   Cervical Extension 35    Cervical - Right Side Bend 30    Cervical - Left Side Bend 27    Cervical - Right Rotation 42    Cervical - Left Rotation 55                         [facility] Adult PT Treatment/Exercise - 05/06/20 0001      Neck Exercises: Machines for Strengthening   UBE (Upper Arm Bike) L2 x 3 min each        Neck Exercises: Standing   Neck Retraction 10 reps;3 secs;5 reps    Other Standing Exercises Shoulder Ext 5lb 2x10, Shruggs 2lb 2x10 Levater stretch 3x5 sec     Other Standing Exercises Triceps Ext 15lb 2x10       Neck Exercises: Seated   Other Seated Exercise Shrugs 2lb 2x10    Other Seated Exercise Red Tband ER 2x10       Manual Therapy   Manual Therapy Soft tissue mobilization;Passive ROM    Soft tissue mobilization Posterior cervical para spinales, upper traps, levater    Passive ROM cervical spine all directions      Neck Exercises: Stretches   Levator Stretch Right;Left;3 reps;10 seconds                    PT Short Term Goals - 04/02/20 1013      PT SHORT TERM GOAL #1   Title Pt will be independent with HEP      Status Achieved             PT Long Term Goals - 05/06/20 3300      PT LONG TERM GOAL #1   Title Pt will demonstrate cervical ROM WFL    Status Partially Met      PT LONG TERM GOAL #2   Title Pt will report reduction in cervical pain by 50%    Status Partially Met      PT LONG TERM GOAL #3   Title Pt will demo understanding of upright posture and improved body mechanics    Status Achieved      PT LONG TERM GOAL #4   Title Pt will report ability to complete household ADLs without increase in cervical pain    Status Partially Met                 Plan - 05/06/20 1014    Clinical Impression Statement Pt reports more of a throbbing sensation in her neck area. She reports that she has not been doing her HEP and did not have reason why. She said she now it will help but she just has not did it. Some regression noted in cervical ROM. Tactile cues to keep arms to side with ER. Postural cues required with shoulder ext, Positive response to MT.    Personal Factors and Comorbidities Past/Current Experience;Time since onset of injury/illness/exacerbation    Examination-Activity Limitations Bend;Carry;Dressing;Lift;Sit;Reach Overhead     Examination-Participation Restrictions Cleaning;Community Activity;Interpersonal Relationship;Laundry    Stability/Clinical Decision Making Stable/Uncomplicated    Rehab Potential Good    PT Frequency 2x / week    PT Duration 8 weeks    PT Treatment/Interventions ADLs/Self Care Home Management;Electrical Stimulation;Iontophoresis 60m/ml Dexamethasone;Moist Heat;Neuromuscular re-education;Therapeutic exercise;Therapeutic activities;Patient/family education;Manual techniques;Passive range of motion;Taping;Dry needling    PT Next Visit Plan UE strength/flexibility, manual/modalities as indicated           Patient will benefit from skilled therapeutic intervention in order to improve the following deficits and impairments:  Decreased coordination, Decreased range of motion, Increased muscle spasms, Impaired UE functional use, Decreased activity tolerance, Pain, Hypomobility, Impaired flexibility, Decreased strength, Postural dysfunction  Visit Diagnosis: Cervicalgia  Cramp and spasm  Muscle weakness (generalized)     Problem List Patient Active Problem List   Diagnosis Date Noted  . Vitamin D deficiency 01/02/2016  . Arthropathy 01/02/2016  . Tremor 01/02/2016  . Primary generalized (osteo)arthritis 01/02/2016  . Cigarette nicotine dependence, uncomplicated 076/22/6333 . Female climacteric state 01/02/2016  . Hyperglycemia 01/02/2016  . Essential (primary) hypertension 01/02/2016  . Elevated white blood cell count 01/02/2016  . 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 Cervicalgia 01/02/2016  . Radiculopathy of cervical region 01/02/2016  . Essential tremor 01/02/2016  . Anxiety disorder 01/02/2016  . Pure hypercholesterolemia 01/02/2016  . Weakness 01/02/2016  . Fatigue 01/02/2016  . HLD (hyperlipidemia) 01/02/2016  . Body mass index (BMI) of 35.0-35.9 in adult 08/28/2014    RScot Jun PTA 05/06/2020, 10:26 AM  [place] [address]Phone: [phone]  Fax:  [phone] Name: Jennifer ALLENDERMRN: [id number] of Birth: 8Apr 28, 1952

## 2020-05-08 DIAGNOSIS — K625 Hemorrhage of anus and rectum: Secondary | ICD-10-CM | POA: Diagnosis not present

## 2020-05-08 DIAGNOSIS — M47812 Spondylosis without myelopathy or radiculopathy, cervical region: Secondary | ICD-10-CM | POA: Diagnosis not present

## 2020-05-13 ENCOUNTER — Ambulatory Visit: Payer: Medicare Other | Admitting: Physical Therapy

## 2020-05-13 ENCOUNTER — Encounter: Payer: Self-pay | Admitting: Physical Therapy

## 2020-05-13 ENCOUNTER — Other Ambulatory Visit: Payer: Self-pay

## 2020-05-13 DIAGNOSIS — M542 Cervicalgia: Secondary | ICD-10-CM

## 2020-05-13 DIAGNOSIS — R252 Cramp and spasm: Secondary | ICD-10-CM | POA: Diagnosis not present

## 2020-05-13 DIAGNOSIS — M6281 Muscle weakness (generalized): Secondary | ICD-10-CM

## 2020-05-13 NOTE — Therapy (Signed)
[address] [place], [place], [address] Phone: [phone]   Fax:  [phone]  Physical Therapy Treatment  Patient Details  Name: [name] MRN: [id number] Date of Birth: 04/06/1951 Referring Provider (PT): Leonie Green   Encounter Date: 05/13/2020   PT End of Session - 05/13/20 1009    Visit Number 6    Date for PT Re-Evaluation 05/15/20    PT Start Time 0930    PT Stop Time 1010    PT Time Calculation (min) 40 min    Activity Tolerance Patient tolerated treatment well;Patient limited by fatigue    Behavior During Therapy [facility] for tasks assessed/performed           Past Medical History:  Diagnosis Date  . Arthritis   . Cervical spinal stenosis   . Complication of anesthesia    "makes me cry for days."  . Diverticulitis   . Family history of anesthesia complication    Sister has nausea and vomitting.  Marland Kitchen Hypercholesteremia   . Hypertension     Past Surgical History:  Procedure Laterality Date  . ABDOMINAL HYSTERECTOMY  1995  . ANTERIOR CERVICAL DECOMP/DISCECTOMY FUSION  12/30/11  . BACK SURGERY  1992   . COLECTOMY  2004   "lost 1/3 of my colon"  . THORACIC FUSION  1992  . TUMOR REMOVAL     small intestine    There were no vitals filed for this visit.   Subjective Assessment - 05/13/20 0933    Subjective Injection last week, feeling better don't have that throbbing pain    Currently in Pain? Yes    Pain Score 2     Pain Location Neck                             [facility] Adult PT Treatment/Exercise - 05/13/20 0001      Neck Exercises: Machines for Strengthening   UBE (Upper Arm Bike) L2 x 2 min each     Nustep L4 x6 min each     Other Machines for Strengthening Rows 15lb and Lats 15lb 2x10       Neck Exercises: Standing   Neck Retraction 10 reps;3 secs;5 reps    Other Standing Exercises Shoulder Ext 5lb 2x10, Shruggs 2lb 2x10 Levater stretch 3x5 sec       Manual  Therapy   Manual Therapy Soft tissue mobilization;Passive ROM    Soft tissue mobilization Posterior cervical para spinales, upper traps, levater    Passive ROM cervical spine all directions                    PT Short Term Goals - 04/02/20 1013      PT SHORT TERM GOAL #1   Title Pt will be independent with HEP    Status Achieved             PT Long Term Goals - 05/13/20 1010      PT LONG TERM GOAL #1   Title Pt will demonstrate cervical ROM WFL      PT LONG TERM GOAL #2   Title Pt will report reduction in cervical pain by 50%    Status Achieved      PT LONG TERM GOAL #3   Title Pt will demo understanding of upright posture and improved body mechanics    Status Achieved      PT LONG TERM GOAL #  4   Title Pt will report ability to complete household ADLs without increase in cervical pain    Status Achieved                 Plan - 05/13/20 1010    Clinical Impression Statement Pt has progressed meeting most goals. She still has some cervical ROM limitation. She is pleased with her current functional status after receiving an injection last wee.    Personal Factors and Comorbidities Past/Current Experience;Time since onset of injury/illness/exacerbation    Examination-Activity Limitations Bend;Carry;Dressing;Lift;Sit;Reach Overhead    Examination-Participation Restrictions Cleaning;Community Activity;Interpersonal Relationship;Laundry    Stability/Clinical Decision Making Stable/Uncomplicated    Rehab Potential Good    PT Frequency 2x / week    PT Duration 8 weeks    PT Treatment/Interventions ADLs/Self Care Home Management;Electrical Stimulation;Iontophoresis 27m/ml Dexamethasone;Moist Heat;Neuromuscular re-education;Therapeutic exercise;Therapeutic activities;Patient/family education;Manual techniques;Passive range of motion;Taping;Dry needling    PT Next Visit Plan D/C PT           Patient will benefit from skilled therapeutic intervention in order  to improve the following deficits and impairments:  Decreased coordination, Decreased range of motion, Increased muscle spasms, Impaired UE functional use, Decreased activity tolerance, Pain, Hypomobility, Impaired flexibility, Decreased strength, Postural dysfunction  Visit Diagnosis: Cramp and spasm  Muscle weakness (generalized)  Cervicalgia     Problem List Patient Active Problem List   Diagnosis Date Noted  . Vitamin D deficiency 01/02/2016  . Arthropathy 01/02/2016  . Tremor 01/02/2016  . Primary generalized (osteo)arthritis 01/02/2016  . Cigarette nicotine dependence, uncomplicated 073/03/5693 . Female climacteric state 01/02/2016  . Hyperglycemia 01/02/2016  . Essential (primary) hypertension 01/02/2016  . Elevated white blood cell count 01/02/2016  . 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 Cervicalgia 01/02/2016  . Radiculopathy of cervical region 01/02/2016  . Essential tremor 01/02/2016  . Anxiety disorder 01/02/2016  . Pure hypercholesterolemia 01/02/2016  . Weakness 01/02/2016  . Fatigue 01/02/2016  . HLD (hyperlipidemia) 01/02/2016  . Body mass index (BMI) of 35.0-35.9 in adult 08/28/2014   PHYSICAL THERAPY DISCHARGE SUMMARY  Visits from Start of Care: 6  Plan: Patient agrees to discharge.  Patient goals were met. Patient is being discharged due to being pleased with the current functional level.  ?????     [name] 05/13/2020, 10:12 AM  [place] [address]Phone: [phone]  Fax:  [phone] Name: Jennifer SEEDORFMRN: [id number] of Birth: [birth year]

## 2020-06-04 ENCOUNTER — Other Ambulatory Visit: Payer: Self-pay

## 2020-06-04 ENCOUNTER — Ambulatory Visit
Admission: RE | Admit: 2020-06-04 | Discharge: 2020-06-04 | Disposition: A | Discharge status: Home / Self Care | Payer: Medicare Other | Source: Ambulatory Visit | Attending: Family Medicine | Admitting: Family Medicine

## 2020-06-04 DIAGNOSIS — Z1231 Encounter for screening mammogram for malignant neoplasm of breast: Secondary | ICD-10-CM | POA: Diagnosis not present

## 2020-06-04 DIAGNOSIS — R251 Tremor, unspecified: Secondary | ICD-10-CM | POA: Diagnosis not present

## 2020-06-04 DIAGNOSIS — M47812 Spondylosis without myelopathy or radiculopathy, cervical region: Secondary | ICD-10-CM | POA: Diagnosis not present

## 2020-06-04 DIAGNOSIS — M542 Cervicalgia: Secondary | ICD-10-CM | POA: Diagnosis not present

## 2020-06-04 DIAGNOSIS — Q761 Klippel-Feil syndrome: Secondary | ICD-10-CM | POA: Diagnosis not present

## 2020-06-05 ENCOUNTER — Ambulatory Visit: Payer: Medicare Other

## 2020-06-09 ENCOUNTER — Other Ambulatory Visit: Payer: Self-pay | Admitting: Family Medicine

## 2020-06-09 DIAGNOSIS — R928 Other abnormal and inconclusive findings on diagnostic imaging of breast: Secondary | ICD-10-CM

## 2020-06-17 DIAGNOSIS — I10 Essential (primary) hypertension: Secondary | ICD-10-CM | POA: Diagnosis not present

## 2020-06-17 DIAGNOSIS — D72829 Elevated white blood cell count, unspecified: Secondary | ICD-10-CM | POA: Diagnosis not present

## 2020-06-17 DIAGNOSIS — E1169 Type 2 diabetes mellitus with other specified complication: Secondary | ICD-10-CM | POA: Diagnosis not present

## 2020-06-17 DIAGNOSIS — Z0000 Encounter for general adult medical examination without abnormal findings: Secondary | ICD-10-CM | POA: Diagnosis not present

## 2020-06-23 ENCOUNTER — Other Ambulatory Visit: Payer: Self-pay

## 2020-06-23 ENCOUNTER — Ambulatory Visit
Admission: RE | Admit: 2020-06-23 | Discharge: 2020-06-23 | Disposition: A | Discharge status: Home / Self Care | Payer: Medicare Other | Source: Ambulatory Visit | Attending: Family Medicine | Admitting: Family Medicine

## 2020-06-23 DIAGNOSIS — R928 Other abnormal and inconclusive findings on diagnostic imaging of breast: Secondary | ICD-10-CM

## 2020-06-23 DIAGNOSIS — N6012 Diffuse cystic mastopathy of left breast: Secondary | ICD-10-CM | POA: Diagnosis not present

## 2020-07-15 DIAGNOSIS — M47812 Spondylosis without myelopathy or radiculopathy, cervical region: Secondary | ICD-10-CM | POA: Diagnosis not present

## 2020-07-31 DIAGNOSIS — H6123 Impacted cerumen, bilateral: Secondary | ICD-10-CM | POA: Diagnosis not present

## 2020-08-12 DIAGNOSIS — M47812 Spondylosis without myelopathy or radiculopathy, cervical region: Secondary | ICD-10-CM | POA: Diagnosis not present

## 2020-08-12 DIAGNOSIS — Q761 Klippel-Feil syndrome: Secondary | ICD-10-CM | POA: Diagnosis not present

## 2020-10-02 DIAGNOSIS — M47812 Spondylosis without myelopathy or radiculopathy, cervical region: Secondary | ICD-10-CM | POA: Diagnosis not present

## 2020-10-27 DIAGNOSIS — M542 Cervicalgia: Secondary | ICD-10-CM | POA: Diagnosis not present

## 2020-10-27 DIAGNOSIS — M47812 Spondylosis without myelopathy or radiculopathy, cervical region: Secondary | ICD-10-CM | POA: Diagnosis not present

## 2020-10-27 DIAGNOSIS — Q761 Klippel-Feil syndrome: Secondary | ICD-10-CM | POA: Diagnosis not present

## 2020-12-11 DIAGNOSIS — M47812 Spondylosis without myelopathy or radiculopathy, cervical region: Secondary | ICD-10-CM | POA: Diagnosis not present

## 2020-12-15 DIAGNOSIS — I10 Essential (primary) hypertension: Secondary | ICD-10-CM | POA: Diagnosis not present

## 2020-12-15 DIAGNOSIS — E559 Vitamin D deficiency, unspecified: Secondary | ICD-10-CM | POA: Diagnosis not present

## 2020-12-15 DIAGNOSIS — E7800 Pure hypercholesterolemia, unspecified: Secondary | ICD-10-CM | POA: Diagnosis not present

## 2020-12-15 DIAGNOSIS — Z1211 Encounter for screening for malignant neoplasm of colon: Secondary | ICD-10-CM | POA: Diagnosis not present

## 2020-12-15 DIAGNOSIS — E1169 Type 2 diabetes mellitus with other specified complication: Secondary | ICD-10-CM | POA: Diagnosis not present

## 2020-12-17 DIAGNOSIS — Z1211 Encounter for screening for malignant neoplasm of colon: Secondary | ICD-10-CM | POA: Diagnosis not present

## 2020-12-23 DIAGNOSIS — M47812 Spondylosis without myelopathy or radiculopathy, cervical region: Secondary | ICD-10-CM | POA: Diagnosis not present

## 2020-12-23 DIAGNOSIS — Q761 Klippel-Feil syndrome: Secondary | ICD-10-CM | POA: Diagnosis not present

## 2021-01-06 DIAGNOSIS — K5730 Diverticulosis of large intestine without perforation or abscess without bleeding: Secondary | ICD-10-CM | POA: Diagnosis not present

## 2021-01-06 DIAGNOSIS — K5733 Diverticulitis of large intestine without perforation or abscess with bleeding: Secondary | ICD-10-CM | POA: Diagnosis not present

## 2021-01-06 DIAGNOSIS — K625 Hemorrhage of anus and rectum: Secondary | ICD-10-CM | POA: Diagnosis not present

## 2021-01-06 DIAGNOSIS — E669 Obesity, unspecified: Secondary | ICD-10-CM | POA: Diagnosis not present

## 2021-01-07 ENCOUNTER — Other Ambulatory Visit: Payer: Self-pay | Admitting: Gastroenterology

## 2021-03-11 ENCOUNTER — Ambulatory Visit: Payer: Medicare Other | Admitting: Allergy

## 2021-03-11 ENCOUNTER — Encounter: Payer: Self-pay | Admitting: Allergy

## 2021-03-11 ENCOUNTER — Other Ambulatory Visit: Payer: Self-pay

## 2021-03-11 VITALS — BP 150/90 | HR 80 | Temp 97.3°F | Resp 18 | Ht 65.0 in | Wt 196.6 lb

## 2021-03-11 DIAGNOSIS — R5383 Other fatigue: Secondary | ICD-10-CM | POA: Diagnosis not present

## 2021-03-11 DIAGNOSIS — Z77120 Contact with and (suspected) exposure to mold (toxic): Secondary | ICD-10-CM | POA: Diagnosis not present

## 2021-03-11 DIAGNOSIS — L659 Nonscarring hair loss, unspecified: Secondary | ICD-10-CM | POA: Diagnosis not present

## 2021-03-11 MED ORDER — METHYLPREDNISOLONE ACETATE 80 MG/ML IJ SUSP
80.0000 mg | Freq: Once | INTRAMUSCULAR | Status: AC
  Administered 2021-03-11: 80 mg via INTRAMUSCULAR

## 2021-03-11 NOTE — Progress Notes (Signed)
Follow-up Note  RE: [name] MRN: [id number] DOB: April 13, 1951 Date of Office Visit: 03/11/2021   History of present illness: [name] is a 70 y.o. female presenting today for mold exposure.  She was last seen in the office on 07/25/2018 by Dr. Ernst Bowler for similar issue.    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  She currently has mold in the place where she lives.  She had mold in the home in 2019 and states she was so sick then.  She now doesn't think it was ever remediated well.  However she states she was feeling better until this year.  She states she has lost her fingernails and feels her "thinking is scrambled" and she also reports her hair is falling out.  She also states she is weak and fatigued.  She also states her cat's nails have also falling out she feels related to mold.  She states she has been feeling sick again for about a month.  She knows the mold is in her body.   She has called a mold removal company and states they came out initially but has not been back to repair anything.  She states she can't get them on the phone either.  She is worried, scared and desperate to have this mold removed.   She states she tried to move out of the home but could not afford to move any place else.  She has friends she can go stay with during the daytime but has to go back home at night.    She denies any cough, wheeze, chest tightness, shortness of breath.  Also denies headaches, itchy/watery eyes, sneezing, nasal congestion/drainage.  Denies itching but states she has a rash.  No fevers.  She states she does sweat.    Review of systems: Review of Systems  Constitutional:  Positive for diaphoresis and malaise/fatigue. Negative for chills and fever.  HENT: Negative.    Eyes: Negative.   Respiratory: Negative.    Cardiovascular: Negative.   Gastrointestinal: Negative.   Musculoskeletal: Negative.   Skin:  Positive for rash. Negative for itching.  Neurological:   Positive for weakness.   All other systems negative unless noted above in HPI  Past medical/social/surgical/family history have been reviewed and are unchanged unless specifically indicated below.  No changes  Medication List: Current Outpatient Medications  Medication Sig Dispense Refill   ALPRAZolam (XANAX) 0.5 MG tablet Take 0.25 tablets by mouth every 6 (six) hours as needed for anxiety or sleep. anxiety  0   Ascorbic Acid (VITAMIN C PO) Take 1 tablet by mouth daily.     Cholecalciferol (VITAMIN D) 50 MCG (2000 UT) tablet Take 2,000 Units by mouth daily.      fexofenadine (ALLEGRA) 180 MG tablet Take 180 mg by mouth daily.     losartan (COZAAR) 25 MG tablet Take 25 mg by mouth daily.   1   Multiple Vitamins-Iron (MULTI-VITAMIN/IRON PO) Take 1 tablet by mouth daily.      acetaminophen (TYLENOL) 500 MG tablet Take 500 mg by mouth every 6 (six) hours as needed for mild pain, moderate pain, fever or headache. (Patient not taking: Reported on 03/11/2021)     betamethasone dipropionate 0.05 % lotion Apply 1 application topically 2 (two) times daily as needed (scalp). (Patient not taking: Reported on 03/11/2021)     HYDROcodone-acetaminophen (NORCO) 5-325 MG tablet Take 1-2 tablets by mouth every 6 (six) hours as needed. (Patient not taking: Reported on 12/03/2019)  15 tablet 0   metroNIDAZOLE (FLAGYL) 250 MG tablet Take 250 mg by mouth 3 (three) times daily. 30 day supply     No current facility-administered medications for this visit.     Known medication allergies: Allergies  Allergen Reactions   Lyrica [Pregabalin] Other (See Comments)    Made symptoms worse   Simvastatin Other (See Comments)    Myalgias   Statins Other (See Comments)    Myalgias   Meloxicam Other (See Comments)    Makes her sweat and muscle cramps      Physical examination: Blood pressure (!) 150/90, pulse 80, temperature (!) 97.3 F (36.3 C), temperature source Temporal, resp. rate 18, height '5\' 5"'$  (1.651 m),  weight 196 lb 9.6 oz (89.2 kg), SpO2 97 %.  General: Alert, interactive, in no acute distress. HEENT: PERRLA, TMs pearly gray, turbinates non-edematous without discharge, post-pharynx non erythematous. Neck: Supple without lymphadenopathy. Lungs: Clear to auscultation without wheezing, rhonchi or rales. {no increased work of breathing. CV: Normal S1, S2 without murmurs. Abdomen: Nondistended, nontender. Skin: Ecchymotic lesions on bilateral forearms and lower extremities bilaterally . Extremities:  No clubbing, cyanosis or edema. Neuro:   Grossly intact.  Diagnositics/Labs: None today  Component     Latest Ref Rng & Units 05/04/2018  IgE (Immunoglobulin E), Serum     6 - 495 IU/mL 52  D Pteronyssinus IgE     Class 0/I kU/L 0.26 (A)  D Farinae IgE     Class 0/I kU/L 0.14 (A)  Cat Dander IgE     Class 0 kU/L <0.10  Dog Dander IgE     Class 0 kU/L <0.10  [place] Grass IgE     Class 0 kU/L <0.10  Timothy Grass IgE     Class 0 kU/L <0.10  Johnson Grass IgE     Class 0 kU/L <0.10  Bahia Grass IgE     Class 0 kU/L <0.10  Cockroach, American IgE     Class 0 kU/L <0.10  Penicillium Chrysogen IgE     Class 0 kU/L <0.10  Cladosporium Herbarum IgE     Class 0 kU/L <0.10  Aspergillus Fumigatus IgE     Class 0 kU/L <0.10  Mucor Racemosus IgE     Class 0 kU/L <0.10  Alternaria Alternata IgE     Class 0 kU/L <0.10  Stemphylium Herbarum IgE     Class 0 kU/L <0.10  Common Silver Wendee Copp IgE     Class 0 kU/L <0.10  Oak, White IgE     Class 0 kU/L <0.10  Elm, American IgE     Class 0 kU/L <0.10  Maple/Box Elder IgE     Class 0 kU/L <0.10  Hickory, White IgE     Class 0 kU/L <0.10  Amer Sycamore IgE Qn     Class 0 kU/L <0.10  White Mulberry IgE     Class 0 kU/L <0.10  Sweet gum IgE RAST Ql     Class 0 kU/L <0.10  Cedar, [place] IgE     Class 0 kU/L <0.10  Ragweed, Short IgE     Class 0 kU/L <0.10  Mugwort IgE Qn     Class 0 kU/L <0.10  Plantain, English IgE     Class  0 kU/L <0.10  Pigweed, Rough IgE     Class 0 kU/L <0.10  Sheep Sorrel IgE Qn     Class 0 kU/L <0.10  Nettle IgE     Class 0 kU/L <0.10  Candida Albicans IgE     Class III kU/L 2.38 (A)  Setomelanomma Rostrat     Class 0 kU/L <0.10  M009-IgE Fusarium proliferatum     Class 0 kU/L <0.10  Aureobasidi Pullulans IgE     Class 0 kU/L <0.10  Phoma Betae IgE     Class 0 kU/L <0.10  M014-IgE Epicoccum purpur     Class 0 kU/L <0.10    Assessment and plan: Mold exposure - perceived symptoms related to mold exposure in home - you were given a DepoMedrol (steroid) injections today to help with inflammation in the body - if your symptoms are worsening or you develop fevers then go to urgent care or emergency department for evaluation - You have a low level of allergy to dust mites and one mold which is common to all people (Candida albicans).  -you might benefit from going to an integrative health provider such as [facility] ([email or web address]). - recommend checking with homeowners insurance (if you have this) in regards to mold removal companies they recommend.   - provided with 3 mold removal companies she can look into  I appreciate the opportunity to take part in [place] care. Please do not hesitate to contact me with questions.  Sincerely,   Prudy Feeler, MD Allergy/Immunology [facility]

## 2021-03-11 NOTE — Patient Instructions (Signed)
Mold exposure - symptoms related to mold exposure in home - you were given a DepoMedrol (steroid) injections today to help with inflammation in the body - if your symptoms are worsening or you develop fevers then go to urgent care or emergency department for evaluation - You have a low level of allergy to dust mites and one mold which is common to all people (Candida albicans).  -you might benefit from going to an integrative health provider such as [facility] ([email or web address]). - recommend checking with homeowners insurance (if you have this) in regards to mold removal companies they recommend.   - you can also check into these 3 mold removal companies:   --------------------------------------------------------------------------------------------------------------  -------------------------------------------------------------------------------------------------------------

## 2021-03-17 NOTE — Progress Notes (Deleted)
   [address] [place] [address] Dept: [phone]  FOLLOW UP NOTE  Patient ID: [name], female    DOB: 08/05/1951  Age: 70 y.o. MRN: [id number] Date of Office Visit: 03/18/2021  Assessment  Chief Complaint: No chief complaint on file.  HPI Johny Shears    Drug Allergies:  Allergies  Allergen Reactions   Lyrica [Pregabalin] Other (See Comments)    Made symptoms worse   Simvastatin Other (See Comments)    Myalgias   Statins Other (See Comments)    Myalgias   Meloxicam Other (See Comments)    Makes her sweat and muscle cramps     Physical Exam: There were no vitals taken for this visit.   Physical Exam  Diagnostics:    Assessment and Plan: No diagnosis found.  No orders of the defined types were placed in this encounter.   There are no Patient Instructions on file for this visit.  No follow-ups on file.    Thank you for the opportunity to care for this patient.  Please do not hesitate to contact me with questions.  Gareth Morgan, FNP [facility]

## 2021-03-18 ENCOUNTER — Ambulatory Visit: Payer: Medicare Other | Admitting: Family Medicine

## 2021-03-20 ENCOUNTER — Encounter (HOSPITAL_COMMUNITY): Admission: RE | Payer: Self-pay | Source: Home / Self Care

## 2021-03-20 ENCOUNTER — Ambulatory Visit (HOSPITAL_COMMUNITY): Admission: RE | Admit: 2021-03-20 | Payer: Medicare Other | Source: Home / Self Care | Admitting: Gastroenterology

## 2021-03-20 SURGERY — COLONOSCOPY WITH PROPOFOL
Anesthesia: Monitor Anesthesia Care

## 2021-03-23 ENCOUNTER — Telehealth: Payer: Self-pay | Admitting: Allergy

## 2021-03-23 NOTE — Telephone Encounter (Signed)
Patient called and states she is urgently needing an environmental allergy test because of the mold exposure in her home. Patient is scheduled for an allergy test on 9/8 with Chrissie, because that is the soonest that we had at the time. Patient is stating that the environmental people came out to her house and made her evacuate and said that Dr. Nelva Bush is the only one that can save her, because this is seriously draining/killing her. Patient is having symptoms of sweating, weakness and nausea. Patient wants to know if she can be doubled book with Dr. Nelva Bush to get her in sooner.   Patient is suppose to be emailing me what the environmentalist sent her.

## 2021-03-23 NOTE — Telephone Encounter (Signed)
Please advise to double booking for testing

## 2021-03-24 NOTE — Telephone Encounter (Signed)
Pt informed of this and will call dr griffin right now to make an appointment with him to be seen about this

## 2021-03-24 NOTE — Telephone Encounter (Signed)
Lm for pt to call us back about this °

## 2021-03-24 NOTE — Telephone Encounter (Signed)
Patient called back and said she left out her worst symptoms, bleeding colon and uncontrollable hand tremors.

## 2021-03-24 NOTE — Telephone Encounter (Signed)
Patient sent an email with what the environmentalist sent her. Forwarded this email to  directly to Dr. Nelva Bush to review.

## 2021-03-26 ENCOUNTER — Ambulatory Visit: Payer: Medicare Other | Admitting: Allergy

## 2021-04-08 DIAGNOSIS — M47812 Spondylosis without myelopathy or radiculopathy, cervical region: Secondary | ICD-10-CM | POA: Diagnosis not present

## 2021-04-16 ENCOUNTER — Encounter: Payer: Self-pay | Admitting: Family Medicine

## 2021-04-16 ENCOUNTER — Ambulatory Visit: Payer: Medicare Other | Admitting: Family Medicine

## 2021-04-16 ENCOUNTER — Other Ambulatory Visit: Payer: Self-pay

## 2021-04-16 VITALS — BP 138/82 | HR 84 | Temp 98.0°F | Resp 20 | Ht 65.0 in | Wt 196.0 lb

## 2021-04-16 DIAGNOSIS — J3089 Other allergic rhinitis: Secondary | ICD-10-CM | POA: Diagnosis not present

## 2021-04-16 DIAGNOSIS — R21 Rash and other nonspecific skin eruption: Secondary | ICD-10-CM

## 2021-04-16 DIAGNOSIS — Z8719 Personal history of other diseases of the digestive system: Secondary | ICD-10-CM | POA: Diagnosis not present

## 2021-04-16 DIAGNOSIS — Z77120 Contact with and (suspected) exposure to mold (toxic): Secondary | ICD-10-CM

## 2021-04-16 DIAGNOSIS — J309 Allergic rhinitis, unspecified: Secondary | ICD-10-CM | POA: Insufficient documentation

## 2021-04-16 DIAGNOSIS — L659 Nonscarring hair loss, unspecified: Secondary | ICD-10-CM

## 2021-04-16 DIAGNOSIS — R5383 Other fatigue: Secondary | ICD-10-CM

## 2021-04-16 NOTE — Progress Notes (Signed)
[address] [place] [address] Dept: [phone]  FOLLOW UP NOTE  Patient ID: [name], female    DOB: 10-01-1950  Age: 70 y.o. MRN: [id number] Date of Office Visit: 04/16/2021  Assessment  Chief Complaint: Rash (Rash on arms, started in April or May, In June it was worse that ever, felt really weak and nauseated, has been staying with her sister)  HPI JALEESHA BUCKEL is a 70 year old female who presents to the clinic for a follow-up visit.  She originally presented to this clinic on 05/04/2018 and was evaluated by Dr.Gallagher for symptoms including fatigue and rash on her arms that resembled bruising. At that time, blood work indicated allergy to dust mite and Candida.  She was last seen in this clinic on 03/11/2021 by Dr. Nelva Bush for evaluation of mold exposure. At that time, she was given a Depo-Medrol injection to help with the inflammation.  A review of her chart indicates a phone call from the patient on 04/02/2021 requesting that her skin testing appointment be expedited and due to an environmental company finding mold in her home.  At that time, she reported symptoms including sweating, nausea, GI bleeding, and uncontrollable hand tremors.  At today's visit, she reports that her overall symptoms have not improved since her last visit to this clinic.  She reports in May 2022 she began to experience faint discoloration on both forearms and in one spot on her leg, hair loss, nausea, and weakness.  She reports that she left her house in early August and is staying with her sister and since then her symptoms have been slowly improving.  She denies symptoms of asthma including shortness of breath, cough, or wheeze with activity or rest and is not currently using an inhaler.  She denies symptoms of allergic rhinitis including rhinorrhea, nasal congestion, sneezing, or postnasal drainage.  She does occasionally take cetirizine which she reports is "not helping anything".  She does have 1  cat at home that she has had for the last several years.  She has several ecchymotic looking areas on her right forearm.  She reports these areas are slowly beginning to resolve over the last month.  She reports these areas are not pruritic. She reports these areas come and go without any open areas or drainage. She reports that she has recently been to her primary care provider, Dr. Laurann Montana, for a physical including lab work.  She follows Dr. Allyson Sabal, dermatology specialist, for a rash on her forehead that appeared a few years ago and has since resolved. She feels that the rash may be related to the mold in her home. She follows Dr. Collene Mares, GI specialist, for ongoing evaluation and treatment of complications due to diverticulitis.  She reports that she has not had a colonoscopy for the last several years, on the recommendation of Dr. Collene Mares, due to intermittent GI bleeding.  She mentions the last GI bleed in February 2022.  She is up-to-date on screening mammogram. Her current medications are listed in the chart.    Drug Allergies:  Allergies  Allergen Reactions   Lyrica [Pregabalin] Other (See Comments)    Made symptoms worse   Simvastatin Other (See Comments)    Myalgias   Statins Other (See Comments)    Myalgias   Meloxicam Other (See Comments)    Makes her sweat and muscle cramps     Physical Exam: BP 138/82 (BP Location: Right Arm, Patient Position: Sitting, Cuff Size: Large)   Pulse  84   Temp 98 F (36.7 C) (Temporal)   Resp 20   Ht '5\' 5"'$  (1.651 m)   Wt 196 lb (88.9 kg)   SpO2 97%   BMI 32.62 kg/m    Physical Exam Vitals reviewed.  Constitutional:      Appearance: Normal appearance.  HENT:     Head: Normocephalic and atraumatic.     Right Ear: Tympanic membrane normal.     Left Ear: Tympanic membrane normal.     Nose:     Comments: Bilateral nares normal.  Pharynx normal.  Ears normal.  Eyes normal.    Mouth/Throat:     Pharynx: Oropharynx is clear.  Eyes:      Conjunctiva/sclera: Conjunctivae normal.  Cardiovascular:     Rate and Rhythm: Normal rate and regular rhythm.     Heart sounds: Normal heart sounds. No murmur heard. Pulmonary:     Effort: Pulmonary effort is normal.     Breath sounds: Normal breath sounds.     Comments: Lungs clear to auscultation Musculoskeletal:        General: Normal range of motion.     Cervical back: Normal range of motion and neck supple.  Skin:    General: Skin is warm and dry.     Comments: Ecchymotic areas mostly concentrated on bilateral forearms.  No open areas or drainage noted.  Neurological:     Mental Status: She is alert and oriented to person, place, and time.  Psychiatric:        Mood and Affect: Mood normal.        Behavior: Behavior normal.        Thought Content: Thought content normal.        Judgment: Judgment normal.    Diagnostics: Percutaneous environmental skin testing was borderline positive to dust mite with adequate controls  Intradermal testing was negative with adequate controls.   Assessment and Plan: 1. Non-seasonal allergic rhinitis due to other allergic trigger   2. Rash   3. H/O: GI bleed   4. Mold exposure   5. Hair loss   6. Other fatigue      Patient Instructions  Allergic rhinitis Your skin testing was positive to dust mites.  Allergen avoidance measures are listed below Continue cetirizine 10 mg once a day as needed for a runny nose or itch Consider saline nasal rinses as needed for nasal symptoms. Use this before any medicated nasal sprays for best result Your allergy testing results from today's skin testing will be sent to [facility] as you have requested. Continue with mold remediation in your home  Rash/hair loss The rash on your arms does not look like an allergic reaction. We have requested records from your primary care provider to help [place] better evaluate your rash If your symptoms re-occur, begin a journal of events that occurred  for up to 6 hours before your symptoms began including foods and beverages consumed, soaps or perfumes you had contact with, and medications.  You may benefit from going to an integrative health provider such as [facility]  Nausea and GI bleeding Follow up with your gastroenterologist and your primary care provider  Call the clinic if this treatment plan is not working well for you  Follow up in 1 year or sooner if needed.   Return in about 1 year (around 04/16/2022), or if symptoms worsen or fail to improve.    Thank you for the opportunity to care for this patient.  Please do not hesitate to contact me with questions.  Gareth Morgan, FNP [facility]

## 2021-04-16 NOTE — Patient Instructions (Addendum)
Allergic rhinitis Your skin testing was positive to dust mites.  Allergen avoidance measures are listed below Continue cetirizine 10 mg once a day as needed for a runny nose or itch Consider saline nasal rinses as needed for nasal symptoms. Use this before any medicated nasal sprays for best result Your allergy testing results from today's skin testing will be sent to [facility] as you have requested. Continue with mold remediation in your home  Rash/hair loss The rash on your arms does not look like an allergic reaction. We have requested records from your primary care provider to help [place] better evaluate your rash If your symptoms re-occur, begin a journal of events that occurred for up to 6 hours before your symptoms began including foods and beverages consumed, soaps or perfumes you had contact with, and medications.  You may benefit from going to an integrative health provider such as [facility]  Nausea and GI bleeding Follow up with your gastroenterologist and your primary care provider  Call the clinic if this treatment plan is not working well for you  Follow up in 1 year or sooner if needed.   Control of Dust Mite Allergen Dust mites play a major role in allergic asthma and rhinitis. They occur in environments with high humidity wherever human skin is found. Dust mites absorb humidity from the atmosphere (ie, they do not drink) and feed on organic matter (including shed human and animal skin). Dust mites are a microscopic type of insect that you cannot see with the naked eye. High levels of dust mites have been detected from mattresses, pillows, carpets, upholstered furniture, bed covers, clothes, soft toys and any woven material. The principal allergen of the dust mite is found in its feces. A gram of dust may contain 1,000 mites and 250,000 fecal particles. Mite antigen is easily measured in the air during house cleaning activities. Dust mites do  not bite and do not cause harm to humans, other than by triggering allergies/asthma.  Ways to decrease your exposure to dust mites in your home:  1. Encase mattresses, box springs and pillows with a mite-impermeable barrier or cover  2. Wash sheets, blankets and drapes weekly in hot water (130 F) with detergent and dry them in a dryer on the hot setting.  3. Have the room cleaned frequently with a vacuum cleaner and a damp dust-mop. For carpeting or rugs, vacuuming with a vacuum cleaner equipped with a high-efficiency particulate air (HEPA) filter. The dust mite allergic individual should not be in a room which is being cleaned and should wait 1 hour after cleaning before going into the room.  4. Do not sleep on upholstered furniture (eg, couches).  5. If possible removing carpeting, upholstered furniture and drapery from the home is ideal. Horizontal blinds should be eliminated in the rooms where the person spends the most time (bedroom, study, television room). Washable vinyl, roller-type shades are optimal.  6. Remove all non-washable stuffed toys from the bedroom. Wash stuffed toys weekly like sheets and blankets above.  7. Reduce indoor humidity to less than 50%. Inexpensive humidity monitors can be purchased at most hardware stores. Do not use a humidifier as can make the problem worse and are not recommended.

## 2021-04-17 ENCOUNTER — Telehealth: Payer: Self-pay

## 2021-04-17 NOTE — Telephone Encounter (Signed)
-----   Message from Dara Hoyer, FNP sent at 04/17/2021  4:36 PM EDT ----- Can you please call this patient and let her know that, unfortunately, we are not allowed to mail the after visit summary to [employer]. We can send an extra copy to the patient and she can sent it to them though. Thank you

## 2021-04-17 NOTE — Telephone Encounter (Signed)
Left a message for patient to call the office, we will need to inform her of Anne's note.

## 2021-04-23 ENCOUNTER — Ambulatory Visit: Payer: Medicare Other | Admitting: Family

## 2021-04-23 DIAGNOSIS — I10 Essential (primary) hypertension: Secondary | ICD-10-CM | POA: Diagnosis not present

## 2021-04-23 DIAGNOSIS — D692 Other nonthrombocytopenic purpura: Secondary | ICD-10-CM | POA: Diagnosis not present

## 2021-04-23 DIAGNOSIS — G250 Essential tremor: Secondary | ICD-10-CM | POA: Diagnosis not present

## 2021-04-23 DIAGNOSIS — E1169 Type 2 diabetes mellitus with other specified complication: Secondary | ICD-10-CM | POA: Diagnosis not present

## 2021-04-23 NOTE — Telephone Encounter (Signed)
Thank you :)

## 2021-04-30 DIAGNOSIS — Q761 Klippel-Feil syndrome: Secondary | ICD-10-CM | POA: Diagnosis not present

## 2021-04-30 DIAGNOSIS — I10 Essential (primary) hypertension: Secondary | ICD-10-CM | POA: Diagnosis not present

## 2021-04-30 DIAGNOSIS — M47812 Spondylosis without myelopathy or radiculopathy, cervical region: Secondary | ICD-10-CM | POA: Diagnosis not present

## 2021-04-30 DIAGNOSIS — M7918 Myalgia, other site: Secondary | ICD-10-CM | POA: Diagnosis not present

## 2021-06-05 DIAGNOSIS — D692 Other nonthrombocytopenic purpura: Secondary | ICD-10-CM | POA: Diagnosis not present

## 2021-06-24 ENCOUNTER — Encounter (HOSPITAL_COMMUNITY): Payer: Self-pay | Admitting: Gastroenterology

## 2021-07-03 ENCOUNTER — Ambulatory Visit (HOSPITAL_COMMUNITY): Admit: 2021-07-03 | Payer: Medicare Other | Admitting: Gastroenterology

## 2021-07-03 ENCOUNTER — Encounter (HOSPITAL_COMMUNITY): Payer: Self-pay

## 2021-07-03 SURGERY — COLONOSCOPY WITH PROPOFOL
Anesthesia: Monitor Anesthesia Care

## 2021-07-21 ENCOUNTER — Other Ambulatory Visit: Payer: Self-pay | Admitting: Gastroenterology

## 2021-08-06 DIAGNOSIS — E1169 Type 2 diabetes mellitus with other specified complication: Secondary | ICD-10-CM | POA: Diagnosis not present

## 2021-08-06 DIAGNOSIS — I700 Atherosclerosis of aorta: Secondary | ICD-10-CM | POA: Diagnosis not present

## 2021-08-06 DIAGNOSIS — Z122 Encounter for screening for malignant neoplasm of respiratory organs: Secondary | ICD-10-CM | POA: Diagnosis not present

## 2021-08-06 DIAGNOSIS — I10 Essential (primary) hypertension: Secondary | ICD-10-CM | POA: Diagnosis not present

## 2021-08-06 DIAGNOSIS — Z0000 Encounter for general adult medical examination without abnormal findings: Secondary | ICD-10-CM | POA: Diagnosis not present

## 2021-08-06 DIAGNOSIS — M47812 Spondylosis without myelopathy or radiculopathy, cervical region: Secondary | ICD-10-CM | POA: Diagnosis not present

## 2021-08-06 DIAGNOSIS — E7800 Pure hypercholesterolemia, unspecified: Secondary | ICD-10-CM | POA: Diagnosis not present

## 2021-08-19 ENCOUNTER — Encounter (HOSPITAL_COMMUNITY): Payer: Self-pay | Admitting: Gastroenterology

## 2021-08-27 NOTE — Anesthesia Preprocedure Evaluation (Addendum)
Anesthesia Evaluation  Patient identified by MRN, date of birth, ID band Patient awake    Reviewed: Allergy & Precautions, NPO status , Patient's Chart, lab work & pertinent test results  History of Anesthesia Complications Negative for: history of anesthetic complications  Airway Mallampati: I  TM Distance: >3 FB Neck ROM: Full    Dental  (+) Poor Dentition, Dental Advisory Given   Pulmonary Current Smoker and Patient abstained from smoking.,    Pulmonary exam normal        Cardiovascular hypertension, Normal cardiovascular exam     Neuro/Psych PSYCHIATRIC DISORDERS Anxiety Essential tremor. negative neurological ROS     GI/Hepatic negative GI ROS, Neg liver ROS,   Endo/Other  negative endocrine ROS  Renal/GU negative Renal ROS     Musculoskeletal negative musculoskeletal ROS (+)   Abdominal   Peds  Hematology negative hematology ROS (+)   Anesthesia Other Findings   Reproductive/Obstetrics                            Anesthesia Physical Anesthesia Plan  ASA: 2  Anesthesia Plan: MAC   Post-op Pain Management:    Induction:   PONV Risk Score and Plan: Ondansetron and Propofol infusion  Airway Management Planned: Natural Airway and Simple Face Mask  Additional Equipment:   Intra-op Plan:   Post-operative Plan:   Informed Consent: I have reviewed the patients History and Physical, chart, labs and discussed the procedure including the risks, benefits and alternatives for the proposed anesthesia with the patient or authorized representative who has indicated his/her understanding and acceptance.     Dental advisory given  Plan Discussed with: Anesthesiologist and CRNA  Anesthesia Plan Comments:        Anesthesia Quick Evaluation

## 2021-08-28 ENCOUNTER — Other Ambulatory Visit: Payer: Self-pay

## 2021-08-28 ENCOUNTER — Ambulatory Visit (HOSPITAL_COMMUNITY)
Admission: RE | Admit: 2021-08-28 | Discharge: 2021-08-28 | Disposition: A | Discharge status: Home / Self Care | Payer: Medicare Other | Attending: Gastroenterology | Admitting: Gastroenterology

## 2021-08-28 ENCOUNTER — Encounter (HOSPITAL_COMMUNITY)
Admission: RE | Disposition: A | Discharge status: Home / Self Care | Payer: Self-pay | Source: Home / Self Care | Attending: Gastroenterology

## 2021-08-28 ENCOUNTER — Ambulatory Visit (HOSPITAL_COMMUNITY): Payer: Medicare Other | Admitting: Anesthesiology

## 2021-08-28 DIAGNOSIS — K5730 Diverticulosis of large intestine without perforation or abscess without bleeding: Secondary | ICD-10-CM | POA: Diagnosis not present

## 2021-08-28 DIAGNOSIS — K5520 Angiodysplasia of colon without hemorrhage: Secondary | ICD-10-CM | POA: Diagnosis not present

## 2021-08-28 DIAGNOSIS — K621 Rectal polyp: Secondary | ICD-10-CM | POA: Diagnosis not present

## 2021-08-28 DIAGNOSIS — F17200 Nicotine dependence, unspecified, uncomplicated: Secondary | ICD-10-CM | POA: Insufficient documentation

## 2021-08-28 DIAGNOSIS — K921 Melena: Secondary | ICD-10-CM | POA: Insufficient documentation

## 2021-08-28 DIAGNOSIS — I10 Essential (primary) hypertension: Secondary | ICD-10-CM | POA: Insufficient documentation

## 2021-08-28 DIAGNOSIS — K625 Hemorrhage of anus and rectum: Secondary | ICD-10-CM | POA: Diagnosis not present

## 2021-08-28 DIAGNOSIS — K635 Polyp of colon: Secondary | ICD-10-CM | POA: Diagnosis not present

## 2021-08-28 DIAGNOSIS — K649 Unspecified hemorrhoids: Secondary | ICD-10-CM | POA: Diagnosis not present

## 2021-08-28 DIAGNOSIS — D128 Benign neoplasm of rectum: Secondary | ICD-10-CM | POA: Insufficient documentation

## 2021-08-28 HISTORY — PX: HEMOSTASIS CLIP PLACEMENT: SHX6857

## 2021-08-28 HISTORY — PX: COLONOSCOPY WITH PROPOFOL: SHX5780

## 2021-08-28 HISTORY — PX: POLYPECTOMY: SHX5525

## 2021-08-28 HISTORY — PX: HOT HEMOSTASIS: SHX5433

## 2021-08-28 SURGERY — COLONOSCOPY WITH PROPOFOL
Anesthesia: Monitor Anesthesia Care

## 2021-08-28 MED ORDER — LACTATED RINGERS IV SOLN: INTRAVENOUS | Status: DC

## 2021-08-28 MED ORDER — SODIUM CHLORIDE 0.9 % IV SOLN: INTRAVENOUS | Status: DC

## 2021-08-28 MED ORDER — PROPOFOL 10 MG/ML IV BOLUS
INTRAVENOUS | Status: DC | PRN
  Administered 2021-08-28: 20 mg via INTRAVENOUS

## 2021-08-28 MED ORDER — PROPOFOL 500 MG/50ML IV EMUL
INTRAVENOUS | Status: AC
  Filled 2021-08-28: qty 50

## 2021-08-28 MED ORDER — PROPOFOL 500 MG/50ML IV EMUL
INTRAVENOUS | Status: DC | PRN
  Administered 2021-08-28: 130 ug/kg/min via INTRAVENOUS

## 2021-08-28 SURGICAL SUPPLY — 22 items

## 2021-08-28 NOTE — Op Note (Signed)
[facility] Patient Name: [name] Procedure Date: 08/28/2021 MRN: [id number] Attending MD: Carol Ada , MD Date of Birth: 01/16/1951 CSN: [id number] Age: 71 Admit Type: Outpatient Procedure:                Colonoscopy Indications:              Hematochezia Providers:                Carol Ada, MD, Dulcy Fanny, Frazier Richards,                            Technician Referring MD:              Medicines:                Propofol per Anesthesia Complications:            No immediate complications. Estimated Blood Loss:     Estimated blood loss: none. Procedure:                Pre-Anesthesia Assessment:                           - Prior to the procedure, a History and Physical                            was performed, and patient medications and                            allergies were reviewed. The patient's tolerance of                            previous anesthesia was also reviewed. The risks                            and benefits of the procedure and the sedation                            options and risks were discussed with the patient.                            All questions were answered, and informed consent                            was obtained. Prior Anticoagulants: The patient has                            taken no previous anticoagulant or antiplatelet                            agents. ASA Grade Assessment: III - A patient with                            severe systemic disease. After reviewing the risks                            and benefits, the patient was  deemed in                            satisfactory condition to undergo the procedure.                           - Sedation was administered by an anesthesia                            professional. Deep sedation was attained.                           After obtaining informed consent, the colonoscope                            was passed under direct vision. Throughout the                             procedure, the patient's blood pressure, pulse, and                            oxygen saturations were monitored continuously. The                            PCF-HQ190L ([id number] Olympus colonoscope was                            introduced through the anus and advanced to the the                            cecum, identified by appendiceal orifice and                            ileocecal valve. The colonoscopy was performed                            without difficulty. The patient tolerated the                            procedure well. The quality of the bowel                            preparation was evaluated using the BBPS [facility]                            Bowel Preparation Scale) with scores of: Right                            Colon = 3 (entire mucosa seen well with no residual                            staining, small fragments of stool or opaque  liquid), Transverse Colon = 3 (entire mucosa seen                            well with no residual staining, small fragments of                            stool or opaque liquid) and Left Colon = 3 (entire                            mucosa seen well with no residual staining, small                            fragments of stool or opaque liquid). The total                            BBPS score equals 9. The quality of the bowel                            preparation was good. The ileocecal valve,                            appendiceal orifice, and rectum were photographed. Scope In: 10:05:57 AM Scope Out: 10:27:45 AM Scope Withdrawal Time: 0 hours 19 minutes 12 seconds  Total Procedure Duration: 0 hours 21 minutes 48 seconds  Findings:      A 4 mm polyp was found in the rectum. The polyp was sessile. The polyp       was removed with a cold snare. Resection and retrieval were complete.      Multiple medium-sized patchy angiodysplastic lesions without bleeding       were found in the ascending  colon and in the cecum. Coagulation for       tissue destruction using monopolar probe was successful. Estimated blood       loss: none. To prevent bleeding post-intervention, four hemostatic clips       were successfully placed (MR conditional).      A few medium-mouthed diverticula were found in the transverse colon and       ascending colon. Impression:               - One 4 mm polyp in the rectum, removed with a cold                            snare. Resected and retrieved.                           - Multiple non-bleeding colonic angiodysplastic                            lesions. Treated with a monopolar probe. Clips (MR                            conditional) were placed.                           - Diverticulosis in the transverse colon  and in the                            ascending colon. Moderate Sedation:      Not Applicable - Patient had care per Anesthesia. Recommendation:           - Patient has a contact number available for                            emergencies. The signs and symptoms of potential                            delayed complications were discussed with the                            patient. Return to normal activities tomorrow.                            Written discharge instructions were provided to the                            patient.                           - Resume previous diet.                           - Continue present medications.                           - Repeat colonoscopy is not recommended for                            surveillance. Procedure Code(s):        --- Professional ---                           [phone], Colonoscopy, flexible; with ablation of                            tumor(s), polyp(s), or other lesion(s) (includes                            pre- and post-dilation and guide wire passage, when                            performed)                           [id number], 59, Colonoscopy, flexible; with removal of                             tumor(s), polyp(s), or other lesion(s) by snare                            technique Diagnosis Code(s):        --- Professional ---  K62.1, Rectal polyp                           K55.20, Angiodysplasia of colon without hemorrhage                           K92.1, Melena (includes Hematochezia)                           K57.30, Diverticulosis of large intestine without                            perforation or abscess without bleeding CPT copyright 2019 [employer]. All rights reserved. The codes documented in this report are preliminary and upon coder review may  be revised to meet current compliance requirements. Carol Ada, MD Carol Ada, MD 08/28/2021 10:33:58 AM This report has been signed electronically. Number of Addenda: 0

## 2021-08-28 NOTE — Anesthesia Postprocedure Evaluation (Signed)
Anesthesia Post Note  Patient: [name]  Procedure(s) Performed: COLONOSCOPY WITH PROPOFOL HOT HEMOSTASIS (ARGON PLASMA COAGULATION/BICAP) HEMOSTASIS CLIP PLACEMENT POLYPECTOMY     Patient location during evaluation: PACU Anesthesia Type: MAC Level of consciousness: awake and alert Pain management: pain level controlled Vital Signs Assessment: post-procedure vital signs reviewed and stable Respiratory status: spontaneous breathing and respiratory function stable Cardiovascular status: stable Postop Assessment: no apparent nausea or vomiting Anesthetic complications: no   No notable events documented.  Last Vitals:  Vitals:   08/28/21 1041 08/28/21 1051  BP: 133/69 (!) 140/96  Pulse: 65 74  Resp: 18 19  Temp:    SpO2: 99% 99%    Last Pain:  Vitals:   08/28/21 1051  TempSrc:   PainSc: 0-No pain                 [name] DANIEL

## 2021-08-28 NOTE — H&P (Signed)
[name] HPI: This 71 year old white female presents to the office for evaluation of intermittent rectal bleeding that she has had off and on for several months now she attributes this to her hemorrhoids. Over the last week she has had some left lower quadrant discomfort with some chills and feels she may have a recurrent bout of diverticulitis. She averages 1 BM per day with no obvious blood or mucus in the stool. She uses Anucort suppositories as needed for rectal discomfort. She has good appetite. She has lost 6 pounds over the past year. She denies having any complaints of abdominal pain, nausea, vomiting, acid reflux, dysphagia or odynophagia. She denies having a family history of colon cancer, celiac sprue or IBD. Her last colonoscopy done on 10/27/2007 revealed external and internal hemorrhoids, a healthy anastomosis noted at 20 cm and a couple scattered diverticula. She had a colonoscopy scheduled 12/2019 but was canceled as she had a flareup of her diverticulitis.   Past Medical History:  Diagnosis Date   Arthritis    Cervical spinal stenosis    Complication of anesthesia    "makes me cry for days."   Diverticulitis    Family history of anesthesia complication    Sister has nausea and vomitting.   Hypercholesteremia    Hypertension     Past Surgical History:  Procedure Laterality Date   ABDOMINAL HYSTERECTOMY  1995   ANTERIOR CERVICAL DECOMP/DISCECTOMY FUSION  12/30/11   BACK SURGERY  1992    COLECTOMY  2004   "lost 1/3 of my colon"   THORACIC FUSION  1992   TUMOR REMOVAL     small intestine    Family History  Problem Relation Age of Onset   Anesthesia problems Sister    Allergic rhinitis Sister    Food Allergy Sister        shellfish   Angioedema Neg Hx    Asthma Neg Hx    Eczema Neg Hx    Urticaria Neg Hx    Breast cancer Neg Hx     Social History:  reports that she has been smoking cigarettes. She has a 20.00 pack-year smoking history. She has never used  smokeless tobacco. She reports that she does not drink alcohol and does not use drugs.  Allergies:  Allergies  Allergen Reactions   Lyrica [Pregabalin] Other (See Comments)    Made symptoms worse   Simvastatin Other (See Comments)    Myalgias   Statins Other (See Comments)    Myalgias   Meloxicam Other (See Comments)    Makes her sweat and muscle cramps     Medications: Scheduled: Continuous:  sodium chloride      No results found for this or any previous visit (from the past 24 hour(s)).   No results found.  ROS:  As stated above in the HPI otherwise negative.  There were no vitals taken for this visit.    PE: Gen: NAD, Alert and Oriented HEENT:  [place]/AT, EOMI Neck: Supple, no LAD Lungs: CTA Bilaterally CV: RRR without M/G/R ABD: Soft, NTND, +BS Ext: No C/C/E  Assessment/Plan: 1) Hematochezia - colonoscopy.  Peace Noyes D 08/28/2021, 9:28 AM

## 2021-08-28 NOTE — Discharge Instructions (Signed)

## 2021-08-28 NOTE — Transfer of Care (Signed)
Immediate Anesthesia Transfer of Care Note  Patient: [name]  Procedure(s) Performed: COLONOSCOPY WITH PROPOFOL HOT HEMOSTASIS (ARGON PLASMA COAGULATION/BICAP) HEMOSTASIS CLIP PLACEMENT POLYPECTOMY  Patient Location: PACU  Anesthesia Type:MAC  Level of Consciousness: sedated  Airway & Oxygen Therapy: Patient Spontanous Breathing and Patient connected to face mask oxygen  Post-op Assessment: Report given to RN and Post -op Vital signs reviewed and stable  Post vital signs: Reviewed and stable  Last Vitals:  Vitals Value Taken Time  BP    Temp    Pulse    Resp    SpO2      Last Pain:  Vitals:   08/28/21 0946  TempSrc: Temporal  PainSc: 4          Complications: No notable events documented.

## 2021-08-31 ENCOUNTER — Encounter (HOSPITAL_COMMUNITY): Payer: Self-pay | Admitting: Gastroenterology

## 2021-08-31 LAB — SURGICAL PATHOLOGY

## 2021-09-06 DIAGNOSIS — R059 Cough, unspecified: Secondary | ICD-10-CM | POA: Diagnosis not present

## 2021-09-06 DIAGNOSIS — H6123 Impacted cerumen, bilateral: Secondary | ICD-10-CM | POA: Diagnosis not present

## 2021-09-22 ENCOUNTER — Other Ambulatory Visit: Payer: Self-pay | Admitting: Physician Assistant

## 2021-09-22 ENCOUNTER — Ambulatory Visit
Admission: RE | Admit: 2021-09-22 | Discharge: 2021-09-22 | Disposition: A | Discharge status: Home / Self Care | Payer: Medicare Other | Source: Ambulatory Visit | Attending: Physician Assistant | Admitting: Physician Assistant

## 2021-09-22 DIAGNOSIS — E1169 Type 2 diabetes mellitus with other specified complication: Secondary | ICD-10-CM | POA: Diagnosis not present

## 2021-09-22 DIAGNOSIS — R059 Cough, unspecified: Secondary | ICD-10-CM | POA: Diagnosis not present

## 2021-09-22 DIAGNOSIS — F17200 Nicotine dependence, unspecified, uncomplicated: Secondary | ICD-10-CM | POA: Diagnosis not present

## 2021-09-22 DIAGNOSIS — I700 Atherosclerosis of aorta: Secondary | ICD-10-CM | POA: Diagnosis not present

## 2021-09-28 ENCOUNTER — Other Ambulatory Visit: Payer: Self-pay | Admitting: *Deleted

## 2021-09-28 ENCOUNTER — Telehealth: Payer: Self-pay | Admitting: Family Medicine

## 2021-09-28 MED ORDER — CETIRIZINE HCL 10 MG PO TABS: 10.0000 mg | ORAL_TABLET | Freq: Every day | ORAL | 5 refills | Status: DC

## 2021-09-28 NOTE — Telephone Encounter (Signed)
Called and left a voicemail asking for patient to return call to discuss.  °

## 2021-09-28 NOTE — Telephone Encounter (Signed)
[name] called in and states her head is so itchy.  [name] stated she "tested positive for mites"  and she is having a time and keeps stating she needs "some help"  I asked her what was going on and she keeps repeating she needs help and that he head is itching.  [name] stated she has tried everything and nothing is helping and would like a shampoo called in to [facility] on [address].  Please advise.

## 2021-09-28 NOTE — Telephone Encounter (Signed)
Patient called back and stated that she was not taking an antihistamine. I advised that I have sent in a prescription for Zyrtec for her and if it is not covered to purchase over the counter. I advised if it does not help with the itching to please call us and let us know. Patient verbalized understanding, she did state that she is taking all all the precautions to protect against dust mites.

## 2021-09-28 NOTE — Telephone Encounter (Signed)
Can you please have this patient begin taking an oral antihistamine once a day. Some examples of over the counter antihistamines include Zyrtec (cetirizine), Xyzal (levocetirizine), Allegra (fexofenadine), and Claritin (loratidine). Please send out avoidance measures for dust mites including dust mite pillow and mattress covers. Thank you

## 2021-10-16 NOTE — Patient Instructions (Addendum)
Allergic rhinitis(dust mites) ?Allergen avoidance measures are listed below ?Continue cetirizine 10 mg once a day as needed for a runny nose or itch ?Consider saline nasal rinses as needed for nasal symptoms. Use this before any medicated nasal sprays for best result ? ?Rash/hair loss ?We will get some lab work due to your itching. We will call you with results once they are all back ?The rash on your arms does not look like an allergic reaction. I do not see a rash on your scalp or arms. ?Recommend continue to follow up with your dermatologist/primary care physician concerning rash/hair loss ?If your symptoms re-occur, begin a journal of events that occurred for up to 6 hours before your symptoms began including foods and beverages consumed, soaps or perfumes you had contact with, and medications.  ?You may benefit from going to an integrative health provider such as [facility] ? ?Your blood pressure is elevated at today's office visit.  Please schedule an appointment with your primary care physician to discuss. ?Call the clinic if this treatment plan is not working well for you ? ?Follow up in  3 months or sooner if needed. ? ? ?Control of Dust Mite Allergen ?Dust mites play a major role in allergic asthma and rhinitis. They occur in environments with high humidity wherever human skin is found. Dust mites absorb humidity from the atmosphere (ie, they do not drink) and feed on organic matter (including shed human and animal skin). Dust mites are a microscopic type of insect that you cannot see with the naked eye. High levels of dust mites have been detected from mattresses, pillows, carpets, upholstered furniture, bed covers, clothes, soft toys and any woven material. The principal allergen of the dust mite is found in its feces. A gram of dust may contain 1,000 mites and 250,000 fecal particles. Mite antigen is easily measured in the air during house cleaning activities. Dust mites do not bite  and do not cause harm to humans, other than by triggering allergies/asthma. ? ?Ways to decrease your exposure to dust mites in your home: ? ?1. Encase mattresses, box springs and pillows with a mite-impermeable barrier or cover ? ?2. Wash sheets, blankets and drapes weekly in hot water (130? F) with detergent and dry them in a dryer on the hot setting. ? ?3. Have the room cleaned frequently with a vacuum cleaner and a damp dust-mop. For carpeting or rugs, vacuuming with a vacuum cleaner equipped with a high-efficiency particulate air (HEPA) filter. The dust mite allergic individual should not be in a room which is being cleaned and should wait 1 hour after cleaning before going into the room. ? ?4. Do not sleep on upholstered furniture (eg, couches). ? ?5. If possible removing carpeting, upholstered furniture and drapery from the home is ideal. Horizontal blinds should be eliminated in the rooms where the person spends the most time (bedroom, study, television room). Washable vinyl, roller-type shades are optimal. ? ?6. Remove all non-washable stuffed toys from the bedroom. Wash stuffed toys weekly like sheets and blankets above. ? ?7. Reduce indoor humidity to less than 50%. Inexpensive humidity monitors can be purchased at most hardware stores. Do not use a humidifier as can make the problem worse and are not recommended. ? ?

## 2021-10-19 ENCOUNTER — Encounter: Payer: Self-pay | Admitting: Family

## 2021-10-19 ENCOUNTER — Other Ambulatory Visit: Payer: Self-pay

## 2021-10-19 ENCOUNTER — Ambulatory Visit: Payer: Medicare Other | Admitting: Family

## 2021-10-19 VITALS — BP 150/90 | HR 91 | Temp 98.2°F | Resp 12 | Ht 65.0 in | Wt 196.2 lb

## 2021-10-19 DIAGNOSIS — J3089 Other allergic rhinitis: Secondary | ICD-10-CM | POA: Diagnosis not present

## 2021-10-19 DIAGNOSIS — L659 Nonscarring hair loss, unspecified: Secondary | ICD-10-CM

## 2021-10-19 DIAGNOSIS — L299 Pruritus, unspecified: Secondary | ICD-10-CM | POA: Diagnosis not present

## 2021-10-19 DIAGNOSIS — R21 Rash and other nonspecific skin eruption: Secondary | ICD-10-CM | POA: Diagnosis not present
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
## 2021-10-20 LAB — CBC WITH DIFFERENTIAL
Basophils Absolute: 0.1 10*3/uL (ref 0.0–0.2)
Basos: 1 %
EOS (ABSOLUTE): 0.2 10*3/uL (ref 0.0–0.4)
Eos: 2 %
Hematocrit: 47.1 % — ABNORMAL HIGH (ref 34.0–46.6)
Hemoglobin: 15.8 g/dL (ref 11.1–15.9)
Immature Grans (Abs): 0 10*3/uL (ref 0.0–0.1)
Immature Granulocytes: 0 %
Lymphocytes Absolute: 2.9 10*3/uL (ref 0.7–3.1)
Lymphs: 26 %
MCH: 31.3 pg (ref 26.6–33.0)
MCHC: 33.5 g/dL (ref 31.5–35.7)
MCV: 94 fL (ref 79–97)
Monocytes Absolute: 0.8 10*3/uL (ref 0.1–0.9)
Monocytes: 7 %
Neutrophils Absolute: 7.1 10*3/uL — ABNORMAL HIGH (ref 1.4–7.0)
Neutrophils: 64 %
RBC: 5.04 x10E6/uL (ref 3.77–5.28)
RDW: 13.2 % (ref 11.7–15.4)
WBC: 11.2 10*3/uL — ABNORMAL HIGH (ref 3.4–10.8)

## 2021-10-20 LAB — COMPREHENSIVE METABOLIC PANEL
ALT: 18 IU/L (ref 0–32)
AST: 17 IU/L (ref 0–40)
Albumin/Globulin Ratio: 0.8 — ABNORMAL LOW (ref 1.2–2.2)
Albumin: 4 g/dL (ref 3.7–4.7)
Alkaline Phosphatase: 62 IU/L (ref 44–121)
BUN/Creatinine Ratio: 19 (ref 12–28)
BUN: 20 mg/dL (ref 8–27)
Bilirubin Total: 0.5 mg/dL (ref 0.0–1.2)
CO2: 23 mmol/L (ref 20–29)
Calcium: 9.3 mg/dL (ref 8.7–10.3)
Chloride: 101 mmol/L (ref 96–106)
Creatinine, Ser: 1.04 mg/dL — ABNORMAL HIGH (ref 0.57–1.00)
Globulin, Total: 4.9 g/dL — ABNORMAL HIGH (ref 1.5–4.5)
Glucose: 74 mg/dL (ref 70–99)
Potassium: 4.8 mmol/L (ref 3.5–5.2)
Sodium: 139 mmol/L (ref 134–144)
Total Protein: 8.9 g/dL — ABNORMAL HIGH (ref 6.0–8.5)
eGFR: 57 mL/min/{1.73_m2} — ABNORMAL LOW (ref >59)

## 2021-10-20 LAB — THYROID CASCADE PROFILE: TSH: 2.93 u[IU]/mL (ref 0.450–4.500)

## 2021-10-21 NOTE — Progress Notes (Signed)
Please let [name] know that I did not see anything to cause her rash that sometimes itches. Please send a copy of her labs to her primary care physician. ? ?She can try to increase her Zyrtec to twice a day as needed to see if this helps with the itching that she sometimes has. Let her know that this can be sedating so be cautious. ? ?I would recommend her going back to dermatology and having them biopsy the rash. ? ?Also, have her schedule a follow up with Dr. Nelva [name] or Dr. Ernst [name].

## 2021-10-27 DIAGNOSIS — M47812 Spondylosis without myelopathy or radiculopathy, cervical region: Secondary | ICD-10-CM | POA: Diagnosis not present

## 2021-10-27 DIAGNOSIS — M542 Cervicalgia: Secondary | ICD-10-CM | POA: Diagnosis not present

## 2021-10-27 DIAGNOSIS — G894 Chronic pain syndrome: Secondary | ICD-10-CM | POA: Diagnosis not present

## 2021-10-27 DIAGNOSIS — Z6832 Body mass index (BMI) 32.0-32.9, adult: Secondary | ICD-10-CM | POA: Diagnosis not present

## 2021-11-06 DIAGNOSIS — M542 Cervicalgia: Secondary | ICD-10-CM | POA: Diagnosis not present

## 2021-11-06 DIAGNOSIS — R531 Weakness: Secondary | ICD-10-CM | POA: Diagnosis not present

## 2021-11-06 DIAGNOSIS — M40203 Unspecified kyphosis, cervicothoracic region: Secondary | ICD-10-CM | POA: Diagnosis not present

## 2021-11-06 DIAGNOSIS — M2569 Stiffness of other specified joint, not elsewhere classified: Secondary | ICD-10-CM | POA: Diagnosis not present

## 2021-11-17 DIAGNOSIS — R531 Weakness: Secondary | ICD-10-CM | POA: Diagnosis not present

## 2021-11-17 DIAGNOSIS — M2569 Stiffness of other specified joint, not elsewhere classified: Secondary | ICD-10-CM | POA: Diagnosis not present

## 2021-11-17 DIAGNOSIS — M40203 Unspecified kyphosis, cervicothoracic region: Secondary | ICD-10-CM | POA: Diagnosis not present

## 2021-11-17 DIAGNOSIS — M542 Cervicalgia: Secondary | ICD-10-CM | POA: Diagnosis not present

## 2021-11-27 DIAGNOSIS — R531 Weakness: Secondary | ICD-10-CM | POA: Diagnosis not present

## 2021-11-27 DIAGNOSIS — M2569 Stiffness of other specified joint, not elsewhere classified: Secondary | ICD-10-CM | POA: Diagnosis not present

## 2021-11-27 DIAGNOSIS — M542 Cervicalgia: Secondary | ICD-10-CM | POA: Diagnosis not present

## 2021-11-27 DIAGNOSIS — M40203 Unspecified kyphosis, cervicothoracic region: Secondary | ICD-10-CM | POA: Diagnosis not present

## 2021-12-08 DIAGNOSIS — D485 Neoplasm of uncertain behavior of skin: Secondary | ICD-10-CM | POA: Diagnosis not present

## 2021-12-08 DIAGNOSIS — L821 Other seborrheic keratosis: Secondary | ICD-10-CM | POA: Diagnosis not present

## 2021-12-08 DIAGNOSIS — D692 Other nonthrombocytopenic purpura: Secondary | ICD-10-CM | POA: Diagnosis not present

## 2021-12-08 DIAGNOSIS — L218 Other seborrheic dermatitis: Secondary | ICD-10-CM | POA: Diagnosis not present

## 2021-12-11 DIAGNOSIS — R531 Weakness: Secondary | ICD-10-CM | POA: Diagnosis not present

## 2021-12-11 DIAGNOSIS — M40203 Unspecified kyphosis, cervicothoracic region: Secondary | ICD-10-CM | POA: Diagnosis not present

## 2021-12-11 DIAGNOSIS — M2569 Stiffness of other specified joint, not elsewhere classified: Secondary | ICD-10-CM | POA: Diagnosis not present

## 2021-12-11 DIAGNOSIS — M542 Cervicalgia: Secondary | ICD-10-CM | POA: Diagnosis not present

## 2021-12-25 DIAGNOSIS — R531 Weakness: Secondary | ICD-10-CM | POA: Diagnosis not present

## 2021-12-25 DIAGNOSIS — M2569 Stiffness of other specified joint, not elsewhere classified: Secondary | ICD-10-CM | POA: Diagnosis not present

## 2021-12-25 DIAGNOSIS — M542 Cervicalgia: Secondary | ICD-10-CM | POA: Diagnosis not present

## 2021-12-25 DIAGNOSIS — M40203 Unspecified kyphosis, cervicothoracic region: Secondary | ICD-10-CM | POA: Diagnosis not present

## 2022-01-15 DIAGNOSIS — M542 Cervicalgia: Secondary | ICD-10-CM | POA: Diagnosis not present

## 2022-01-15 DIAGNOSIS — M2569 Stiffness of other specified joint, not elsewhere classified: Secondary | ICD-10-CM | POA: Diagnosis not present

## 2022-01-15 DIAGNOSIS — R531 Weakness: Secondary | ICD-10-CM | POA: Diagnosis not present

## 2022-01-15 DIAGNOSIS — M40203 Unspecified kyphosis, cervicothoracic region: Secondary | ICD-10-CM | POA: Diagnosis not present

## 2022-01-25 DIAGNOSIS — M2569 Stiffness of other specified joint, not elsewhere classified: Secondary | ICD-10-CM | POA: Diagnosis not present

## 2022-01-25 DIAGNOSIS — M542 Cervicalgia: Secondary | ICD-10-CM | POA: Diagnosis not present

## 2022-01-25 DIAGNOSIS — R531 Weakness: Secondary | ICD-10-CM | POA: Diagnosis not present

## 2022-01-25 DIAGNOSIS — M40203 Unspecified kyphosis, cervicothoracic region: Secondary | ICD-10-CM | POA: Diagnosis not present

## 2022-01-29 DIAGNOSIS — M150 Primary generalized (osteo)arthritis: Secondary | ICD-10-CM | POA: Diagnosis not present

## 2022-02-05 ENCOUNTER — Other Ambulatory Visit: Payer: Self-pay | Admitting: Internal Medicine

## 2022-02-05 ENCOUNTER — Other Ambulatory Visit: Payer: Self-pay | Admitting: Family Medicine

## 2022-02-05 DIAGNOSIS — G720 Drug-induced myopathy: Secondary | ICD-10-CM | POA: Diagnosis not present

## 2022-02-05 DIAGNOSIS — Z122 Encounter for screening for malignant neoplasm of respiratory organs: Secondary | ICD-10-CM

## 2022-02-05 DIAGNOSIS — E1169 Type 2 diabetes mellitus with other specified complication: Secondary | ICD-10-CM | POA: Diagnosis not present

## 2022-02-05 DIAGNOSIS — E7800 Pure hypercholesterolemia, unspecified: Secondary | ICD-10-CM | POA: Diagnosis not present

## 2022-02-05 DIAGNOSIS — I10 Essential (primary) hypertension: Secondary | ICD-10-CM | POA: Diagnosis not present

## 2022-02-23 DIAGNOSIS — H524 Presbyopia: Secondary | ICD-10-CM | POA: Diagnosis not present

## 2022-03-02 DIAGNOSIS — F419 Anxiety disorder, unspecified: Secondary | ICD-10-CM | POA: Diagnosis not present

## 2022-03-02 DIAGNOSIS — F330 Major depressive disorder, recurrent, mild: Secondary | ICD-10-CM | POA: Diagnosis not present

## 2022-03-04 ENCOUNTER — Ambulatory Visit
Admission: RE | Admit: 2022-03-04 | Discharge: 2022-03-04 | Disposition: A | Discharge status: Home / Self Care | Payer: Medicare Other | Source: Ambulatory Visit | Attending: Internal Medicine | Admitting: Internal Medicine

## 2022-03-04 DIAGNOSIS — Z122 Encounter for screening for malignant neoplasm of respiratory organs: Secondary | ICD-10-CM

## 2022-03-04 DIAGNOSIS — F17210 Nicotine dependence, cigarettes, uncomplicated: Secondary | ICD-10-CM | POA: Diagnosis not present

## 2022-03-08 ENCOUNTER — Other Ambulatory Visit: Payer: Medicare Other

## 2022-03-12 ENCOUNTER — Other Ambulatory Visit: Payer: Self-pay | Admitting: Internal Medicine

## 2022-03-15 ENCOUNTER — Other Ambulatory Visit: Payer: Self-pay | Admitting: Internal Medicine

## 2022-03-15 DIAGNOSIS — N289 Disorder of kidney and ureter, unspecified: Secondary | ICD-10-CM

## 2022-03-17 DIAGNOSIS — L219 Seborrheic dermatitis, unspecified: Secondary | ICD-10-CM | POA: Diagnosis not present

## 2022-03-17 DIAGNOSIS — J302 Other seasonal allergic rhinitis: Secondary | ICD-10-CM | POA: Diagnosis not present

## 2022-03-17 DIAGNOSIS — N289 Disorder of kidney and ureter, unspecified: Secondary | ICD-10-CM | POA: Diagnosis not present

## 2022-03-17 DIAGNOSIS — R531 Weakness: Secondary | ICD-10-CM | POA: Diagnosis not present

## 2022-03-18 ENCOUNTER — Ambulatory Visit
Admission: RE | Admit: 2022-03-18 | Discharge: 2022-03-18 | Disposition: A | Discharge status: Home / Self Care | Payer: Medicare Other | Source: Ambulatory Visit | Attending: Internal Medicine | Admitting: Internal Medicine

## 2022-03-18 ENCOUNTER — Other Ambulatory Visit: Payer: Medicare Other

## 2022-03-18 DIAGNOSIS — K859 Acute pancreatitis without necrosis or infection, unspecified: Secondary | ICD-10-CM | POA: Diagnosis not present

## 2022-03-18 DIAGNOSIS — K861 Other chronic pancreatitis: Secondary | ICD-10-CM | POA: Diagnosis not present

## 2022-03-18 DIAGNOSIS — N2889 Other specified disorders of kidney and ureter: Secondary | ICD-10-CM | POA: Diagnosis not present

## 2022-03-18 DIAGNOSIS — N289 Disorder of kidney and ureter, unspecified: Secondary | ICD-10-CM

## 2022-03-18 DIAGNOSIS — K8689 Other specified diseases of pancreas: Secondary | ICD-10-CM | POA: Diagnosis not present

## 2022-03-18 MED ORDER — IOPAMIDOL (ISOVUE-370) INJECTION 76%
100.0000 mL | Freq: Once | INTRAVENOUS | Status: AC | PRN
  Administered 2022-03-18: 100 mL via INTRAVENOUS

## 2022-04-14 DIAGNOSIS — F419 Anxiety disorder, unspecified: Secondary | ICD-10-CM | POA: Diagnosis not present

## 2022-04-14 DIAGNOSIS — F330 Major depressive disorder, recurrent, mild: Secondary | ICD-10-CM | POA: Diagnosis not present

## 2022-07-22 DIAGNOSIS — F419 Anxiety disorder, unspecified: Secondary | ICD-10-CM | POA: Diagnosis not present

## 2022-07-22 DIAGNOSIS — F330 Major depressive disorder, recurrent, mild: Secondary | ICD-10-CM | POA: Diagnosis not present

## 2022-08-26 DIAGNOSIS — E1169 Type 2 diabetes mellitus with other specified complication: Secondary | ICD-10-CM | POA: Diagnosis not present

## 2022-08-26 DIAGNOSIS — H6123 Impacted cerumen, bilateral: Secondary | ICD-10-CM | POA: Diagnosis not present

## 2022-08-26 DIAGNOSIS — Z0000 Encounter for general adult medical examination without abnormal findings: Secondary | ICD-10-CM | POA: Diagnosis not present

## 2022-08-26 DIAGNOSIS — E7800 Pure hypercholesterolemia, unspecified: Secondary | ICD-10-CM | POA: Diagnosis not present

## 2022-08-26 DIAGNOSIS — E559 Vitamin D deficiency, unspecified: Secondary | ICD-10-CM | POA: Diagnosis not present

## 2022-08-26 DIAGNOSIS — I10 Essential (primary) hypertension: Secondary | ICD-10-CM | POA: Diagnosis not present

## 2022-08-26 DIAGNOSIS — Z130 Encounter for screening for diseases of the blood and blood-forming organs and certain disorders involving the immune mechanism: Secondary | ICD-10-CM | POA: Diagnosis not present

## 2022-08-26 DIAGNOSIS — I700 Atherosclerosis of aorta: Secondary | ICD-10-CM | POA: Diagnosis not present

## 2022-09-14 ENCOUNTER — Encounter: Payer: Self-pay | Admitting: *Deleted

## 2022-10-14 DIAGNOSIS — K5900 Constipation, unspecified: Secondary | ICD-10-CM | POA: Diagnosis not present

## 2022-10-14 DIAGNOSIS — K625 Hemorrhage of anus and rectum: Secondary | ICD-10-CM | POA: Diagnosis not present

## 2022-10-14 DIAGNOSIS — E669 Obesity, unspecified: Secondary | ICD-10-CM | POA: Diagnosis not present

## 2022-10-14 DIAGNOSIS — K5730 Diverticulosis of large intestine without perforation or abscess without bleeding: Secondary | ICD-10-CM | POA: Diagnosis not present

## 2022-10-18 ENCOUNTER — Other Ambulatory Visit: Payer: Self-pay | Admitting: Internal Medicine

## 2022-10-18 DIAGNOSIS — F17200 Nicotine dependence, unspecified, uncomplicated: Secondary | ICD-10-CM

## 2022-10-21 DIAGNOSIS — L821 Other seborrheic keratosis: Secondary | ICD-10-CM | POA: Diagnosis not present

## 2022-10-26 DIAGNOSIS — K625 Hemorrhage of anus and rectum: Secondary | ICD-10-CM | POA: Diagnosis not present

## 2022-11-09 ENCOUNTER — Ambulatory Visit
Admission: RE | Admit: 2022-11-09 | Discharge: 2022-11-09 | Disposition: A | Discharge status: Home / Self Care | Payer: Medicare Other | Source: Ambulatory Visit | Attending: Internal Medicine | Admitting: Internal Medicine

## 2022-11-09 DIAGNOSIS — I700 Atherosclerosis of aorta: Secondary | ICD-10-CM | POA: Diagnosis not present

## 2022-11-09 DIAGNOSIS — R911 Solitary pulmonary nodule: Secondary | ICD-10-CM | POA: Diagnosis not present

## 2022-11-09 DIAGNOSIS — R918 Other nonspecific abnormal finding of lung field: Secondary | ICD-10-CM | POA: Diagnosis not present

## 2022-11-09 DIAGNOSIS — F17200 Nicotine dependence, unspecified, uncomplicated: Secondary | ICD-10-CM

## 2022-11-23 DIAGNOSIS — I10 Essential (primary) hypertension: Secondary | ICD-10-CM | POA: Diagnosis not present

## 2022-11-23 DIAGNOSIS — M5412 Radiculopathy, cervical region: Secondary | ICD-10-CM | POA: Diagnosis not present

## 2022-11-23 DIAGNOSIS — R911 Solitary pulmonary nodule: Secondary | ICD-10-CM | POA: Diagnosis not present

## 2022-12-08 DIAGNOSIS — K080 Exfoliation of teeth due to systemic causes: Secondary | ICD-10-CM | POA: Diagnosis not present

## 2022-12-23 DIAGNOSIS — M47812 Spondylosis without myelopathy or radiculopathy, cervical region: Secondary | ICD-10-CM | POA: Diagnosis not present

## 2022-12-29 DIAGNOSIS — K080 Exfoliation of teeth due to systemic causes: Secondary | ICD-10-CM | POA: Diagnosis not present

## 2023-01-06 DIAGNOSIS — M47812 Spondylosis without myelopathy or radiculopathy, cervical region: Secondary | ICD-10-CM | POA: Diagnosis not present

## 2023-02-07 DIAGNOSIS — L821 Other seborrheic keratosis: Secondary | ICD-10-CM | POA: Diagnosis not present

## 2023-02-07 DIAGNOSIS — L820 Inflamed seborrheic keratosis: Secondary | ICD-10-CM | POA: Diagnosis not present

## 2023-02-07 DIAGNOSIS — L538 Other specified erythematous conditions: Secondary | ICD-10-CM | POA: Diagnosis not present

## 2023-02-14 DIAGNOSIS — H25812 Combined forms of age-related cataract, left eye: Secondary | ICD-10-CM | POA: Diagnosis not present

## 2023-02-25 DIAGNOSIS — I10 Essential (primary) hypertension: Secondary | ICD-10-CM | POA: Diagnosis not present

## 2023-02-25 DIAGNOSIS — F17210 Nicotine dependence, cigarettes, uncomplicated: Secondary | ICD-10-CM | POA: Diagnosis not present

## 2023-02-25 DIAGNOSIS — E1169 Type 2 diabetes mellitus with other specified complication: Secondary | ICD-10-CM | POA: Diagnosis not present

## 2023-02-25 DIAGNOSIS — H6123 Impacted cerumen, bilateral: Secondary | ICD-10-CM | POA: Diagnosis not present

## 2023-02-25 DIAGNOSIS — E1136 Type 2 diabetes mellitus with diabetic cataract: Secondary | ICD-10-CM | POA: Diagnosis not present

## 2023-02-25 DIAGNOSIS — M5412 Radiculopathy, cervical region: Secondary | ICD-10-CM | POA: Diagnosis not present

## 2023-03-17 DIAGNOSIS — H25812 Combined forms of age-related cataract, left eye: Secondary | ICD-10-CM | POA: Diagnosis not present

## 2023-03-25 DIAGNOSIS — I10 Essential (primary) hypertension: Secondary | ICD-10-CM | POA: Diagnosis not present

## 2023-03-25 DIAGNOSIS — F17200 Nicotine dependence, unspecified, uncomplicated: Secondary | ICD-10-CM | POA: Diagnosis not present

## 2023-03-25 DIAGNOSIS — R059 Cough, unspecified: Secondary | ICD-10-CM | POA: Diagnosis not present

## 2023-03-25 DIAGNOSIS — J019 Acute sinusitis, unspecified: Secondary | ICD-10-CM | POA: Diagnosis not present

## 2023-04-29 DIAGNOSIS — M47812 Spondylosis without myelopathy or radiculopathy, cervical region: Secondary | ICD-10-CM | POA: Diagnosis not present

## 2023-05-11 DIAGNOSIS — T148XXA Other injury of unspecified body region, initial encounter: Secondary | ICD-10-CM | POA: Diagnosis not present

## 2023-05-16 ENCOUNTER — Other Ambulatory Visit: Payer: Self-pay | Admitting: Internal Medicine

## 2023-05-16 DIAGNOSIS — R911 Solitary pulmonary nodule: Secondary | ICD-10-CM

## 2023-05-24 DIAGNOSIS — H26491 Other secondary cataract, right eye: Secondary | ICD-10-CM | POA: Diagnosis not present

## 2023-05-26 ENCOUNTER — Emergency Department (HOSPITAL_COMMUNITY): Payer: Medicare Other

## 2023-05-26 ENCOUNTER — Emergency Department (HOSPITAL_COMMUNITY)
Admission: EM | Admit: 2023-05-26 | Discharge: 2023-05-26 | Disposition: A | Discharge status: Home / Self Care | Payer: Medicare Other | Attending: Emergency Medicine | Admitting: Emergency Medicine

## 2023-05-26 ENCOUNTER — Encounter (HOSPITAL_COMMUNITY): Payer: Self-pay

## 2023-05-26 ENCOUNTER — Other Ambulatory Visit: Payer: Self-pay

## 2023-05-26 DIAGNOSIS — I700 Atherosclerosis of aorta: Secondary | ICD-10-CM | POA: Diagnosis not present

## 2023-05-26 DIAGNOSIS — M48061 Spinal stenosis, lumbar region without neurogenic claudication: Secondary | ICD-10-CM | POA: Diagnosis not present

## 2023-05-26 DIAGNOSIS — R1031 Right lower quadrant pain: Secondary | ICD-10-CM | POA: Diagnosis not present

## 2023-05-26 DIAGNOSIS — N281 Cyst of kidney, acquired: Secondary | ICD-10-CM | POA: Diagnosis not present

## 2023-05-26 DIAGNOSIS — M545 Low back pain, unspecified: Secondary | ICD-10-CM | POA: Insufficient documentation

## 2023-05-26 DIAGNOSIS — M5136 Other intervertebral disc degeneration, lumbar region with discogenic back pain only: Secondary | ICD-10-CM | POA: Diagnosis not present

## 2023-05-26 DIAGNOSIS — K5730 Diverticulosis of large intestine without perforation or abscess without bleeding: Secondary | ICD-10-CM | POA: Diagnosis not present

## 2023-05-26 DIAGNOSIS — K861 Other chronic pancreatitis: Secondary | ICD-10-CM | POA: Diagnosis not present

## 2023-05-26 DIAGNOSIS — M5459 Other low back pain: Secondary | ICD-10-CM | POA: Diagnosis not present

## 2023-05-26 LAB — CBC
HCT: 47.1 % — ABNORMAL HIGH (ref 36.0–46.0)
Hemoglobin: 15.4 g/dL — ABNORMAL HIGH (ref 12.0–15.0)
MCH: 32 pg (ref 26.0–34.0)
MCHC: 32.7 g/dL (ref 30.0–36.0)
MCV: 97.9 fL (ref 80.0–100.0)
Platelets: 258 10*3/uL (ref 150–400)
RBC: 4.81 MIL/uL (ref 3.87–5.11)
RDW: 13.3 % (ref 11.5–15.5)
WBC: 9.6 10*3/uL (ref 4.0–10.5)
nRBC: 0 % (ref 0.0–0.2)

## 2023-05-26 LAB — URINALYSIS, ROUTINE W REFLEX MICROSCOPIC
Bilirubin Urine: NEGATIVE
Glucose, UA: NEGATIVE mg/dL
Hgb urine dipstick: NEGATIVE
Ketones, ur: NEGATIVE mg/dL
Leukocytes,Ua: NEGATIVE
Nitrite: NEGATIVE
Protein, ur: NEGATIVE mg/dL
Specific Gravity, Urine: 1.011 (ref 1.005–1.030)
pH: 5 (ref 5.0–8.0)

## 2023-05-26 LAB — COMPREHENSIVE METABOLIC PANEL
ALT: 18 U/L (ref 0–44)
AST: 18 U/L (ref 15–41)
Albumin: 3.7 g/dL (ref 3.5–5.0)
Alkaline Phosphatase: 46 U/L (ref 38–126)
Anion gap: 9 (ref 5–15)
BUN: 17 mg/dL (ref 8–23)
CO2: 25 mmol/L (ref 22–32)
Calcium: 8.7 mg/dL — ABNORMAL LOW (ref 8.9–10.3)
Chloride: 103 mmol/L (ref 98–111)
Creatinine, Ser: 0.93 mg/dL (ref 0.44–1.00)
GFR, Estimated: >60 mL/min (ref >60)
Glucose, Bld: 125 mg/dL — ABNORMAL HIGH (ref 70–99)
Potassium: 3.7 mmol/L (ref 3.5–5.1)
Sodium: 137 mmol/L (ref 135–145)
Total Bilirubin: 0.9 mg/dL (ref 0.3–1.2)
Total Protein: 8.5 g/dL — ABNORMAL HIGH (ref 6.5–8.1)

## 2023-05-26 LAB — LIPASE, BLOOD: Lipase: 19 U/L (ref 11–51)

## 2023-05-26 MED ORDER — OXYCODONE-ACETAMINOPHEN 5-325 MG PO TABS
1.0000 | ORAL_TABLET | ORAL | Status: DC | PRN
  Administered 2023-05-26: 1 via ORAL
  Filled 2023-05-26: qty 1

## 2023-05-26 MED ORDER — SODIUM CHLORIDE (PF) 0.9 % IJ SOLN
INTRAMUSCULAR | Status: AC
  Filled 2023-05-26: qty 50

## 2023-05-26 MED ORDER — METHOCARBAMOL 500 MG PO TABS: 500.0000 mg | ORAL_TABLET | Freq: Two times a day (BID) | ORAL | 0 refills | Status: DC | PRN

## 2023-05-26 MED ORDER — MORPHINE SULFATE (PF) 4 MG/ML IV SOLN
4.0000 mg | Freq: Once | INTRAVENOUS | Status: DC
  Filled 2023-05-26: qty 1

## 2023-05-26 MED ORDER — LIDOCAINE 5 % EX PTCH
1.0000 | MEDICATED_PATCH | CUTANEOUS | Status: DC
  Administered 2023-05-26: 1 via TRANSDERMAL
  Filled 2023-05-26: qty 1

## 2023-05-26 MED ORDER — IOHEXOL 300 MG/ML  SOLN
100.0000 mL | Freq: Once | INTRAMUSCULAR | Status: AC | PRN
  Administered 2023-05-26: 100 mL via INTRAVENOUS

## 2023-05-26 NOTE — ED Triage Notes (Signed)
Patient reports pain in the RLQ to the hip area. Denies falling. Patient states the pain has gotten worse over the last few days. Denies N/V, diarrhea or constipation. Also reports normal urine output. Patient state she has been taking tylenol with no relief.

## 2023-05-26 NOTE — ED Provider Notes (Signed)
[facility] EMERGENCY DEPARTMENT AT [facility] Provider Note   CSN: [id number] Arrival date & time: 05/26/23  4098     History  Chief Complaint  Patient presents with   Abdominal Pain    [name] is a 72 y.o. female.   Abdominal Pain 72 year old female history of hypertension, hypercholesterolemia presenting for right lower quadrant/hip pain.  Pain is been going on for the last few days without precipitant.  No trauma.  Pain is inside just over her right hip.  She has some right lower quadrant abdominal pain and right-sided lower back pain as well.  No radicular pain, no weakness or numbness.  She is able to walk but has pain with this.  Eating normally, no nausea or vomiting.  Normal bowel movements.  No urinary symptoms.  She has not had pain like this before.  Still has her appendix.  No history of kidney stones.  No fevers.  No chest pain or shortness of breath.     Home Medications Prior to Admission medications   Medication Sig Start Date End Date Taking? Authorizing Provider  acetaminophen (TYLENOL) 500 MG tablet Take 500 mg by mouth every 6 (six) hours as needed for mild pain, moderate pain, fever or headache.    [provider]  ALPRAZolam Prudy Feeler) 0.5 MG tablet Take 0.25 tablets by mouth every 6 (six) hours as needed for anxiety or sleep. anxiety 02/20/15   [provider]  Ascorbic Acid (VITAMIN C PO) Take 1 tablet by mouth 3 (three) times a week.    [provider]  calcium carbonate (OSCAL) 1500 (600 Ca) MG TABS tablet 1 tablet with meals    [provider]  cetirizine (ZYRTEC ALLERGY) 10 MG tablet Take 1 tablet (10 mg total) by mouth daily. 09/28/21   Hetty Blend, FNP  Cholecalciferol (VITAMIN D) 50 MCG (2000 UT) tablet Take 2,000 Units by mouth daily.     [provider]  Ferrous Sulfate (SLOW FE) 142 (45 Fe) MG TBCR Take 1 tablet by mouth 3 (three) times a week.    [provider]  Multiple  Vitamins-Iron (MULTI-VITAMIN/IRON PO) Take 1 tablet by mouth 3 (three) times a week.    [provider]      Allergies    Lyrica [pregabalin], Simvastatin, Statins, and Meloxicam    Review of Systems   Review of Systems  Gastrointestinal:  Positive for abdominal pain.  Review of systems completed and notable as per HPI.  ROS otherwise negative.   Physical Exam Updated Vital Signs BP (!) 146/78 (BP Location: Right Arm)   Pulse 69   Temp 98 F (36.7 C) (Oral)   Resp (!) 21   Ht 5\' 5"  (1.651 m)   Wt 81.6 kg   SpO2 97%   BMI 29.95 kg/m  Physical Exam Vitals and nursing note reviewed.  Constitutional:      General: She is not in acute distress.    Appearance: She is well-developed.  HENT:     Head: Normocephalic and atraumatic.  Eyes:     Conjunctiva/sclera: Conjunctivae normal.  Cardiovascular:     Rate and Rhythm: Normal rate and regular rhythm.     Heart sounds: No murmur heard. Pulmonary:     Effort: Pulmonary effort is normal. No respiratory distress.     Breath sounds: Normal breath sounds.  Abdominal:     Palpations: Abdomen is soft.     Tenderness: There is abdominal tenderness. There is no right  CVA tenderness, left CVA tenderness, guarding or rebound.     Comments: Mild tenderness in the right lower quadrant and over the right SI joint.  She has no midline spinal tenderness.  Negative straight leg raise.  Musculoskeletal:        General: No swelling.     Cervical back: Neck supple.     Comments: No midline spinal tenderness.  Full strength and normal sensation to lower extremities.  Able to ambulate although has pain with this.  Skin:    General: Skin is warm and dry.     Capillary Refill: Capillary refill takes less than 2 seconds.  Neurological:     General: No focal deficit present.     Mental Status: She is alert and oriented to person, place, and time. Mental status is at baseline.  Psychiatric:        Mood and Affect: Mood normal.     ED  Results / Procedures / Treatments   Labs (all labs ordered are listed, but only abnormal results are displayed) Labs Reviewed  COMPREHENSIVE METABOLIC PANEL - Abnormal; Notable for the following components:      Result Value   Glucose, Bld 125 (*)    Calcium 8.7 (*)    Total Protein 8.5 (*)    All other components within normal limits  CBC - Abnormal; Notable for the following components:   Hemoglobin 15.4 (*)    HCT 47.1 (*)    All other components within normal limits  LIPASE, BLOOD  URINALYSIS, ROUTINE W REFLEX MICROSCOPIC    EKG None  Radiology CT L-SPINE NO CHARGE  Result Date: 05/26/2023 CLINICAL DATA:  Low back pain, right lower quadrant pain, right hip pain EXAM: CT LUMBAR SPINE WITHOUT CONTRAST TECHNIQUE: Multidetector CT imaging of the lumbar spine was performed without intravenous contrast administration. Multiplanar CT image reconstructions were also generated. RADIATION DOSE REDUCTION: This exam was performed according to the departmental dose-optimization program which includes automated exposure control, adjustment of the mA and/or kV according to patient size and/or use of iterative reconstruction technique. COMPARISON:  CT today and 03/18/2022. FINDINGS: Segmentation: 5 lumbar type vertebrae. Alignment: Normal Vertebrae: No acute fracture or focal pathologic process. Paraspinal and other soft tissues: Negative paraspinal soft tissues. Aortic atherosclerosis. Disc levels: Diffuse degenerative disc disease with disc space narrowing and anterior spurring, most pronounced at L5-S1. Moderate diffuse degenerative facet disease. No disc herniation, central or neural foraminal stenosis. IMPRESSION: Degenerative disc and facet disease. No acute bony abnormality. Electronically Signed   By: Charlett Nose M.D.   On: 05/26/2023 09:15   CT ABDOMEN PELVIS W CONTRAST  Result Date: 05/26/2023 CLINICAL DATA:  Right lower quadrant pain, right hip pain, low back pain EXAM: CT ABDOMEN AND  PELVIS WITH CONTRAST TECHNIQUE: Multidetector CT imaging of the abdomen and pelvis was performed using the standard protocol following bolus administration of intravenous contrast. RADIATION DOSE REDUCTION: This exam was performed according to the departmental dose-optimization program which includes automated exposure control, adjustment of the mA and/or kV according to patient size and/or use of iterative reconstruction technique. CONTRAST:  OMNIPAQUE IOHEXOL 300 MG/ML  SOLN COMPARISON:  03/18/2022 FINDINGS: Lower chest: No acute abnormality. Hepatobiliary: Diffuse low-density throughout the liver compatible with fatty infiltration. No focal abnormality. Gallbladder unremarkable. Pancreas: No focal abnormality or ductal dilatation. Scattered calcifications compatible with chronic pancreatitis. Mild atrophy. Spleen: No focal abnormality.  Normal size. Adrenals/Urinary Tract: Adrenal glands unremarkable. Scattered left renal cysts measuring up to 2.8 cm in the  lower pole. These have a benign appearance. No follow-up imaging recommended. No stones or hydronephrosis. Urinary bladder unremarkable. Stomach/Bowel: Scattered colonic diverticula. No active diverticulitis. Normal appendix. Stomach and small bowel decompressed, unremarkable. Small paraumbilical ventral hernia containing small bowel loop. No evidence of bowel obstruction. Vascular/Lymphatic: Aortic atherosclerosis. No evidence of aneurysm or adenopathy. Reproductive: Prior hysterectomy.  No adnexal masses. Other: No free fluid or free air. Musculoskeletal: No acute bony abnormality. IMPRESSION: No acute findings in the abdomen or pelvis. Hepatic steatosis. Aortic atherosclerosis. Stable changes of chronic pancreatitis. No evidence of acute pancreatitis. Colonic diverticulosis. Small paraumbilical ventral hernia containing small bowel loop. No obstruction. Electronically Signed   By: Charlett Nose M.D.   On: 05/26/2023 09:12    Procedures Procedures     Medications Ordered in ED Medications  morphine (PF) 4 MG/ML injection 4 mg (has no administration in time range)  iohexol (OMNIPAQUE) 300 MG/ML solution 100 mL (100 mLs Intravenous Contrast Given 05/26/23 0827)    ED Course/ Medical Decision Making/ A&P                                 Medical Decision Making Amount and/or Complexity of Data Reviewed Labs: ordered. Radiology: ordered.  Risk Prescription drug management.   Medical Decision Making:   ORVELLA DIGIULIO is a 72 y.o. female who presented to the ED today with right lower quadrant pain.  Vital signs reviewed.  On exam she is somewhat uncomfortable.  She is tender in the right lower quadrant her abdomen as well as over her right SI joint.  Unclear if this is referred muscle skeletal pain versus abdominal pain.  She has no neurologic deficits or signs of cauda equina syndrome or spinal cord compression. evaluate CT of her lumbar spine and abdomen to evaluate for possible intra-abdominal pathology occluding appendicitis.   Patient placed on continuous vitals and telemetry monitoring while in ED which was reviewed periodically.  Reviewed and confirmed nursing documentation for past medical history, family history, social history.   Reassessment and Plan:   Scan reviewed, L-spine with some degenerative changes but no acute abnormality.  Abdominal CT without signs of appendicitis, obstruction.  No bony abnormalities near the hip.  She does have periumbilical hernia, my exam I do not have any palpable hernia and she has no tenderness over this area, low concern for incarceration, obstruction.  I suspect this is more muscle skeletal, repeat exam she is more tender over the SI joint and right posterior hip.  No signs of overlying skin infection.  Will treat with course of muscle relaxers, recommend close PCP follow-up.  Discharged with strict turn precautions.  She is feeling significantly better.   Patient's presentation is most  consistent with acute complicated illness / injury requiring diagnostic workup.           Final Clinical Impression(s) / ED Diagnoses Final diagnoses:  Acute right-sided low back pain without sciatica    Rx / DC Orders ED Discharge Orders     None         Laurence Spates, MD 05/26/23 [id number]

## 2023-05-26 NOTE — Discharge Instructions (Signed)
You were seen today for right lower back pain.  Your CT scan showed some degenerative changes as well as a small umbilical hernia.  I suspect your pain may be due to a muscular strain.  You can take Tylenol, lidocaine patches and the prescribed Robaxin for this.  Do not drive while taking Robaxin as it may make you drowsy.  You should follow-up with your doctor.  If you develop severe pain, weakness, numbness, difficulty going the bathroom you should return to the ED.

## 2023-06-01 DIAGNOSIS — M47812 Spondylosis without myelopathy or radiculopathy, cervical region: Secondary | ICD-10-CM | POA: Diagnosis not present

## 2023-06-01 DIAGNOSIS — Z6830 Body mass index (BMI) 30.0-30.9, adult: Secondary | ICD-10-CM | POA: Diagnosis not present

## 2023-06-07 DIAGNOSIS — K5730 Diverticulosis of large intestine without perforation or abscess without bleeding: Secondary | ICD-10-CM | POA: Diagnosis not present

## 2023-06-07 DIAGNOSIS — R933 Abnormal findings on diagnostic imaging of other parts of digestive tract: Secondary | ICD-10-CM | POA: Diagnosis not present

## 2023-06-07 DIAGNOSIS — K439 Ventral hernia without obstruction or gangrene: Secondary | ICD-10-CM | POA: Diagnosis not present

## 2023-06-07 DIAGNOSIS — K760 Fatty (change of) liver, not elsewhere classified: Secondary | ICD-10-CM | POA: Diagnosis not present

## 2023-06-08 ENCOUNTER — Ambulatory Visit
Admission: RE | Admit: 2023-06-08 | Discharge: 2023-06-08 | Disposition: A | Discharge status: Home / Self Care | Payer: Medicare Other | Source: Ambulatory Visit | Attending: Internal Medicine | Admitting: Internal Medicine

## 2023-06-08 DIAGNOSIS — R911 Solitary pulmonary nodule: Secondary | ICD-10-CM

## 2023-06-08 DIAGNOSIS — R918 Other nonspecific abnormal finding of lung field: Secondary | ICD-10-CM | POA: Diagnosis not present

## 2023-07-08 ENCOUNTER — Telehealth: Payer: Self-pay | Admitting: Pulmonary Disease

## 2023-07-08 NOTE — Telephone Encounter (Signed)
Patient is an urgent referral. Lafonda Mosses from Dr. Ames Dura is willing to get a PET scan on the patient if necessary. Please call back and advise. Appointment for lung consult has not been made yet. Waiting on referral to be sent via fax from their office.

## 2023-07-12 DIAGNOSIS — J988 Other specified respiratory disorders: Secondary | ICD-10-CM | POA: Diagnosis not present

## 2023-07-12 NOTE — Telephone Encounter (Signed)
We can't answer this question a doctor has to order and we have never seen the patient so it should for through the pcp so they can get the auth for the PET scan

## 2023-07-13 NOTE — Telephone Encounter (Signed)
Referral was received, scanned into chart. Please advise if patient needs PET scan from PCP prior to being seen at our office. LDCT was completed 06/08/2023. Also next available opening is mid January with [facility] or [facility].

## 2023-07-18 ENCOUNTER — Ambulatory Visit
Admission: RE | Admit: 2023-07-18 | Discharge: 2023-07-18 | Disposition: A | Discharge status: Home / Self Care | Payer: Medicare Other | Source: Ambulatory Visit | Attending: Family Medicine | Admitting: Family Medicine

## 2023-07-18 ENCOUNTER — Other Ambulatory Visit: Payer: Self-pay | Admitting: Family Medicine

## 2023-07-18 DIAGNOSIS — J988 Other specified respiratory disorders: Secondary | ICD-10-CM | POA: Diagnosis not present

## 2023-07-18 DIAGNOSIS — R918 Other nonspecific abnormal finding of lung field: Secondary | ICD-10-CM | POA: Diagnosis not present

## 2023-07-18 DIAGNOSIS — R0601 Orthopnea: Secondary | ICD-10-CM

## 2023-07-18 DIAGNOSIS — R0989 Other specified symptoms and signs involving the circulatory and respiratory systems: Secondary | ICD-10-CM | POA: Diagnosis not present

## 2023-07-18 DIAGNOSIS — R059 Cough, unspecified: Secondary | ICD-10-CM | POA: Diagnosis not present

## 2023-07-19 ENCOUNTER — Other Ambulatory Visit (HOSPITAL_COMMUNITY): Payer: Self-pay | Admitting: Internal Medicine

## 2023-07-19 DIAGNOSIS — R911 Solitary pulmonary nodule: Secondary | ICD-10-CM

## 2023-07-20 DIAGNOSIS — K080 Exfoliation of teeth due to systemic causes: Secondary | ICD-10-CM | POA: Diagnosis not present

## 2023-07-25 NOTE — Telephone Encounter (Signed)
Patients PET is scheduled for 07/28/23 and consult visit is scheduled with SG on 08/03/23. Pt verbalized understanding. Patient aware we will call her back on 12/17 if her results are not back yet for appt.

## 2023-07-27 ENCOUNTER — Telehealth: Payer: Self-pay | Admitting: Acute Care

## 2023-07-27 NOTE — Telephone Encounter (Signed)
Ignacia Marvel RN spoke with pt. [facility] is unable to do pt'sPET on 12/12. Patient already has OV with Kandice Robinsons NP on 12/18 to review results and decide next steps. Can pt have PET at [facility] or another facility in order to keep her OV with Maralyn Sago on 12/18?  PCCs please advise. Thanks.

## 2023-07-27 NOTE — Telephone Encounter (Signed)
Returned call. Per patient Imaging machine is down, part ordered. She has been rescheduled for 12/23 to complete her PET. Patient is open to moving her scan to a new location so that she can complete imaging ASAP. Patient can go any day/time as long as she ishome by 5pm, she doe snot drive at night.

## 2023-07-27 NOTE — Telephone Encounter (Signed)
PT got a call that the CT scanner was down so she needs to move up her FU appt w/Ms. Groce. I see no open appts. Please call PT to advise. Can someone else read results?   [phone]

## 2023-07-27 NOTE — Telephone Encounter (Signed)
[name], [facility] and [place] are the only places around here that a PET scan can be done at

## 2023-07-28 ENCOUNTER — Encounter (HOSPITAL_COMMUNITY): Payer: Medicare Other

## 2023-07-28 NOTE — Telephone Encounter (Signed)
Called and spoke with patient. Advises that she [place] not drive on the highway. [facility] is here only option. Patient to keep 08/08/2023 PET scan at [facility]. PET f/u scheduled with Maralyn Sago 08/23/2023

## 2023-08-03 ENCOUNTER — Institutional Professional Consult (permissible substitution): Payer: Medicare Other | Admitting: Acute Care

## 2023-08-08 ENCOUNTER — Encounter (HOSPITAL_COMMUNITY)
Admission: RE | Admit: 2023-08-08 | Discharge: 2023-08-08 | Disposition: A | Discharge status: Home / Self Care | Payer: Medicare Other | Source: Ambulatory Visit | Attending: Internal Medicine | Admitting: Internal Medicine

## 2023-08-08 DIAGNOSIS — R911 Solitary pulmonary nodule: Secondary | ICD-10-CM | POA: Insufficient documentation

## 2023-08-08 DIAGNOSIS — R918 Other nonspecific abnormal finding of lung field: Secondary | ICD-10-CM | POA: Diagnosis not present

## 2023-08-08 LAB — GLUCOSE, CAPILLARY: Glucose-Capillary: 92 mg/dL (ref 70–99)

## 2023-08-08 MED ORDER — FLUDEOXYGLUCOSE F - 18 (FDG) INJECTION
9.0000 | Freq: Once | INTRAVENOUS | Status: AC | PRN
  Administered 2023-08-08: 8.93 via INTRAVENOUS

## 2023-08-11 NOTE — Telephone Encounter (Signed)
NFN 

## 2023-08-22 DIAGNOSIS — I10 Essential (primary) hypertension: Secondary | ICD-10-CM | POA: Diagnosis not present

## 2023-08-22 DIAGNOSIS — E7800 Pure hypercholesterolemia, unspecified: Secondary | ICD-10-CM | POA: Diagnosis not present

## 2023-08-22 DIAGNOSIS — Z0000 Encounter for general adult medical examination without abnormal findings: Secondary | ICD-10-CM | POA: Diagnosis not present

## 2023-08-22 DIAGNOSIS — G250 Essential tremor: Secondary | ICD-10-CM | POA: Diagnosis not present

## 2023-08-22 DIAGNOSIS — E1169 Type 2 diabetes mellitus with other specified complication: Secondary | ICD-10-CM | POA: Diagnosis not present

## 2023-08-22 DIAGNOSIS — R229 Localized swelling, mass and lump, unspecified: Secondary | ICD-10-CM | POA: Diagnosis not present

## 2023-08-23 ENCOUNTER — Encounter: Payer: Self-pay | Admitting: Emergency Medicine

## 2023-08-23 ENCOUNTER — Ambulatory Visit: Payer: Medicare Other | Admitting: Acute Care

## 2023-08-23 ENCOUNTER — Encounter: Payer: Self-pay | Admitting: Acute Care

## 2023-08-23 VITALS — BP 136/78 | HR 93 | Ht 65.0 in | Wt 175.8 lb

## 2023-08-23 DIAGNOSIS — F17210 Nicotine dependence, cigarettes, uncomplicated: Secondary | ICD-10-CM | POA: Diagnosis not present

## 2023-08-23 DIAGNOSIS — F17200 Nicotine dependence, unspecified, uncomplicated: Secondary | ICD-10-CM

## 2023-08-23 DIAGNOSIS — R911 Solitary pulmonary nodule: Secondary | ICD-10-CM

## 2023-08-23 NOTE — Progress Notes (Signed)
 History of Present Illness [name] is a 73 y.o. female current every day smoker a 54.4 pack year smoking history referred for abnormal pulmonary nodules 08/2023. She had follow up PET imaging 08/08/2023. She will be followed by Dr. Delton Coombes.   Past Medical History:  Diagnosis Date   Arthritis    Cervical spinal stenosis    Complication of anesthesia    "makes me cry for days."   Diverticulitis    Family history of anesthesia complication    Sister has nausea and vomitting.   Hypercholesteremia    Hypertension       08/23/2023 Pt. Presents for consultation after LDCT Screening scan done 07/05/2023 was found to be abnormal.Scan was read as a 4B, as there were suspicious bilateral progressive pulmonary nodules including a mixed attenuation right upper lobe lesion concerning for bronchogenic cancer. PET scan was ordered, patient is here today to review the results.  The patient, a smoker with a history of tremors, was referred for evaluation of pulmonary nodules identified on a low-dose CT scan. The scan was initially performed as a screening due to the patient's smoking history. The patient denies any respiratory symptoms such as cough or shortness of breath.  Subsequent PET scan done 08/08/2023 revealed three nodules in the upper lobe with hypermetabolic activity, raising concern for possible malignancy. The patient has no signs of infection, further increasing the suspicion of cancer. The patient has no known history of sleep apnea, diabetes, or use of blood thinners.  We discussed the option of biopsy to better evaluate these nodules and to obtain definitive tissue diagnosis.We discussed how the biopsy is done, and the risks as well as the benefits.   The patient expresses concern about the logistics of undergoing a biopsy due to lack of transportation and support. She lives alone and does not have immediate family or friends to accompany her for the procedure, which she  understands is required. She will work on getting family or friends to help her the day of the bronchoscopy with biopsies.   The patient also expresses a desire to quit smoking and has previously attempted to do so. She reports a current consumption of approximately six cigarettes per day, down from previous levels.  Test Results: 08/08/2023 PET  The 3 pulmonary nodules of concern have maximum SUV varying between 1.8 and 2.5. 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 2. Chronic calcific pancreatitis. 3. Small umbilical hernia contains adipose tissue. 4. Small ventral hernia in the lower pelvis contains adipose tissue and a small loop of small bowel without findings of obstruction or complicating feature. 5. Scattered colonic diverticulosis. 6. Aortic and systemic atherosclerosis.  LDCT 06/08/2023 read 07/05/2023 Cardiovascular: The heart size is normal. No substantial pericardial effusion. Coronary artery calcification is evident. Moderate atherosclerotic calcification is noted in the wall of the thoracic aorta.   Mediastinum/Nodes: No mediastinal lymphadenopathy. No evidence for gross hilar lymphadenopathy although assessment is limited by the lack of intravenous contrast on the current study. The esophagus has normal imaging features. There is no axillary lymphadenopathy.   Lungs/Pleura: Bilateral pulmonary nodules again identified.   The 9.3 mm irregular left upper lobe nodule seen is progressive on the previous study shows continued further increase in size measuring 10.6 mm today (image 136) compared to 9.3 mm previously.   Dominant irregular mixed attenuation right upper lobe nodule is also progressive in the interval with confluent nodular central component now measuring 7.9 mm (  image 88) compared to 5 mm previously.   Solid right middle lobe nodule measuring 7.3 mm today on image 178 was 5.2 mm previously and  4.6 mm on 03/04/2022.   Additional scattered tiny solid and ground-glass opacities are seen bilaterally.   Upper Abdomen: Nodular thickening of the left thyroid gland is stable. 13 mm exophytic lesion upper pole left kidney characterized on recent CT abdomen/pelvis as benign cyst.   Musculoskeletal: No worrisome lytic or sclerotic osseous abnormality.   IMPRESSION: 1. Lung-RADS 4B, suspicious. Bilateral progressive pulmonary nodules including a mixed attenuation right upper lobe lesion. Additional imaging evaluation or consultation with Pulmonology or Thoracic Surgery recommended. 2.  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, no hypermetabolic lymph nodes     Latest Ref Rng & Units 05/26/2023    4:40 AM 10/19/2021   12:25 PM 12/27/2019    8:41 AM  CBC  WBC 4.0 - 10.5 K/uL 9.6  11.2  11.7   Hemoglobin 12.0 - 15.0 g/dL 96.0  45.4  09.8   Hematocrit 36.0 - 46.0 % 47.1  47.1  43.8   Platelets 150 - 400 K/uL 258   281        Latest Ref Rng & Units 05/26/2023    4:40 AM 10/19/2021   12:25 PM 12/27/2019    8:41 AM  BMP  Glucose 70 - 99 mg/dL 119  74  147   BUN 8 - 23 mg/dL 17  20  12    Creatinine 0.44 - 1.00 mg/dL 8.29  5.62  1.30   BUN/Creat Ratio 12 - 28  19    Sodium 135 - 145 mmol/L 137  139  136   Potassium 3.5 - 5.1 mmol/L 3.7  4.8  3.6   Chloride 98 - 111 mmol/L 103  101  103   CO2 22 - 32 mmol/L 25  23  24    Calcium 8.9 - 10.3 mg/dL 8.7  9.3  8.5     BNP No results found for: "BNP"  ProBNP No results found for: "PROBNP"  PFT No results found for: "FEV1PRE", "FEV1POST", "FVCPRE", "FVCPOST", "TLC", "DLCOUNC", "PREFEV1FVCRT", "PSTFEV1FVCRT"  NM PET Image Initial (PI) Skull Base To Thigh (F-18 FDG) Result Date: 08/22/2023 CLINICAL DATA:  Initial  treatment strategy for pulmonary nodule. EXAM: NUCLEAR MEDICINE PET SKULL BASE TO THIGH TECHNIQUE: 8.9 mCi F-18 FDG was injected intravenously. Full-ring PET imaging was performed from the skull base to thigh after the radiotracer. CT data was obtained and used for attenuation correction and anatomic localization. Fasting blood glucose: 92 mg/dl COMPARISON:  Chest CT 86/57/8469 FINDINGS: Mediastinal blood pool activity: SUV max 2.5 Liver activity: SUV max NA NECK: Physiologic glottic activity. No significant worrisome neck activity is observed. Incidental CT findings: Bilateral common carotid atheromatous vascular calcification. CHEST: The 9 mm lesion of concern in the left upper lobe on image 74 series 4 has maximum SUV of 2.0. The right upper lobe nodule previously measured at 7.9 mm is shown on image 61 of series 4 and has a maximum SUV of 1.8. A right middle lobe lesion previously measured as 7.3 mm average diameter has maximum SUV of 2.5 Incidental CT findings: Coronary, aortic arch, and branch vessel atherosclerotic vascular disease. ABDOMEN/PELVIS: No significant abnormal hypermetabolic activity in this region. Incidental CT findings: Atherosclerosis is present,  including aortoiliac atherosclerotic disease. Postoperative findings along the posterior margin of the stomach. Chronic calcific pancreatitis. Small umbilical hernia contains adipose tissue. Small ventral hernia in the lower pelvis contains adipose tissue and a small loop of small bowel without findings of obstruction or complicating feature. Scattered colonic diverticulosis. SKELETON: No significant abnormal hypermetabolic activity in this region. Incidental CT findings: Cervical plate and screw fixator. IMPRESSION: 1. The 3 pulmonary nodules of concern have maximum SUV varying between 1.8 and 2.5. 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 2. Chronic calcific pancreatitis. 3. Small umbilical  hernia contains adipose tissue. 4. Small ventral hernia in the lower pelvis contains adipose tissue and a small loop of small bowel without findings of obstruction or complicating feature. 5. Scattered colonic diverticulosis. 6. Aortic and systemic atherosclerosis. Aortic Atherosclerosis (ICD10-I70.0). Electronically Signed   By: Gaylyn Rong M.D.   On: 08/22/2023 09:27     Past medical hx Past Medical History:  Diagnosis Date   Arthritis    Cervical spinal stenosis    Complication of anesthesia    "makes me cry for days."   Diverticulitis    Family history of anesthesia complication    Sister has nausea and vomitting.   Hypercholesteremia    Hypertension      Social History   Tobacco Use   Smoking status: Every Day    Current packs/day: 1.00    Average packs/day: 1 pack/day for 54.4 years (54.4 ttl pk-yrs)    Types: Cigarettes    Start date: 04/12/1969   Smokeless tobacco: Never  Vaping Use   Vaping status: Never Used  Substance Use Topics   Alcohol use: No    Alcohol/week: 0.0 standard drinks of alcohol   Drug use: No    [name] reports that she has been smoking cigarettes. She started smoking about 54 years ago. She has a 54.4 pack-year smoking history. She has never used smokeless tobacco. She reports that she does not drink alcohol and does not use drugs.  Tobacco Cessation:  Smoking cessation Counseling  The patient's current tobacco use: 6 cigarettes a day, 54 pack year smoking history The patient was advised to quit and impact of smoking on their health.  I assessed the patient's willingness to attempt to quit.  I provided methods and skills for cessation. Resources to help quit smoking were provided, see AVS. A smoking cessation quit date was set: Advised to quit completely asap Follow-up was arranged in our clinic.  Counseled x  4 minutes today in clinic   Past surgical hx, Family hx, Social hx all reviewed.  Current Outpatient Medications on File  Prior to Visit  Medication Sig   acetaminophen (TYLENOL) 500 MG tablet Take 500 mg by mouth every 6 (six) hours as needed for mild pain, moderate pain, fever or headache.   ALPRAZolam (XANAX) 0.5 MG tablet Take 0.25 tablets by mouth every 6 (six) hours as needed for anxiety or sleep. anxiety   Ascorbic Acid (VITAMIN C PO) Take 1 tablet by mouth 3 (three) times a week.   calcium carbonate (OSCAL) 1500 (600 Ca) MG TABS tablet 1 tablet with meals   cetirizine (ZYRTEC ALLERGY) 10 MG tablet Take 1 tablet (10 mg total) by mouth daily.   Cholecalciferol (VITAMIN D) 50 MCG (2000 UT) tablet Take 2,000 Units by mouth daily.    Ferrous Sulfate (SLOW FE) 142 (45 Fe) MG TBCR Take 1 tablet by mouth 3 (three) times a week.   methocarbamol (  ROBAXIN) 500 MG tablet Take 1 tablet (500 mg total) by mouth 2 (two) times daily as needed for muscle spasms.   Multiple Vitamins-Iron (MULTI-VITAMIN/IRON PO) Take 1 tablet by mouth 3 (three) times a week.   propranolol (INDERAL) 20 MG tablet Take 20 mg by mouth 2 (two) times daily. (Patient not taking: Reported on 08/23/2023)   No current facility-administered medications on file prior to visit.     Allergies  Allergen Reactions   Lyrica [Pregabalin] Other (See Comments)    Made symptoms worse   Simvastatin Other (See Comments)    Myalgias   Statins Other (See Comments)    Myalgias   Meloxicam Other (See Comments)    Makes her sweat and muscle cramps     Review Of Systems:  Constitutional:   No  weight loss, night sweats,  Fevers, chills, fatigue, or  lassitude.  HEENT:   No headaches,  Difficulty swallowing,  Tooth/dental problems, or  Sore throat,                No sneezing, itching, ear ache, nasal congestion, post nasal drip,   CV:  No chest pain,  Orthopnea, PND, swelling in lower extremities, anasarca, dizziness, palpitations, syncope.   GI  No heartburn, indigestion, abdominal pain, nausea, vomiting, diarrhea, change in bowel habits, loss of appetite,  bloody stools.   Resp: No shortness of breath with exertion or at rest.  No excess mucus, no productive cough,  No non-productive cough,  No coughing up of blood.  No change in color of mucus.  No wheezing.  No chest wall deformity  Skin: no rash or lesions.  GU: no dysuria, change in color of urine, no urgency or frequency.  No flank pain, no hematuria   MS:  No joint pain or swelling.  No decreased range of motion.  No back pain.  Psych:  No change in mood or affect. No depression or anxiety.  No memory loss.   Vital Signs BP 136/78 (BP Location: Left Arm, Cuff Size: Normal)   Pulse 93   Ht 5\' 5"  (1.651 m)   Wt 175 lb 12.8 oz (79.7 kg)   SpO2 99%   BMI 29.25 kg/m    Physical Exam:  General- No distress,  A&Ox3, pleasant and anxious ENT: No sinus tenderness, TM clear, pale nasal mucosa, no oral exudate,no post nasal drip, no LAN Cardiac: S1, S2, regular rate and rhythm, no murmur Chest: No wheeze/ rales/ dullness; no accessory muscle use, no nasal flaring, no sternal retractions, slightly diminished per bases Abd.: Soft Non-tender, ND, BS +, Body mass index is 29.25 kg/m.  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 No symptoms of lung cancer. No evidence of spread to lymph nodes on PET scan. -Schedule bronchoscopic biopsy under general anesthesia. - Letter with details provided  - Discussed risks and benefits of bronchoscopy with biopsies -Follow-up one week post-procedure for biopsy results and to assess patient's condition.  Tobacco Use Patient is a current smoker, smoking approximately six cigarettes per day. Acknowledges need to quit. -Encouraged to gradually reduce cigarette  use and consider nicotine replacement therapy (gum or patch). -Provided information for 1-800-QUIT-NOW for access to free nicotine replacement therapy.  Social Support Patient has  limited social support and expressed concern about arranging transportation and post-procedure care for upcoming biopsy. -Encouraged patient to seek assistance from family, friends, or church community for transportation and post-procedure care.   Follow Up and Procedure information   August 23, 2023   [name] [address] [place] [address]   Dear Ms. Tasia Catchings,  The following has been scheduled for you:   Procedure: Bronchoscopy                 Surgeon:  Dr Delton Coombes  Procedure date:  09/05/23 at 3:00 pm  Arrival Time:  12:30 pm Location: [facility] [address], Entr[address] [place], [place] [address]   Additional Information: Do not eat anything after midnight - ok to take medications on the day of procedure with only sips of water You will need someone to drive you home after this procedure & to be with you for the next 24 hours You may receive a call from the hospital a day or two prior to procedure to go over additional instructions & medications  NEXT VISIT:  09/13/23 arriveat 1:15 pm With Kandice Robinsons, NP at    [facility]    [address] [place], [place]  [address]  I spent 40 minutes dedicated to the care of this patient on the date of this encounter to include pre-visit review of records, face-to-face time with the patient discussing conditions above, post visit ordering of testing, clinical documentation with the electronic health record, making appropriate referrals as documented, and communicating necessary information to the patient's healthcare team.    Bevelyn Ngo, NP 08/23/2023  9:15 AM

## 2023-08-23 NOTE — H&P (View-Only) (Signed)
History of Present Illness [name] is a 73 y.o. female current every day smoker a 54.4 pack year smoking history referred for abnormal pulmonary nodules 08/2023. She had follow up PET imaging 08/08/2023. She will be followed by Dr. Delton Coombes.   Past Medical History:  Diagnosis Date   Arthritis    Cervical spinal stenosis    Complication of anesthesia    "makes me cry for days."   Diverticulitis    Family history of anesthesia complication    Sister has nausea and vomitting.   Hypercholesteremia    Hypertension       08/23/2023 Pt. Presents for consultation after LDCT Screening scan done 07/05/2023 was found to be abnormal.Scan was read as a 4B, as there were suspicious bilateral progressive pulmonary nodules including a mixed attenuation right upper lobe lesion concerning for bronchogenic cancer. PET scan was ordered, patient is here today to review the results.  The patient, a smoker with a history of tremors, was referred for evaluation of pulmonary nodules identified on a low-dose CT scan. The scan was initially performed as a screening due to the patient's smoking history. The patient denies any respiratory symptoms such as cough or shortness of breath.  Subsequent PET scan done 08/08/2023 revealed three nodules in the upper lobe with hypermetabolic activity, raising concern for possible malignancy. The patient has no signs of infection, further increasing the suspicion of cancer. The patient has no known history of sleep apnea, diabetes, or use of blood thinners.  We discussed the option of biopsy to better evaluate these nodules and to obtain definitive tissue diagnosis.We discussed how the biopsy is done, and the risks as well as the benefits.   The patient expresses concern about the logistics of undergoing a biopsy due to lack of transportation and support. She lives alone and does not have immediate family or friends to accompany her for the procedure, which she  understands is required. She will work on getting family or friends to help her the day of the bronchoscopy with biopsies.   The patient also expresses a desire to quit smoking and has previously attempted to do so. She reports a current consumption of approximately six cigarettes per day, down from previous levels.  Test Results: 08/08/2023 PET  The 3 pulmonary nodules of concern have maximum SUV varying between 1.8 and 2.5. 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 2. Chronic calcific pancreatitis. 3. Small umbilical hernia contains adipose tissue. 4. Small ventral hernia in the lower pelvis contains adipose tissue and a small loop of small bowel without findings of obstruction or complicating feature. 5. Scattered colonic diverticulosis. 6. Aortic and systemic atherosclerosis.  LDCT 06/08/2023 read 07/05/2023 Cardiovascular: The heart size is normal. No substantial pericardial effusion. Coronary artery calcification is evident. Moderate atherosclerotic calcification is noted in the wall of the thoracic aorta.   Mediastinum/Nodes: No mediastinal lymphadenopathy. No evidence for gross hilar lymphadenopathy although assessment is limited by the lack of intravenous contrast on the current study. The esophagus has normal imaging features. There is no axillary lymphadenopathy.   Lungs/Pleura: Bilateral pulmonary nodules again identified.   The 9.3 mm irregular left upper lobe nodule seen is progressive on the previous study shows continued further increase in size measuring 10.6 mm today (image 136) compared to 9.3 mm previously.   Dominant irregular mixed attenuation right upper lobe nodule is also progressive in the interval with confluent nodular central component now measuring 7.9 mm (  image 88) compared to 5 mm previously.   Solid right middle lobe nodule measuring 7.3 mm today on image 178 was 5.2 mm previously and  4.6 mm on 03/04/2022.   Additional scattered tiny solid and ground-glass opacities are seen bilaterally.   Upper Abdomen: Nodular thickening of the left thyroid gland is stable. 13 mm exophytic lesion upper pole left kidney characterized on recent CT abdomen/pelvis as benign cyst.   Musculoskeletal: No worrisome lytic or sclerotic osseous abnormality.   IMPRESSION: 1. Lung-RADS 4B, suspicious. Bilateral progressive pulmonary nodules including a mixed attenuation right upper lobe lesion. Additional imaging evaluation or consultation with Pulmonology or Thoracic Surgery recommended. 2.  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, no hypermetabolic lymph nodes     Latest Ref Rng & Units 05/26/2023    4:40 AM 10/19/2021   12:25 PM 12/27/2019    8:41 AM  CBC  WBC 4.0 - 10.5 K/uL 9.6  11.2  11.7   Hemoglobin 12.0 - 15.0 g/dL 96.0  45.4  09.8   Hematocrit 36.0 - 46.0 % 47.1  47.1  43.8   Platelets 150 - 400 K/uL 258   281        Latest Ref Rng & Units 05/26/2023    4:40 AM 10/19/2021   12:25 PM 12/27/2019    8:41 AM  BMP  Glucose 70 - 99 mg/dL 119  74  147   BUN 8 - 23 mg/dL 17  20  12    Creatinine 0.44 - 1.00 mg/dL 8.29  5.62  1.30   BUN/Creat Ratio 12 - 28  19    Sodium 135 - 145 mmol/L 137  139  136   Potassium 3.5 - 5.1 mmol/L 3.7  4.8  3.6   Chloride 98 - 111 mmol/L 103  101  103   CO2 22 - 32 mmol/L 25  23  24    Calcium 8.9 - 10.3 mg/dL 8.7  9.3  8.5     BNP No results found for: "BNP"  ProBNP No results found for: "PROBNP"  PFT No results found for: "FEV1PRE", "FEV1POST", "FVCPRE", "FVCPOST", "TLC", "DLCOUNC", "PREFEV1FVCRT", "PSTFEV1FVCRT"  NM PET Image Initial (PI) Skull Base To Thigh (F-18 FDG) Result Date: 08/22/2023 CLINICAL DATA:  Initial  treatment strategy for pulmonary nodule. EXAM: NUCLEAR MEDICINE PET SKULL BASE TO THIGH TECHNIQUE: 8.9 mCi F-18 FDG was injected intravenously. Full-ring PET imaging was performed from the skull base to thigh after the radiotracer. CT data was obtained and used for attenuation correction and anatomic localization. Fasting blood glucose: 92 mg/dl COMPARISON:  Chest CT 86/57/8469 FINDINGS: Mediastinal blood pool activity: SUV max 2.5 Liver activity: SUV max NA NECK: Physiologic glottic activity. No significant worrisome neck activity is observed. Incidental CT findings: Bilateral common carotid atheromatous vascular calcification. CHEST: The 9 mm lesion of concern in the left upper lobe on image 74 series 4 has maximum SUV of 2.0. The right upper lobe nodule previously measured at 7.9 mm is shown on image 61 of series 4 and has a maximum SUV of 1.8. A right middle lobe lesion previously measured as 7.3 mm average diameter has maximum SUV of 2.5 Incidental CT findings: Coronary, aortic arch, and branch vessel atherosclerotic vascular disease. ABDOMEN/PELVIS: No significant abnormal hypermetabolic activity in this region. Incidental CT findings: Atherosclerosis is present,  including aortoiliac atherosclerotic disease. Postoperative findings along the posterior margin of the stomach. Chronic calcific pancreatitis. Small umbilical hernia contains adipose tissue. Small ventral hernia in the lower pelvis contains adipose tissue and a small loop of small bowel without findings of obstruction or complicating feature. Scattered colonic diverticulosis. SKELETON: No significant abnormal hypermetabolic activity in this region. Incidental CT findings: Cervical plate and screw fixator. IMPRESSION: 1. The 3 pulmonary nodules of concern have maximum SUV varying between 1.8 and 2.5. 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 2. Chronic calcific pancreatitis. 3. Small umbilical  hernia contains adipose tissue. 4. Small ventral hernia in the lower pelvis contains adipose tissue and a small loop of small bowel without findings of obstruction or complicating feature. 5. Scattered colonic diverticulosis. 6. Aortic and systemic atherosclerosis. Aortic Atherosclerosis (ICD10-I70.0). Electronically Signed   By: Gaylyn Rong M.D.   On: 08/22/2023 09:27     Past medical hx Past Medical History:  Diagnosis Date   Arthritis    Cervical spinal stenosis    Complication of anesthesia    "makes me cry for days."   Diverticulitis    Family history of anesthesia complication    Sister has nausea and vomitting.   Hypercholesteremia    Hypertension      Social History   Tobacco Use   Smoking status: Every Day    Current packs/day: 1.00    Average packs/day: 1 pack/day for 54.4 years (54.4 ttl pk-yrs)    Types: Cigarettes    Start date: 04/12/1969   Smokeless tobacco: Never  Vaping Use   Vaping status: Never Used  Substance Use Topics   Alcohol use: No    Alcohol/week: 0.0 standard drinks of alcohol   Drug use: No    Ms.Borrell reports that she has been smoking cigarettes. She started smoking about 54 years ago. She has a 54.4 pack-year smoking history. She has never used smokeless tobacco. She reports that she does not drink alcohol and does not use drugs.  Tobacco Cessation:  Smoking cessation Counseling  The patient's current tobacco use: 6 cigarettes a day, 54 pack year smoking history The patient was advised to quit and impact of smoking on their health.  I assessed the patient's willingness to attempt to quit.  I provided methods and skills for cessation. Resources to help quit smoking were provided, see AVS. A smoking cessation quit date was set: Advised to quit completely asap Follow-up was arranged in our clinic.  Counseled x  4 minutes today in clinic   Past surgical hx, Family hx, Social hx all reviewed.  Current Outpatient Medications on File  Prior to Visit  Medication Sig   acetaminophen (TYLENOL) 500 MG tablet Take 500 mg by mouth every 6 (six) hours as needed for mild pain, moderate pain, fever or headache.   ALPRAZolam (XANAX) 0.5 MG tablet Take 0.25 tablets by mouth every 6 (six) hours as needed for anxiety or sleep. anxiety   Ascorbic Acid (VITAMIN C PO) Take 1 tablet by mouth 3 (three) times a week.   calcium carbonate (OSCAL) 1500 (600 Ca) MG TABS tablet 1 tablet with meals   cetirizine (ZYRTEC ALLERGY) 10 MG tablet Take 1 tablet (10 mg total) by mouth daily.   Cholecalciferol (VITAMIN D) 50 MCG (2000 UT) tablet Take 2,000 Units by mouth daily.    Ferrous Sulfate (SLOW FE) 142 (45 Fe) MG TBCR Take 1 tablet by mouth 3 (three) times a week.   methocarbamol (  ROBAXIN) 500 MG tablet Take 1 tablet (500 mg total) by mouth 2 (two) times daily as needed for muscle spasms.   Multiple Vitamins-Iron (MULTI-VITAMIN/IRON PO) Take 1 tablet by mouth 3 (three) times a week.   propranolol (INDERAL) 20 MG tablet Take 20 mg by mouth 2 (two) times daily. (Patient not taking: Reported on 08/23/2023)   No current facility-administered medications on file prior to visit.     Allergies  Allergen Reactions   Lyrica [Pregabalin] Other (See Comments)    Made symptoms worse   Simvastatin Other (See Comments)    Myalgias   Statins Other (See Comments)    Myalgias   Meloxicam Other (See Comments)    Makes her sweat and muscle cramps     Review Of Systems:  Constitutional:   No  weight loss, night sweats,  Fevers, chills, fatigue, or  lassitude.  HEENT:   No headaches,  Difficulty swallowing,  Tooth/dental problems, or  Sore throat,                No sneezing, itching, ear ache, nasal congestion, post nasal drip,   CV:  No chest pain,  Orthopnea, PND, swelling in lower extremities, anasarca, dizziness, palpitations, syncope.   GI  No heartburn, indigestion, abdominal pain, nausea, vomiting, diarrhea, change in bowel habits, loss of appetite,  bloody stools.   Resp: No shortness of breath with exertion or at rest.  No excess mucus, no productive cough,  No non-productive cough,  No coughing up of blood.  No change in color of mucus.  No wheezing.  No chest wall deformity  Skin: no rash or lesions.  GU: no dysuria, change in color of urine, no urgency or frequency.  No flank pain, no hematuria   MS:  No joint pain or swelling.  No decreased range of motion.  No back pain.  Psych:  No change in mood or affect. No depression or anxiety.  No memory loss.   Vital Signs BP 136/78 (BP Location: Left Arm, Cuff Size: Normal)   Pulse 93   Ht 5\' 5"  (1.651 m)   Wt 175 lb 12.8 oz (79.7 kg)   SpO2 99%   BMI 29.25 kg/m    Physical Exam:  General- No distress,  A&Ox3, pleasant and anxious ENT: No sinus tenderness, TM clear, pale nasal mucosa, no oral exudate,no post nasal drip, no LAN Cardiac: S1, S2, regular rate and rhythm, no murmur Chest: No wheeze/ rales/ dullness; no accessory muscle use, no nasal flaring, no sternal retractions, slightly diminished per bases Abd.: Soft Non-tender, ND, BS +, Body mass index is 29.25 kg/m.  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 No symptoms of lung cancer. No evidence of spread to lymph nodes on PET scan. -Schedule bronchoscopic biopsy under general anesthesia. - Letter with details provided  - Discussed risks and benefits of bronchoscopy with biopsies -Follow-up one week post-procedure for biopsy results and to assess patient's condition.  Tobacco Use Patient is a current smoker, smoking approximately six cigarettes per day. Acknowledges need to quit. -Encouraged to gradually reduce cigarette  use and consider nicotine replacement therapy (gum or patch). -Provided information for 1-800-QUIT-NOW for access to free nicotine replacement therapy.  Social Support Patient has  limited social support and expressed concern about arranging transportation and post-procedure care for upcoming biopsy. -Encouraged patient to seek assistance from family, friends, or church community for transportation and post-procedure care.   Follow Up and Procedure information   August 23, 2023   [facility] LO[facility] [address] [place] [address]   Dear Ms. Tasia Catchings,  The following has been scheduled for you:   Procedure: Bronchoscopy                 Surgeon:  Dr Delton Coombes  Procedure date:  09/05/23 at 3:00 pm  Arrival Time:  12:30 pm Location: [facility] [address], Entrance [address] [place], [place] [address]   Additional Information: Do not eat anything after midnight - ok to take medications on the day of procedure with only sips of water You will need someone to drive you home after this procedure & to be with you for the next 24 hours You may receive a call from the hospital a day or two prior to procedure to go over additional instructions & medications  NEXT VISIT:  09/13/23 arriveat 1:15 pm With Kandice Robinsons, NP at    [facility]    [address] [place], [place]  [address]  I spent 40 minutes dedicated to the care of this patient on the date of this encounter to include pre-visit review of records, face-to-face time with the patient discussing conditions above, post visit ordering of testing, clinical documentation with the electronic health record, making appropriate referrals as documented, and communicating necessary information to the patient's healthcare team.    Bevelyn Ngo, NP 08/23/2023  9:15 AM

## 2023-08-23 NOTE — Patient Instructions (Signed)
 It is good to see you today. Your PET scan has 3 nodules that are concerning for malignancy. We will need to biopsy these areas.  We will give you a letter today with the time and location of your biopsy. We discussed the risk of infection, bleeding, adverse reaction to anesthesia, and you have agreed to the procedure. You will follow up with me one week after the procedure for results of the biopsy and to make sure you are doing well.  Please contact office for sooner follow up if symptoms do not improve or worsen or seek emergency care

## 2023-08-24 DIAGNOSIS — K080 Exfoliation of teeth due to systemic causes: Secondary | ICD-10-CM | POA: Diagnosis not present

## 2023-08-29 ENCOUNTER — Encounter: Payer: Self-pay | Admitting: Acute Care

## 2023-09-02 ENCOUNTER — Encounter (HOSPITAL_COMMUNITY): Payer: Self-pay | Admitting: Emergency Medicine

## 2023-09-02 NOTE — Progress Notes (Signed)
SDW call  Patient was given pre-op instructions over the phone. Patient verbalized understanding of instructions provided.     PCP - Dr. Ardean Larsen Cardiologist -  Pulmonary:    PPM/ICD - denies Device Orders - na Rep Notified - na   Chest x-ray - 07/18/2023 EKG -  DOS, 09/05/2023 Stress Test -8/23/20217 ECHO -  Cardiac Cath -   Sleep Study/sleep apnea/CPAP: denies  Non-diabetic   Blood Thinner Instructions: denies Aspirin Instructions:denies   ERAS Protcol - NPO  Anesthesia review: No   Patient denies shortness of breath, fever, cough and chest pain over the phone call  Your procedure is scheduled on Monday September 05, 2023  Report to [facility] Main Entrance "A" at 1200 Noon, then check in with the Admitting office.  Call this number if you have problems the morning of surgery:  [phone]   If you have any questions prior to your surgery date call [phone]: Open Monday-Friday 8am-4pm If you experience any cold or flu symptoms such as cough, fever, chills, shortness of breath, etc. between now and your scheduled surgery, please notify us at the above number    Remember:  Do not eat or drink after midnight the night before your surgery  Take these medicines if needed the morning of surgery with A SIP OF WATER:  Tylenol, xanax, robaxin  As of today, STOP taking any Aspirin (unless otherwise instructed by your surgeon) Aleve, Naproxen, Ibuprofen, Motrin, Advil, Goody's, BC's, all herbal medications, fish oil, and all vitamins.

## 2023-09-05 ENCOUNTER — Encounter (HOSPITAL_COMMUNITY)
Admission: RE | Disposition: A | Discharge status: Home / Self Care | Payer: Self-pay | Source: Home / Self Care | Attending: Emergency Medicine

## 2023-09-05 ENCOUNTER — Ambulatory Visit (HOSPITAL_COMMUNITY)
Admission: RE | Admit: 2023-09-05 | Discharge: 2023-09-05 | Disposition: A | Discharge status: Home / Self Care | Payer: Medicare Other | Attending: Emergency Medicine | Admitting: Emergency Medicine

## 2023-09-05 ENCOUNTER — Ambulatory Visit (HOSPITAL_COMMUNITY): Payer: Self-pay | Admitting: Anesthesiology

## 2023-09-05 ENCOUNTER — Other Ambulatory Visit: Payer: Self-pay

## 2023-09-05 ENCOUNTER — Ambulatory Visit (HOSPITAL_BASED_OUTPATIENT_CLINIC_OR_DEPARTMENT_OTHER): Payer: Self-pay | Admitting: Anesthesiology

## 2023-09-05 ENCOUNTER — Encounter (HOSPITAL_COMMUNITY): Payer: Self-pay | Admitting: Emergency Medicine

## 2023-09-05 ENCOUNTER — Ambulatory Visit (HOSPITAL_COMMUNITY): Payer: Medicare Other

## 2023-09-05 DIAGNOSIS — C3412 Malignant neoplasm of upper lobe, left bronchus or lung: Secondary | ICD-10-CM | POA: Diagnosis not present

## 2023-09-05 DIAGNOSIS — K439 Ventral hernia without obstruction or gangrene: Secondary | ICD-10-CM | POA: Diagnosis not present

## 2023-09-05 DIAGNOSIS — F17210 Nicotine dependence, cigarettes, uncomplicated: Secondary | ICD-10-CM

## 2023-09-05 DIAGNOSIS — Z48813 Encounter for surgical aftercare following surgery on the respiratory system: Secondary | ICD-10-CM | POA: Diagnosis not present

## 2023-09-05 DIAGNOSIS — E7800 Pure hypercholesterolemia, unspecified: Secondary | ICD-10-CM | POA: Diagnosis not present

## 2023-09-05 DIAGNOSIS — I10 Essential (primary) hypertension: Secondary | ICD-10-CM | POA: Diagnosis not present

## 2023-09-05 DIAGNOSIS — R918 Other nonspecific abnormal finding of lung field: Secondary | ICD-10-CM | POA: Diagnosis present

## 2023-09-05 DIAGNOSIS — K429 Umbilical hernia without obstruction or gangrene: Secondary | ICD-10-CM | POA: Diagnosis not present

## 2023-09-05 DIAGNOSIS — K861 Other chronic pancreatitis: Secondary | ICD-10-CM | POA: Diagnosis not present

## 2023-09-05 DIAGNOSIS — C3481 Malignant neoplasm of overlapping sites of right bronchus and lung: Secondary | ICD-10-CM | POA: Diagnosis not present

## 2023-09-05 DIAGNOSIS — C342 Malignant neoplasm of middle lobe, bronchus or lung: Secondary | ICD-10-CM | POA: Diagnosis not present

## 2023-09-05 DIAGNOSIS — R911 Solitary pulmonary nodule: Secondary | ICD-10-CM | POA: Diagnosis not present

## 2023-09-05 DIAGNOSIS — C3411 Malignant neoplasm of upper lobe, right bronchus or lung: Secondary | ICD-10-CM | POA: Diagnosis not present

## 2023-09-05 DIAGNOSIS — K5730 Diverticulosis of large intestine without perforation or abscess without bleeding: Secondary | ICD-10-CM | POA: Diagnosis not present

## 2023-09-05 DIAGNOSIS — I700 Atherosclerosis of aorta: Secondary | ICD-10-CM | POA: Diagnosis not present

## 2023-09-05 HISTORY — PX: BRONCHIAL NEEDLE ASPIRATION BIOPSY: SHX5106

## 2023-09-05 HISTORY — DX: Prediabetes: R73.03

## 2023-09-05 HISTORY — PX: BRONCHIAL BIOPSY: SHX5109

## 2023-09-05 HISTORY — PX: FIDUCIAL MARKER PLACEMENT: SHX6858

## 2023-09-05 HISTORY — PX: BRONCHIAL BRUSHINGS: SHX5108

## 2023-09-05 LAB — BASIC METABOLIC PANEL
Anion gap: 11 (ref 5–15)
BUN: 19 mg/dL (ref 8–23)
CO2: 20 mmol/L — ABNORMAL LOW (ref 22–32)
Calcium: 8.8 mg/dL — ABNORMAL LOW (ref 8.9–10.3)
Chloride: 103 mmol/L (ref 98–111)
Creatinine, Ser: 0.99 mg/dL (ref 0.44–1.00)
GFR, Estimated: 60 mL/min — ABNORMAL LOW (ref >60)
Glucose, Bld: 85 mg/dL (ref 70–99)
Potassium: 4.7 mmol/L (ref 3.5–5.1)
Sodium: 134 mmol/L — ABNORMAL LOW (ref 135–145)

## 2023-09-05 LAB — CBC
HCT: 45.4 % (ref 36.0–46.0)
Hemoglobin: 15.1 g/dL — ABNORMAL HIGH (ref 12.0–15.0)
MCH: 32.3 pg (ref 26.0–34.0)
MCHC: 33.3 g/dL (ref 30.0–36.0)
MCV: 97.2 fL (ref 80.0–100.0)
Platelets: 235 10*3/uL (ref 150–400)
RBC: 4.67 MIL/uL (ref 3.87–5.11)
RDW: 13.2 % (ref 11.5–15.5)
WBC: 9.3 10*3/uL (ref 4.0–10.5)
nRBC: 0 % (ref 0.0–0.2)

## 2023-09-05 SURGERY — BRONCHOSCOPY, WITH BIOPSY USING ELECTROMAGNETIC NAVIGATION
Anesthesia: General

## 2023-09-05 MED ORDER — ACETAMINOPHEN 500 MG PO TABS
ORAL_TABLET | ORAL | Status: AC
  Administered 2023-09-05: 1000 mg via ORAL
  Filled 2023-09-05: qty 2

## 2023-09-05 MED ORDER — DEXAMETHASONE SODIUM PHOSPHATE 10 MG/ML IJ SOLN
INTRAMUSCULAR | Status: DC | PRN
  Administered 2023-09-05: 10 mg via INTRAVENOUS

## 2023-09-05 MED ORDER — PHENYLEPHRINE 80 MCG/ML (10ML) SYRINGE FOR IV PUSH (FOR BLOOD PRESSURE SUPPORT)
PREFILLED_SYRINGE | INTRAVENOUS | Status: DC | PRN
  Administered 2023-09-05: 80 ug via INTRAVENOUS

## 2023-09-05 MED ORDER — ACETAMINOPHEN 500 MG PO TABS: 1000.0000 mg | ORAL_TABLET | Freq: Once | ORAL | Status: AC

## 2023-09-05 MED ORDER — LACTATED RINGERS IV SOLN: INTRAVENOUS | Status: DC

## 2023-09-05 MED ORDER — ROCURONIUM BROMIDE 10 MG/ML (PF) SYRINGE
PREFILLED_SYRINGE | INTRAVENOUS | Status: DC | PRN
  Administered 2023-09-05: 10 mg via INTRAVENOUS
  Administered 2023-09-05: 50 mg via INTRAVENOUS
  Administered 2023-09-05: 20 mg via INTRAVENOUS

## 2023-09-05 MED ORDER — SUGAMMADEX SODIUM 200 MG/2ML IV SOLN
INTRAVENOUS | Status: DC | PRN
  Administered 2023-09-05: 200 mg via INTRAVENOUS

## 2023-09-05 MED ORDER — CHLORHEXIDINE GLUCONATE 0.12 % MT SOLN
OROMUCOSAL | Status: AC
  Administered 2023-09-05: 15 mL
  Filled 2023-09-05: qty 15

## 2023-09-05 MED ORDER — PHENYLEPHRINE HCL (PRESSORS) 10 MG/ML IV SOLN
INTRAVENOUS | Status: DC | PRN
  Administered 2023-09-05: 160 ug via INTRAVENOUS

## 2023-09-05 MED ORDER — ONDANSETRON HCL 4 MG/2ML IJ SOLN
INTRAMUSCULAR | Status: DC | PRN
  Administered 2023-09-05: 4 mg via INTRAVENOUS

## 2023-09-05 MED ORDER — PHENYLEPHRINE HCL-NACL 20-0.9 MG/250ML-% IV SOLN
INTRAVENOUS | Status: DC | PRN
  Administered 2023-09-05: 20 ug/min via INTRAVENOUS

## 2023-09-05 MED ORDER — PROPOFOL 10 MG/ML IV BOLUS
INTRAVENOUS | Status: DC | PRN
  Administered 2023-09-05: 50 mg via INTRAVENOUS
  Administered 2023-09-05: 120 mg via INTRAVENOUS

## 2023-09-05 MED ORDER — FENTANYL CITRATE (PF) 100 MCG/2ML IJ SOLN
INTRAMUSCULAR | Status: AC
  Filled 2023-09-05: qty 2

## 2023-09-05 MED ORDER — FENTANYL CITRATE (PF) 250 MCG/5ML IJ SOLN
INTRAMUSCULAR | Status: DC | PRN
  Administered 2023-09-05 (×2): 50 ug via INTRAVENOUS

## 2023-09-05 MED ORDER — LIDOCAINE 2% (20 MG/ML) 5 ML SYRINGE
INTRAMUSCULAR | Status: DC | PRN
  Administered 2023-09-05: 60 mg via INTRAVENOUS

## 2023-09-05 MED ORDER — PROPOFOL 500 MG/50ML IV EMUL
INTRAVENOUS | Status: DC | PRN
  Administered 2023-09-05: 150 ug/kg/min via INTRAVENOUS

## 2023-09-05 SURGICAL SUPPLY — 2 items
Super Lock Fiducial Marker IMPLANT
Super Lock Fiducual Marker IMPLANT

## 2023-09-05 NOTE — Discharge Instructions (Signed)
Flexible Bronchoscopy, Care After This sheet gives you information about how to care for yourself after your test. Your doctor may also give you more specific instructions. If you have problems or questions, contact your doctor. Follow these instructions at home: Eating and drinking Do not eat or drink anything (not even water) for 2 hours after your test, or until your numbing medicine (local anesthetic) wears off. When your numbness is gone and your cough and gag reflexes have come back, you may: Eat only soft foods. Slowly drink liquids. When you get home from the test, go back to your normal diet. Driving Do not drive for 24 hours if you were given a medicine to help you relax (sedative). Do not drive or use heavy machinery while taking prescription pain medicine. General instructions  Take over-the-counter and prescription medicines only as told by your doctor. Return to your normal activities as told. Ask what activities are safe for you. Do not use any products that have nicotine or tobacco in them. This includes cigarettes and e-cigarettes. If you need help quitting, ask your doctor. Keep all follow-up visits as told by your doctor. This is important. It is very important if you had a tissue sample (biopsy) taken. Get help right away if: You have shortness of breath that gets worse. You get light-headed. You feel like you are going to pass out (faint). You have chest pain. You cough up: More than a little blood. More blood than before. Summary Do not eat or drink anything (not even water) for 2 hours after your test, or until your numbing medicine wears off. Do not use cigarettes. Do not use e-cigarettes. Get help right away if you have chest pain.  Please call our office for any questions or concerns.  [phone].  This information is not intended to replace advice given to you by your health care provider. Make sure you discuss any questions you have with your health care  provider. Document Released: 05/30/2009 Document Revised: 07/15/2017 Document Reviewed: 08/20/2016 Elsevier Patient Education  2020 [employer].

## 2023-09-05 NOTE — Transfer of Care (Signed)
Immediate Anesthesia Transfer of Care Note  Patient: [name]  Procedure(s) Performed: ROBOTIC ASSISTED NAVIGATIONAL BRONCHOSCOPY BRONCHIAL NEEDLE ASPIRATION BIOPSIES BRONCHIAL BRUSHINGS BRONCHIAL BIOPSIES FIDUCIAL MARKER PLACEMENT  Patient Location: PACU  Anesthesia Type:General  Level of Consciousness: drowsy  Airway & Oxygen Therapy: Patient Spontanous Breathing  Post-op Assessment: Report given to RN and Post -op Vital signs reviewed and stable  Post vital signs: Reviewed and stable  Last Vitals:  Vitals Value Taken Time  BP 137/95 09/05/23 1510  Temp 36.4 C 09/05/23 1456  Pulse 96 09/05/23 1514  Resp 19 09/05/23 1514  SpO2 93 % 09/05/23 1514  Vitals shown include unfiled device data.  Last Pain:  Vitals:   09/05/23 1510  TempSrc:   PainSc: 0-No pain         Complications: No notable events documented.

## 2023-09-05 NOTE — Interval H&P Note (Signed)
History and Physical Interval Note:  09/05/2023 12:44 PM  [name]  has presented today for surgery, with the diagnosis of lung nodule.  The various methods of treatment have been discussed with the patient and family. After consideration of risks, benefits and other options for treatment, the patient has consented to  Procedure(s): ROBOTIC ASSISTED NAVIGATIONAL BRONCHOSCOPY (N/A) as a surgical intervention.  The patient's history has been reviewed, patient examined, no change in status, stable for surgery.  I have reviewed the patient's chart and labs.  Questions were answered to the patient's satisfaction.     Leslye Peer

## 2023-09-05 NOTE — Op Note (Signed)
Video Bronchoscopy with Robotic Assisted Bronchoscopic Navigation   Date of Operation: 09/05/2023   Pre-op Diagnosis: Bilateral pulmonary nodules  Post-op Diagnosis: Same  Surgeon: Levy Pupa  Assistants: None  Anesthesia: General endotracheal anesthesia  Operation: Flexible video fiberoptic bronchoscopy with robotic assistance and biopsies.  Estimated Blood Loss: Minimal  Complications: None  Indications and History: [name] is a 73 y.o. female with history of history of tobacco use follow renal cancer screening program.  She has slowly enlarging bilateral pulmonary nodules on serial imaging concerning for possible malignancy.  PET scan was negative for hypermetabolic activity.  Recommendation made to achieve tissue diagnosis via robotic assisted navigational bronchoscopy. The risks, benefits, complications, treatment options and expected outcomes were discussed with the patient.  The possibilities of pneumothorax, pneumonia, reaction to medication, pulmonary aspiration, perforation of a viscus, bleeding, failure to diagnose a condition and creating a complication requiring transfusion or operation were discussed with the patient who freely signed the consent.    Description of Procedure: The patient was seen in the Preoperative Area, was examined and was deemed appropriate to proceed.  The patient was taken to [facility] endoscopy room 3, identified as [name] and the procedure verified as Flexible Video Fiberoptic Bronchoscopy.  A Time Out was held and the above information confirmed.   Prior to the date of the procedure a high-resolution CT scan of the chest was performed. Utilizing ION software program a virtual tracheobronchial tree was generated to allow the creation of distinct navigation pathways to the patient's parenchymal abnormalities. After being taken to the operating room general anesthesia was initiated and the patient  was orally intubated. The video fiberoptic  bronchoscope was introduced via the endotracheal tube and a general inspection was performed which showed normal right and left lung anatomy. Aspiration of the bilateral mainstems was completed to remove any remaining secretions. Robotic catheter inserted into patient's endotracheal tube.   Target #1 left upper lobe nodule: The distinct navigation pathways prepared prior to this procedure were then utilized to navigate to patient's lesion identified on CT scan. The robotic catheter was secured into place and the vision probe was withdrawn.  Lesion location was approximated using fluoroscopy.  Local registration and targeting was performed using Cios three-dimensional imaging. Under fluoroscopic guidance transbronchial needle brushings, transbronchial needle biopsies, and transbronchial forceps biopsies were performed to be sent for cytology and pathology.  Fluoroscopic guidance a single visual marker was placed adjacent to the nodule.  Target #2 right upper lobe nodule: The distinct navigation pathways prepared prior to this procedure were then utilized to navigate to patient's lesion identified on CT scan. The robotic catheter was secured into place and the vision probe was withdrawn.  Lesion location was approximated using fluoroscopy.  Local registration and targeting was performed using Cios three-dimensional imaging. Under fluoroscopic guidance transbronchial needle brushings, transbronchial needle biopsies, and transbronchial forceps biopsies were performed to be sent for cytology and pathology.  Fluoroscopic guidance a single visual marker was placed adjacent to the nodule.  Target #3 right middle lobe nodule: The distinct navigation pathways prepared prior to this procedure were then utilized to navigate to patient's lesion identified on CT scan. The robotic catheter was secured into place and the vision probe was withdrawn.  Lesion location was approximated using fluoroscopy.  Local registration  and targeting was performed using Cios three-dimensional imaging. Under fluoroscopic guidance transbronchial needle brushings, transbronchial needle biopsies, and transbronchial forceps biopsies were performed to be sent for cytology and pathology.  Fluoroscopic  guidance a single visual marker was placed adjacent to the nodule.   At the end of the procedure a general airway inspection was performed and there was no evidence of active bleeding. The bronchoscope was removed.  The patient tolerated the procedure well. There was no significant blood loss and there were no obvious complications. A post-procedural chest x-ray is pending.  Samples Target #1: 1. Transbronchial needle brushings from left upper lobe nodule 2. Transbronchial Wang needle biopsies from left upper lobe nodule 3. Transbronchial forceps biopsies from left upper lobe nodule  Samples Target #2: 1. Transbronchial needle brushings from right upper lobe nodule 2. Transbronchial Wang needle biopsies from right upper lobe nodule 3. Transbronchial forceps biopsies from right upper lobe nodule  Samples Target #3: 1. Transbronchial needle brushings from right middle lobe nodule 2. Transbronchial Wang needle biopsies from right middle lobe nodule 3. Transbronchial forceps biopsies from right middle lobe nodule   Plans:  The patient will be discharged from the PACU to home when recovered from anesthesia and after chest x-ray is reviewed. We will review the cytology, pathology and microbiology results with the patient when they become available. Outpatient followup will be with Saralyn Pilar, NP.   Levy Pupa, MD, PhD 09/05/2023, 2:46 PM [facility] [phone] or if no answer before 7:00PM call [phone] For any issues after 7:00PM please call eLink [phone]

## 2023-09-05 NOTE — Anesthesia Procedure Notes (Signed)
Procedure Name: Intubation Date/Time: 09/05/2023 1:07 PM  Performed by: Camillia Herter, CRNAPre-anesthesia Checklist: Patient identified, Emergency Drugs available, Suction available and Patient being monitored Patient Re-evaluated:Patient Re-evaluated prior to induction Oxygen Delivery Method: Circle System Utilized Preoxygenation: Pre-oxygenation with 100% oxygen Induction Type: IV induction Ventilation: Mask ventilation without difficulty Laryngoscope Size: Mac and 3 Grade View: Grade II Tube type: Oral Tube size: 8.5 mm Number of attempts: 1 Airway Equipment and Method: Stylet and Oral airway Placement Confirmation: ETT inserted through vocal cords under direct vision, positive ETCO2 and breath sounds checked- equal and bilateral Secured at: 21 cm Tube secured with: Tape Dental Injury: Teeth and Oropharynx as per pre-operative assessment

## 2023-09-05 NOTE — Anesthesia Preprocedure Evaluation (Addendum)
Anesthesia Evaluation  Patient identified by MRN, date of birth, ID band Patient awake    Reviewed: Allergy & Precautions, NPO status , Patient's Chart, lab work & pertinent test results  Airway Mallampati: III  TM Distance: >3 FB Neck ROM: Full    Dental  (+) Dental Advisory Given, Missing   Pulmonary Current Smoker and Patient abstained from smoking. lung nodule    Pulmonary exam normal breath sounds clear to auscultation       Cardiovascular hypertension, Normal cardiovascular exam Rhythm:Regular Rate:Normal     Neuro/Psych  PSYCHIATRIC DISORDERS Anxiety      Neuromuscular disease    GI/Hepatic negative GI ROS, Neg liver ROS,,,  Endo/Other  negative endocrine ROS    Renal/GU negative Renal ROS     Musculoskeletal  (+) Arthritis ,    Abdominal   Peds  Hematology negative hematology ROS (+)   Anesthesia Other Findings Day of surgery medications reviewed with the patient.  Reproductive/Obstetrics                             Anesthesia Physical Anesthesia Plan  ASA: 3  Anesthesia Plan: General   Post-op Pain Management: Tylenol PO (pre-op)*   Induction: Intravenous  PONV Risk Score and Plan: 2 and Dexamethasone and Ondansetron  Airway Management Planned: Oral ETT  Additional Equipment:   Intra-op Plan:   Post-operative Plan: Extubation in OR  Informed Consent: I have reviewed the patients History and Physical, chart, labs and discussed the procedure including the risks, benefits and alternatives for the proposed anesthesia with the patient or authorized representative who has indicated his/her understanding and acceptance.     Dental advisory given  Plan Discussed with: CRNA  Anesthesia Plan Comments:        Anesthesia Quick Evaluation

## 2023-09-06 NOTE — Anesthesia Postprocedure Evaluation (Signed)
Anesthesia Post Note  Patient: ADREE MCCALEB  Procedure(s) Performed: ROBOTIC ASSISTED NAVIGATIONAL BRONCHOSCOPY BRONCHIAL NEEDLE ASPIRATION BIOPSIES BRONCHIAL BRUSHINGS BRONCHIAL BIOPSIES FIDUCIAL MARKER PLACEMENT     Patient location during evaluation: PACU Anesthesia Type: General Level of consciousness: awake and alert Pain management: pain level controlled Vital Signs Assessment: post-procedure vital signs reviewed and stable Respiratory status: spontaneous breathing, nonlabored ventilation, respiratory function stable and patient connected to nasal cannula oxygen Cardiovascular status: blood pressure returned to baseline and stable Postop Assessment: no apparent nausea or vomiting Anesthetic complications: no   No notable events documented.  Last Vitals:  Vitals:   09/05/23 1510 09/05/23 1520  BP: (!) 137/95 (!) 151/78  Pulse: 84 90  Resp: 18 (!) 23  Temp:    SpO2: 92% 91%    Last Pain:  Vitals:   09/05/23 1520  TempSrc:   PainSc: 0-No pain   Pain Goal:                   Collene Schlichter

## 2023-09-07 ENCOUNTER — Encounter (HOSPITAL_COMMUNITY): Payer: Self-pay | Admitting: Emergency Medicine

## 2023-09-07 LAB — CYTOLOGY - NON PAP

## 2023-09-09 ENCOUNTER — Telehealth: Payer: Self-pay | Admitting: Emergency Medicine

## 2023-09-09 NOTE — Telephone Encounter (Signed)
Spoke with the patient regarding her bronchoscopy results.  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  Will need to review the data with radiation oncology and oncology.  She has follow-up in our office next week

## 2023-09-13 ENCOUNTER — Ambulatory Visit: Payer: Medicare Other | Admitting: Acute Care

## 2023-09-14 ENCOUNTER — Ambulatory Visit: Payer: Medicare Other | Admitting: Acute Care

## 2023-09-14 ENCOUNTER — Encounter: Payer: Self-pay | Admitting: Acute Care

## 2023-09-14 VITALS — BP 139/82 | HR 80 | Temp 98.9°F | Ht 65.0 in | Wt 172.6 lb

## 2023-09-14 DIAGNOSIS — F17200 Nicotine dependence, unspecified, uncomplicated: Secondary | ICD-10-CM | POA: Diagnosis not present

## 2023-09-14 DIAGNOSIS — C3480 Malignant neoplasm of overlapping sites of unspecified bronchus and lung: Secondary | ICD-10-CM

## 2023-09-14 DIAGNOSIS — Z9889 Other specified postprocedural states: Secondary | ICD-10-CM | POA: Diagnosis not present

## 2023-09-14 DIAGNOSIS — C3490 Malignant neoplasm of unspecified part of unspecified bronchus or lung: Secondary | ICD-10-CM | POA: Diagnosis not present

## 2023-09-14 NOTE — Patient Instructions (Addendum)
It is good to see you today. Your biopsies were positive for 2 different types of lung cancer.    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 I have referred you to radiation oncology and also to medical oncology. You will get a call to get these appointments scheduled.  Please call Dr. Rush Farmer office to see if it is ok to have an MRI of your brain with the cervical spine fusion. I have ordered the scan, as long as he says it is ok to do the MRI, we can proceed.  If he says no because of hardware, let me know so we can change the scan to a CT Head.  They will take great care of you at the cancer center.  Good luck with your treatment.  Work on quitting smoking. You can receive free nicotine replacement therapy (patches, gum, or mints) by calling 1-800-QUIT NOW. Please call so we can get you on the path to becoming a non-smoker. I know it is hard, but you can do this!  Hypnosis for smoking cessation  [employer] [phone]  Acupuncture for smoking cessation  [employer] [phone]   Please contact office for sooner follow up if symptoms do not improve or worsen or seek emergency care

## 2023-09-14 NOTE — Progress Notes (Signed)
History of Present Illness [name] is a 73 y.o. female  current every day smoker a 54.4 pack year smoking history referred for abnormal pulmonary nodules 08/2023. She had follow up PET imaging 08/08/2023.  She will be followed by Dr. Delton Coombes.   Synopsis 73 year old current everyday smoker with a 54.4-pack-year smoking history referred for abnormal pulmonary nodules in January 2025.  Follow-up pet imaging showed the 3 mm pulmonary nodule of concern had an SUV max between 1.8 and 2.5.  In the context of morphologic enlargement this appearance raises concerns for low-grade adenocarcinoma. Patient underwent flexible video fiberoptic bronchoscopy with robotic assistance and biopsies on September 05, 2023.   09/14/2023 Pt. Presents for follow up after bronchoscopy with biopsies 09/05/2023.  Patient states she has done well after the procedure.  She denies bleeding, fever, infection, or adverse reaction to anesthesia.  Her respiratory status is at her baseline. We have reviewed the results of her biopsy.  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  Patient verbalized understanding of the diagnosis. We discussed that her PET scan was reassuring and it is possible that these represent 3 distinct stage I primary lung cancers.  We discussed that she may be a potential candidate for SBRT which is radiation to the nodules.  She has been referred to radiation oncology and also to medical oncology for consultation and determination of best options for treatment. Plan will be for MR brain for staging. We have also talked at length about smoking cessation in the setting of a diagnosis of lung cancer.  I have counseled the patient for 3 to 4 minutes today and I have given her treatment options for smoking cessation.  Please see after visit summary Test Results: Cytology 09/05/2023  CYTOLOGY - NON PAP CASE:  [id number] PATIENT: [name] Non-Gynecological Cytology Report     Clinical History: lung nodule    FINAL MICROSCOPIC DIAGNOSIS: A.  LEFT LUNG, UPPER LOBE, NODULE, BRUSHING: - Malignant - Adenocarcinoma  B. LEFT LUNG, UPPER LOBE, NODULE, FINE NEEDLE ASPIRATION  BIOPSY: - Malignant - Adenocarcinoma   Two immunohistochemical stains performed with adequate control on the  left upper lobe fine-needle aspiration (B).  The neoplastic cells are  positive for the pulmonary adeno marker TTF-1 and are negative for the  squamous marker p40.  This immunohistochemical pattern supports the  above diagnosis.     C.  RIGHT LUNG, UPPER LOBE, NODULE, BRUSHING:  - Malignant  - Adenocarcinoma   D. RIGHT LUNG, UPPER LOBE, NODULE, FINE NEEDLE ASPIRATION  BIOPSY:  - Malignant  - Adenocarcinoma   E.  RIGHT LUNG, MIDDLE LOBE, NODULE, BRUSHING:  - Malignant  - Squamous carcinoma   F. RIGHT LUNG, MIDDLE LOBE, NODULE, FINE NEEDLE ASPIRATION  BIOPSY:  - Malignant  - Squamous carcinoma  Two immunohistochemical stains are performed with adequate control on both of the cell blocks of the fine-needle aspirations (D and F). Within the right upper lobe fine-needle aspiration (D) the neoplastic cells are positive for the pulmonary adeno marker TTF-1 and are negative for the squamous marker p40 within the right middle lobe FNA (F) the neoplastic cells are positive for the squamous marker p40 and negative for the pulmonary adeno marker TTF-1.  These immunohistochemical stains support the above rendered diagnoses.    08/08/2023 PET  The 3 pulmonary nodules of concern have maximum SUV varying between 1.8 and 2.5. In the context  of morphologic enlargement the appearance raises concern for low-grade adenocarcinoma. 2. Chronic calcific pancreatitis. 3. Small umbilical hernia contains adipose tissue. 4. Small ventral hernia in the lower pelvis contains adipose tissue and a small loop of small  bowel without findings of obstruction or complicating feature. 5. Scattered colonic diverticulosis. 6. Aortic and systemic atherosclerosis.   LDCT 06/08/2023 read 07/05/2023 Cardiovascular: The heart size is normal. No substantial pericardial effusion. Coronary artery calcification is evident. Moderate atherosclerotic calcification is noted in the wall of the thoracic aorta.   Mediastinum/Nodes: No mediastinal lymphadenopathy. No evidence for gross hilar lymphadenopathy although assessment is limited by the lack of intravenous contrast on the current study. The esophagus has normal imaging features. There is no axillary lymphadenopathy.   Lungs/Pleura: Bilateral pulmonary nodules again identified.   The 9.3 mm irregular left upper lobe nodule seen is progressive on the previous study shows continued further increase in size measuring 10.6 mm today (image 136) compared to 9.3 mm previously.   Dominant irregular mixed attenuation right upper lobe nodule is also progressive in the interval with confluent nodular central component now measuring 7.9 mm (image 88) compared to 5 mm previously.   Solid right middle lobe nodule measuring 7.3 mm today on image 178 was 5.2 mm previously and 4.6 mm on 03/04/2022.   Additional scattered tiny solid and ground-glass opacities are seen bilaterally.   Upper Abdomen: Nodular thickening of the left thyroid gland is stable. 13 mm exophytic lesion upper pole left kidney characterized on recent CT abdomen/pelvis as benign cyst.   Musculoskeletal: No worrisome lytic or sclerotic osseous abnormality.   IMPRESSION: 1. Lung-RADS 4B, suspicious. Bilateral progressive pulmonary nodules including a mixed attenuation right upper lobe lesion. Additional imaging evaluation or consultation with Pulmonology or Thoracic Surgery recommended. 2.  Aortic Atherosclerosis (ICD10-170.0)       RADIOLOGY Chest CT: Right upper lobe nodule 7.9 mm, right middle  lobe nodule 7.3 mm, left upper lobe nodule PET scan: Three nodules in upper lobe with hypermetabolic activity, concerning for low-grade adenocarcinoma, no hypermetabolic lymph nodes       Latest Ref Rng & Units 09/05/2023   12:28 PM 05/26/2023    4:40 AM 10/19/2021   12:25 PM  CBC  WBC 4.0 - 10.5 K/uL 9.3  9.6  11.2   Hemoglobin 12.0 - 15.0 g/dL 95.6  21.3  08.6   Hematocrit 36.0 - 46.0 % 45.4  47.1  47.1   Platelets 150 - 400 K/uL 235  258         Latest Ref Rng & Units 09/05/2023   12:28 PM 05/26/2023    4:40 AM 10/19/2021   12:25 PM  BMP  Glucose 70 - 99 mg/dL 85  578  74   BUN 8 - 23 mg/dL 19  17  20    Creatinine 0.44 - 1.00 mg/dL 4.69  6.29  5.28   BUN/Creat Ratio 12 - 28   19   Sodium 135 - 145 mmol/L 134  137  139   Potassium 3.5 - 5.1 mmol/L 4.7  3.7  4.8   Chloride 98 - 111 mmol/L 103  103  101   CO2 22 - 32 mmol/L 20  25  23    Calcium 8.9 - 10.3 mg/dL 8.8  8.7  9.3     BNP No results found for: "BNP"  ProBNP No results found for: "PROBNP"  PFT No results found for: "FEV1PRE", "FEV1POST", "FVCPRE", "FVCPOST", "TLC", "DLCOUNC", "PREFEV1FVCRT", "PSTFEV1FVCRT"  DG Chest [facility] [address]  Result Date: 09/05/2023 CLINICAL DATA:  Status post bronchoscopy EXAM: PORTABLE CHEST 1 VIEW COMPARISON:  X-ray 07/18/2023 and older.  PET-CT 08/08/2023. FINDINGS: Interval placement of marker seen corresponding to areas of lung nodularity including right upper lung, right lower lung and left perihilar. Please correlate with prior CT. Slightly coarse interstitial lung markings. No pneumothorax, effusion or edema. Stable cardiopericardial silhouette. Overlapping cardiac leads. The extreme inferior costophrenic angle on the left is clipped off the edge of the film. Fixation hardware along the cervical spine at the edge of the imaging field. IMPRESSION: Placement of metallic markers seen in each lung. No pneumothorax or effusion. Please correlate with the bronchoscopy findings and prior imaging  Electronically Signed   By: Karen Kays M.D.   On: 09/05/2023 15:58   DG C-ARM BRONCHOSCOPY Result Date: 09/05/2023 C-ARM BRONCHOSCOPY: Fluoroscopy was utilized by the requesting physician.  No radiographic interpretation.   DG C-Arm 1-60 Min-No Report Result Date: 09/05/2023 Fluoroscopy was utilized by the requesting physician.  No radiographic interpretation.     Past medical hx Past Medical History:  Diagnosis Date   Arthritis    Cervical spinal stenosis    Diverticulitis    Family history of anesthesia complication    Sister has nausea and vomitting.   Hypercholesteremia    Hypertension    Pre-diabetes      Social History   Tobacco Use   Smoking status: Every Day    Current packs/day: 1.00    Average packs/day: 1 pack/day for 54.4 years (54.4 ttl pk-yrs)    Types: Cigarettes    Start date: 04/12/1969   Smokeless tobacco: Never  Vaping Use   Vaping status: Never Used  Substance Use Topics   Alcohol use: No    Alcohol/week: 0.0 standard drinks of alcohol   Drug use: No    Ms.Kirchman reports that she has been smoking cigarettes. She started smoking about 54 years ago. She has a 54.4 pack-year smoking history. She has never used smokeless tobacco. She reports that she does not drink alcohol and does not use drugs.  Tobacco Cessation: Ready to quit: Yes Counseling given: Yes Patient is a current every day smoker she has a 54.4-pack-year smoking history She has been counseled today for 3 to 4 minutes on the need for smoking cessation.  Past surgical hx, Family hx, Social hx all reviewed.  Current Outpatient Medications on File Prior to Visit  Medication Sig   acetaminophen (TYLENOL) 500 MG tablet Take 500 mg by mouth every 6 (six) hours as needed for mild pain, moderate pain, fever or headache.   ALPRAZolam (XANAX) 0.5 MG tablet Take 0.25 tablets by mouth every 6 (six) hours as needed for anxiety or sleep. anxiety   Ascorbic Acid (VITAMIN C PO) Take 1 tablet by mouth  3 (three) times a week.   Cholecalciferol (VITAMIN D) 50 MCG (2000 UT) tablet Take 2,000 Units by mouth daily.    Ferrous Sulfate (SLOW FE) 142 (45 Fe) MG TBCR Take 1 tablet by mouth 3 (three) times a week.   methocarbamol (ROBAXIN) 500 MG tablet Take 1 tablet (500 mg total) by mouth 2 (two) times daily as needed for muscle spasms.   Multiple Vitamins-Iron (MULTI-VITAMIN/IRON PO) Take 1 tablet by mouth 3 (three) times a week.   No current facility-administered medications on file prior to visit.     Allergies  Allergen Reactions   Lyrica [Pregabalin] Other (See Comments)    Made symptoms worse   Simvastatin Other (See Comments)  Myalgias   Statins Other (See Comments)    Myalgias   Meloxicam Other (See Comments)    Makes her sweat and muscle cramps     Review Of Systems:  Constitutional:   No  weight loss, night sweats,  Fevers, chills, fatigue, or  lassitude.  HEENT:   No headaches,  Difficulty swallowing,  Tooth/dental problems, or  Sore throat,                No sneezing, itching, ear ache, nasal congestion, post nasal drip,   CV:  No chest pain,  Orthopnea, PND, swelling in lower extremities, anasarca, dizziness, palpitations, syncope.   GI  No heartburn, indigestion, abdominal pain, nausea, vomiting, diarrhea, change in bowel habits, loss of appetite, bloody stools.   Resp: No shortness of breath with exertion or at rest.  No excess mucus, no productive cough,  No non-productive cough,  No coughing up of blood.  No change in color of mucus.  No wheezing.  No chest wall deformity  Skin: no rash or lesions.  GU: no dysuria, change in color of urine, no urgency or frequency.  No flank pain, no hematuria   MS:  No joint pain or swelling.  No decreased range of motion.  No back pain.  Psych:  No change in mood or affect. No depression or anxiety.  No memory loss.   Vital Signs BP 139/82 (BP Location: Left Arm, Patient Position: Sitting, Cuff Size: Large)   Pulse 80    Temp 98.9 F (37.2 C) (Oral)   Ht 5\' 5"  (1.651 m)   Wt 172 lb 9.6 oz (78.3 kg)   SpO2 96%   BMI 28.72 kg/m    Physical Exam:  General- No distress,  A&Ox3, pleasant ENT: No sinus tenderness, TM clear, pale nasal mucosa, no oral exudate,no post nasal drip, no LAN Cardiac: S1, S2, regular rate and rhythm, no murmur Chest: No wheeze/ rales/ dullness; no accessory muscle use, no nasal flaring, no sternal retractions Abd.: Soft Non-tender, nondistended, bowel sounds positive,Body mass index is 28.72 kg/m.  Ext: No clubbing cyanosis, edema Neuro:  normal strength, moving all extremities x 4, alert and oriented x 3, appropriate Skin: No rashes, warm and dry, no obvious skin lesions Psych: normal mood and behavior   Assessment/Plan New diagnosis adenocarcinoma per left upper lobe and right upper lobe New diagnosis squamous cell carcinoma right middle lobe Current everyday smoker Plan Your biopsies were positive for 2 different types of lung cancer.    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 I have referred you to radiation oncology and also to medical oncology. You will get a call to get these appointments scheduled.  Please call Dr. Rush Farmer office to see if it is ok to have an MRI of your brain with the cervical spine fusion. I have ordered the scan, as long as he says it is ok to do the MRI, we can proceed.  If he says no because of hardware, let me know so we can change the scan to a CT Head.  They will take great care of you at the cancer center.  Good luck with your treatment.  Work on quitting smoking. You  can receive free nicotine replacement therapy (patches, gum, or  mints) by calling 1-800-QUIT NOW. Please call so we can get you on the path to becoming a non-smoker. I know it is hard, but you can do this!  Hypnosis for smoking cessation  [employer] [phone]  Acupuncture for smoking cessation  [employer] [phone]   Please contact office for sooner follow up if symptoms do not improve or worsen or seek emergency care    I spent 35 minutes dedicated to the care of this patient on the date of this encounter to include pre-visit review of records, face-to-face time with the patient discussing conditions above, post visit ordering of testing, clinical documentation with the electronic health record, making appropriate referrals as documented, and communicating necessary information to the patient's healthcare team.   Bevelyn Ngo, NP 09/14/2023  2:16 PM

## 2023-09-15 ENCOUNTER — Telehealth: Payer: Self-pay | Admitting: Radiation Oncology

## 2023-09-15 ENCOUNTER — Other Ambulatory Visit: Payer: Self-pay

## 2023-09-15 NOTE — Telephone Encounter (Signed)
1/30 @ 9:20 am Left voicemail for patient to call our office to be schedule for consult.

## 2023-09-16 NOTE — Progress Notes (Signed)
 The proposed treatment discussed in conference is for discussion purpose only and is not a binding recommendation.  The patients have not been physically examined, or presented with their treatment options.  Therefore, final treatment plans cannot be decided.

## 2023-09-18 ENCOUNTER — Encounter: Payer: Self-pay | Admitting: Acute Care

## 2023-09-19 NOTE — Progress Notes (Signed)
 Thoracic Location of Tumor / Histology: Right Lung (upper/lower lobe)  Patient presented as referral by Dara Ear, NP ( Pulmonary)  09/05/2023 Dr. Racheal Buddle DG Chest Port 1 View CLINICAL DATA: Status post bronchoscopy   FINDINGS: Interval placement of marker seen corresponding to areas of lung nodularity including right upper lung, right lower lung and left perihilar. Please correlate with prior CT. Slightly coarse interstitial lung markings. No pneumothorax, effusion or edema. Stable cardiopericardial silhouette. Overlapping cardiac leads. The extreme inferior costophrenic angle on the left is clipped off the edge of the film. Fixation hardware along the cervical spine at the edge of the imaging field.   IMPRESSION: Placement of metallic markers seen in each lung. No pneumothorax or effusion. Please correlate with the bronchoscopy findings and prior Imaging.   08/08/2023 Dr. Kathrine Paris NM PET Image Initial (PI) Skull Base to Thigh CLINICAL DATA: Initial treatment strategy for pulmonary nodule.   IMPRESSION: 1. The 3 pulmonary nodules of concern have maximum SUV varying between 1.8 and 2.5. 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 2. Chronic calcific pancreatitis. 3. Small umbilical hernia contains adipose tissue. 4. Small ventral hernia in the lower pelvis contains adipose tissue and a small loop of small bowel without findings of obstruction or complicating feature. 5. Scattered colonic diverticulosis. 6. Aortic and systemic atherosclerosis. Aortic Atherosclerosis (ICD10-I70.0).  Past/Anticipated interventions by cardiothoracic surgery, if any:  09/05/2023 Dr. Racheal Buddle DG C-ARM Bronchoscopy  Past/Anticipated interventions by medical oncology, if any: NA  Tobacco/Marijuana/Snuff/ETOH use: Smoker, no drug, alcohol  or snuff use.  Signs/Symptoms Weight changes, if any:  Yes, weight loss 40  lbs.  Intended change in diet. Respiratory complaints, if any: No Hemoptysis, if any: No Pain issues, if any:  0/10  SAFETY ISSUES: Prior radiation? No Pacemaker/ICD?  No Possible current pregnancy? Hysterectomy Is the patient on methotrexate? No  Current Complaints / other details:

## 2023-09-20 ENCOUNTER — Other Ambulatory Visit: Payer: Self-pay | Admitting: *Deleted

## 2023-09-20 DIAGNOSIS — R918 Other nonspecific abnormal finding of lung field: Secondary | ICD-10-CM

## 2023-09-20 NOTE — Progress Notes (Signed)
 [facility] [phone]) [phone]   [phone]  CONSULT NOTE  REFERRING PHYSICIAN: Lauraine Lites NP  REASON FOR CONSULTATION:  Small cell lung cancer, adenocarcinoma and squamous cell carcinoma  HPI [name] is a 73 y.o. female past medical history significant for essential tremor, arthritis, chronic pancreatitis, diverticulosis, and vitamin D  deficiency is referred to the clinic for lung cancer.   The patient was being followed for pulmonary nodules. The patient had a low-dose lung cancer screening CT scan on 06/08/23 showing bilateral progressive pulmonary nodules including a mixed attenuation right upper lobe lesion.   She subsequently had a PET scan performed on 08/08/2023 to further characterize this. The left upper lobe 9 mm lesion has SUV max of 2.0.  The right upper lobe pulmonary nodule measured 7.9 mm and has an SUV of 1.8 and the right middle lobe lesion measures 7.3 mm and has an SUV of 2.5.  She establish care with pulmonary medicine on 08/23/2023.  She subsequently had a bronchoscopy and biopsy on 09/05/2023 under the care of Dr. Shelah.  The final pathology showed that all 3 pulmonary nodules were positive for malignancy. The left upper lobe nodule was consistent with adenocarcinoma.  The right upper lobe nodule was consistent with adenocarcinoma and the right middle lobe nodule was consistent with squamous cell carcinoma.  Therefore, this could be 3 distinct stage I primary lung malignancies.  Her case was discussed at the multidisciplinary conference.  The patient is scheduled to see Dr. Patrcia from radiation oncology on 09/26/2023 to consider SBRT to all 3 lesions.   She denies any fever, chills, or night sweats.  The patient states she weighed approximately 220 pounds 1 year ago.  She reports she is progressively losing weight due to not feeling as hungry and making some dietary modifications.  He states her breathing is fine.  She denies  any significant dyspnea on exertion.  She did develop a cough after a cold in December 2024 and does have intermittent cough at times.  She denies any hemoptysis or chest pain.  She has a couple bony lesions on her left shoulder and near her xiphoid process that she is wondering if they are concerning for malignancy.  He has frequent constipation for which she takes Metamucil, stool softener, and laxatives if needed.  She reports she has not been as physically active after the holidays but used to participate in senior exercise programs.  She states she feels better when she does participate in activity and exercise.  She denies any nausea, vomiting, or diarrhea.  Denies any headache or visual changes.  Denies any rashes or skin changes.  The patient reports that her mother passed away secondary to old age.  Her father also passed away due to old age but reportedly had coronary artery disease status post several angioplasties.  She has a sister who also was found to have an early stage lung cancer.  The patient used to work heritage manager.  She is widowed.  She has 1 son.  She is a current smoker but is interested in quitting.  She ordered nicotine replacement therapy with gum.  She has been smoking over 50 years averaging 1 pack of cigarettes per day.  Denies any drug or alcohol  use.   HPI  Past Medical History:  Diagnosis Date   Arthritis    Cervical spinal stenosis    Diverticulitis    Family history of anesthesia complication    Sister has nausea  and vomitting.   Hypercholesteremia    Hypertension    Pre-diabetes     Past Surgical History:  Procedure Laterality Date   ABDOMINAL HYSTERECTOMY  1995   ANTERIOR CERVICAL DECOMP/DISCECTOMY FUSION  12/30/2011   BACK SURGERY  1992   BRONCHIAL BIOPSY  09/05/2023   Procedure: BRONCHIAL BIOPSIES;  Surgeon: Shelah Lamar RAMAN, MD;  Location: [facility] ENDOSCOPY;  Service: Pulmonary;;   BRONCHIAL BRUSHINGS  09/05/2023   Procedure: BRONCHIAL BRUSHINGS;   Surgeon: Shelah Lamar RAMAN, MD;  Location: [facility] ENDOSCOPY;  Service: Pulmonary;;   BRONCHIAL NEEDLE ASPIRATION BIOPSY  09/05/2023   Procedure: BRONCHIAL NEEDLE ASPIRATION BIOPSIES;  Surgeon: Shelah Lamar RAMAN, MD;  Location: [facility] ENDOSCOPY;  Service: Pulmonary;;   COLECTOMY  2004   lost 1/3 of my colon   COLONOSCOPY WITH PROPOFOL  N/A 08/28/2021   Procedure: COLONOSCOPY WITH PROPOFOL ;  Surgeon: Rollin Dover, MD;  Location: [facility] ENDOSCOPY;  Service: Endoscopy;  Laterality: N/A;   FIDUCIAL MARKER PLACEMENT  09/05/2023   Procedure: FIDUCIAL MARKER PLACEMENT;  Surgeon: Shelah Lamar RAMAN, MD;  Location: [facility] ENDOSCOPY;  Service: Pulmonary;;   HEMOSTASIS CLIP PLACEMENT  08/28/2021   Procedure: HEMOSTASIS CLIP PLACEMENT;  Surgeon: Rollin Dover, MD;  Location: [facility] ENDOSCOPY;  Service: Endoscopy;;   HOT HEMOSTASIS N/A 08/28/2021   Procedure: HOT HEMOSTASIS (ARGON PLASMA COAGULATION/BICAP);  Surgeon: Rollin Dover, MD;  Location: [facility]COPY;  Service: Endoscopy;  Laterality: N/A;   POLYPECTOMY  08/28/2021   Procedure: POLYPECTOMY;  Surgeon: Rollin Dover, MD;  Location: [facility] ENDOSCOPY;  Service: Endoscopy;;   THORACIC FUSION  1992   TUMOR REMOVAL     small intestine    Family History  Problem Relation Age of Onset   Anesthesia problems Sister    Allergic rhinitis Sister    Food Allergy Sister        shellfish   Lung cancer Sister        smoker   Angioedema Neg Hx    Asthma Neg Hx    Eczema Neg Hx    Urticaria Neg Hx    Breast cancer Neg Hx     Social History Social History   Tobacco Use   Smoking status: Every Day    Current packs/day: 1.00    Average packs/day: 1 pack/day for 54.4 years (54.4 ttl pk-yrs)    Types: Cigarettes    Start date: 04/12/1969   Smokeless tobacco: Never  Vaping Use   Vaping status: Never Used  Substance Use Topics   Alcohol  use: No    Alcohol /week: 0.0 standard drinks of alcohol    Drug use: No    Allergies  Allergen Reactions   Lyrica [Pregabalin] Other (See  Comments)    Made symptoms worse   Simvastatin  Other (See Comments)    Myalgias   Statins Other (See Comments)    Myalgias   Meloxicam Other (See Comments)    Makes her sweat and muscle cramps     Current Outpatient Medications  Medication Sig Dispense Refill   acetaminophen  (TYLENOL ) 500 MG tablet Take 500 mg by mouth every 6 (six) hours as needed for mild pain, moderate pain, fever or headache.     ALPRAZolam  (XANAX ) 0.5 MG tablet Take 0.25 tablets by mouth every 6 (six) hours as needed for anxiety or sleep. anxiety  0   Ascorbic Acid  (VITAMIN C  PO) Take 1 tablet by mouth 3 (three) times a week.     Cholecalciferol (VITAMIN D ) 50 MCG (2000 UT) tablet Take 2,000 Units by mouth daily.  Ferrous Sulfate  (SLOW FE) 142 (45 Fe) MG TBCR Take 1 tablet by mouth 3 (three) times a week.     methocarbamol  (ROBAXIN ) 500 MG tablet Take 1 tablet (500 mg total) by mouth 2 (two) times daily as needed for muscle spasms. 20 tablet 0   Multiple Vitamins-Iron (MULTI-VITAMIN/IRON PO) Take 1 tablet by mouth 3 (three) times a week.     No current facility-administered medications for this visit.    REVIEW OF SYSTEMS:   Review of Systems  Constitutional: Positive for fatigue. Negative for appetite change, chills, fever and unexpected weight change.  HENT: Negative for mouth sores, nosebleeds, sore throat and trouble swallowing.   Eyes: Negative for eye problems and icterus.  Respiratory: Positive for cough. Negative for hemoptysis, shortness of breath and wheezing.   Cardiovascular: Negative for chest pain and leg swelling.  Gastrointestinal: Negative for abdominal pain, constipation, diarrhea, nausea and vomiting.  Genitourinary: Negative for bladder incontinence, difficulty urinating, dysuria, frequency and hematuria.   Musculoskeletal: Negative for back pain, gait problem, neck pain and neck stiffness.  Skin: Negative for itching and rash.  Neurological: Negative for dizziness, extremity weakness,  gait problem, headaches, light-headedness and seizures.  Hematological: Negative for adenopathy. Does not bruise/bleed easily.  Psychiatric/Behavioral: Negative for confusion, depression and sleep disturbance. The patient is not nervous/anxious.     PHYSICAL EXAMINATION:  Blood pressure 137/82, pulse 70, temperature 97.8 F (36.6 C), temperature source Temporal, resp. rate 14, weight 172 lb 11.2 oz (78.3 kg), SpO2 97%.  ECOG PERFORMANCE STATUS: 1  Physical Exam  Constitutional: Oriented to person, place, and time and well-developed, well-nourished, and in no distress.  HENT:  Head: Normocephalic and atraumatic.  Mouth/Throat: Oropharynx is clear and moist. No oropharyngeal exudate.  Eyes: Conjunctivae are normal. Right eye exhibits no discharge. Left eye exhibits no discharge. No scleral icterus.  Neck: Normal range of motion. Neck supple.  Cardiovascular: Normal rate, regular rhythm, normal heart sounds and intact distal pulses.   Pulmonary/Chest: Effort normal. Quiet breath sounds bilaterally. No respiratory distress. No wheezes. No rales.  Abdominal: Soft. Bowel sounds are normal. Exhibits no distension and no mass. There is no tenderness.  Musculoskeletal: Normal range of motion. Exhibits no edema.  Lymphadenopathy:    No cervical adenopathy.  Neurological: Alert and oriented to person, place, and time. Exhibits normal muscle tone. Gait normal. Coordination normal.  Skin: Skin is warm and dry. No rash noted. Not diaphoretic. No erythema. No pallor.  Psychiatric: Tremor noted. Mood, memory and judgment normal.  Vitals reviewed.  LABORATORY DATA: Lab Results  Component Value Date   WBC 11.4 (H) 09/21/2023   HGB 14.9 09/21/2023   HCT 45.4 09/21/2023   MCV 96.8 09/21/2023   PLT 257 09/21/2023      Chemistry      Component Value Date/Time   NA 137 09/21/2023 1254   NA 139 10/19/2021 1225   K 4.2 09/21/2023 1254   CL 102 09/21/2023 1254   CO2 30 09/21/2023 1254   BUN 16  09/21/2023 1254   BUN 20 10/19/2021 1225   CREATININE 0.88 09/21/2023 1254      Component Value Date/Time   CALCIUM  9.5 09/21/2023 1254   ALKPHOS 49 09/21/2023 1254   AST 15 09/21/2023 1254   ALT 14 09/21/2023 1254   BILITOT 0.6 09/21/2023 1254       RADIOGRAPHIC STUDIES: DG Chest Port 1 View Result Date: 09/05/2023 CLINICAL DATA:  Status post bronchoscopy EXAM: PORTABLE CHEST 1 VIEW COMPARISON:  X-ray 07/18/2023  and older.  PET-CT 08/08/2023. FINDINGS: Interval placement of marker seen corresponding to areas of lung nodularity including right upper lung, right lower lung and left perihilar. Please correlate with prior CT. Slightly coarse interstitial lung markings. No pneumothorax, effusion or edema. Stable cardiopericardial silhouette. Overlapping cardiac leads. The extreme inferior costophrenic angle on the left is clipped off the edge of the film. Fixation hardware along the cervical spine at the edge of the imaging field. IMPRESSION: Placement of metallic markers seen in each lung. No pneumothorax or effusion. Please correlate with the bronchoscopy findings and prior imaging Electronically Signed   By: Ranell Bring M.D.   On: 09/05/2023 15:58   DG C-ARM BRONCHOSCOPY Result Date: 09/05/2023 C-ARM BRONCHOSCOPY: Fluoroscopy was utilized by the requesting physician.  No radiographic interpretation.   DG C-Arm 1-60 Min-No Report Result Date: 09/05/2023 Fluoroscopy was utilized by the requesting physician.  No radiographic interpretation.    ASSESSMENT: A very pleasant 73 year old Caucasian female referred to the clinic for 3 stage I primary lung malignancies.  She presented with a left upper lobe lung nodule consistent with non-small cell lung cancer, adenocarcinoma.  She had a right upper lobe pulmonary nodule consistent with non-small cell lung cancer, adenocarcinoma.  She had a right middle lobe lung nodule consistent with non-small cell lung cancer, squamous cell carcinoma.  Molecular  studies are pending.   The patient's case was discussed at the multidisciplinary conference.  Discussed SBRT to all 3 targets. She is scheduled to see Dr. Patrcia next week for consultation.   Dr. Sherrod recommended molecular studies should she have any progressive disease in the future help determine targeted therapy options if a candidate.  In the meantime, Dr. Sherrod explained that there is no systemic chemotherapy required for stage Ia non-small cell lung cancer.  Sherrod does recommend close monitoring with repeat CT scan in 4 months.  We will see the patient back for follow-up visit 1 week after the restaging CT scan to review the results.  We discussed smoking cessation.  She was strongly encouraged to quit smoking.  The patient had concerns about treatment for her lung cancer with her prior medical history.  I discussed with her that her prior medical problems (primarily gastrointestinal and her essential tremor) should not interfere with her treatment plan of SBRT.  The patient voices understanding of current disease status and treatment options and is in agreement with the current care plan.  All questions were answered. The patient knows to call the clinic with any problems, questions or concerns. We can certainly see the patient much sooner if necessary.  Thank you so much for allowing me to participate in the care of [name]. I will continue to follow up the patient with you and assist in her care.  Disclaimer: This note was dictated with voice recognition software. Similar sounding words can inadvertently be transcribed and may not be corrected upon review.   Elvina Bosch L Cathe Bilger September 21, 2023, 2:16 PM  ADDENDUM: Hematology/Oncology Attending: I had a face-to-face encounter with the patient today.  I reviewed her record, lab, scans and recommended her care plan.  This is a very pleasant 73 years old white female with history of chronic pancreatitis,  diverticulosis, vitamin D  deficiency as well as essential tremor and osteoarthritis.  The patient has been followed by her primary care physician and pulmonary medicine for suspicious pulmonary nodule that was seen on low-dose CT lung cancer screening.  On June 08, 2023 the scan showed progressive bilateral  pulmonary nodules involving the right upper lobe, left upper lobe as well as right middle lobe.  She had a PET scan on August 08, 2023 that showed low hypermetabolic activity in these nodules likely because of the small size.  The patient underwent bronchoscopy with biopsy of the 3 nodules under the care of Dr. Shelah.  The left upper lobe as well as the right upper lobe were consistent with adenocarcinoma of the lung but the right middle lobe was consistent with a squamous cell carcinoma. The patient is not a good candidate for surgical resection. I had a lengthy discussion with the patient today about her current condition and treatment options. We will treat these 3 nodules as separate lung primaries.  Initially presenting stage Ia non-small cell lung cancer. The patient was seen by Dr. Patrcia and she will undergo SBRT to the 3 pulmonary nodules. I will continue to monitor her closely with repeat CT scan of the chest in 4 months for restaging of her disease. She was advised to call immediately if she has any other concerning symptoms in the interval. The total time spent in the appointment was 60 minutes. Disclaimer: This note was dictated with voice recognition software. Similar sounding words can inadvertently be transcribed and may be missed upon review. Sherrod MARLA Sherrod, MD

## 2023-09-21 ENCOUNTER — Inpatient Hospital Stay: Payer: Medicare Other | Attending: Physician Assistant | Admitting: Physician Assistant

## 2023-09-21 ENCOUNTER — Inpatient Hospital Stay: Payer: Medicare Other

## 2023-09-21 VITALS — BP 137/82 | HR 70 | Temp 97.8°F | Resp 14 | Wt 172.7 lb

## 2023-09-21 DIAGNOSIS — M1990 Unspecified osteoarthritis, unspecified site: Secondary | ICD-10-CM | POA: Insufficient documentation

## 2023-09-21 DIAGNOSIS — E7800 Pure hypercholesterolemia, unspecified: Secondary | ICD-10-CM | POA: Diagnosis not present

## 2023-09-21 DIAGNOSIS — F17210 Nicotine dependence, cigarettes, uncomplicated: Secondary | ICD-10-CM | POA: Diagnosis not present

## 2023-09-21 DIAGNOSIS — C3490 Malignant neoplasm of unspecified part of unspecified bronchus or lung: Secondary | ICD-10-CM | POA: Insufficient documentation

## 2023-09-21 DIAGNOSIS — C342 Malignant neoplasm of middle lobe, bronchus or lung: Secondary | ICD-10-CM | POA: Diagnosis not present

## 2023-09-21 DIAGNOSIS — C3480 Malignant neoplasm of overlapping sites of unspecified bronchus and lung: Secondary | ICD-10-CM

## 2023-09-21 DIAGNOSIS — K861 Other chronic pancreatitis: Secondary | ICD-10-CM | POA: Insufficient documentation

## 2023-09-21 DIAGNOSIS — Z79899 Other long term (current) drug therapy: Secondary | ICD-10-CM | POA: Insufficient documentation

## 2023-09-21 DIAGNOSIS — C3412 Malignant neoplasm of upper lobe, left bronchus or lung: Secondary | ICD-10-CM

## 2023-09-21 DIAGNOSIS — I10 Essential (primary) hypertension: Secondary | ICD-10-CM | POA: Diagnosis not present

## 2023-09-21 DIAGNOSIS — C3411 Malignant neoplasm of upper lobe, right bronchus or lung: Secondary | ICD-10-CM | POA: Diagnosis not present

## 2023-09-21 DIAGNOSIS — R918 Other nonspecific abnormal finding of lung field: Secondary | ICD-10-CM

## 2023-09-21 LAB — CBC WITH DIFFERENTIAL (CANCER CENTER ONLY)
Abs Immature Granulocytes: 0.03 10*3/uL (ref 0.00–0.07)
Basophils Absolute: 0.1 10*3/uL (ref 0.0–0.1)
Basophils Relative: 1 %
Eosinophils Absolute: 0.2 10*3/uL (ref 0.0–0.5)
Eosinophils Relative: 2 %
HCT: 45.4 % (ref 36.0–46.0)
Hemoglobin: 14.9 g/dL (ref 12.0–15.0)
Immature Granulocytes: 0 %
Lymphocytes Relative: 25 %
Lymphs Abs: 2.9 10*3/uL (ref 0.7–4.0)
MCH: 31.8 pg (ref 26.0–34.0)
MCHC: 32.8 g/dL (ref 30.0–36.0)
MCV: 96.8 fL (ref 80.0–100.0)
Monocytes Absolute: 0.8 10*3/uL (ref 0.1–1.0)
Monocytes Relative: 7 %
Neutro Abs: 7.4 10*3/uL (ref 1.7–7.7)
Neutrophils Relative %: 65 %
Platelet Count: 257 10*3/uL (ref 150–400)
RBC: 4.69 MIL/uL (ref 3.87–5.11)
RDW: 13.1 % (ref 11.5–15.5)
WBC Count: 11.4 10*3/uL — ABNORMAL HIGH (ref 4.0–10.5)
nRBC: 0 % (ref 0.0–0.2)

## 2023-09-21 LAB — CMP (CANCER CENTER ONLY)
ALT: 14 U/L (ref 0–44)
AST: 15 U/L (ref 15–41)
Albumin: 3.6 g/dL (ref 3.5–5.0)
Alkaline Phosphatase: 49 U/L (ref 38–126)
Anion gap: 5 (ref 5–15)
BUN: 16 mg/dL (ref 8–23)
CO2: 30 mmol/L (ref 22–32)
Calcium: 9.5 mg/dL (ref 8.9–10.3)
Chloride: 102 mmol/L (ref 98–111)
Creatinine: 0.88 mg/dL (ref 0.44–1.00)
GFR, Estimated: >60 mL/min (ref >60)
Glucose, Bld: 108 mg/dL — ABNORMAL HIGH (ref 70–99)
Potassium: 4.2 mmol/L (ref 3.5–5.1)
Sodium: 137 mmol/L (ref 135–145)
Total Bilirubin: 0.6 mg/dL (ref 0.0–1.2)
Total Protein: 8.4 g/dL — ABNORMAL HIGH (ref 6.5–8.1)

## 2023-09-23 ENCOUNTER — Other Ambulatory Visit: Payer: Self-pay | Admitting: Acute Care

## 2023-09-23 DIAGNOSIS — C3490 Malignant neoplasm of unspecified part of unspecified bronchus or lung: Secondary | ICD-10-CM

## 2023-09-25 NOTE — Progress Notes (Signed)
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 Malignant neoplasm of right upper lobe of lung (HCC)  C34.11     2. Malignant neoplasm of lower lobe bronchus, right (HCC)  C34.31     3. Non-small cell carcinoma of lung, left (HCC)  C34.92     4. Non-small cell carcinoma of lung, right (HCC)  C34.91       HISTORY OF PRESENT ILLNESS: [name] is a 73 y.o. female seen at the request of Dr. Delton Coombes. She is a longtime smoker, followed in the lung cancer screening program since 02/2022. At that time, she was noted to have multiple scattered lung nodules (Lung-RADS 3) and was recommended to continue with short-term follow up. Her most recent follow up CT from 06/08/23 showed progression of the bilateral pulmonary nodules (Lung-RADS 4B). She underwent further evaluation with PET scan on 08/08/23 showing a maximum SUV of between 1.8 and 2.5 in the 3 pulmonary nodules of concern in the RUL, LUL and RML lung, raising concern for synchronous, low-grade adenocarcinoma. She was subsequently referred to Dr. Delton Coombes and underwent bronchoscopy on 09/05/23. 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  Her case was discussed in our multidisciplinary lung cancer conference on 09/15/23, and she also met with Dr. Arbutus Ped and his PA, Cassandra Heilingoetter, on 09/21/23.  She is scheduled for an MRI brain scan on 09/30/2023 to complete her disease staging and pending there are no unexpected findings, she appears to have synchronous, early stage lung cancers and  therefore, no systemic therapy is recommended at this time.  She has been kindly referred to [place] today to discuss the potential role for radiotherapy in the management of early-stage lung cancer.  PREVIOUS RADIATION THERAPY: No  PAST MEDICAL HISTORY:  Past Medical History:  Diagnosis Date   Arthritis    Cervical spinal stenosis    Diverticulitis    Family history of anesthesia complication    Sister has nausea and vomitting.   Hypercholesteremia    Hypertension    Pre-diabetes       PAST SURGICAL HISTORY: Past Surgical History:  Procedure Laterality Date   ABDOMINAL HYSTERECTOMY  1995   ANTERIOR CERVICAL DECOMP/DISCECTOMY FUSION  12/30/2011   BACK SURGERY  1992   BRONCHIAL BIOPSY  09/05/2023   Procedure: BRONCHIAL BIOPSIES;  Surgeon: Leslye Peer, MD;  Location: [facility] END[facility];  Service: Pulmonary;;   BRONCHIAL BRUSHINGS  09/05/2023   Procedure: BRONCHIAL BRUSHINGS;  Surgeon: Leslye Peer, MD;  Location: [facility] ENDOSCOPY;  Service: Pulmonary;;   BRONCHIAL NEEDLE ASPIRATION BIOPSY  09/05/2023   Procedure: BRONCHIAL NEEDLE ASPIRATION BIOPSIES;  Surgeon: Leslye Peer, MD;  Location: [facility] ENDOSCOPY;  Service: Pulmonary;;   COLECTOMY  2004   "lost 1/3 of my colon"   COLONOSCOPY WITH PROPOFOL N/A 08/28/2021   Procedure: COLONOSCOPY WITH PROPOFOL;  Surgeon: Jeani Hawking, MD;  Location: [facility] ENDOSCOPY;  Service: Endoscopy;  Laterality: N/A;   FIDUCIAL MARKER PLACEMENT  09/05/2023   Procedure: FIDUCIAL MARKER PLACEMENT;  Surgeon: Leslye Peer, MD;  Location:  [facility]OSCOPY;  Service: Pulmonary;;   HEMOSTASIS CLIP PLACEMENT  08/28/2021   Procedure: HEMOSTASIS CLIP PLACEMENT;  Surgeon: Jeani Hawking, MD;  Location: [facility] ENDOSCOPY;  Service: Endoscopy;;   HOT HEMOSTASIS N/A 08/28/2021   Procedure: HOT HEMOSTASIS (ARGON PLASMA COAGULATION/BICAP);  Surgeon: Jeani Hawking, MD;  Location: [facility]OSCOPY;  Service: Endoscopy;  Laterality: N/A;   POLYPECTOMY  08/28/2021   Procedure: POLYPECTOMY;   Surgeon: Jeani Hawking, MD;  Location: [facility] ENDOSCOPY;  Service: Endoscopy;;   THORACIC FUSION  1992   TUMOR REMOVAL     small intestine    FAMILY HISTORY:  Family History  Problem Relation Age of Onset   Anesthesia problems Sister    Allergic rhinitis Sister    Food Allergy Sister        shellfish   Lung cancer Sister        smoker   Angioedema Neg Hx    Asthma Neg Hx    Eczema Neg Hx    Urticaria Neg Hx    Breast cancer Neg Hx     SOCIAL HISTORY:  Social History   Socioeconomic History   Marital status: Divorced    Spouse name: Not on file   Number of children: Not on file   Years of education: Not on file   Highest education level: Not on file  Occupational History   Occupation: retired    Comment: Airline pilot, computer  Tobacco Use   Smoking status: Every Day    Current packs/day: 1.00    Average packs/day: 1 pack/day for 54.5 years (54.5 ttl pk-yrs)    Types: Cigarettes    Start date: 04/12/1969   Smokeless tobacco: Never  Vaping Use   Vaping status: Never Used  Substance and Sexual Activity   Alcohol use: No    Alcohol/week: 0.0 standard drinks of alcohol   Drug use: No   Sexual activity: Never  Other Topics Concern   Not on file  Social History Narrative   Not on file   Social Drivers of Health   Financial Resource Strain: Not on file  Food Insecurity: No Food Insecurity (09/26/2023)   Hunger Vital Sign    Worried About Running Out of Food in the Last Year: Never true    Ran Out of Food in the Last Year: Never true  Transportation Needs: No Transportation Needs (09/26/2023)   PRAPARE - Administrator, Civil Service (Medical): No    Lack of Transportation (Non-Medical): No  Physical Activity: Not on file  Stress: Not on file  Social Connections: Not on file  Intimate Partner Violence: Not At Risk (09/26/2023)   Humiliation, Afraid, Rape, and Kick questionnaire    Fear of Current or Ex-Partner: No    Emotionally Abused: No    Physically  Abused: No    Sexually Abused: No    ALLERGIES: Lyrica [pregabalin], Simvastatin, Statins, and Meloxicam  MEDICATIONS:  Current Outpatient Medications  Medication Sig Dispense Refill   acetaminophen (TYLENOL) 500 MG tablet Take 500 mg by mouth every 6 (six) hours as needed for mild pain, moderate pain, fever or headache.     ALPRAZolam (XANAX) 0.5 MG tablet Take 0.25 tablets by mouth every 6 (six) hours as needed for anxiety or sleep. anxiety  0   Ascorbic Acid (VITAMIN C PO) Take 1 tablet by mouth 3 (three) times a week.     BOOSTRIX 5-2.5-18.5 LF-MCG/0.5 injection      Cholecalciferol (VITAMIN D) 50 MCG (2000 UT) tablet Take 2,000 Units  by mouth daily.      Ferrous Sulfate (SLOW FE) 142 (45 Fe) MG TBCR Take 1 tablet by mouth 3 (three) times a week.     methocarbamol (ROBAXIN) 500 MG tablet Take 1 tablet (500 mg total) by mouth 2 (two) times daily as needed for muscle spasms. 20 tablet 0   Multiple Vitamins-Iron (MULTI-VITAMIN/IRON PO) Take 1 tablet by mouth 3 (three) times a week.     No current facility-administered medications for this encounter.    REVIEW OF SYSTEMS:  On review of systems, the patient reports that she is doing well overall. She denies any chest pain, shortness of breath, cough, fevers, chills, night sweats, unintended weight changes. She denies any bowel or bladder disturbances, and denies abdominal pain, nausea or vomiting. She denies any new musculoskeletal or joint aches or pains. A complete review of systems is obtained and is otherwise negative.    PHYSICAL EXAM:  Wt Readings from Last 3 Encounters:  09/26/23 173 lb 8 oz (78.7 kg)  09/21/23 172 lb 11.2 oz (78.3 kg)  09/14/23 172 lb 9.6 oz (78.3 kg)   Temp Readings from Last 3 Encounters:  09/26/23 (!) 96.9 F (36.1 C) (Temporal)  09/21/23 97.8 F (36.6 C) (Temporal)  09/14/23 98.9 F (37.2 C) (Oral)   BP Readings from Last 3 Encounters:  09/26/23 (!) 151/77  09/21/23 137/82  09/14/23 139/82    Pulse Readings from Last 3 Encounters:  09/26/23 76  09/21/23 70  09/14/23 80   Pain Assessment Pain Score: 0-No pain/10  In general this is a well appearing Caucasian woman in no acute distress. She's alert and oriented x4 and appropriate throughout the examination. Cardiopulmonary assessment is negative for acute distress and she exhibits normal effort.     KPS = 100  100 - Normal; no complaints; no evidence of disease. 90   - Able to carry on normal activity; minor signs or symptoms of disease. 80   - Normal activity with effort; some signs or symptoms of disease. 80   - Cares for self; unable to carry on normal activity or to do active work. 60   - Requires occasional assistance, but is able to care for most of his personal needs. 50   - Requires considerable assistance and frequent medical care. 40   - Disabled; requires special care and assistance. 30   - Severely disabled; hospital admission is indicated although death not imminent. 20   - Very sick; hospital admission necessary; active supportive treatment necessary. 10   - Moribund; fatal processes progressing rapidly. 0     - Dead  Karnofsky DA, Abelmann WH, Craver LS and Burchenal JH [phone]) The use of the nitrogen mustards in the palliative treatment of carcinoma: with particular reference to bronchogenic carcinoma Cancer 1 634-56  LABORATORY DATA:  Lab Results  Component Value Date   WBC 11.4 (H) 09/21/2023   HGB 14.9 09/21/2023   HCT 45.4 09/21/2023   MCV 96.8 09/21/2023   PLT 257 09/21/2023   Lab Results  Component Value Date   NA 137 09/21/2023   K 4.2 09/21/2023   CL 102 09/21/2023   CO2 30 09/21/2023   Lab Results  Component Value Date   ALT 14 09/21/2023   AST 15 09/21/2023   ALKPHOS 49 09/21/2023   BILITOT 0.6 09/21/2023     RADIOGRAPHY: DG Chest Port 1 View Result Date: 09/05/2023 CLINICAL DATA:  Status post bronchoscopy EXAM: PORTABLE CHEST 1 VIEW COMPARISON:  X-ray 07/18/2023 and  older.   PET-CT 08/08/2023. FINDINGS: Interval placement of marker seen corresponding to areas of lung nodularity including right upper lung, right lower lung and left perihilar. Please correlate with prior CT. Slightly coarse interstitial lung markings. No pneumothorax, effusion or edema. Stable cardiopericardial silhouette. Overlapping cardiac leads. The extreme inferior costophrenic angle on the left is clipped off the edge of the film. Fixation hardware along the cervical spine at the edge of the imaging field. IMPRESSION: Placement of metallic markers seen in each lung. No pneumothorax or effusion. Please correlate with the bronchoscopy findings and prior imaging Electronically Signed   By: Karen Kays M.D.   On: 09/05/2023 15:58   DG C-ARM BRONCHOSCOPY Result Date: 09/05/2023 C-ARM BRONCHOSCOPY: Fluoroscopy was utilized by the requesting physician.  No radiographic interpretation.   DG C-Arm 1-60 Min-No Report Result Date: 09/05/2023 Fluoroscopy was utilized by the requesting physician.  No radiographic interpretation.      IMPRESSION/PLAN: 1. 73 y.o. 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  Today, we talked to the patient about the findings and workup thus far. We discussed the natural history of synchronous early-stage lung cancer and general treatment, highlighting the role of radiotherapy in the management.  Her case and imaging were reviewed in the recent multidisciplinary thoracic oncology conference on 09/22/2023.  She is scheduled for a brain MRI on Friday, 09/30/2023 to complete her disease staging and pending there are no unexpected findings, consensus recommendation from the multidisciplinary thoracic oncology conference is to proceed with a 3 fraction course of stereotactic body radiotherapy (SBRT) to the RUL, RML and LUL lung nodules.  We discussed the available radiation techniques, and focused on the details and logistics of  delivery. We reviewed the anticipated acute and late sequelae associated with radiation in this setting. The patient was encouraged to ask questions that were answered to her stated satisfaction.  At the end of our conversation, the patient is in agreement to proceed with the recommended 3 fraction course of stereotactic body radiotherapy (SBRT) to the RUL, RML and LUL lung nodules.  We will share our discussion with Dr. Delton Coombes and Dr. Shirline Frees and proceed with CT simulation/treatment planning at 9 AM on Friday, 09/30/2023, in anticipation of beginning her treatments in the near future.  She appears to have a good understanding of her disease and our treatment recommendations and is comfortable and in agreement with the stated plan.  We enjoyed meeting her today and look forward to continuing to participate in her care.  We personally spent 60 minutes in this encounter including chart review, reviewing radiological studies, meeting face-to-face with the patient, entering orders and completing documentation.    Marguarite Arbour, PA-C    Margaretmary Dys, MD  [facility]  Radiation Oncology Direct Dial: [phone]  Fax: [phone] [email or web address]  Skype  LinkedIn   This document serves as a record of services personally performed by Margaretmary Dys, MD and Marcello Fennel, PA-C. It was created on their behalf by Mickie Bail, a trained medical scribe. The creation of this record is based on the scribe's personal observations and the provider's statements to them. This document has been checked and approved by the attending provider.

## 2023-09-26 ENCOUNTER — Ambulatory Visit
Admission: RE | Admit: 2023-09-26 | Discharge: 2023-09-26 | Disposition: A | Discharge status: Home / Self Care | Payer: Medicare Other | Source: Ambulatory Visit | Attending: Radiation Oncology | Admitting: Radiation Oncology

## 2023-09-26 ENCOUNTER — Encounter: Payer: Self-pay | Admitting: Radiation Oncology

## 2023-09-26 ENCOUNTER — Ambulatory Visit: Admission: RE | Admit: 2023-09-26 | Payer: Medicare Other | Source: Ambulatory Visit | Admitting: Radiation Oncology

## 2023-09-26 ENCOUNTER — Ambulatory Visit: Payer: Medicare Other

## 2023-09-26 ENCOUNTER — Ambulatory Visit: Payer: Medicare Other | Admitting: Radiation Oncology

## 2023-09-26 VITALS — BP 151/77 | HR 76 | Temp 96.9°F | Resp 18 | Ht 65.0 in | Wt 173.5 lb

## 2023-09-26 DIAGNOSIS — C3431 Malignant neoplasm of lower lobe, right bronchus or lung: Secondary | ICD-10-CM

## 2023-09-26 DIAGNOSIS — F17210 Nicotine dependence, cigarettes, uncomplicated: Secondary | ICD-10-CM | POA: Diagnosis not present

## 2023-09-26 DIAGNOSIS — M129 Arthropathy, unspecified: Secondary | ICD-10-CM | POA: Insufficient documentation

## 2023-09-26 DIAGNOSIS — C3411 Malignant neoplasm of upper lobe, right bronchus or lung: Secondary | ICD-10-CM

## 2023-09-26 DIAGNOSIS — I10 Essential (primary) hypertension: Secondary | ICD-10-CM | POA: Insufficient documentation

## 2023-09-26 DIAGNOSIS — Z79899 Other long term (current) drug therapy: Secondary | ICD-10-CM | POA: Diagnosis not present

## 2023-09-26 DIAGNOSIS — E7800 Pure hypercholesterolemia, unspecified: Secondary | ICD-10-CM | POA: Insufficient documentation

## 2023-09-26 DIAGNOSIS — C3492 Malignant neoplasm of unspecified part of left bronchus or lung: Secondary | ICD-10-CM | POA: Insufficient documentation

## 2023-09-26 DIAGNOSIS — C3491 Malignant neoplasm of unspecified part of right bronchus or lung: Secondary | ICD-10-CM | POA: Insufficient documentation

## 2023-09-26 DIAGNOSIS — Z801 Family history of malignant neoplasm of trachea, bronchus and lung: Secondary | ICD-10-CM | POA: Insufficient documentation

## 2023-09-29 DIAGNOSIS — C3411 Malignant neoplasm of upper lobe, right bronchus or lung: Secondary | ICD-10-CM | POA: Diagnosis not present

## 2023-09-29 DIAGNOSIS — C3431 Malignant neoplasm of lower lobe, right bronchus or lung: Secondary | ICD-10-CM | POA: Diagnosis not present

## 2023-09-29 DIAGNOSIS — F17210 Nicotine dependence, cigarettes, uncomplicated: Secondary | ICD-10-CM | POA: Diagnosis not present

## 2023-09-29 DIAGNOSIS — C3491 Malignant neoplasm of unspecified part of right bronchus or lung: Secondary | ICD-10-CM | POA: Diagnosis not present

## 2023-09-30 ENCOUNTER — Encounter (HOSPITAL_COMMUNITY): Payer: Self-pay | Admitting: Internal Medicine

## 2023-09-30 ENCOUNTER — Ambulatory Visit
Admission: RE | Admit: 2023-09-30 | Discharge: 2023-09-30 | Disposition: A | Discharge status: Home / Self Care | Payer: Medicare Other | Source: Ambulatory Visit | Attending: Radiation Oncology | Admitting: Radiation Oncology

## 2023-09-30 ENCOUNTER — Ambulatory Visit (HOSPITAL_COMMUNITY)
Admission: RE | Admit: 2023-09-30 | Discharge: 2023-09-30 | Disposition: A | Discharge status: Home / Self Care | Payer: Medicare Other | Source: Ambulatory Visit | Attending: Acute Care | Admitting: Acute Care

## 2023-09-30 DIAGNOSIS — C3431 Malignant neoplasm of lower lobe, right bronchus or lung: Secondary | ICD-10-CM | POA: Diagnosis not present

## 2023-09-30 DIAGNOSIS — C3492 Malignant neoplasm of unspecified part of left bronchus or lung: Secondary | ICD-10-CM

## 2023-09-30 DIAGNOSIS — C3490 Malignant neoplasm of unspecified part of unspecified bronchus or lung: Secondary | ICD-10-CM | POA: Insufficient documentation

## 2023-09-30 DIAGNOSIS — F17210 Nicotine dependence, cigarettes, uncomplicated: Secondary | ICD-10-CM | POA: Diagnosis not present

## 2023-09-30 DIAGNOSIS — Z8673 Personal history of transient ischemic attack (TIA), and cerebral infarction without residual deficits: Secondary | ICD-10-CM | POA: Diagnosis not present

## 2023-09-30 DIAGNOSIS — C3491 Malignant neoplasm of unspecified part of right bronchus or lung: Secondary | ICD-10-CM

## 2023-09-30 DIAGNOSIS — C3411 Malignant neoplasm of upper lobe, right bronchus or lung: Secondary | ICD-10-CM | POA: Insufficient documentation

## 2023-09-30 DIAGNOSIS — C342 Malignant neoplasm of middle lobe, bronchus or lung: Secondary | ICD-10-CM | POA: Insufficient documentation

## 2023-09-30 MED ORDER — GADOBUTROL 1 MMOL/ML IV SOLN
7.0000 mL | Freq: Once | INTRAVENOUS | Status: AC | PRN
  Administered 2023-09-30: 7 mL via INTRAVENOUS

## 2023-09-30 NOTE — Progress Notes (Signed)
  Radiation Oncology         [phone] ________________________________  Name: [name] MRN: [id number]  Date: 09/30/2023  DOB: [birth year]  STEREOTACTIC BODY RADIOTHERAPY SIMULATION AND TREATMENT PLANNING NOTE    ICD-10-CM   1. Non-small cell carcinoma of lung, left (HCC)  C34.92     2. 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  Identity was confirmed.  All relevant records and images related to the planned course of therapy were reviewed.  The patient freely provided informed written consent to proceed with treatment after reviewing the details related to the planned course of therapy. The consent form was witnessed and verified by the simulation staff.  Then, the patient was set-up in a stable reproducible  supine position for radiation therapy.  A BodyFix immobilization pillow was fabricated for reproducible positioning.  Then I personally applied the abdominal compression paddle to limit respiratory excursion.  4D respiratoy motion management CT images were obtained.  Surface markings were placed.  The CT images were loaded into the planning software.  Then, using Cine, MIP, and standard views, the internal target volume (ITV) and planning target volumes (PTV) were delinieated, and avoidance structures were contoured.  Treatment planning then occurred.  The radiation prescription was entered and confirmed.  A total of two complex treatment devices were fabricated in the form of the BodyFix immobilization pillow and a neck accuform cushion.  I have requested : 3D Simulation  I have requested a DVH of the following structures: Heart, Lungs, Esophagus, Chest Wall, Brachial Plexus, Major Blood Vessels, and targets.  SPECIAL TREATMENT PROCEDURE:  The planned course of therapy using radiation constitutes a  special treatment procedure. Special care is required in the management of this patient for the following reasons. This treatment constitutes a Special Treatment Procedure for the following reason: [ High dose per fraction requiring special monitoring for increased toxicities of treatment including daily imaging..  The special nature of the planned course of radiotherapy will require increased physician supervision and oversight to ensure patient's safety with optimal treatment outcomes.  This requires extended time and effort.    RESPIRATORY MOTION MANAGEMENT SIMULATION:  In order to account for effect of respiratory motion on target structures and other organs in the planning and delivery of radiotherapy, this patient underwent respiratory motion management simulation.  To accomplish this, when the patient was brought to the CT simulation planning suite, 4D respiratoy motion management CT images were obtained.  The CT images were loaded into the planning software.  Then, using a variety of tools including Cine, MIP, and standard views, the target volume and planning target volumes (PTV) were delineated.  Avoidance structures were contoured.  Treatment planning then occurred.  Dose volume histograms were generated and reviewed for each of the requested structure.  The resulting plan was carefully reviewed and approved today.  PLAN:  The patient will receive 54 Gy in 3 fractions to the nodules in the RUL, LUL and RML lung.  ________________________________  Artist Pais. Kathrynn Running, M.D.

## 2023-10-03 DIAGNOSIS — C3411 Malignant neoplasm of upper lobe, right bronchus or lung: Secondary | ICD-10-CM | POA: Diagnosis not present

## 2023-10-03 DIAGNOSIS — C342 Malignant neoplasm of middle lobe, bronchus or lung: Secondary | ICD-10-CM | POA: Diagnosis not present

## 2023-10-03 DIAGNOSIS — F17210 Nicotine dependence, cigarettes, uncomplicated: Secondary | ICD-10-CM | POA: Diagnosis not present

## 2023-10-03 DIAGNOSIS — C3431 Malignant neoplasm of lower lobe, right bronchus or lung: Secondary | ICD-10-CM | POA: Diagnosis not present

## 2023-10-12 ENCOUNTER — Ambulatory Visit
Admission: RE | Admit: 2023-10-12 | Discharge: 2023-10-12 | Disposition: A | Discharge status: Home / Self Care | Payer: Medicare Other | Source: Ambulatory Visit | Attending: Radiation Oncology

## 2023-10-12 ENCOUNTER — Other Ambulatory Visit: Payer: Self-pay

## 2023-10-12 DIAGNOSIS — C342 Malignant neoplasm of middle lobe, bronchus or lung: Secondary | ICD-10-CM | POA: Diagnosis not present

## 2023-10-12 DIAGNOSIS — C3411 Malignant neoplasm of upper lobe, right bronchus or lung: Secondary | ICD-10-CM | POA: Diagnosis not present

## 2023-10-12 LAB — RAD ONC ARIA SESSION SUMMARY
Course Elapsed Days: 0
Plan Fractions Treated to Date: 1
Plan Fractions Treated to Date: 1
Plan Fractions Treated to Date: 1
Plan Prescribed Dose Per Fraction: 18 Gy
Plan Prescribed Dose Per Fraction: 18 Gy
Plan Prescribed Dose Per Fraction: 18 Gy
Plan Total Fractions Prescribed: 3
Plan Total Fractions Prescribed: 3
Plan Total Fractions Prescribed: 3
Plan Total Prescribed Dose: 54 Gy
Plan Total Prescribed Dose: 54 Gy
Plan Total Prescribed Dose: 54 Gy
Reference Point Dosage Given to Date: 18 Gy
Reference Point Dosage Given to Date: 18 Gy
Reference Point Dosage Given to Date: 18 Gy
Reference Point Session Dosage Given: 18 Gy
Reference Point Session Dosage Given: 18 Gy
Reference Point Session Dosage Given: 18 Gy
Session Number: 1

## 2023-10-13 ENCOUNTER — Ambulatory Visit: Payer: Medicare Other

## 2023-10-14 ENCOUNTER — Other Ambulatory Visit: Payer: Self-pay

## 2023-10-14 ENCOUNTER — Ambulatory Visit
Admission: RE | Admit: 2023-10-14 | Discharge: 2023-10-14 | Disposition: A | Discharge status: Home / Self Care | Payer: Medicare Other | Source: Ambulatory Visit | Attending: Radiation Oncology | Admitting: Radiation Oncology

## 2023-10-14 DIAGNOSIS — C342 Malignant neoplasm of middle lobe, bronchus or lung: Secondary | ICD-10-CM | POA: Diagnosis not present

## 2023-10-14 DIAGNOSIS — C3411 Malignant neoplasm of upper lobe, right bronchus or lung: Secondary | ICD-10-CM | POA: Diagnosis not present

## 2023-10-14 LAB — RAD ONC ARIA SESSION SUMMARY
Course Elapsed Days: 2
Plan Fractions Treated to Date: 2
Plan Fractions Treated to Date: 2
Plan Fractions Treated to Date: 2
Plan Prescribed Dose Per Fraction: 18 Gy
Plan Prescribed Dose Per Fraction: 18 Gy
Plan Prescribed Dose Per Fraction: 18 Gy
Plan Total Fractions Prescribed: 3
Plan Total Fractions Prescribed: 3
Plan Total Fractions Prescribed: 3
Plan Total Prescribed Dose: 54 Gy
Plan Total Prescribed Dose: 54 Gy
Plan Total Prescribed Dose: 54 Gy
Reference Point Dosage Given to Date: 36 Gy
Reference Point Dosage Given to Date: 36 Gy
Reference Point Dosage Given to Date: 36 Gy
Reference Point Session Dosage Given: 18 Gy
Reference Point Session Dosage Given: 18 Gy
Reference Point Session Dosage Given: 18 Gy
Session Number: 2

## 2023-10-17 ENCOUNTER — Other Ambulatory Visit: Payer: Self-pay

## 2023-10-17 ENCOUNTER — Ambulatory Visit
Admission: RE | Admit: 2023-10-17 | Discharge: 2023-10-17 | Disposition: A | Discharge status: Home / Self Care | Payer: Medicare Other | Source: Ambulatory Visit | Attending: Radiation Oncology | Admitting: Radiation Oncology

## 2023-10-17 ENCOUNTER — Ambulatory Visit: Payer: Medicare Other

## 2023-10-17 DIAGNOSIS — C3411 Malignant neoplasm of upper lobe, right bronchus or lung: Secondary | ICD-10-CM | POA: Diagnosis not present

## 2023-10-17 DIAGNOSIS — C342 Malignant neoplasm of middle lobe, bronchus or lung: Secondary | ICD-10-CM | POA: Insufficient documentation

## 2023-10-17 DIAGNOSIS — C3431 Malignant neoplasm of lower lobe, right bronchus or lung: Secondary | ICD-10-CM | POA: Diagnosis not present

## 2023-10-17 DIAGNOSIS — C3491 Malignant neoplasm of unspecified part of right bronchus or lung: Secondary | ICD-10-CM

## 2023-10-17 DIAGNOSIS — Z510 Encounter for antineoplastic radiation therapy: Secondary | ICD-10-CM | POA: Diagnosis not present

## 2023-10-17 DIAGNOSIS — C3492 Malignant neoplasm of unspecified part of left bronchus or lung: Secondary | ICD-10-CM

## 2023-10-17 DIAGNOSIS — F17210 Nicotine dependence, cigarettes, uncomplicated: Secondary | ICD-10-CM | POA: Diagnosis not present

## 2023-10-17 LAB — RAD ONC ARIA SESSION SUMMARY
Course Elapsed Days: 5
Plan Fractions Treated to Date: 3
Plan Fractions Treated to Date: 3
Plan Prescribed Dose Per Fraction: 18 Gy
Plan Prescribed Dose Per Fraction: 18 Gy
Plan Total Fractions Prescribed: 3
Plan Total Fractions Prescribed: 3
Plan Total Prescribed Dose: 54 Gy
Plan Total Prescribed Dose: 54 Gy
Reference Point Dosage Given to Date: 54 Gy
Reference Point Dosage Given to Date: 54 Gy
Reference Point Session Dosage Given: 18 Gy
Reference Point Session Dosage Given: 18 Gy
Session Number: 3

## 2023-10-18 ENCOUNTER — Ambulatory Visit: Payer: Medicare Other

## 2023-10-18 NOTE — Radiation Completion Notes (Addendum)
  Radiation Oncology         [phone] ________________________________  Name: [name] MRN: [id number]  Date: 10/17/2023  DOB: March 08, 1951  Referring Physician: Levy Pupa, M.D. Date of Service: 2023-10-18 Radiation Oncologist: Margaretmary Bayley, M.D. 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, RML and LUL lung   Intent: Curative     ==========DELIVERED PLANS==========  First Treatment Date: 2023-10-12 Last Treatment Date: 2023-10-17   Plan Name: Lung_RUL_SBRT Site: Lung, Right Technique: SBRT/SRT-IMRT Mode: Photon Dose Per Fraction: 18 Gy Prescribed Dose (Delivered / Prescribed): 54 Gy / 54 Gy Prescribed Fxs (Delivered / Prescribed): 3 / 3   Plan Name: Lung_RML_SBRT Site: Lung, Right Technique: SBRT/SRT-IMRT Mode: Photon Dose Per Fraction: 18 Gy Prescribed Dose (Delivered / Prescribed): 54 Gy / 54 Gy Prescribed Fxs (Delivered / Prescribed): 3 / 3   Plan Name: Lung_LUL_SBRT Site: Lung, Left Technique: SBRT/SRT-IMRT Mode: Photon Dose Per Fraction: 18 Gy Prescribed Dose (Delivered / Prescribed): 54 Gy / 54 Gy Prescribed Fxs (Delivered / Prescribed): 3 / 3     ==========ON TREATMENT VISIT DATES========== 2023-10-12, 2023-10-12, 2023-10-12, 2023-10-14, 2023-10-14, 2023-10-14, 2023-10-17, 2023-10-17, 2023-10-17, 2023-10-17   See weekly On Treatment Notes in Epic for details in the Media tab (listed as Progress notes on the On Treatment Visit Dates listed above).  She tolerated the radiation treatment relatively well with modest fatigue.  The patient will receive a call in about one month from the radiation oncology department.  She will continue follow up with her pulmonologist, Dr. Delton Coombes, and medical oncologist, Dr. Arbutus Ped, as well.  ------------------------------------------------   Margaretmary Dys, MD [facility]  Radiation  Oncology Direct Dial: [phone]  Fax: [phone] [email or web address]  Skype  LinkedIn

## 2023-10-19 ENCOUNTER — Ambulatory Visit: Payer: Medicare Other

## 2023-10-21 ENCOUNTER — Ambulatory Visit: Payer: Medicare Other

## 2023-11-07 NOTE — Progress Notes (Signed)
  Radiation Oncology         [phone] ________________________________  Name: [name] MRN: [id number]  Date of Service: 11/15/2023  DOB: 31-Jan-1951  Post Treatment Telephone Note  Diagnosis:  C34.11 Malignant neoplasm of upper lobe, right bronchus or lung (as documented in provider EOT note)  The patient was available for call today.   Symptoms of fatigue have improved since completing therapy.  Symptoms of skin changes have improved since completing therapy.  Symptoms of esophagitis have improved since completing therapy.  The patient has scheduled follow up with her medical oncologist Dr. Si Gaul for ongoing care, and was encouraged to call if she develops concerns or questions regarding radiation.   This concludes the interaction.  Ruel Favors, LPN

## 2023-11-15 ENCOUNTER — Ambulatory Visit
Admission: RE | Admit: 2023-11-15 | Discharge: 2023-11-15 | Disposition: A | Discharge status: Home / Self Care | Source: Ambulatory Visit | Attending: Internal Medicine | Admitting: Internal Medicine

## 2023-11-21 DIAGNOSIS — M47812 Spondylosis without myelopathy or radiculopathy, cervical region: Secondary | ICD-10-CM | POA: Diagnosis not present

## 2023-12-12 DIAGNOSIS — M19049 Primary osteoarthritis, unspecified hand: Secondary | ICD-10-CM | POA: Diagnosis not present

## 2023-12-12 DIAGNOSIS — C3411 Malignant neoplasm of upper lobe, right bronchus or lung: Secondary | ICD-10-CM | POA: Diagnosis not present

## 2023-12-12 DIAGNOSIS — G250 Essential tremor: Secondary | ICD-10-CM | POA: Diagnosis not present

## 2024-01-11 ENCOUNTER — Other Ambulatory Visit: Payer: Medicare Other

## 2024-01-11 DIAGNOSIS — I10 Essential (primary) hypertension: Secondary | ICD-10-CM | POA: Diagnosis not present

## 2024-01-11 DIAGNOSIS — E1169 Type 2 diabetes mellitus with other specified complication: Secondary | ICD-10-CM | POA: Diagnosis not present

## 2024-01-11 DIAGNOSIS — I2510 Atherosclerotic heart disease of native coronary artery without angina pectoris: Secondary | ICD-10-CM | POA: Diagnosis not present

## 2024-01-11 DIAGNOSIS — I2584 Coronary atherosclerosis due to calcified coronary lesion: Secondary | ICD-10-CM | POA: Diagnosis not present

## 2024-01-12 ENCOUNTER — Ambulatory Visit (HOSPITAL_COMMUNITY)
Admission: RE | Admit: 2024-01-12 | Discharge: 2024-01-12 | Disposition: A | Discharge status: Home / Self Care | Source: Ambulatory Visit | Attending: Physician Assistant | Admitting: Physician Assistant

## 2024-01-12 ENCOUNTER — Inpatient Hospital Stay: Attending: Physician Assistant

## 2024-01-12 DIAGNOSIS — C3412 Malignant neoplasm of upper lobe, left bronchus or lung: Secondary | ICD-10-CM | POA: Diagnosis not present

## 2024-01-12 DIAGNOSIS — C3411 Malignant neoplasm of upper lobe, right bronchus or lung: Secondary | ICD-10-CM | POA: Insufficient documentation

## 2024-01-12 DIAGNOSIS — F17210 Nicotine dependence, cigarettes, uncomplicated: Secondary | ICD-10-CM | POA: Diagnosis not present

## 2024-01-12 DIAGNOSIS — C3480 Malignant neoplasm of overlapping sites of unspecified bronchus and lung: Secondary | ICD-10-CM | POA: Diagnosis not present

## 2024-01-12 DIAGNOSIS — K861 Other chronic pancreatitis: Secondary | ICD-10-CM | POA: Diagnosis not present

## 2024-01-12 DIAGNOSIS — I700 Atherosclerosis of aorta: Secondary | ICD-10-CM | POA: Diagnosis not present

## 2024-01-12 DIAGNOSIS — E7800 Pure hypercholesterolemia, unspecified: Secondary | ICD-10-CM | POA: Diagnosis not present

## 2024-01-12 DIAGNOSIS — Z79899 Other long term (current) drug therapy: Secondary | ICD-10-CM | POA: Diagnosis not present

## 2024-01-12 DIAGNOSIS — I10 Essential (primary) hypertension: Secondary | ICD-10-CM | POA: Insufficient documentation

## 2024-01-12 DIAGNOSIS — C342 Malignant neoplasm of middle lobe, bronchus or lung: Secondary | ICD-10-CM | POA: Diagnosis not present

## 2024-01-12 DIAGNOSIS — M1990 Unspecified osteoarthritis, unspecified site: Secondary | ICD-10-CM | POA: Diagnosis not present

## 2024-01-12 LAB — CBC WITH DIFFERENTIAL (CANCER CENTER ONLY)
Abs Immature Granulocytes: 0.02 10*3/uL (ref 0.00–0.07)
Basophils Absolute: 0.1 10*3/uL (ref 0.0–0.1)
Basophils Relative: 1 %
Eosinophils Absolute: 0.1 10*3/uL (ref 0.0–0.5)
Eosinophils Relative: 2 %
HCT: 44.1 % (ref 36.0–46.0)
Hemoglobin: 15.1 g/dL — ABNORMAL HIGH (ref 12.0–15.0)
Immature Granulocytes: 0 %
Lymphocytes Relative: 18 %
Lymphs Abs: 1.4 10*3/uL (ref 0.7–4.0)
MCH: 32.2 pg (ref 26.0–34.0)
MCHC: 34.2 g/dL (ref 30.0–36.0)
MCV: 94 fL (ref 80.0–100.0)
Monocytes Absolute: 0.7 10*3/uL (ref 0.1–1.0)
Monocytes Relative: 9 %
Neutro Abs: 5.8 10*3/uL (ref 1.7–7.7)
Neutrophils Relative %: 70 %
Platelet Count: 220 10*3/uL (ref 150–400)
RBC: 4.69 MIL/uL (ref 3.87–5.11)
RDW: 13.5 % (ref 11.5–15.5)
WBC Count: 8.1 10*3/uL (ref 4.0–10.5)
nRBC: 0 % (ref 0.0–0.2)

## 2024-01-12 LAB — CMP (CANCER CENTER ONLY)
ALT: 14 U/L (ref 0–44)
AST: 16 U/L (ref 15–41)
Albumin: 3.7 g/dL (ref 3.5–5.0)
Alkaline Phosphatase: 60 U/L (ref 38–126)
Anion gap: 6 (ref 5–15)
BUN: 21 mg/dL (ref 8–23)
CO2: 25 mmol/L (ref 22–32)
Calcium: 9 mg/dL (ref 8.9–10.3)
Chloride: 106 mmol/L (ref 98–111)
Creatinine: 0.91 mg/dL (ref 0.44–1.00)
GFR, Estimated: >60 mL/min (ref >60)
Glucose, Bld: 96 mg/dL (ref 70–99)
Potassium: 4.4 mmol/L (ref 3.5–5.1)
Sodium: 137 mmol/L (ref 135–145)
Total Bilirubin: 0.5 mg/dL (ref 0.0–1.2)
Total Protein: 8.6 g/dL — ABNORMAL HIGH (ref 6.5–8.1)

## 2024-01-12 MED ORDER — IOHEXOL 300 MG/ML  SOLN
75.0000 mL | Freq: Once | INTRAMUSCULAR | Status: AC | PRN
  Administered 2024-01-12: 75 mL via INTRAVENOUS

## 2024-01-12 MED ORDER — IOHEXOL 300 MG/ML  SOLN
100.0000 mL | Freq: Once | INTRAMUSCULAR | Status: DC | PRN
Start: 2024-01-12 — End: 2024-01-12

## 2024-01-12 MED ORDER — SODIUM CHLORIDE (PF) 0.9 % IJ SOLN
INTRAMUSCULAR | Status: AC
  Filled 2024-01-12: qty 50

## 2024-01-14 DIAGNOSIS — I2584 Coronary atherosclerosis due to calcified coronary lesion: Secondary | ICD-10-CM | POA: Diagnosis not present

## 2024-01-14 DIAGNOSIS — M150 Primary generalized (osteo)arthritis: Secondary | ICD-10-CM | POA: Diagnosis not present

## 2024-01-14 DIAGNOSIS — E669 Obesity, unspecified: Secondary | ICD-10-CM | POA: Diagnosis not present

## 2024-01-14 DIAGNOSIS — I2510 Atherosclerotic heart disease of native coronary artery without angina pectoris: Secondary | ICD-10-CM | POA: Diagnosis not present

## 2024-01-18 ENCOUNTER — Inpatient Hospital Stay: Payer: Medicare Other | Attending: Internal Medicine | Admitting: Internal Medicine

## 2024-01-18 VITALS — BP 145/76 | HR 78 | Temp 97.6°F | Resp 18 | Ht 65.0 in | Wt 169.1 lb

## 2024-01-18 DIAGNOSIS — C3490 Malignant neoplasm of unspecified part of unspecified bronchus or lung: Secondary | ICD-10-CM | POA: Diagnosis not present

## 2024-01-18 DIAGNOSIS — Z923 Personal history of irradiation: Secondary | ICD-10-CM | POA: Diagnosis not present

## 2024-01-18 DIAGNOSIS — F17210 Nicotine dependence, cigarettes, uncomplicated: Secondary | ICD-10-CM | POA: Diagnosis not present

## 2024-01-18 DIAGNOSIS — Z85118 Personal history of other malignant neoplasm of bronchus and lung: Secondary | ICD-10-CM | POA: Insufficient documentation

## 2024-01-18 DIAGNOSIS — M47812 Spondylosis without myelopathy or radiculopathy, cervical region: Secondary | ICD-10-CM | POA: Diagnosis not present

## 2024-01-18 NOTE — Progress Notes (Signed)
 [facility] [phone]   [phone]  OFFICE PROGRESS NOTE  Pahwani, Regino Caprio, MD 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  PRIOR THERAPY: Curative SBRT to the 3 pulmonary nodules under the care of Dr. Lorri Rota.  CURRENT THERAPY: Observation  INTERVAL HISTORY: [name] 73 y.o. female returns to the clinic today for follow-up visit.Discussed the use of AI scribe software for clinical note transcription with the patient, who gave verbal consent to proceed.  History of Present Illness   [name] is a 73 year old female with stage 1A non-small cell lung cancer who presents for evaluation and repeat CT scan for restaging.  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 She underwent stereotactic body radiation therapy (SBRT) to three nodules. She has undergone radiation therapy to the lung nodules and reports handling it well without side effects. She is concerned about the scarring from radiation. No current symptoms such as cough.  She experiences arthritis affecting her fingers and neck, which has been problematic due to her inability to take meloxicam. She mentions a history of being misdiagnosed with diverticulitis, which led to unnecessary treatment and surgery. She experiences colon bleeding when taking meloxicam or receiving the COVID vaccine.  She is in the process of quitting smoking, having previously quit by gradually reducing her intake. She does not use electronic health records like MyChart for scheduling appointments.  No cough or other  respiratory symptoms. Her abdomen is 'okay'.       MEDICAL HISTORY: Past Medical History:  Diagnosis Date   Arthritis    Cervical spinal stenosis    Diverticulitis    Family history of anesthesia complication    Sister has nausea and vomitting.   Hypercholesteremia    Hypertension    Pre-diabetes     ALLERGIES:  is allergic to lyrica [pregabalin], simvastatin , statins, and meloxicam.  MEDICATIONS:  Current Outpatient Medications  Medication Sig Dispense Refill   acetaminophen  (TYLENOL ) 500 MG tablet Take 500 mg by mouth every 6 (six) hours as needed for mild pain, moderate pain, fever or headache.     ALPRAZolam  (XANAX ) 0.5 MG tablet Take 0.25 tablets by mouth every 6 (six) hours as needed for anxiety or sleep. anxiety  0   Ascorbic Acid  (VITAMIN C  PO) Take 1 tablet by mouth 3 (three) times a week.     BOOSTRIX 5-2.5-18.5 LF-MCG/0.5 injection      Cholecalciferol (VITAMIN D ) 50 MCG (2000 UT) tablet Take 2,000 Units by mouth daily.      Ferrous Sulfate  (SLOW FE) 142 (45 Fe) MG TBCR Take 1 tablet by mouth 3 (three) times a week.     methocarbamol  (ROBAXIN ) 500 MG tablet Take 1 tablet (500 mg total) by mouth 2 (two) times daily as needed for muscle spasms. 20 tablet 0   Multiple Vitamins-Iron (MULTI-VITAMIN/IRON PO) Take 1 tablet by mouth 3 (three) times a week.     No current facility-administered medications for this visit.    SURGICAL HISTORY:  Past Surgical History:  Procedure Laterality Date   ABDOMINAL HYSTERECTOMY  1995  ANTERIOR CERVICAL DECOMP/DISCECTOMY FUSION  12/30/2011   BACK SURGERY  1992   BRONCHIAL BIOPSY  09/05/2023   Procedure: BRONCHIAL BIOPSIES;  Surgeon: Denson Flake, MD;  Location: [facility] ENDOSCOPY;  Service: Pulmonary;;   BRONCHIAL BRUSHINGS  09/05/2023   Procedure: BRONCHIAL BRUSHINGS;  Surgeon: Denson Flake, MD;  Location: [facility] ENDOSCOPY;  Service: Pulmonary;;   BRONCHIAL NEEDLE ASPIRATION BIOPSY  09/05/2023   Procedure: BRONCHIAL NEEDLE ASPIRATION  BIOPSIES;  Surgeon: Denson Flake, MD;  Location: [facility] END[facility]COPY;  Service: Pulmonary;;   COLECTOMY  2004   "lost 1/3 of my colon"   COLONOSCOPY WITH PROPOFOL  N/A 08/28/2021   Procedure: COLONOSCOPY WITH PROPOFOL ;  Surgeon: Alvis Jourdain, MD;  Location: [facility] ENDOSCOPY;  Service: Endoscopy;  Laterality: N/A;   FIDUCIAL MARKER PLACEMENT  09/05/2023   Procedure: FIDUCIAL MARKER PLACEMENT;  Surgeon: Denson Flake, MD;  Location: [facility] ENDOSCOPY;  Service: Pulmonary;;   HEMOSTASIS CLIP PLACEMENT  08/28/2021   Procedure: HEMOSTASIS CLIP PLACEMENT;  Surgeon: Alvis Jourdain, MD;  Location: [facility] ENDOSCOPY;  Service: Endoscopy;;   HOT HEMOSTASIS N/A 08/28/2021   Procedure: HOT HEMOSTASIS (ARGON PLASMA COAGULATION/BICAP);  Surgeon: Alvis Jourdain, MD;  Location: [facility] ENDOSCOPY;  Service: Endoscopy;  Laterality: N/A;   POLYPECTOMY  08/28/2021   Procedure: POLYPECTOMY;  Surgeon: Alvis Jourdain, MD;  Location: [facility] ENDOSCOPY;  Service: Endoscopy;;   THORACIC FUSION  1992   TUMOR REMOVAL     small intestine    REVIEW OF SYSTEMS:  Constitutional: positive for fatigue Eyes: negative Ears, nose, mouth, throat, and face: negative Respiratory: negative Cardiovascular: negative Gastrointestinal: negative Genitourinary:negative Integument/breast: negative Hematologic/lymphatic: negative Musculoskeletal:positive for arthralgias Neurological: negative Behavioral/Psych: negative Endocrine: negative Allergic/Immunologic: negative   PHYSICAL EXAMINATION: General appearance: alert, cooperative, appears stated age, and fatigued Head: Normocephalic, without obvious abnormality, atraumatic Neck: no adenopathy, no JVD, supple, symmetrical, trachea midline, and thyroid  not enlarged, symmetric, no tenderness/mass/nodules Lymph nodes: Cervical, supraclavicular, and axillary nodes normal. Resp: clear to auscultation bilaterally Back: symmetric, no curvature. ROM normal. No CVA tenderness. Cardio: regular rate and rhythm, S1,  S2 normal, no murmur, click, rub or gallop GI: soft, non-tender; bowel sounds normal; no masses,  no organomegaly Extremities: extremities normal, atraumatic, no cyanosis or edema Neurologic: Alert and oriented X 3, normal strength and tone. Normal symmetric reflexes. Normal coordination and gait  ECOG PERFORMANCE STATUS: 1 - Symptomatic but completely ambulatory  Blood pressure (!) 145/76, pulse 78, temperature 97.6 F (36.4 C), temperature source Tympanic, resp. rate 18, height 5\' 5"  (1.651 m), weight 169 lb 1.6 oz (76.7 kg), SpO2 97%.  LABORATORY DATA: Lab Results  Component Value Date   WBC 8.1 01/12/2024   HGB 15.1 (H) 01/12/2024   HCT 44.1 01/12/2024   MCV 94.0 01/12/2024   PLT 220 01/12/2024      Chemistry      Component Value Date/Time   NA 137 01/12/2024 0841   NA 139 10/19/2021 1225   K 4.4 01/12/2024 0841   CL 106 01/12/2024 0841   CO2 25 01/12/2024 0841   BUN 21 01/12/2024 0841   BUN 20 10/19/2021 1225   CREATININE 0.91 01/12/2024 0841      Component Value Date/Time   CALCIUM  9.0 01/12/2024 0841   ALKPHOS 60 01/12/2024 0841   AST 16 01/12/2024 0841   ALT 14 01/12/2024 0841   BILITOT 0.5 01/12/2024 0841       RADIOGRAPHIC STUDIES: CT Chest W Contrast Result Date: 01/16/2024 EXAM: CT CHEST WITH CONTRAST 01/12/2024 10:21:00 AM TECHNIQUE: CT of the  chest was performed with the administration of intravenous contrast. Multiplanar reformatted images are provided for review. Automated exposure control, iterative reconstruction, and/or weight based adjustment of the mA/kV was utilized to reduce the radiation dose to as low as reasonably achievable. COMPARISON: PET/CT dated 08/08/2023 and CT chest dated 06/08/2023. CLINICAL HISTORY: Non-small cell lung cancer (NSCLC), non-metastatic, assess treatment response. Malignant neoplasm of overlapping sites of lung, unspecified laterality. FINDINGS: MEDIASTINUM: Heart and pericardium are unremarkable. The central airways are  clear. Thoracic aortic atherosclerosis. Moderate 3-vessel coronary atherosclerosis. LYMPH NODES: No mediastinal, hilar or axillary lymphadenopathy. LUNGS AND PLEURA: 5 mm right middle lobe nodule with adjacent fiducial marker, corresponding to the patient's known squamous cell carcinoma, previously 6 mm, unchanged. 1.8 x 1.0 cm right upper lobe nodule with adjacent fiducial marker, previously 14 x 7 mm, corresponding to the patient's known adenocarcinoma, possibly mildly progressive. 12 x 7 mm left upper lobe nodule with adjacent fiducial marker, corresponding to the patient's known adenocarcinoma, unchanged. 6 mm irregular nodule in the posterior right middle lobe, slightly more conspicuous than on priors, warranting attention to follow-up. No pleural effusion or pneumothorax. SOFT TISSUES/BONES: No acute abnormality of the bones or soft tissues. Postsurgical changes with surgical clips in the left upper abdomen. UPPER ABDOMEN: Small left upper pole renal cyst measuring up to 13 mm, benign. No follow-up is recommended. Atherosclerotic calcification of the abdominal aorta. IMPRESSION: 1. 1.8 x 1.0 cm right upper lobe nodule, corresponding to the patient's known adenocarcinoma, possibly mildly progressive. 2. Stable left upper lobe and right middle lobe nodules, corresponding to the patient's known adenocarcinoma and squamous cell carcinoma. 3. 6 mm irregular nodule in the posterior right middle lobe, slightly more conspicuous than on priors, warranting attention to follow-up. 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 She is status post curative SBRT to the 3 pulmonary nodules  under the care of Dr. Lorri Rota. The patient had repeat CT scan of the chest performed recently.  I personally independently reviewed the scan images and discussed the result with the patient today.    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 Underwent SPRT to the three nodules. Current evaluation includes a repeat CT scan of the chest for restaging. Post-radiation changes are present, with some areas appearing more prominent due to scarring, which is expected. Differential diagnosis includes radiation-induced changes versus cancer recurrence. Further monitoring is required to differentiate between these possibilities. She is advised to quit smoking completely to improve overall health outcomes. - Order repeat CT scan in three months to monitor lung nodules - Consider PET scan if CT scan shows concerning changes - Encourage complete smoking cessation  Adverse reaction to meloxicam Experiences colon bleeding when taking meloxicam, likely due to a history of misdiagnosed diverticulitis and subsequent unnecessary colon surgery. Also reports colon bleeding with COVID vaccine administration.  Arthritis Arthritis affecting her fingers and neck area. Unable to take meloxicam due to adverse reactions.   She was advised to call immediately if she has any other concerning symptoms in the interval.  The patient voices understanding of current disease status and treatment options and is in agreement with the current care plan.  All questions were answered. The patient knows to call the clinic with any problems, questions or concerns. We can certainly see the patient much sooner if necessary.  The total time spent in the appointment was 30 minutes including review of chart and various tests results, discussions about plan of care and  coordination of care plan .   Disclaimer: This note was dictated with voice recognition software. Similar sounding words can inadvertently be transcribed and may not be corrected upon review.

## 2024-02-06 DIAGNOSIS — K080 Exfoliation of teeth due to systemic causes: Secondary | ICD-10-CM | POA: Diagnosis not present

## 2024-02-09 DIAGNOSIS — I2584 Coronary atherosclerosis due to calcified coronary lesion: Secondary | ICD-10-CM | POA: Diagnosis not present

## 2024-02-09 DIAGNOSIS — I2510 Atherosclerotic heart disease of native coronary artery without angina pectoris: Secondary | ICD-10-CM | POA: Diagnosis not present

## 2024-02-09 DIAGNOSIS — E1169 Type 2 diabetes mellitus with other specified complication: Secondary | ICD-10-CM | POA: Diagnosis not present

## 2024-02-09 DIAGNOSIS — I10 Essential (primary) hypertension: Secondary | ICD-10-CM | POA: Diagnosis not present

## 2024-02-20 DIAGNOSIS — E559 Vitamin D deficiency, unspecified: Secondary | ICD-10-CM | POA: Diagnosis not present

## 2024-02-20 DIAGNOSIS — M19049 Primary osteoarthritis, unspecified hand: Secondary | ICD-10-CM | POA: Diagnosis not present

## 2024-02-20 DIAGNOSIS — F419 Anxiety disorder, unspecified: Secondary | ICD-10-CM | POA: Diagnosis not present

## 2024-03-10 DIAGNOSIS — I2584 Coronary atherosclerosis due to calcified coronary lesion: Secondary | ICD-10-CM | POA: Diagnosis not present

## 2024-03-10 DIAGNOSIS — E1169 Type 2 diabetes mellitus with other specified complication: Secondary | ICD-10-CM | POA: Diagnosis not present

## 2024-03-10 DIAGNOSIS — I2510 Atherosclerotic heart disease of native coronary artery without angina pectoris: Secondary | ICD-10-CM | POA: Diagnosis not present

## 2024-03-10 DIAGNOSIS — I10 Essential (primary) hypertension: Secondary | ICD-10-CM | POA: Diagnosis not present

## 2024-03-15 DIAGNOSIS — E669 Obesity, unspecified: Secondary | ICD-10-CM | POA: Diagnosis not present

## 2024-03-15 DIAGNOSIS — E1169 Type 2 diabetes mellitus with other specified complication: Secondary | ICD-10-CM | POA: Diagnosis not present

## 2024-03-15 DIAGNOSIS — M150 Primary generalized (osteo)arthritis: Secondary | ICD-10-CM | POA: Diagnosis not present

## 2024-03-15 DIAGNOSIS — I2584 Coronary atherosclerosis due to calcified coronary lesion: Secondary | ICD-10-CM | POA: Diagnosis not present

## 2024-03-15 DIAGNOSIS — I10 Essential (primary) hypertension: Secondary | ICD-10-CM | POA: Diagnosis not present

## 2024-03-15 DIAGNOSIS — I2510 Atherosclerotic heart disease of native coronary artery without angina pectoris: Secondary | ICD-10-CM | POA: Diagnosis not present

## 2024-04-09 DIAGNOSIS — I2584 Coronary atherosclerosis due to calcified coronary lesion: Secondary | ICD-10-CM | POA: Diagnosis not present

## 2024-04-09 DIAGNOSIS — I2510 Atherosclerotic heart disease of native coronary artery without angina pectoris: Secondary | ICD-10-CM | POA: Diagnosis not present

## 2024-04-09 DIAGNOSIS — H6123 Impacted cerumen, bilateral: Secondary | ICD-10-CM | POA: Diagnosis not present

## 2024-04-09 DIAGNOSIS — I10 Essential (primary) hypertension: Secondary | ICD-10-CM | POA: Diagnosis not present

## 2024-04-09 DIAGNOSIS — R251 Tremor, unspecified: Secondary | ICD-10-CM | POA: Diagnosis not present

## 2024-04-09 DIAGNOSIS — E1169 Type 2 diabetes mellitus with other specified complication: Secondary | ICD-10-CM | POA: Diagnosis not present

## 2024-04-15 DIAGNOSIS — I10 Essential (primary) hypertension: Secondary | ICD-10-CM | POA: Diagnosis not present

## 2024-04-15 DIAGNOSIS — I2584 Coronary atherosclerosis due to calcified coronary lesion: Secondary | ICD-10-CM | POA: Diagnosis not present

## 2024-04-15 DIAGNOSIS — I2510 Atherosclerotic heart disease of native coronary artery without angina pectoris: Secondary | ICD-10-CM | POA: Diagnosis not present

## 2024-04-15 DIAGNOSIS — M150 Primary generalized (osteo)arthritis: Secondary | ICD-10-CM | POA: Diagnosis not present

## 2024-04-15 DIAGNOSIS — E1169 Type 2 diabetes mellitus with other specified complication: Secondary | ICD-10-CM | POA: Diagnosis not present

## 2024-04-15 DIAGNOSIS — E669 Obesity, unspecified: Secondary | ICD-10-CM | POA: Diagnosis not present

## 2024-04-18 ENCOUNTER — Inpatient Hospital Stay

## 2024-04-18 ENCOUNTER — Ambulatory Visit (HOSPITAL_COMMUNITY)

## 2024-04-25 ENCOUNTER — Ambulatory Visit (HOSPITAL_COMMUNITY)

## 2024-04-25 ENCOUNTER — Ambulatory Visit: Admitting: Internal Medicine

## 2024-04-25 ENCOUNTER — Encounter (HOSPITAL_COMMUNITY): Payer: Self-pay

## 2024-04-25 ENCOUNTER — Inpatient Hospital Stay: Attending: Internal Medicine

## 2024-04-25 ENCOUNTER — Ambulatory Visit (HOSPITAL_COMMUNITY)
Admission: RE | Admit: 2024-04-25 | Discharge: 2024-04-25 | Disposition: A | Discharge status: Home / Self Care | Source: Ambulatory Visit | Attending: Internal Medicine | Admitting: Internal Medicine

## 2024-04-25 DIAGNOSIS — C3490 Malignant neoplasm of unspecified part of unspecified bronchus or lung: Secondary | ICD-10-CM | POA: Diagnosis not present

## 2024-04-25 DIAGNOSIS — C342 Malignant neoplasm of middle lobe, bronchus or lung: Secondary | ICD-10-CM | POA: Diagnosis not present

## 2024-04-25 DIAGNOSIS — Z923 Personal history of irradiation: Secondary | ICD-10-CM | POA: Insufficient documentation

## 2024-04-25 DIAGNOSIS — C3412 Malignant neoplasm of upper lobe, left bronchus or lung: Secondary | ICD-10-CM | POA: Insufficient documentation

## 2024-04-25 DIAGNOSIS — C3411 Malignant neoplasm of upper lobe, right bronchus or lung: Secondary | ICD-10-CM | POA: Insufficient documentation

## 2024-04-25 LAB — CBC WITH DIFFERENTIAL (CANCER CENTER ONLY)
Abs Immature Granulocytes: 0.03 K/uL (ref 0.00–0.07)
Basophils Absolute: 0.1 K/uL (ref 0.0–0.1)
Basophils Relative: 1 %
Eosinophils Absolute: 0.1 K/uL (ref 0.0–0.5)
Eosinophils Relative: 1 %
HCT: 42.8 % (ref 36.0–46.0)
Hemoglobin: 14.5 g/dL (ref 12.0–15.0)
Immature Granulocytes: 0 %
Lymphocytes Relative: 17 %
Lymphs Abs: 1.6 K/uL (ref 0.7–4.0)
MCH: 32.2 pg (ref 26.0–34.0)
MCHC: 33.9 g/dL (ref 30.0–36.0)
MCV: 94.9 fL (ref 80.0–100.0)
Monocytes Absolute: 0.6 K/uL (ref 0.1–1.0)
Monocytes Relative: 6 %
Neutro Abs: 7 K/uL (ref 1.7–7.7)
Neutrophils Relative %: 75 %
Platelet Count: 225 K/uL (ref 150–400)
RBC: 4.51 MIL/uL (ref 3.87–5.11)
RDW: 13.2 % (ref 11.5–15.5)
WBC Count: 9.4 K/uL (ref 4.0–10.5)
nRBC: 0 % (ref 0.0–0.2)

## 2024-04-25 LAB — CMP (CANCER CENTER ONLY)
ALT: 15 U/L (ref 0–44)
AST: 15 U/L (ref 15–41)
Albumin: 3.6 g/dL (ref 3.5–5.0)
Alkaline Phosphatase: 46 U/L (ref 38–126)
Anion gap: 4 — ABNORMAL LOW (ref 5–15)
BUN: 21 mg/dL (ref 8–23)
CO2: 29 mmol/L (ref 22–32)
Calcium: 9.3 mg/dL (ref 8.9–10.3)
Chloride: 106 mmol/L (ref 98–111)
Creatinine: 0.9 mg/dL (ref 0.44–1.00)
GFR, Estimated: >60 mL/min (ref >60)
Glucose, Bld: 133 mg/dL — ABNORMAL HIGH (ref 70–99)
Potassium: 4.3 mmol/L (ref 3.5–5.1)
Sodium: 139 mmol/L (ref 135–145)
Total Bilirubin: 0.6 mg/dL (ref 0.0–1.2)
Total Protein: 8.4 g/dL — ABNORMAL HIGH (ref 6.5–8.1)

## 2024-04-25 MED ORDER — IOHEXOL 300 MG/ML  SOLN
75.0000 mL | Freq: Once | INTRAMUSCULAR | Status: AC | PRN
  Administered 2024-04-25: 75 mL via INTRAVENOUS

## 2024-05-01 ENCOUNTER — Inpatient Hospital Stay (HOSPITAL_BASED_OUTPATIENT_CLINIC_OR_DEPARTMENT_OTHER): Admitting: Internal Medicine

## 2024-05-01 VITALS — BP 138/73 | HR 80 | Temp 97.8°F | Resp 17 | Ht 65.0 in | Wt 165.1 lb

## 2024-05-01 DIAGNOSIS — C3490 Malignant neoplasm of unspecified part of unspecified bronchus or lung: Secondary | ICD-10-CM

## 2024-05-01 DIAGNOSIS — C3412 Malignant neoplasm of upper lobe, left bronchus or lung: Secondary | ICD-10-CM | POA: Diagnosis not present

## 2024-05-01 NOTE — Progress Notes (Signed)
 [facility] [phone]   Fax:([phone]  OFFICE PROGRESS NOTE  Pahwani, Velna SAUNDERS, MD [address] [place] [address]  DIAGNOSIS: Three separate stage Ia non-small cell lung cancer presented with left upper lobe as well as right upper lobe adenocarcinoma in addition to right middle lobe squamous cell carcinoma diagnosed in January 2024.  PRIOR THERAPY: Curative SBRT to the 3 pulmonary nodules under the care of Dr. Patrcia.  CURRENT THERAPY: Observation  INTERVAL HISTORY: [name] 73 y.o. female returns to the clinic today for follow-up visit.Discussed the use of AI scribe software for clinical note transcription with the patient, who gave verbal consent to proceed.  History of Present Illness [name] is a 73 year old female with stage IA non-small cell lung cancer who presents for evaluation with repeat CT scan of the chest for restaging of her disease.  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 She underwent curative stereotactic body radiation therapy to the cerebral nodules and has been under observation since then.  Over the past six months, she has had no new complaints, including no chest pain, breathing issues, or cough. She mentions intentional weight loss due to reduced cooking activity, stating 'I'm just lazy.'  A recent CT scan of the chest was performed, which showed multifocal adenocarcinoma and scattered pulmonary nodules, with a perifissural nodule along the posterior aspect of the right middle lobe noted to be slightly enlarged compared to the prior exam.  She is currently not experiencing any symptoms such as night sweats or unintentional weight loss.       MEDICAL HISTORY: Past Medical History:  Diagnosis Date   Arthritis    Cervical spinal  stenosis    Diverticulitis    Family history of anesthesia complication    Sister has nausea and vomitting.   Hypercholesteremia    Hypertension    Pre-diabetes     ALLERGIES:  is allergic to lyrica [pregabalin], simvastatin , statins, and meloxicam.  MEDICATIONS:  Current Outpatient Medications  Medication Sig Dispense Refill   acetaminophen  (TYLENOL ) 500 MG tablet Take 500 mg by mouth every 6 (six) hours as needed for mild pain, moderate pain, fever or headache.     ALPRAZolam  (XANAX ) 0.5 MG tablet Take 0.25 tablets by mouth every 6 (six) hours as needed for anxiety or sleep. anxiety  0   Ascorbic Acid  (VITAMIN C  PO) Take 1 tablet by mouth 3 (three) times a week.     BOOSTRIX 5-2.5-18.5 LF-MCG/0.5 injection      Cholecalciferol (VITAMIN D ) 50 MCG (2000 UT) tablet Take 2,000 Units by mouth daily.      Ferrous Sulfate  (SLOW FE) 142 (45 Fe) MG TBCR Take 1 tablet by mouth 3 (three) times a week.     methocarbamol  (ROBAXIN ) 500 MG tablet Take 1 tablet (500 mg total) by mouth 2 (two) times daily as needed for muscle spasms. 20 tablet 0   Multiple Vitamins-Iron (MULTI-VITAMIN/IRON PO) Take 1 tablet by mouth 3 (three) times a week.     No current facility-administered medications for this visit.    SURGICAL HISTORY:  Past Surgical History:  Procedure Laterality Date   ABDOMINAL HYSTERECTOMY  1995   ANTERIOR CERVICAL DECOMP/DISCECTOMY FUSION  12/30/2011   BACK SURGERY  1992   BRONCHIAL BIOPSY  09/05/2023   Procedure: BRONCHIAL BIOPSIES;  Surgeon: Shelah Lamar RAMAN, MD;  Location: [facility] ENDOSCOPY;  Service: Pulmonary;;   BRONCHIAL BRUSHINGS  09/05/2023   Procedure: BRONCHIAL BRUSHINGS;  Surgeon: Shelah Lamar RAMAN, MD;  Location: [facility] ENDOSCOPY;  Service: Pulmonary;;   BRONCHIAL NEEDLE ASPIRATION BIOPSY  09/05/2023   Procedure: BRONCHIAL NEEDLE ASPIRATION BIOPSIES;  Surgeon: Shelah Lamar RAMAN, MD;  Location: [facility] ENDOSCOPY;  Service: Pulmonary;;   COLECTOMY  2004   lost 1/3 of my colon   COLONOSCOPY  WITH PROPOFOL  N/A 08/28/2021   Procedure: COLONOSCOPY WITH PROPOFOL ;  Surgeon: Rollin Dover, MD;  Location: [facility] ENDOSCOPY;  Service: Endoscopy;  Laterality: N/A;   FIDUCIAL MARKER PLACEMENT  09/05/2023   Procedure: FIDUCIAL MARKER PLACEMENT;  Surgeon: Shelah Lamar RAMAN, MD;  Location: [facility] ENDOSCOPY;  Service: Pulmonary;;   HEMOSTASIS CLIP PLACEMENT  08/28/2021   Procedure: HEMOSTASIS CLIP PLACEMENT;  Surgeon: Rollin Dover, MD;  Location: [facility] ENDOSCOPY;  Service: Endoscopy;;   HOT HEMOSTASIS N/A 08/28/2021   Procedure: HOT HEMOSTASIS (ARGON PLASMA COAGULATION/BICAP);  Surgeon: Rollin Dover, MD;  Location: [facility];  Service: Endoscopy;  Laterality: N/A;   POLYPECTOMY  08/28/2021   Procedure: POLYPECTOMY;  Surgeon: Rollin Dover, MD;  Location: [facility] ENDOSCOPY;  Service: Endoscopy;;   THORACIC FUSION  1992   TUMOR REMOVAL     small intestine    REVIEW OF SYSTEMS:  A comprehensive review of systems was negative.   PHYSICAL EXAMINATION: General appearance: alert, cooperative, appears stated age, and fatigued Head: Normocephalic, without obvious abnormality, atraumatic Neck: no adenopathy, no JVD, supple, symmetrical, trachea midline, and thyroid  not enlarged, symmetric, no tenderness/mass/nodules Lymph nodes: Cervical, supraclavicular, and axillary nodes normal. Resp: clear to auscultation bilaterally Back: symmetric, no curvature. ROM normal. No CVA tenderness. Cardio: regular rate and rhythm, S1, S2 normal, no murmur, click, rub or gallop GI: soft, non-tender; bowel sounds normal; no masses,  no organomegaly Extremities: extremities normal, atraumatic, no cyanosis or edema  ECOG PERFORMANCE STATUS: 1 - Symptomatic but completely ambulatory  Blood pressure 138/73, pulse 80, temperature 97.8 F (36.6 C), resp. rate 17, height 5' 5 (1.651 m), weight 165 lb 1.6 oz (74.9 kg), SpO2 96%.  LABORATORY DATA: Lab Results  Component Value Date   WBC 9.4 04/25/2024   HGB 14.5 04/25/2024   HCT  42.8 04/25/2024   MCV 94.9 04/25/2024   PLT 225 04/25/2024      Chemistry      Component Value Date/Time   NA 139 04/25/2024 0922   NA 139 10/19/2021 1225   K 4.3 04/25/2024 0922   CL 106 04/25/2024 0922   CO2 29 04/25/2024 0922   BUN 21 04/25/2024 0922   BUN 20 10/19/2021 1225   CREATININE 0.90 04/25/2024 0922      Component Value Date/Time   CALCIUM  9.3 04/25/2024 0922   ALKPHOS 46 04/25/2024 0922   AST 15 04/25/2024 0922   ALT 15 04/25/2024 0922   BILITOT 0.6 04/25/2024 9077       RADIOGRAPHIC STUDIES: CT Chest W Contrast Result Date: 04/30/2024 CLINICAL DATA:  Non-small cell lung cancer EXAM: CT CHEST WITH CONTRAST TECHNIQUE: Multidetector CT imaging of the chest was performed during intravenous contrast administration. RADIATION DOSE REDUCTION: This exam was performed according to the departmental dose-optimization program which includes automated exposure control, adjustment of the mA and/or kV according to patient size and/or use of iterative reconstruction technique. CONTRAST:  75mL OMNIPAQUE  IOHEXOL  300 MG/ML  SOLN COMPARISON:  Chest CT Jan 12, 2024 FINDINGS: Cardiovascular: The heart  size is normal. Atherosclerotic calcifications of coronary arteries. No aneurysm. Severe atherosclerotic plaques/mural thrombus (2/104) involving the distal descending aorta Mediastinum/Nodes: No suspicious lymphadenopathy. Few subcentimeter normal-appearing lymph nodes in lower paratracheal on the right and prevascular. Lungs/Pleura: Multifocal subsolid pulmonary nodules throughout both lungs consistent with known multifocal adenocarcinoma spectrum, grossly stable to prior except for a perifissural spiculated nodule along the right middle lobe which is slightly enlarged measuring 9 mm versus previously measured 6 mm. (7/80). Some of the known site of adenocarcinoma spectrum containing fiducial markers (7/96 in right middle lobe, 7/76 in left upper lobe, 7/48 in right upper lobe). Index lesion in  right upper lobe measures 1.7 x 1.2 cm (7/48). Additional scattered pulmonary micro nodules are stable for example in left lower lobe (7/98). No pleural effusion.  Trachea and major airways are patent. Upper Abdomen: Hepatic steatosis . 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 Atrophic changes of the pancreas. Musculoskeletal: No suspicious findings to suggest metastatic osseous disease. Degenerative changes of the spine. Status post ACDF. IMPRESSION: Multifocal adenocarcinoma spectrum and scattered pulmonary micro nodules grossly unchanged to prior except for a perifissural nodule along the posterior aspect of right middle lobe which is slightly enlarged. Recommend continued attention on follow-up according to oncologic protocols. No suspicious lymphadenopathy or metastatic disease within the field of view. Electronically Signed   By: Megan  Zare M.D.   On: 04/30/2024 18:46    ASSESSMENT AND PLAN: This is a very pleasant 73 years old white female with Three separate stage Ia non-small cell lung cancer presented with left upper lobe as well as right upper lobe adenocarcinoma in addition to right middle lobe squamous cell carcinoma diagnosed in January 2024. She is status post curative SBRT to the 3 pulmonary nodules under the care of Dr. Patrcia. The patient is currently on observation.  She had repeat CT scan of the chest performed recently.  I personally and independently reviewed the scan and discussed the result with the patient today.  Her scan showed no concerning findings for disease recurrence of the metastasis. Assessment and Plan Assessment & Plan Multifocal stage IA non-small cell lung cancer (adenocarcinoma and squamous cell carcinoma) Status post curative stereotactic body radiation therapy (SBRT) to cerebral nodules, currently under surveillance. Involvement of left upper lobe, right upper  lobe, and right middle lobe. Recent CT scan shows a slightly enlarged perifissural nodule along the posterior aspect of the right middle lobe, not concerning for active disease. Presence of radiation-induced scarring complicates differentiation between scar tissue and active cancer on imaging. No new symptoms such as chest pain, dyspnea, or cough. No evidence of disease progression or metastasis. - Schedule CT scan of the chest in six months for surveillance. - Perform lab tests one week prior to the next appointment. - Coordinate follow-up visit in six months.  The patient voices understanding of current disease status and treatment options and is in agreement with the current care plan.  All questions were answered. The patient knows to call the clinic with any problems, questions or concerns. We can certainly see the patient much sooner if necessary.  The total time spent in the appointment was 20 minutes including review of chart and various tests results, discussions about plan of care and coordination of care plan .   Disclaimer: This note was dictated with voice recognition software. Similar sounding words can inadvertently be transcribed and may not be corrected upon  review.

## 2024-05-02 DIAGNOSIS — M65332 Trigger finger, left middle finger: Secondary | ICD-10-CM | POA: Diagnosis not present

## 2024-05-02 DIAGNOSIS — M65331 Trigger finger, right middle finger: Secondary | ICD-10-CM | POA: Diagnosis not present

## 2024-05-09 DIAGNOSIS — E1169 Type 2 diabetes mellitus with other specified complication: Secondary | ICD-10-CM | POA: Diagnosis not present

## 2024-05-09 DIAGNOSIS — I2584 Coronary atherosclerosis due to calcified coronary lesion: Secondary | ICD-10-CM | POA: Diagnosis not present

## 2024-05-09 DIAGNOSIS — I10 Essential (primary) hypertension: Secondary | ICD-10-CM | POA: Diagnosis not present

## 2024-05-09 DIAGNOSIS — I2510 Atherosclerotic heart disease of native coronary artery without angina pectoris: Secondary | ICD-10-CM | POA: Diagnosis not present

## 2024-05-15 DIAGNOSIS — I2584 Coronary atherosclerosis due to calcified coronary lesion: Secondary | ICD-10-CM | POA: Diagnosis not present

## 2024-05-15 DIAGNOSIS — I10 Essential (primary) hypertension: Secondary | ICD-10-CM | POA: Diagnosis not present

## 2024-05-15 DIAGNOSIS — I2510 Atherosclerotic heart disease of native coronary artery without angina pectoris: Secondary | ICD-10-CM | POA: Diagnosis not present

## 2024-05-15 DIAGNOSIS — E669 Obesity, unspecified: Secondary | ICD-10-CM | POA: Diagnosis not present

## 2024-05-15 DIAGNOSIS — E1169 Type 2 diabetes mellitus with other specified complication: Secondary | ICD-10-CM | POA: Diagnosis not present

## 2024-05-15 DIAGNOSIS — M150 Primary generalized (osteo)arthritis: Secondary | ICD-10-CM | POA: Diagnosis not present

## 2024-05-30 DIAGNOSIS — M65331 Trigger finger, right middle finger: Secondary | ICD-10-CM | POA: Diagnosis not present

## 2024-05-30 DIAGNOSIS — M65332 Trigger finger, left middle finger: Secondary | ICD-10-CM | POA: Diagnosis not present

## 2024-06-08 DIAGNOSIS — I2510 Atherosclerotic heart disease of native coronary artery without angina pectoris: Secondary | ICD-10-CM | POA: Diagnosis not present

## 2024-06-08 DIAGNOSIS — I10 Essential (primary) hypertension: Secondary | ICD-10-CM | POA: Diagnosis not present

## 2024-06-08 DIAGNOSIS — E1169 Type 2 diabetes mellitus with other specified complication: Secondary | ICD-10-CM | POA: Diagnosis not present

## 2024-06-08 DIAGNOSIS — I2584 Coronary atherosclerosis due to calcified coronary lesion: Secondary | ICD-10-CM | POA: Diagnosis not present

## 2024-06-15 DIAGNOSIS — I2584 Coronary atherosclerosis due to calcified coronary lesion: Secondary | ICD-10-CM | POA: Diagnosis not present

## 2024-06-15 DIAGNOSIS — I10 Essential (primary) hypertension: Secondary | ICD-10-CM | POA: Diagnosis not present

## 2024-06-15 DIAGNOSIS — E1169 Type 2 diabetes mellitus with other specified complication: Secondary | ICD-10-CM | POA: Diagnosis not present

## 2024-06-15 DIAGNOSIS — M150 Primary generalized (osteo)arthritis: Secondary | ICD-10-CM | POA: Diagnosis not present

## 2024-06-15 DIAGNOSIS — I2510 Atherosclerotic heart disease of native coronary artery without angina pectoris: Secondary | ICD-10-CM | POA: Diagnosis not present

## 2024-06-15 DIAGNOSIS — E669 Obesity, unspecified: Secondary | ICD-10-CM | POA: Diagnosis not present

## 2024-07-08 DIAGNOSIS — E1169 Type 2 diabetes mellitus with other specified complication: Secondary | ICD-10-CM | POA: Diagnosis not present

## 2024-07-08 DIAGNOSIS — I2510 Atherosclerotic heart disease of native coronary artery without angina pectoris: Secondary | ICD-10-CM | POA: Diagnosis not present

## 2024-07-08 DIAGNOSIS — I2584 Coronary atherosclerosis due to calcified coronary lesion: Secondary | ICD-10-CM | POA: Diagnosis not present

## 2024-07-08 DIAGNOSIS — I10 Essential (primary) hypertension: Secondary | ICD-10-CM | POA: Diagnosis not present

## 2024-07-09 ENCOUNTER — Emergency Department (HOSPITAL_COMMUNITY)

## 2024-07-09 ENCOUNTER — Encounter (HOSPITAL_COMMUNITY): Payer: Self-pay

## 2024-07-09 ENCOUNTER — Other Ambulatory Visit: Payer: Self-pay

## 2024-07-09 ENCOUNTER — Emergency Department (HOSPITAL_COMMUNITY)
Admission: EM | Admit: 2024-07-09 | Discharge: 2024-07-09 | Disposition: A | Discharge status: Home / Self Care | Attending: Emergency Medicine | Admitting: Emergency Medicine

## 2024-07-09 DIAGNOSIS — Z7952 Long term (current) use of systemic steroids: Secondary | ICD-10-CM | POA: Insufficient documentation

## 2024-07-09 DIAGNOSIS — F17200 Nicotine dependence, unspecified, uncomplicated: Secondary | ICD-10-CM | POA: Insufficient documentation

## 2024-07-09 DIAGNOSIS — J984 Other disorders of lung: Secondary | ICD-10-CM | POA: Diagnosis not present

## 2024-07-09 DIAGNOSIS — Z85118 Personal history of other malignant neoplasm of bronchus and lung: Secondary | ICD-10-CM | POA: Diagnosis not present

## 2024-07-09 DIAGNOSIS — J069 Acute upper respiratory infection, unspecified: Secondary | ICD-10-CM | POA: Insufficient documentation

## 2024-07-09 DIAGNOSIS — J40 Bronchitis, not specified as acute or chronic: Secondary | ICD-10-CM | POA: Insufficient documentation

## 2024-07-09 DIAGNOSIS — R058 Other specified cough: Secondary | ICD-10-CM | POA: Diagnosis not present

## 2024-07-09 DIAGNOSIS — C3490 Malignant neoplasm of unspecified part of unspecified bronchus or lung: Secondary | ICD-10-CM | POA: Diagnosis not present

## 2024-07-09 DIAGNOSIS — R0602 Shortness of breath: Secondary | ICD-10-CM | POA: Diagnosis not present

## 2024-07-09 DIAGNOSIS — R918 Other nonspecific abnormal finding of lung field: Secondary | ICD-10-CM | POA: Diagnosis not present

## 2024-07-09 DIAGNOSIS — R059 Cough, unspecified: Secondary | ICD-10-CM | POA: Diagnosis not present

## 2024-07-09 DIAGNOSIS — B9789 Other viral agents as the cause of diseases classified elsewhere: Secondary | ICD-10-CM | POA: Diagnosis not present

## 2024-07-09 LAB — CBC
HCT: 42.7 % (ref 36.0–46.0)
Hemoglobin: 14.1 g/dL (ref 12.0–15.0)
MCH: 32.4 pg (ref 26.0–34.0)
MCHC: 33 g/dL (ref 30.0–36.0)
MCV: 98.2 fL (ref 80.0–100.0)
Platelets: 258 K/uL (ref 150–400)
RBC: 4.35 MIL/uL (ref 3.87–5.11)
RDW: 13.1 % (ref 11.5–15.5)
WBC: 16.2 K/uL — ABNORMAL HIGH (ref 4.0–10.5)
nRBC: 0 % (ref 0.0–0.2)

## 2024-07-09 LAB — RESP PANEL BY RT-PCR (RSV, FLU A&B, COVID)  RVPGX2
Influenza A by PCR: NEGATIVE
Influenza B by PCR: NEGATIVE
Resp Syncytial Virus by PCR: NEGATIVE
SARS Coronavirus 2 by RT PCR: NEGATIVE

## 2024-07-09 LAB — BASIC METABOLIC PANEL WITH GFR
Anion gap: 11 (ref 5–15)
BUN: 20 mg/dL (ref 8–23)
CO2: 24 mmol/L (ref 22–32)
Calcium: 9.1 mg/dL (ref 8.9–10.3)
Chloride: 102 mmol/L (ref 98–111)
Creatinine, Ser: 0.97 mg/dL (ref 0.44–1.00)
GFR, Estimated: >60 mL/min (ref >60)
Glucose, Bld: 128 mg/dL — ABNORMAL HIGH (ref 70–99)
Potassium: 4.2 mmol/L (ref 3.5–5.1)
Sodium: 137 mmol/L (ref 135–145)

## 2024-07-09 MED ORDER — ALBUTEROL SULFATE HFA 108 (90 BASE) MCG/ACT IN AERS
2.0000 | INHALATION_SPRAY | Freq: Once | RESPIRATORY_TRACT | Status: AC
  Administered 2024-07-09: 2 via RESPIRATORY_TRACT
  Filled 2024-07-09: qty 6.7

## 2024-07-09 MED ORDER — IOHEXOL 350 MG/ML SOLN
75.0000 mL | Freq: Once | INTRAVENOUS | Status: AC | PRN
  Administered 2024-07-09: 75 mL via INTRAVENOUS

## 2024-07-09 MED ORDER — IPRATROPIUM-ALBUTEROL 0.5-2.5 (3) MG/3ML IN SOLN
3.0000 mL | Freq: Once | RESPIRATORY_TRACT | Status: AC
  Administered 2024-07-09: 3 mL via RESPIRATORY_TRACT
  Filled 2024-07-09: qty 3

## 2024-07-09 MED ORDER — AZITHROMYCIN 250 MG PO TABS: 250.0000 mg | ORAL_TABLET | Freq: Every day | ORAL | 0 refills | Status: DC

## 2024-07-09 MED ORDER — PREDNISONE 20 MG PO TABS: 60.0000 mg | ORAL_TABLET | Freq: Every day | ORAL | 0 refills | Status: AC

## 2024-07-09 MED ORDER — METHYLPREDNISOLONE SODIUM SUCC 125 MG IJ SOLR
125.0000 mg | Freq: Once | INTRAMUSCULAR | Status: AC
  Administered 2024-07-09: 125 mg via INTRAVENOUS
  Filled 2024-07-09: qty 2

## 2024-07-09 NOTE — ED Notes (Signed)
 Consulted dr rollo placing pt on nasal cannula (92 RA). MD declined.

## 2024-07-09 NOTE — ED Notes (Signed)
 Pt getting dressed to prepare for dispo

## 2024-07-09 NOTE — ED Provider Notes (Signed)
 [place] EMERGENCY DEPARTMENT AT [facility] Provider Note   CSN: [id number] Arrival date & time: 07/09/24  9288     Patient presents with: Cough, Chills, and Shortness of Breath   [name] is a 73 y.o. female.   Patient here with shortness of breath productive cough and chills for the last 2 days.  Has been around sick contact.  History of lung cancer treated with radiation.  She is undergoing monitoring at this time.  She still smokes intermittently.  Long smoking history.  But denies a history of COPD or emphysema.  She has a past history of hypertension high cholesterol.  She denies any chest pain leg swelling.  She does not think she has had fever.  She does have a dry cough.  The history is provided by the patient.       Prior to Admission medications   Medication Sig Start Date End Date Taking? Authorizing Provider  azithromycin  (ZITHROMAX ) 250 MG tablet Take 1 tablet (250 mg total) by mouth daily. Take first 2 tablets together, then 1 every day until finished. 07/09/24  Yes Vernessa Likes, DO  predniSONE  (DELTASONE ) 20 MG tablet Take 3 tablets (60 mg total) by mouth daily for 4 days. 07/09/24 07/13/24 Yes Aviona Martenson, DO  acetaminophen  (TYLENOL ) 500 MG tablet Take 500 mg by mouth every 6 (six) hours as needed for mild pain, moderate pain, fever or headache.    [provider]  ALPRAZolam  (XANAX ) 0.5 MG tablet Take 0.25 tablets by mouth every 6 (six) hours as needed for anxiety or sleep. anxiety 02/20/15   [provider]  Ascorbic Acid  (VITAMIN C  PO) Take 1 tablet by mouth 3 (three) times a week.    [provider]  BOOSTRIX 5-2.5-18.5 LF-MCG/0.5 injection  05/11/23   [provider]  Cholecalciferol (VITAMIN D ) 50 MCG (2000 UT) tablet Take 2,000 Units by mouth daily.     [provider]  Ferrous Sulfate  (SLOW FE) 142 (45 Fe) MG TBCR Take 1 tablet by mouth 3 (three) times a week.    [provider]   methocarbamol  (ROBAXIN ) 500 MG tablet Take 1 tablet (500 mg total) by mouth 2 (two) times daily as needed for muscle spasms. 05/26/23   Davis, Jonathon H, MD  Multiple Vitamins-Iron (MULTI-VITAMIN/IRON PO) Take 1 tablet by mouth 3 (three) times a week.    [provider]    Allergies: Lyrica [pregabalin], Simvastatin , Statins, and Meloxicam    Review of Systems  Updated Vital Signs BP 134/74   Pulse 80   Temp 98 F (36.7 C)   Resp 16   Ht 5' 5 (1.651 m)   Wt 74.9 kg   SpO2 92%   BMI 27.48 kg/m   Physical Exam Vitals and nursing note reviewed.  Constitutional:      General: She is not in acute distress.    Appearance: She is well-developed.  HENT:     Head: Normocephalic and atraumatic.     Mouth/Throat:     Mouth: Mucous membranes are moist.  Eyes:     Conjunctiva/sclera: Conjunctivae normal.     Pupils: Pupils are equal, round, and reactive to light.  Cardiovascular:     Rate and Rhythm: Normal rate and regular rhythm.     Pulses: Normal pulses.     Heart sounds: Normal heart sounds. No murmur heard. Pulmonary:     Effort: Pulmonary effort is normal. No respiratory distress.     Breath sounds: Wheezing  present.  Abdominal:     Palpations: Abdomen is soft.     Tenderness: There is no abdominal tenderness.  Musculoskeletal:        General: No swelling.     Cervical back: Normal range of motion and neck supple.  Skin:    General: Skin is warm and dry.     Capillary Refill: Capillary refill takes less than 2 seconds.  Neurological:     General: No focal deficit present.     Mental Status: She is alert.  Psychiatric:        Mood and Affect: Mood normal.     (all labs ordered are listed, but only abnormal results are displayed) Labs Reviewed  BASIC METABOLIC PANEL WITH GFR - Abnormal; Notable for the following components:      Result Value   Glucose, Bld 128 (*)    All other components within normal limits  CBC - Abnormal; Notable for the following  components:   WBC 16.2 (*)    All other components within normal limits  RESP PANEL BY RT-PCR (RSV, FLU A&B, COVID)  [id number]    EKG: EKG Interpretation Date/Time:  Monday July 09 2024 07:20:13 EST Ventricular Rate:  81 PR Interval:  212 QRS Duration:  103 QT Interval:  385 QTC Calculation: 447 R Axis:   -61  Text Interpretation: Sinus rhythm Confirmed by Ruthe Cornet [phone]) on 07/09/2024 7:22:47 AM  Radiology: CT Angio Chest PE W and/or Wo Contrast Result Date: 07/09/2024 CLINICAL DATA:  Concern for pulmonary embolism.  Lung cancer. EXAM: CT ANGIOGRAPHY CHEST WITH CONTRAST TECHNIQUE: Multidetector CT imaging of the chest was performed using the standard protocol during bolus administration of intravenous contrast. Multiplanar CT image reconstructions and MIPs were obtained to evaluate the vascular anatomy. RADIATION DOSE REDUCTION: This exam was performed according to the departmental dose-optimization program which includes automated exposure control, adjustment of the mA and/or kV according to patient size and/or use of iterative reconstruction technique. CONTRAST:  75mL OMNIPAQUE  IOHEXOL  350 MG/ML SOLN COMPARISON:  Chest radiograph dated 07/09/2024 and CT dated 04/25/2024. FINDINGS: Evaluation of this exam is limited due to respiratory motion. Cardiovascular: There is no cardiomegaly or pericardial effusion. There is advanced calcified and noncalcified plaque of the thoracic aorta. No aneurysmal dilatation or dissection. No pulmonary artery embolus identified. Mediastinum/Nodes: No hilar or mediastinal adenopathy. The esophagus is grossly unremarkable. No mediastinal fluid collection. Lungs/Pleura: Relatively similar size nodular density in the right upper lobe adjacent to biopsy clip or fiducial marker. Interval increase in the associated ground-glass density surrounding this nodule. Additional nodule in the right middle lobe posteriorly has increased in size since the prior CT  measuring approximately 10 x 8 mm on today's exam (previously 9 x 6 mm). Two solid-appearing nodules in the left lower lobe measure up to 2.7 x 2.8 cm and new since the prior CT. Interval increase in the size of the left upper lobe nodule adjacent to a biopsy clips or fiducial marker measuring approximately 12 mm (previously 9 mm). No pleural effusion or plaques. The central airways are patent. Upper Abdomen: No acute abnormality. Musculoskeletal: Osteopenia with degenerative changes. No acute osseous pathology. Review of the MIP images confirms the above findings. IMPRESSION: 1. No CT evidence of pulmonary artery embolus. 2. Interval increase in the size of the bilateral pulmonary nodules as well as development of new nodules in the left lower lobe. 3.  Aortic Atherosclerosis (ICD10-I70.0). Electronically Signed   By: Vanetta Chou M.D.   On: 07/09/2024  10:14   DG Chest 2 View Result Date: 07/09/2024 CLINICAL DATA:  Shortness of breath.  Productive cough. EXAM: CHEST - 2 VIEW COMPARISON:  Chest CT 04/25/2024.  Radiographs 09/05/2023. FINDINGS: The heart size and mediastinal contours are stable. Fiducial markers are present in both lungs with grossly stable surrounding nodularity. There is a new ill-defined nodular density projecting over the left lung base, only clearly visualized on the frontal examination. There is no confluent airspace disease, pneumothorax or significant pleural effusion. Mild degenerative changes in the spine status post lower cervical fusion. No acute osseous findings identified IMPRESSION: 1. New ill-defined nodular density projecting over the left lung base, only clearly visualized on the frontal examination. This could reflect a new pulmonary nodule or focal inflammation/infection. Followup PA and lateral chest X-ray is recommended in 4-6 weeks following appropriate therapy to ensure resolution and exclude underlying malignancy. Given the patient's history, follow-up CT may be  warranted. 2. Otherwise stable appearance of the chest with fiducial markers in both lungs. Electronically Signed   By: Elsie Perone M.D.   On: 07/09/2024 08:30     Procedures   Medications Ordered in the ED  albuterol  (VENTOLIN  HFA) 108 (90 Base) MCG/ACT inhaler 2 puff (has no administration in time range)  ipratropium-albuterol  (DUONEB) 0.5-2.5 (3) MG/3ML nebulizer solution 3 mL (3 mLs Nebulization Given 07/09/24 0809)  methylPREDNISolone  sodium succinate (SOLU-MEDROL ) 125 mg/2 mL injection 125 mg (125 mg Intravenous Given 07/09/24 0807)  iohexol  (OMNIPAQUE ) 350 MG/ML injection 75 mL (75 mLs Intravenous Contrast Given 07/09/24 0936)                                    Medical Decision Making Amount and/or Complexity of Data Reviewed Labs: ordered. Radiology: ordered.  Risk Prescription drug management.   [name] is here with cough shortness of breath.  History of lung cancer status post radiation treatment.  History of long smoking history.  Suspect some sort of undiagnosed COPD emphysema as well.  She has dry harsh cough on exam.  Some mild wheezing.  No signs of volume overload.  She is well-appearing otherwise.  Normal vitals.  No fever.  EKG shows sinus rhythm.  No ischemic changes.  Differential diagnosis likely bronchitis/viral process versus pneumonia.  I have no concern for PE ACS dissection or other acute issue given the history and physical.  Will give breathing treatment steroids get chest x-ray basic labs.  EKG reassuring.  No ischemic changes.  Doubt ACS.  She is not tachycardic she is not hypoxic have low suspicion for PE at this time.  Ultimately lab work showed no significant electrolyte abnormality.  Mild white count at 16.  COVID flu RSV test negative.  Chest x-ray somewhat equivocal so got a CT scan of the chest that showed no evidence of PE or infectious process.  She did have some interval increase in size of bilateral pulmonary nodules as well as  development of some new nodules in the left lower lobe.  She has previous history of lung cancer.  Will have her follow-up with her oncology team and primary care team.  But ultimately will treat her for bronchitis with steroids antibiotics and breathing treatments.  She feels much better after treatments here.  She got normal vitals.  Normal work of breathing.  Normal oxygenation.  She understands return if symptoms worsen.  Discharge.  This chart was dictated using voice recognition software.  Despite best efforts to proofread,  errors can occur which can change the documentation meaning.      Final diagnoses:  Viral upper respiratory tract infection  Bronchitis    ED Discharge Orders          Ordered    azithromycin  (ZITHROMAX ) 250 MG tablet  Daily        07/09/24 1032    predniSONE  (DELTASONE ) 20 MG tablet  Daily        07/09/24 1032               [place], DO 07/09/24 1033

## 2024-07-09 NOTE — ED Notes (Signed)
 Pt getting scan. Unable to medicate at this time

## 2024-07-09 NOTE — Discharge Instructions (Addendum)
 Follow-up with your oncology team your primary care team.  Take 2 to 4 puffs of albuterol  inhaler every 4-6 hours as needed.  Take his Zithromax  your antibiotic when you get home.  Take prednisone  dose tomorrow.

## 2024-07-09 NOTE — ED Triage Notes (Addendum)
 PT arrives via POV. PT c/o sob, productive cough and chills for the past 2 days. She arrives AxOx4. States she has been around her sick neighbor.

## 2024-07-15 DIAGNOSIS — E669 Obesity, unspecified: Secondary | ICD-10-CM | POA: Diagnosis not present

## 2024-07-15 DIAGNOSIS — M150 Primary generalized (osteo)arthritis: Secondary | ICD-10-CM | POA: Diagnosis not present

## 2024-07-15 DIAGNOSIS — I2584 Coronary atherosclerosis due to calcified coronary lesion: Secondary | ICD-10-CM | POA: Diagnosis not present

## 2024-07-15 DIAGNOSIS — I2510 Atherosclerotic heart disease of native coronary artery without angina pectoris: Secondary | ICD-10-CM | POA: Diagnosis not present

## 2024-07-15 DIAGNOSIS — E1169 Type 2 diabetes mellitus with other specified complication: Secondary | ICD-10-CM | POA: Diagnosis not present

## 2024-07-15 DIAGNOSIS — I10 Essential (primary) hypertension: Secondary | ICD-10-CM | POA: Diagnosis not present

## 2024-08-12 ENCOUNTER — Emergency Department (HOSPITAL_COMMUNITY)

## 2024-08-12 ENCOUNTER — Inpatient Hospital Stay (HOSPITAL_COMMUNITY)
Admission: EM | Admit: 2024-08-12 | Discharge: 2024-08-17 | DRG: 194 | Disposition: A | Discharge status: Home Health | Attending: Internal Medicine | Admitting: Internal Medicine

## 2024-08-12 ENCOUNTER — Other Ambulatory Visit: Payer: Self-pay

## 2024-08-12 DIAGNOSIS — Z923 Personal history of irradiation: Secondary | ICD-10-CM

## 2024-08-12 DIAGNOSIS — I5032 Chronic diastolic (congestive) heart failure: Secondary | ICD-10-CM | POA: Diagnosis present

## 2024-08-12 DIAGNOSIS — F17210 Nicotine dependence, cigarettes, uncomplicated: Secondary | ICD-10-CM | POA: Diagnosis present

## 2024-08-12 DIAGNOSIS — I110 Hypertensive heart disease with heart failure: Secondary | ICD-10-CM | POA: Diagnosis present

## 2024-08-12 DIAGNOSIS — I2510 Atherosclerotic heart disease of native coronary artery without angina pectoris: Secondary | ICD-10-CM | POA: Diagnosis present

## 2024-08-12 DIAGNOSIS — I700 Atherosclerosis of aorta: Secondary | ICD-10-CM | POA: Diagnosis present

## 2024-08-12 DIAGNOSIS — J189 Pneumonia, unspecified organism: Secondary | ICD-10-CM | POA: Diagnosis present

## 2024-08-12 DIAGNOSIS — Z90710 Acquired absence of both cervix and uterus: Secondary | ICD-10-CM

## 2024-08-12 DIAGNOSIS — Z1152 Encounter for screening for COVID-19: Secondary | ICD-10-CM

## 2024-08-12 DIAGNOSIS — E7800 Pure hypercholesterolemia, unspecified: Secondary | ICD-10-CM | POA: Diagnosis present

## 2024-08-12 DIAGNOSIS — K760 Fatty (change of) liver, not elsewhere classified: Secondary | ICD-10-CM | POA: Diagnosis present

## 2024-08-12 DIAGNOSIS — R0902 Hypoxemia: Secondary | ICD-10-CM | POA: Diagnosis present

## 2024-08-12 DIAGNOSIS — M1990 Unspecified osteoarthritis, unspecified site: Secondary | ICD-10-CM | POA: Diagnosis present

## 2024-08-12 DIAGNOSIS — Z801 Family history of malignant neoplasm of trachea, bronchus and lung: Secondary | ICD-10-CM

## 2024-08-12 DIAGNOSIS — J1008 Influenza due to other identified influenza virus with other specified pneumonia: Principal | ICD-10-CM | POA: Diagnosis present

## 2024-08-12 DIAGNOSIS — Z9049 Acquired absence of other specified parts of digestive tract: Secondary | ICD-10-CM

## 2024-08-12 DIAGNOSIS — Z85118 Personal history of other malignant neoplasm of bronchus and lung: Secondary | ICD-10-CM

## 2024-08-12 DIAGNOSIS — J40 Bronchitis, not specified as acute or chronic: Principal | ICD-10-CM

## 2024-08-12 DIAGNOSIS — F419 Anxiety disorder, unspecified: Secondary | ICD-10-CM | POA: Diagnosis present

## 2024-08-12 DIAGNOSIS — Z981 Arthrodesis status: Secondary | ICD-10-CM

## 2024-08-12 DIAGNOSIS — J440 Chronic obstructive pulmonary disease with acute lower respiratory infection: Secondary | ICD-10-CM | POA: Diagnosis present

## 2024-08-12 DIAGNOSIS — J159 Unspecified bacterial pneumonia: Secondary | ICD-10-CM | POA: Diagnosis present

## 2024-08-12 LAB — RESPIRATORY PANEL BY PCR

## 2024-08-12 LAB — COMPREHENSIVE METABOLIC PANEL WITH GFR
ALT: 24 U/L (ref 0–44)
AST: 47 U/L — ABNORMAL HIGH (ref 15–41)
Albumin: 3.9 g/dL (ref 3.5–5.0)
Alkaline Phosphatase: 61 U/L (ref 38–126)
Anion gap: 14 (ref 5–15)
BUN: 18 mg/dL (ref 8–23)
CO2: 23 mmol/L (ref 22–32)
Calcium: 8.6 mg/dL — ABNORMAL LOW (ref 8.9–10.3)
Chloride: 98 mmol/L (ref 98–111)
Creatinine, Ser: 0.95 mg/dL (ref 0.44–1.00)
GFR, Estimated: >60 mL/min
Glucose, Bld: 98 mg/dL (ref 70–99)
Potassium: 4.1 mmol/L (ref 3.5–5.1)
Sodium: 135 mmol/L (ref 135–145)
Total Bilirubin: 0.5 mg/dL (ref 0.0–1.2)
Total Protein: 9.4 g/dL — ABNORMAL HIGH (ref 6.5–8.1)

## 2024-08-12 LAB — CBC
HCT: 49.1 % — ABNORMAL HIGH (ref 36.0–46.0)
Hemoglobin: 15.5 g/dL — ABNORMAL HIGH (ref 12.0–15.0)
MCH: 31.6 pg (ref 26.0–34.0)
MCHC: 31.6 g/dL (ref 30.0–36.0)
MCV: 100.2 fL — ABNORMAL HIGH (ref 80.0–100.0)
Platelets: 191 K/uL (ref 150–400)
RBC: 4.9 MIL/uL (ref 3.87–5.11)
RDW: 14 % (ref 11.5–15.5)
WBC: 11.2 K/uL — ABNORMAL HIGH (ref 4.0–10.5)
nRBC: 0 % (ref 0.0–0.2)

## 2024-08-12 LAB — RESP PANEL BY RT-PCR (RSV, FLU A&B, COVID)  RVPGX2
Influenza A by PCR: POSITIVE — AB
Influenza B by PCR: NEGATIVE
Resp Syncytial Virus by PCR: NEGATIVE
SARS Coronavirus 2 by RT PCR: NEGATIVE

## 2024-08-12 LAB — PRO BRAIN NATRIURETIC PEPTIDE: Pro Brain Natriuretic Peptide: 2139 pg/mL — ABNORMAL HIGH

## 2024-08-12 MED ORDER — ACETAMINOPHEN 500 MG PO TABS
1000.0000 mg | ORAL_TABLET | Freq: Once | ORAL | Status: AC
  Administered 2024-08-12: 1000 mg via ORAL
  Filled 2024-08-12: qty 2

## 2024-08-12 MED ORDER — SODIUM CHLORIDE 0.9 % IV SOLN
500.0000 mg | Freq: Once | INTRAVENOUS | Status: AC
  Administered 2024-08-12: 500 mg via INTRAVENOUS
  Filled 2024-08-12: qty 5

## 2024-08-12 MED ORDER — ALBUTEROL SULFATE (2.5 MG/3ML) 0.083% IN NEBU
3.0000 mL | INHALATION_SOLUTION | RESPIRATORY_TRACT | Status: DC | PRN
  Administered 2024-08-13 – 2024-08-15 (×4): 3 mL via RESPIRATORY_TRACT

## 2024-08-12 MED ORDER — FUROSEMIDE 10 MG/ML IJ SOLN
20.0000 mg | Freq: Once | INTRAMUSCULAR | Status: AC
  Administered 2024-08-12: 20 mg via INTRAVENOUS
  Filled 2024-08-12: qty 4

## 2024-08-12 MED ORDER — SODIUM CHLORIDE 0.9 % IV SOLN
2.0000 g | INTRAVENOUS | Status: AC
  Administered 2024-08-13 – 2024-08-16 (×4): 2 g via INTRAVENOUS

## 2024-08-12 MED ORDER — AZITHROMYCIN 250 MG PO TABS
500.0000 mg | ORAL_TABLET | Freq: Every day | ORAL | Status: AC
  Administered 2024-08-13 – 2024-08-16 (×4): 500 mg via ORAL

## 2024-08-12 MED ORDER — ALPRAZOLAM 0.25 MG PO TABS: 0.1250 mg | ORAL_TABLET | Freq: Four times a day (QID) | ORAL | Status: DC | PRN

## 2024-08-12 MED ORDER — SODIUM CHLORIDE 0.9 % IV SOLN
1.0000 g | Freq: Once | INTRAVENOUS | Status: AC
  Administered 2024-08-12: 1 g via INTRAVENOUS
  Filled 2024-08-12: qty 10

## 2024-08-12 MED ORDER — IOHEXOL 300 MG/ML  SOLN
100.0000 mL | Freq: Once | INTRAMUSCULAR | Status: AC | PRN
  Administered 2024-08-12: 70 mL via INTRAVENOUS

## 2024-08-12 MED ORDER — OSELTAMIVIR PHOSPHATE 75 MG PO CAPS
75.0000 mg | ORAL_CAPSULE | Freq: Two times a day (BID) | ORAL | Status: DC
  Administered 2024-08-12: 75 mg via ORAL
  Filled 2024-08-12: qty 1

## 2024-08-12 MED ORDER — ENOXAPARIN SODIUM 40 MG/0.4ML IJ SOSY
40.0000 mg | PREFILLED_SYRINGE | INTRAMUSCULAR | Status: DC
  Administered 2024-08-12 – 2024-08-16 (×5): 40 mg via SUBCUTANEOUS
  Filled 2024-08-12: qty 0.4

## 2024-08-12 NOTE — ED Provider Triage Note (Signed)
 Emergency Medicine Provider Triage Evaluation Note  [name] , a 73 y.o. female  was evaluated in triage.  Pt complains of SOB, cough, dry 1.5 months seems worse.  Review of Systems  Positive: Cough  Negative: fever  Physical Exam  BP (!) 149/79 (BP Location: Right Arm)   Pulse 86   Temp 98.4 F (36.9 C) (Oral)   Resp 20   SpO2 92%  Gen:   Awake, no distress   Resp:  Normal effort  MSK:   Moves extremities without difficulty  Other:    Medical Decision Making  Medically screening exam initiated at 7:55 AM.  Appropriate orders placed.  [name] was informed that the remainder of the evaluation will be completed by another provider, this initial triage assessment does not replace that evaluation, and the importance of remaining in the ED until their evaluation is complete.  Had PE study for this about a month ago and negative.    [name] [name], [place] 08/12/24 [phone]

## 2024-08-12 NOTE — H&P (Addendum)
 " History and Physical    [name] FMW:[id number] DOB: [birth year] DOA: 08/12/2024  I have briefly reviewed the patient's prior medical records in [facility]  PCP: Vernon Velna SAUNDERS, MD  Patient coming from: home  Chief Complaint: Shortness of breath, cough  HPI: [name] is a 73 y.o. female with medical history significant of stage Ia lung cancer status post SBRT to 3 pulmonary nodules in 2025, currently being monitored by Dr. Gatha, HTN, HLD, prediabetes who comes into the hospital with complaints of subacute shortness of breath and cough.  She initially presented to the ED about 4 weeks ago, was given antibiotics and sent home.  She has completed antibiotics but has been experiencing persistent cough, shortness of breath for the last several weeks, and finally got worse in the last couple of days and decided to present to the emergency room.  She continues to smoke but has not been able to do so for the past couple weeks.  She denies any subjective fever or chills.  She has no abdominal pain, no nausea or vomiting.  ED Course: In the emergency room she was found to be febrile to 101, normotensive, satting well on room air at rest but desats into the 80s with ambulation.  She was found to have mild leukocytosis with white count of 11.2 and an elevated proBNP to 2139.  A CT scan of the chest showed bronchial wall thickening with few scattered ground glass and tree-in-bud nodular densities which may be infectious or inflammatory.  She was given ceftriaxone  azithromycin  and we are asked to admit  Review of Systems: All systems reviewed, and apart from HPI, all negative  Past Medical History:  Diagnosis Date   Arthritis    Cervical spinal stenosis    Diverticulitis    Family history of anesthesia complication    Sister has nausea and vomitting.   Hypercholesteremia    Hypertension    Pre-diabetes     Past Surgical History:  Procedure Laterality Date   ABDOMINAL  HYSTERECTOMY  1995   ANTERIOR CERVICAL DECOMP/DISCECTOMY FUSION  12/30/2011   BACK SURGERY  1992   BRONCHIAL BIOPSY  09/05/2023   Procedure: BRONCHIAL BIOPSIES;  Surgeon: Shelah Lamar RAMAN, MD;  Location: [facility] ENDOSCOPY;  Service: Pulmonary;;   BRONCHIAL BRUSHINGS  09/05/2023   Procedure: BRONCHIAL BRUSHINGS;  Surgeon: Shelah Lamar RAMAN, MD;  Location: [facility] ENDOSCOPY;  Service: Pulmonary;;   BRONCHIAL NEEDLE ASPIRATION BIOPSY  09/05/2023   Procedure: BRONCHIAL NEEDLE ASPIRATION BIOPSIES;  Surgeon: Shelah Lamar RAMAN, MD;  Location: [facility] ENDOSCOPY;  Service: Pulmonary;;   COLECTOMY  2004   lost 1/3 of my colon   COLONOSCOPY WITH PROPOFOL  N/A 08/28/2021   Procedure: COLONOSCOPY WITH PROPOFOL ;  Surgeon: Rollin Dover, MD;  Location: [facility] ENDOSCOPY;  Service: Endoscopy;  Laterality: N/A;   FIDUCIAL MARKER PLACEMENT  09/05/2023   Procedure: FIDUCIAL MARKER PLACEMENT;  Surgeon: Shelah Lamar RAMAN, MD;  Location: [facility] ENDOSCOPY;  Service: Pulmonary;;   HEMOSTASIS CLIP PLACEMENT  08/28/2021   Procedure: HEMOSTASIS CLIP PLACEMENT;  Surgeon: Rollin Dover, MD;  Location: [facility] ENDOSCOPY;  Service: Endoscopy;;   HOT HEMOSTASIS N/A 08/28/2021   Procedure: HOT HEMOSTASIS (ARGON PLASMA COAGULATION/BICAP);  Surgeon: Rollin Dover, MD;  Location: [facility]COPY;  Service: Endoscopy;  Laterality: N/A;   POLYPECTOMY  08/28/2021   Procedure: POLYPECTOMY;  Surgeon: Rollin Dover, MD;  Location: [facility] ENDOSCOPY;  Service: Endoscopy;;   THORACIC FUSION  1992   TUMOR REMOVAL     small intestine  reports that she has been smoking cigarettes. She started smoking about 55 years ago. She has a 55.3 pack-year smoking history. She has never used smokeless tobacco. She reports that she does not drink alcohol  and does not use drugs.  Allergies[1]  Family History  Problem Relation Age of Onset   Anesthesia problems Sister    Allergic rhinitis Sister    Food Allergy Sister        shellfish   Lung cancer Sister        smoker   Angioedema  Neg Hx    Asthma Neg Hx    Eczema Neg Hx    Urticaria Neg Hx    Breast cancer Neg Hx     Prior to Admission medications  Medication Sig Start Date End Date Taking? Authorizing Provider  acetaminophen  (TYLENOL ) 500 MG tablet Take 500 mg by mouth every 6 (six) hours as needed for mild pain, moderate pain, fever or headache.    [provider]  ALPRAZolam  (XANAX ) 0.5 MG tablet Take 0.25 tablets by mouth every 6 (six) hours as needed for anxiety or sleep. anxiety 02/20/15   [provider]  Ascorbic Acid  (VITAMIN C  PO) Take 1 tablet by mouth 3 (three) times a week.    [provider]  azithromycin  (ZITHROMAX ) 250 MG tablet Take 1 tablet (250 mg total) by mouth daily. Take first 2 tablets together, then 1 every day until finished. 07/09/24   Ruthe Cornet, DO  BOOSTRIX 5-2.5-18.5 LF-MCG/0.5 injection  05/11/23   [provider]  Cholecalciferol (VITAMIN D ) 50 MCG (2000 UT) tablet Take 2,000 Units by mouth daily.     [provider]  Ferrous Sulfate  (SLOW FE) 142 (45 Fe) MG TBCR Take 1 tablet by mouth 3 (three) times a week.    [provider]  methocarbamol  (ROBAXIN ) 500 MG tablet Take 1 tablet (500 mg total) by mouth 2 (two) times daily as needed for muscle spasms. 05/26/23   Davis, Jonathon H, MD  Multiple Vitamins-Iron (MULTI-VITAMIN/IRON PO) Take 1 tablet by mouth 3 (three) times a week.    [provider]    Physical Exam: Vitals:   08/12/24 0746 08/12/24 1130  BP: (!) 149/79 138/85  Pulse: 86 82  Resp: 20 18  Temp: 98.4 F (36.9 C) (!) 101 F (38.3 C)  TempSrc: Oral Oral  SpO2: 92% 92%    Constitutional: NAD, calm, comfortable Eyes: PERRL, lids and conjunctivae normal ENMT: Mucous membranes are moist. Posterior pharynx clear of any exudate or lesions.Normal dentition.  Neck: normal, supple Respiratory: Faint rhonchi at the bases, no wheezing heard.  Normal respiratory effort Cardiovascular: Regular rate and rhythm, no  murmurs / rubs / gallops. No extremity edema.  Abdomen: no tenderness, no masses palpated. Bowel sounds positive.  Musculoskeletal: no clubbing / cyanosis. Normal muscle tone.  Skin: no rashes, lesions, ulcers. No induration Neurologic: No focal deficits  Labs on Admission: I have personally reviewed following labs and imaging studies  CBC: Recent Labs  Lab 08/12/24 1221  WBC 11.2*  HGB 15.5*  HCT 49.1*  MCV 100.2*  PLT 191   Basic Metabolic Panel: Recent Labs  Lab 08/12/24 1221  NA 135  K 4.1  CL 98  CO2 23  GLUCOSE 98  BUN 18  CREATININE 0.95  CALCIUM  8.6*   Liver Function Tests: Recent Labs  Lab 08/12/24 1221  AST 47*  ALT 24  ALKPHOS 61  BILITOT 0.5  PROT 9.4*  ALBUMIN 3.9   Coagulation Profile:  No results for input(s): INR, PROTIME in the last 168 hours. BNP (last 3 results) Recent Labs    08/12/24 1221  PROBNP 2,139.0*   CBG: No results for input(s): GLUCAP in the last 168 hours. Thyroid  Function Tests: No results for input(s): TSH, T4TOTAL, FREET4, T3FREE, THYROIDAB in the last 72 hours. Urine analysis:    Component Value Date/Time   COLORURINE YELLOW 05/26/2023 0440   APPEARANCEUR CLEAR 05/26/2023 0440   LABSPEC 1.011 05/26/2023 0440   PHURINE 5.0 05/26/2023 0440   GLUCOSEU NEGATIVE 05/26/2023 0440   HGBUR NEGATIVE 05/26/2023 0440   BILIRUBINUR NEGATIVE 05/26/2023 0440   KETONESUR NEGATIVE 05/26/2023 0440   PROTEINUR NEGATIVE 05/26/2023 0440   UROBILINOGEN 0.2 06/11/2014 1651   NITRITE NEGATIVE 05/26/2023 0440   LEUKOCYTESUR NEGATIVE 05/26/2023 0440     Radiological Exams on Admission: CT Chest W Contrast Result Date: 08/12/2024 CLINICAL DATA:  Chronic/persisting cough. Dyspnea. Pneumonia, complication suspected. EXAM: CT CHEST WITH CONTRAST TECHNIQUE: Multidetector CT imaging of the chest was performed during intravenous contrast administration. RADIATION DOSE REDUCTION: This exam was performed according to the  departmental dose-optimization program which includes automated exposure control, adjustment of the mA and/or kV according to patient size and/or use of iterative reconstruction technique. CONTRAST:  70mL OMNIPAQUE  IOHEXOL  300 MG/ML  SOLN COMPARISON:  07/09/2024. FINDINGS: Cardiovascular: The heart is normal in size and there is no pericardial effusion. Three-vessel coronary artery calcifications are noted. There is atherosclerotic calcification of the aorta without evidence of aneurysm. The pulmonary trunk is normal in caliber. Mediastinum/Nodes: Prominent lymph nodes are present in the mediastinum and hilar regions bilaterally, measuring up to 1.3 cm in the precarinal space. No axillary lymphadenopathy. The thyroid  gland, trachea, and esophagus are within normal limits. Lungs/Pleura: Minimal apical pleural scarring is noted bilaterally. A few scattered scattered tree-in-bud nodular densities are present in the left lower lobe and right upper lobe. Bronchial wall thickening is noted bilaterally. There are few scattered ground-glass opacities, possibly representing residual infiltrate. No effusion or pneumothorax is seen. There is a stable 9 mm nodule in the right middle lobe, axial image 78. There is an irregular opacity measuring 1.5 cm with surrounding ground-glass and fiducial marker in the right upper lobe, axial image 46. There is an irregular opacity in the right middle lobe measuring 9 mm with adjacent fiducial marker, axial image 95. There is a 10 mm nodule with a fiducial marker in the left upper lobe, axial image 75. There are nodular opacities in the left lower lobe measuring 1.2 cm, axial image 96, 9 mm axial image 95 and 6 mm axial image 97, not well seen on the previous exam due to infiltrates. Upper Abdomen: Fatty infiltration of the liver is noted. Cysts are noted in the left kidney. Surgical clips are present in the left upper quadrant. No acute abnormality. Musculoskeletal: Cervical spinal fusion  hardware is noted. There are degenerative changes in the thoracic spine. No acute osseous abnormality. IMPRESSION: 1. Bronchial wall thickening with a few scattered ground-glass and tree-in-bud nodular densities, which may be infectious or inflammatory. 2. Bilateral pulmonary nodules, stable to increased in size from the prior exam. 3. Coronary artery calcifications. 4. Aortic atherosclerosis. 5. Hepatic steatosis. Electronically Signed   By: Leita Birmingham M.D.   On: 08/12/2024 16:06   DG Chest Port 1 View Result Date: 08/12/2024 CLINICAL DATA:  Cough.  Shortness of breath. EXAM: PORTABLE CHEST 1 VIEW COMPARISON:  07/09/2024 FINDINGS: The heart size and mediastinal contours are within normal limits. A few  brachytherapy seeds are again seen in both lungs. Mild right upper lobe scarring is stable. The lungs are otherwise clear. Cervical spine fusion hardware again noted. IMPRESSION: No active disease. Electronically Signed   By: Norleen DELENA Kil M.D.   On: 08/12/2024 08:46    EKG: Independently reviewed.  Sinus rhythm  Assessment/Plan Principal problem Possible community-acquired pneumonia -based on imaging, symptoms, also mild leukocytosis as well as the fact that she had a fever.  She has been having symptoms over the last 4 weeks, but if this would have been a chronic issue not sure why she would be febrile now - Obtain COVID, flu, RSV, full RVP - Has been placed on ceftriaxone  and azithromycin , continue - Due to elevated BNP, asymmetric edema could be a consideration, obtain a 2D echo and give Lasix  x 1.  Has no significant peripheral edema that I could appreciate  Addendum: Flu a is positive. Stat tamiflu   Active problems History of stage Ia lung cancer -seen by oncology and radiation oncology, status post SBRT about 10 months ago in February/March 2025.  This in theory can also represent radiation pneumonitis, but it would have to be in the late phase past 6 months  History of anxiety-continue  home Xanax   Tobacco use-quit about 2 weeks ago.  Counseled for cessation  DVT prophylaxis: Lovenox  Code Status: Full code per patient Family Communication: No family at bedside Bed Type: Telemetry Consults called: None Obs/Inp: Observation   Nilda Fendt, MD, PhD [facility]  Contact via [email or web address]  08/12/2024, 4:49 PM         [1]  Allergies Allergen Reactions   Lyrica [Pregabalin] Other (See Comments)    Made symptoms worse   Simvastatin  Other (See Comments)    Myalgias   Statins Other (See Comments)    Myalgias   Meloxicam Other (See Comments)    Makes her sweat and muscle cramps    "

## 2024-08-12 NOTE — ED Provider Notes (Signed)
 " [place] EMERGENCY DEPARTMENT AT [facility] Provider Note   CSN: [id number] Arrival date & time: 08/12/24  9264     History  Chief Complaint  Patient presents with   Shortness of Breath   Cough    [name] is a 73 y.o. female with PMH as listed below who presents with SOB, cough, dry 1.5 months seems worse. She states she can't get rid of the bronchitis and came here hoping for an antibiotic. She is still febrile. She doesn't have COPD. Has h/o lung cancer in remission. No leg swelling or chest pain. Wet cough and she feels miserable.  Feels worse now than she felt one month ago.  Had CT PE on 07/09/24 which showed: 1. No CT evidence of pulmonary artery embolus. 2. Interval increase in the size of the bilateral pulmonary nodules as well as development of new nodules in the left lower lobe. 3.  Aortic Atherosclerosis (ICD10-I70.0).  Past Medical History:  Diagnosis Date   Arthritis    Cervical spinal stenosis    Diverticulitis    Family history of anesthesia complication    Sister has nausea and vomitting.   Hypercholesteremia    Hypertension    Pre-diabetes        Home Medications Prior to Admission medications  Medication Sig Start Date End Date Taking? Authorizing Provider  acetaminophen  (TYLENOL ) 500 MG tablet Take 500 mg by mouth every 6 (six) hours as needed for mild pain, moderate pain, fever or headache.    [provider]  ALPRAZolam  (XANAX ) 0.5 MG tablet Take 0.25 tablets by mouth every 6 (six) hours as needed for anxiety or sleep. anxiety 02/20/15   [provider]  Ascorbic Acid  (VITAMIN C  PO) Take 1 tablet by mouth 3 (three) times a week.    [provider]  azithromycin  (ZITHROMAX ) 250 MG tablet Take 1 tablet (250 mg total) by mouth daily. Take first 2 tablets together, then 1 every day until finished. 07/09/24   Ruthe Cornet, DO  BOOSTRIX 5-2.5-18.5 LF-MCG/0.5 injection  05/11/23   [provider]   Cholecalciferol (VITAMIN D ) 50 MCG (2000 UT) tablet Take 2,000 Units by mouth daily.     [provider]  Ferrous Sulfate  (SLOW FE) 142 (45 Fe) MG TBCR Take 1 tablet by mouth 3 (three) times a week.    [provider]  methocarbamol  (ROBAXIN ) 500 MG tablet Take 1 tablet (500 mg total) by mouth 2 (two) times daily as needed for muscle spasms. 05/26/23   Davis, Jonathon H, MD  Multiple Vitamins-Iron (MULTI-VITAMIN/IRON PO) Take 1 tablet by mouth 3 (three) times a week.    [provider]      Allergies    Lyrica [pregabalin], Simvastatin , Statins, and Meloxicam    Review of Systems   Review of Systems A 10 point review of systems was performed and is negative unless otherwise reported in HPI.  Physical Exam Updated Vital Signs BP 131/71 (BP Location: Right Arm)   Pulse 71   Temp 98.9 F (37.2 C) (Oral)   Resp 20   SpO2 93%  Physical Exam General: Uncomfortable and tired-appearing elderly female, lying in bed.  HEENT: PERRLA, Sclera anicteric, MMM, trachea midline.  Cardiology: RRR, no murmurs/rubs/gallops.  Resp: Normal respiratory rate and effort. Persistent wet cough and rhonchi bilaterally.  Abd: Soft, non-tender, non-distended. No rebound tenderness or guarding.  GU: Deferred. MSK: No peripheral edema or signs of trauma. Skin: warm, dry.  Neuro: A&Ox4, CNs II-XII  grossly intact. MAEs. Sensation grossly intact.  Psych: Normal mood and affect.   ED Results / Procedures / Treatments   Labs (all labs ordered are listed, but only abnormal results are displayed) Labs Reviewed  RESP PANEL BY RT-PCR (RSV, FLU A&B, COVID)  RVPGX2 - Abnormal; Notable for the following components:      Result Value   Influenza A by PCR POSITIVE (*)    All other components within normal limits  COMPREHENSIVE METABOLIC PANEL WITH GFR - Abnormal; Notable for the following components:   Calcium  8.6 (*)    Total Protein 9.4 (*)    AST 47 (*)    All other components within  normal limits  CBC - Abnormal; Notable for the following components:   WBC 11.2 (*)    Hemoglobin 15.5 (*)    HCT 49.1 (*)    MCV 100.2 (*)    All other components within normal limits  PRO BRAIN NATRIURETIC PEPTIDE - Abnormal; Notable for the following components:   Pro Brain Natriuretic Peptide 2,139.0 (*)    All other components within normal limits  CULTURE, BLOOD (ROUTINE X 2)  CULTURE, BLOOD (ROUTINE X 2)  RESPIRATORY PANEL BY PCR  EXPECTORATED SPUTUM ASSESSMENT W GRAM STAIN, RFLX TO RESP C  HIV ANTIBODY (ROUTINE TESTING W REFLEX)  STREP PNEUMONIAE URINARY ANTIGEN  LEGIONELLA PNEUMOPHILA SEROGP 1 UR AG    EKG EKG Interpretation Date/Time:  Sunday August 12 2024 07:44:11 EST Ventricular Rate:  89 PR Interval:  206 QRS Duration:  104 QT Interval:  377 QTC Calculation: 459 R Axis:   -69  Text Interpretation: Sinus or ectopic atrial rhythm Left anterior fascicular block Anterior infarct, old No significant change since last tracing Confirmed by Ellouise Fine ([id number]) on 08/12/2024 10:20:26 AM  Radiology CT Chest W Contrast Result Date: 08/12/2024 CLINICAL DATA:  Chronic/persisting cough. Dyspnea. Pneumonia, complication suspected. EXAM: CT CHEST WITH CONTRAST TECHNIQUE: Multidetector CT imaging of the chest was performed during intravenous contrast administration. RADIATION DOSE REDUCTION: This exam was performed according to the departmental dose-optimization program which includes automated exposure control, adjustment of the mA and/or kV according to patient size and/or use of iterative reconstruction technique. CONTRAST:  70mL OMNIPAQUE  IOHEXOL  300 MG/ML  SOLN COMPARISON:  07/09/2024. FINDINGS: Cardiovascular: The heart is normal in size and there is no pericardial effusion. Three-vessel coronary artery calcifications are noted. There is atherosclerotic calcification of the aorta without evidence of aneurysm. The pulmonary trunk is normal in caliber. Mediastinum/Nodes:  Prominent lymph nodes are present in the mediastinum and hilar regions bilaterally, measuring up to 1.3 cm in the precarinal space. No axillary lymphadenopathy. The thyroid  gland, trachea, and esophagus are within normal limits. Lungs/Pleura: Minimal apical pleural scarring is noted bilaterally. A few scattered scattered tree-in-bud nodular densities are present in the left lower lobe and right upper lobe. Bronchial wall thickening is noted bilaterally. There are few scattered ground-glass opacities, possibly representing residual infiltrate. No effusion or pneumothorax is seen. There is a stable 9 mm nodule in the right middle lobe, axial image 78. There is an irregular opacity measuring 1.5 cm with surrounding ground-glass and fiducial marker in the right upper lobe, axial image 46. There is an irregular opacity in the right middle lobe measuring 9 mm with adjacent fiducial marker, axial image 95. There is a 10 mm nodule with a fiducial marker in the left upper lobe, axial image 75. There are nodular opacities in the left lower lobe measuring 1.2 cm, axial image 96, 9 mm  axial image 95 and 6 mm axial image 97, not well seen on the previous exam due to infiltrates. Upper Abdomen: Fatty infiltration of the liver is noted. Cysts are noted in the left kidney. Surgical clips are present in the left upper quadrant. No acute abnormality. Musculoskeletal: Cervical spinal fusion hardware is noted. There are degenerative changes in the thoracic spine. No acute osseous abnormality. IMPRESSION: 1. Bronchial wall thickening with a few scattered ground-glass and tree-in-bud nodular densities, which may be infectious or inflammatory. 2. Bilateral pulmonary nodules, stable to increased in size from the prior exam. 3. Coronary artery calcifications. 4. Aortic atherosclerosis. 5. Hepatic steatosis. Electronically Signed   By: Leita Birmingham M.D.   On: 08/12/2024 16:06   DG Chest Port 1 View Result Date: 08/12/2024 CLINICAL DATA:   Cough.  Shortness of breath. EXAM: PORTABLE CHEST 1 VIEW COMPARISON:  07/09/2024 FINDINGS: The heart size and mediastinal contours are within normal limits. A few brachytherapy seeds are again seen in both lungs. Mild right upper lobe scarring is stable. The lungs are otherwise clear. Cervical spine fusion hardware again noted. IMPRESSION: No active disease. Electronically Signed   By: Norleen DELENA Kil M.D.   On: 08/12/2024 08:46    Procedures Procedures    Medications Ordered in ED Medications  enoxaparin  (LOVENOX ) injection 40 mg (has no administration in time range)  cefTRIAXone  (ROCEPHIN ) 2 g in sodium chloride  0.9 % 100 mL IVPB (has no administration in time range)  azithromycin  (ZITHROMAX ) tablet 500 mg (has no administration in time range)  ALPRAZolam  (XANAX ) tablet 0.125 mg (has no administration in time range)  oseltamivir  (TAMIFLU ) capsule 75 mg (has no administration in time range)  acetaminophen  (TYLENOL ) tablet 1,000 mg (1,000 mg Oral Given 08/12/24 1403)  iohexol  (OMNIPAQUE ) 300 MG/ML solution 100 mL (70 mLs Intravenous Contrast Given 08/12/24 1426)  cefTRIAXone  (ROCEPHIN ) 1 g in sodium chloride  0.9 % 100 mL IVPB (0 g Intravenous Stopped 08/12/24 2016)  azithromycin  (ZITHROMAX ) 500 mg in sodium chloride  0.9 % 250 mL IVPB (0 mg Intravenous Stopped 08/12/24 2029)  furosemide  (LASIX ) injection 20 mg (20 mg Intravenous Given 08/12/24 1757)    ED Course/ Medical Decision Making/ A&P                          Medical Decision Making Amount and/or Complexity of Data Reviewed Labs:  Decision-making details documented in ED Course. Radiology: ordered. Decision-making details documented in ED Course.  Risk OTC drugs. Prescription drug management. Decision regarding hospitalization.    This patient presents to the ED for concern of persistent and worsening fever/cough, this involves an extensive number of treatment options, and is a complaint that carries with it a high risk of  complications and morbidity.  I considered the following differential and admission for this acute, potentially life threatening condition. Pt is febrile to 101 F but otherwise well appearing.   MDM:    DDX for dyspnea/cough includes but is not limited to:  Febrile to 101F, mild leukocytosis. Consider possible viral infection such as influenza, or occult PNA. She has negative CXR but feels worse than she felt one month ago. Discussed with patient. Will perform another CT chest to r/o occult PNA and bacterial infection/SIRS. No wheezing, no h/o COPD, doubt obstructive lung disease. No CP, just had a negative PE study, doubt PE, no signs of DVT on exam. No leg edema or pulm edema on CXR to indicate CHF exacerbation. No CP or signs of ischemia  on EKG to suggest ACS. Will give tylenol  for fever.   Clinical Course as of 08/12/24 2107  Sun Aug 12, 2024  1316 WBC(!): 11.2 Mild leukocytosis [HN]  1316 DG Chest Port 1 View No active disease. [HN]  1609 CT Chest W Contrast 1. Bronchial wall thickening with a few scattered ground-glass and tree-in-bud nodular densities, which may be infectious or inflammatory. 2. Bilateral pulmonary nodules, stable to increased in size from the prior exam. 3. Coronary artery calcifications. 4. Aortic atherosclerosis. 5. Hepatic steatosis.   [HN]  1611 SpO2: 92 % [HN]  1611 O2 Device: Room Air [HN]  1631 Desatted into 80s% w/ ambulation. CT showing possible opacities/densities could be c/w infection. W/ fever/leukocytosis, could be viral but also could be bacterial. Will give IV abx and admit to medicine. [HN]    Clinical Course User Index [HN] Franklyn Sid SAILOR, MD    Labs: I Ordered, and personally interpreted labs.  The pertinent results include:  those listed above  Imaging Studies ordered: I ordered imaging studies including CXR, CT chest w contrast I independently visualized and interpreted imaging. I agree with the radiologist  interpretation  Additional history obtained from chart review.    Reevaluation: After the interventions noted above, I reevaluated the patient and found that they have :stayed the same  Social Determinants of Health:  lives independently  Disposition:  Admit to medicine  Co morbidities that complicate the patient evaluation  Past Medical History:  Diagnosis Date   Arthritis    Cervical spinal stenosis    Diverticulitis    Family history of anesthesia complication    Sister has nausea and vomitting.   Hypercholesteremia    Hypertension    Pre-diabetes      Medicines Meds ordered this encounter  Medications   acetaminophen  (TYLENOL ) tablet 1,000 mg   iohexol  (OMNIPAQUE ) 300 MG/ML solution 100 mL   cefTRIAXone  (ROCEPHIN ) 1 g in sodium chloride  0.9 % 100 mL IVPB    Antibiotic Indication::   CAP   azithromycin  (ZITHROMAX ) 500 mg in sodium chloride  0.9 % 250 mL IVPB    Antibiotic Indication::   CAP   enoxaparin  (LOVENOX ) injection 40 mg   cefTRIAXone  (ROCEPHIN ) 2 g in sodium chloride  0.9 % 100 mL IVPB    Antibiotic Indication::   CAP   azithromycin  (ZITHROMAX ) tablet 500 mg   ALPRAZolam  (XANAX ) tablet 0.125 mg    anxiety     furosemide  (LASIX ) injection 20 mg   oseltamivir  (TAMIFLU ) capsule 75 mg    I have reviewed the patients home medicines and have made adjustments as needed  Problem List / ED Course: Problem List Items Addressed This Visit       Respiratory   * (Principal) CAP (community acquired pneumonia)   Relevant Medications   cefTRIAXone  (ROCEPHIN ) 2 g in sodium chloride  0.9 % 100 mL IVPB (Start on 08/13/2024 12:00 PM)   azithromycin  (ZITHROMAX ) tablet 500 mg (Start on 08/13/2024 10:00 AM)   oseltamivir  (TAMIFLU ) capsule 75 mg (Start on 08/12/2024 10:00 PM)   Other Visit Diagnoses       Bronchitis    -  Primary                   This note was created using dictation software, which may contain spelling or grammatical errors.    Franklyn Sid SAILOR, MD 08/12/24 2107  "

## 2024-08-12 NOTE — ED Triage Notes (Signed)
 Pt ambulatory to triage with complaints of shortness of breath that began in November. Pt endorses a congested cough. Pt denies chest pain. PT states that she was given cough capsules that have no helped.

## 2024-08-13 ENCOUNTER — Observation Stay (HOSPITAL_COMMUNITY)

## 2024-08-13 DIAGNOSIS — I700 Atherosclerosis of aorta: Secondary | ICD-10-CM | POA: Diagnosis present

## 2024-08-13 DIAGNOSIS — Z85118 Personal history of other malignant neoplasm of bronchus and lung: Secondary | ICD-10-CM | POA: Diagnosis not present

## 2024-08-13 DIAGNOSIS — F17210 Nicotine dependence, cigarettes, uncomplicated: Secondary | ICD-10-CM | POA: Diagnosis present

## 2024-08-13 DIAGNOSIS — F419 Anxiety disorder, unspecified: Secondary | ICD-10-CM | POA: Diagnosis present

## 2024-08-13 DIAGNOSIS — Z801 Family history of malignant neoplasm of trachea, bronchus and lung: Secondary | ICD-10-CM | POA: Diagnosis not present

## 2024-08-13 DIAGNOSIS — J189 Pneumonia, unspecified organism: Secondary | ICD-10-CM | POA: Diagnosis not present

## 2024-08-13 DIAGNOSIS — I2510 Atherosclerotic heart disease of native coronary artery without angina pectoris: Secondary | ICD-10-CM | POA: Diagnosis present

## 2024-08-13 DIAGNOSIS — I110 Hypertensive heart disease with heart failure: Secondary | ICD-10-CM | POA: Diagnosis present

## 2024-08-13 DIAGNOSIS — M1990 Unspecified osteoarthritis, unspecified site: Secondary | ICD-10-CM | POA: Diagnosis present

## 2024-08-13 DIAGNOSIS — I5031 Acute diastolic (congestive) heart failure: Secondary | ICD-10-CM | POA: Diagnosis not present

## 2024-08-13 DIAGNOSIS — R0902 Hypoxemia: Secondary | ICD-10-CM | POA: Diagnosis present

## 2024-08-13 DIAGNOSIS — J159 Unspecified bacterial pneumonia: Secondary | ICD-10-CM | POA: Diagnosis present

## 2024-08-13 DIAGNOSIS — Z981 Arthrodesis status: Secondary | ICD-10-CM | POA: Diagnosis not present

## 2024-08-13 DIAGNOSIS — J1100 Influenza due to unidentified influenza virus with unspecified type of pneumonia: Secondary | ICD-10-CM | POA: Diagnosis not present

## 2024-08-13 DIAGNOSIS — Z923 Personal history of irradiation: Secondary | ICD-10-CM | POA: Diagnosis not present

## 2024-08-13 DIAGNOSIS — Z1152 Encounter for screening for COVID-19: Secondary | ICD-10-CM | POA: Diagnosis not present

## 2024-08-13 DIAGNOSIS — J440 Chronic obstructive pulmonary disease with acute lower respiratory infection: Secondary | ICD-10-CM | POA: Diagnosis present

## 2024-08-13 DIAGNOSIS — Z90710 Acquired absence of both cervix and uterus: Secondary | ICD-10-CM | POA: Diagnosis not present

## 2024-08-13 DIAGNOSIS — J09X1 Influenza due to identified novel influenza A virus with pneumonia: Secondary | ICD-10-CM | POA: Diagnosis not present

## 2024-08-13 DIAGNOSIS — K760 Fatty (change of) liver, not elsewhere classified: Secondary | ICD-10-CM | POA: Diagnosis present

## 2024-08-13 DIAGNOSIS — E7800 Pure hypercholesterolemia, unspecified: Secondary | ICD-10-CM | POA: Diagnosis present

## 2024-08-13 DIAGNOSIS — J1008 Influenza due to other identified influenza virus with other specified pneumonia: Secondary | ICD-10-CM | POA: Diagnosis present

## 2024-08-13 DIAGNOSIS — Z9049 Acquired absence of other specified parts of digestive tract: Secondary | ICD-10-CM | POA: Diagnosis not present

## 2024-08-13 DIAGNOSIS — I5032 Chronic diastolic (congestive) heart failure: Secondary | ICD-10-CM | POA: Diagnosis present

## 2024-08-13 DIAGNOSIS — R0602 Shortness of breath: Secondary | ICD-10-CM | POA: Diagnosis present

## 2024-08-13 LAB — BLOOD CULTURE ID PANEL (REFLEXED) - BCID2

## 2024-08-13 LAB — GLUCOSE, CAPILLARY
Glucose-Capillary: 208 mg/dL — ABNORMAL HIGH (ref 70–99)
Glucose-Capillary: 199 mg/dL — ABNORMAL HIGH (ref 70–99)
Glucose-Capillary: 170 mg/dL — ABNORMAL HIGH (ref 70–99)

## 2024-08-13 LAB — ECHOCARDIOGRAM COMPLETE
Area-P 1/2: 2.36 cm2
Calc EF: 60.4 %
S' Lateral: 3.9 cm
Single Plane A2C EF: 63.6 %
Single Plane A4C EF: 57.4 %
Weight: 2641.99 [oz_av]

## 2024-08-13 LAB — HIV ANTIBODY (ROUTINE TESTING W REFLEX): HIV Screen 4th Generation wRfx: NONREACTIVE

## 2024-08-13 LAB — STREP PNEUMONIAE URINARY ANTIGEN: Strep Pneumo Urinary Antigen: NEGATIVE

## 2024-08-13 LAB — CBG MONITORING, ED: Glucose-Capillary: 291 mg/dL — ABNORMAL HIGH (ref 70–99)

## 2024-08-13 MED ORDER — DM-GUAIFENESIN ER 30-600 MG PO TB12
1.0000 | ORAL_TABLET | Freq: Two times a day (BID) | ORAL | Status: DC
  Administered 2024-08-13 – 2024-08-17 (×9): 1 via ORAL
  Filled 2024-08-13 (×9): qty 1

## 2024-08-13 MED ORDER — INSULIN ASPART 100 UNIT/ML IJ SOLN
0.0000 [IU] | Freq: Three times a day (TID) | INTRAMUSCULAR | Status: DC
  Administered 2024-08-13: 1 [IU] via SUBCUTANEOUS
  Administered 2024-08-13: 3 [IU] via SUBCUTANEOUS
  Administered 2024-08-14: 1 [IU] via SUBCUTANEOUS
  Administered 2024-08-15 – 2024-08-16 (×2): 2 [IU] via SUBCUTANEOUS
  Filled 2024-08-13: qty 2
  Filled 2024-08-13: qty 1
  Filled 2024-08-13: qty 3
  Filled 2024-08-13: qty 2

## 2024-08-13 MED ORDER — OSELTAMIVIR PHOSPHATE 30 MG PO CAPS
30.0000 mg | ORAL_CAPSULE | Freq: Two times a day (BID) | ORAL | Status: DC
  Administered 2024-08-13 (×2): 30 mg via ORAL
  Filled 2024-08-13 (×3): qty 1

## 2024-08-13 MED ORDER — BENZONATATE 100 MG PO CAPS: 200.0000 mg | ORAL_CAPSULE | Freq: Three times a day (TID) | ORAL | Status: DC

## 2024-08-13 MED ORDER — BENZONATATE 100 MG PO CAPS
200.0000 mg | ORAL_CAPSULE | Freq: Three times a day (TID) | ORAL | Status: DC | PRN
  Administered 2024-08-13 – 2024-08-16 (×2): 200 mg via ORAL
  Filled 2024-08-13 (×2): qty 2

## 2024-08-13 MED ORDER — PREDNISONE 20 MG PO TABS
40.0000 mg | ORAL_TABLET | Freq: Every day | ORAL | Status: AC
  Administered 2024-08-13 – 2024-08-17 (×5): 40 mg via ORAL
  Filled 2024-08-13 (×5): qty 2

## 2024-08-13 NOTE — Progress Notes (Signed)
 " PROGRESS NOTE    [name]  FMW:[id number] DOB: [birth year] DOA: 08/12/2024 PCP: Vernon Velna SAUNDERS, MD  Chief Complaint  Patient presents with   Shortness of Breath   Cough    Hospital Course:  [name] is a 73 year old female with stage Ia lung cancer status post SBRT to 3 pulmonary nodules in 2025 currently being monitored by Dr. Gatha, hypertension, hyperlipidemia, prediabetes.  She presents to the hospital with shortness of breath and cough.  She presented to the ED 4 weeks prior with similar complaints.  She has since completed the antibiotic course but has had persistent cough and shortness of breath.  Acute worsening over the last few days prior to the ED. In the ED she was found be febrile to 101, normotensive, maintaining O2 sats on room air but desaturating into the 80s with ambulation. She was found to have mild leukocytosis with WBC 11.2 and proBNP 2139.  CT chest showed bronchial wall thickening with scattered ground glass and tree-in-bud nodular densities.  She tested positive for influenza A.  Subjective: This morning patient reports persistent coughing and some shortness of breath.  She does report breathing treatments appear to be helping.  She is very hungry and awaiting breakfast   Objective: Vitals:   08/13/24 0900 08/13/24 0915 08/13/24 0930 08/13/24 0945  BP: 110/63   117/62  Pulse: 64 64 62 64  Resp: (!) 22 (!) 23 (!) 23 19  Temp:      TempSrc:      SpO2: 96% 96% 98% 96%  Weight:       No intake or output data in the 24 hours ending 08/13/24 0959 Filed Weights   08/13/24 0200  Weight: 74.9 kg    Examination: General exam: Appears calm and comfortable, NAD  Respiratory system: No work of breathing, symmetric chest wall expansion.  2 L Power in place.  Diffuse wheezing bilaterally Cardiovascular system: S1 & S2 heard, RRR.  Gastrointestinal system: Abdomen is nondistended, soft and nontender.  Neuro: Alert and oriented.  Assessment & Plan:   Principal Problem:   CAP (community acquired pneumonia)    Influenza A - Continue with Tamiflu  - Supportive care  Superimposed bacterial pneumonia with hypoxia - Continue with ceftriaxone  azithromycin  - Supportive care with Mucinex  DM and Tessalon  - Wheezing bilaterally, start prednisone  - Wean oxygen as able  Anxiety - Continue home dose Xanax   Tobacco abuse - Quit 2 weeks ago.  Does not carry diagnosis of COPD though I suspect this may be playing a role.  Prednisone  as above  History of stage Ia lung cancer - Status post SBRT about 10 months ago February/March 2025. - Radiation pneumonitis remains on the differential but given other positive findings we will treat for flu and pneumonia as above.  Hypertension - Resume home meds  Osteoarthritis - Continue close outpatient follow-up  Type 2 diabetes - No recent hemoglobin A1c available for review.  Will obtain during this admission - Sliding scale for now  Body mass index is 27.48 kg/m. - Outpatient follow up for lifestyle modification and risk factor management   DVT prophylaxis: Lovenox    Code Status: Full Code Disposition:  pending clinical resolution. Currently on supplemental O2  Consultants:    Procedures:    Antimicrobials:  Anti-infectives (From admission, onward)    Start     Dose/Rate Route Frequency Ordered Stop   08/13/24 1200  cefTRIAXone  (ROCEPHIN ) 2 g in sodium chloride  0.9 % 100 mL IVPB  2 g 200 mL/hr over 30 Minutes Intravenous Every 24 hours 08/12/24 2005 08/17/24 1159   08/13/24 1000  azithromycin  (ZITHROMAX ) tablet 500 mg        500 mg Oral Daily 08/12/24 2005 08/18/24 0959   08/13/24 1000  oseltamivir  (TAMIFLU ) capsule 30 mg        30 mg Oral 2 times daily 08/13/24 0216 08/17/24 2159   08/12/24 2200  oseltamivir  (TAMIFLU ) capsule 75 mg  Status:  Discontinued        75 mg Oral 2 times daily 08/12/24 2103 08/13/24 0216   08/12/24 1645  cefTRIAXone  (ROCEPHIN ) 1 g in sodium  chloride 0.9 % 100 mL IVPB        1 g 200 mL/hr over 30 Minutes Intravenous  Once 08/12/24 1632 08/12/24 2016   08/12/24 1645  azithromycin  (ZITHROMAX ) 500 mg in sodium chloride  0.9 % 250 mL IVPB        500 mg 250 mL/hr over 60 Minutes Intravenous  Once 08/12/24 1632 08/12/24 2029       Data Reviewed: I have personally reviewed following labs and imaging studies CBC: Recent Labs  Lab 08/12/24 1221  WBC 11.2*  HGB 15.5*  HCT 49.1*  MCV 100.2*  PLT 191   Basic Metabolic Panel: Recent Labs  Lab 08/12/24 1221  NA 135  K 4.1  CL 98  CO2 23  GLUCOSE 98  BUN 18  CREATININE 0.95  CALCIUM  8.6*   GFR: Estimated Creatinine Clearance: 51.9 mL/min (by C-G formula based on SCr of 0.95 mg/dL). Liver Function Tests: Recent Labs  Lab 08/12/24 1221  AST 47*  ALT 24  ALKPHOS 61  BILITOT 0.5  PROT 9.4*  ALBUMIN 3.9   CBG: No results for input(s): GLUCAP in the last 168 hours.  Recent Results (from the past 240 hours)  Blood culture (routine x 2)     Status: None (Preliminary result)   Collection Time: 08/12/24  5:55 PM   Specimen: BLOOD  Result Value Ref Range Status   Specimen Description   Final    BLOOD BLOOD LEFT ARM Performed at [facility], [address] [place], [place] [address]    Special Requests   Final    BOTTLES DRAWN AEROBIC AND ANAEROBIC Blood Culture adequate volume Performed at [facility], [address], [place], [place] [address]    Culture   Final    NO GROWTH < 24 HOURS Performed at [facility], [address] [place], [place] [address]    Report Status PENDING  Incomplete  Resp panel by RT-PCR (RSV, Flu A&B, Covid) Anterior Nasal Swab     Status: Abnormal   Collection Time: 08/12/24  7:03 PM   Specimen: Anterior Nasal Swab  Result Value Ref Range Status   SARS Coronavirus 2 by RT PCR NEGATIVE NEGATIVE Final    Comment: (NOTE) SARS-CoV-2 target nucleic acids are NOT DETECTED.  The  SARS-CoV-2 RNA is generally detectable in upper respiratory specimens during the acute phase of infection. The lowest concentration of SARS-CoV-2 viral copies this assay can detect is 138 copies/mL. A negative result does not preclude SARS-Cov-2 infection and should not be used as the sole basis for treatment or other patient management decisions. A negative result may occur with  improper specimen collection/handling, submission of specimen other than nasopharyngeal swab, presence of viral mutation(s) within the areas targeted by this assay, and inadequate number of viral copies(<138 copies/mL). A negative result must be combined  with clinical observations, patient history, and epidemiological information. The expected result is Negative.  Fact Sheet for Patients:  bloggercourse.com  Fact Sheet for Healthcare Providers:  seriousbroker.it  This test is no t yet approved or cleared by the [place]  FDA and  has been authorized for detection and/or diagnosis of SARS-CoV-2 by FDA under an Emergency Use Authorization (EUA). This EUA will remain  in effect (meaning this test can be used) for the duration of the COVID-19 declaration under Section 564(b)(1) of the Act, 21 U.S.C.section 360bbb-3(b)(1), unless the authorization is terminated  or revoked sooner.       Influenza A by PCR POSITIVE (A) NEGATIVE Final   Influenza B by PCR NEGATIVE NEGATIVE Final    Comment: (NOTE) The Xpert Xpress SARS-CoV-2/FLU/RSV plus assay is intended as an aid in the diagnosis of influenza from Nasopharyngeal swab specimens and should not be used as a sole basis for treatment. Nasal washings and aspirates are unacceptable for Xpert Xpress SARS-CoV-2/FLU/RSV testing.  Fact Sheet for Patients: bloggercourse.com  Fact Sheet for Healthcare Providers: seriousbroker.it  This test is not yet approved or  cleared by the [place]  FDA and has been authorized for detection and/or diagnosis of SARS-CoV-2 by FDA under an Emergency Use Authorization (EUA). This EUA will remain in effect (meaning this test can be used) for the duration of the COVID-19 declaration under Section 564(b)(1) of the Act, 21 U.S.C. section 360bbb-3(b)(1), unless the authorization is terminated or revoked.     Resp Syncytial Virus by PCR NEGATIVE NEGATIVE Final    Comment: (NOTE) Fact Sheet for Patients: bloggercourse.com  Fact Sheet for Healthcare Providers: seriousbroker.it  This test is not yet approved or cleared by the [place]  FDA and has been authorized for detection and/or diagnosis of SARS-CoV-2 by FDA under an Emergency Use Authorization (EUA). This EUA will remain in effect (meaning this test can be used) for the duration of the COVID-19 declaration under Section 564(b)(1) of the Act, 21 U.S.C. section 360bbb-3(b)(1), unless the authorization is terminated or revoked.  Performed at [facility], [address], [place], [place] [address]   Respiratory (~20 pathogens) panel by PCR     Status: Abnormal   Collection Time: 08/12/24  7:03 PM   Specimen: Nasopharyngeal Swab; Respiratory  Result Value Ref Range Status   Adenovirus NOT DETECTED NOT DETECTED Final   Coronavirus 229E NOT DETECTED NOT DETECTED Final    Comment: (NOTE) The Coronavirus on the Respiratory Panel, DOES NOT test for the novel  Coronavirus (2019 nCoV)    Coronavirus HKU1 NOT DETECTED NOT DETECTED Final   Coronavirus NL63 NOT DETECTED NOT DETECTED Final   Coronavirus OC43 NOT DETECTED NOT DETECTED Final   Metapneumovirus NOT DETECTED NOT DETECTED Final   Rhinovirus / Enterovirus NOT DETECTED NOT DETECTED Final   Influenza A H3 DETECTED (A) NOT DETECTED Final   Influenza B NOT DETECTED NOT DETECTED Final   Parainfluenza Virus 1 NOT DETECTED NOT DETECTED  Final   Parainfluenza Virus 2 NOT DETECTED NOT DETECTED Final   Parainfluenza Virus 3 NOT DETECTED NOT DETECTED Final   Parainfluenza Virus 4 NOT DETECTED NOT DETECTED Final   Respiratory Syncytial Virus NOT DETECTED NOT DETECTED Final   Bordetella pertussis NOT DETECTED NOT DETECTED Final   Bordetella Parapertussis NOT DETECTED NOT DETECTED Final   Chlamydophila pneumoniae NOT DETECTED NOT DETECTED Final   Mycoplasma pneumoniae NOT DETECTED NOT DETECTED Final    Comment: Performed at [facility], [address], [place], [place] [address]     Radiology Studies: CT Chest W Contrast Result Date: 08/12/2024 CLINICAL DATA:  Chronic/persisting cough. Dyspnea. Pneumonia, complication suspected. EXAM: CT CHEST WITH CONTRAST TECHNIQUE: Multidetector CT imaging of the chest was performed during intravenous contrast administration. RADIATION DOSE REDUCTION: This exam was performed according to the departmental dose-optimization program which includes automated exposure control, adjustment of the mA and/or kV according to patient size and/or use of iterative reconstruction technique. CONTRAST:  70mL OMNIPAQUE  IOHEXOL  300 MG/ML  SOLN COMPARISON:  07/09/2024. FINDINGS: Cardiovascular: The heart is normal in size and there is no pericardial effusion. Three-vessel coronary artery calcifications are noted. There is atherosclerotic calcification of the aorta without evidence of aneurysm. The pulmonary trunk is normal in caliber. Mediastinum/Nodes: Prominent lymph nodes are present in the mediastinum and hilar regions bilaterally, measuring up to 1.3 cm in the precarinal space. No axillary lymphadenopathy. The thyroid  gland, trachea, and esophagus are within normal limits. Lungs/Pleura: Minimal apical pleural scarring is noted bilaterally. A few scattered scattered tree-in-bud nodular densities are present in the left lower lobe and right upper lobe. Bronchial wall thickening is noted bilaterally. There are few  scattered ground-glass opacities, possibly representing residual infiltrate. No effusion or pneumothorax is seen. There is a stable 9 mm nodule in the right middle lobe, axial image 78. There is an irregular opacity measuring 1.5 cm with surrounding ground-glass and fiducial marker in the right upper lobe, axial image 46. There is an irregular opacity in the right middle lobe measuring 9 mm with adjacent fiducial marker, axial image 95. There is a 10 mm nodule with a fiducial marker in the left upper lobe, axial image 75. There are nodular opacities in the left lower lobe measuring 1.2 cm, axial image 96, 9 mm axial image 95 and 6 mm axial image 97, not well seen on the previous exam due to infiltrates. Upper Abdomen: Fatty infiltration of the liver is noted. Cysts are noted in the left kidney. Surgical clips are present in the left upper quadrant. No acute abnormality. Musculoskeletal: Cervical spinal fusion hardware is noted. There are degenerative changes in the thoracic spine. No acute osseous abnormality. IMPRESSION: 1. Bronchial wall thickening with a few scattered ground-glass and tree-in-bud nodular densities, which may be infectious or inflammatory. 2. Bilateral pulmonary nodules, stable to increased in size from the prior exam. 3. Coronary artery calcifications. 4. Aortic atherosclerosis. 5. Hepatic steatosis. Electronically Signed   By: Leita Birmingham M.D.   On: 08/12/2024 16:06   DG Chest Port 1 View Result Date: 08/12/2024 CLINICAL DATA:  Cough.  Shortness of breath. EXAM: PORTABLE CHEST 1 VIEW COMPARISON:  07/09/2024 FINDINGS: The heart size and mediastinal contours are within normal limits. A few brachytherapy seeds are again seen in both lungs. Mild right upper lobe scarring is stable. The lungs are otherwise clear. Cervical spine fusion hardware again noted. IMPRESSION: No active disease. Electronically Signed   By: Norleen DELENA Kil M.D.   On: 08/12/2024 08:46    Scheduled Meds:  azithromycin    500 mg Oral Daily   enoxaparin  (LOVENOX ) injection  40 mg Subcutaneous Q24H   oseltamivir   30 mg Oral BID   Continuous Infusions:  cefTRIAXone  (ROCEPHIN )  IV       LOS: 0 days  MDM: Patient is high risk for one or more organ failure.  They necessitate ongoing hospitalization for continued IV therapies and subsequent lab monitoring. Total time spent interpreting labs and vitals, reviewing the medical record, coordinating care amongst consultants and  care team members, directly assessing and discussing care with the patient and/or family: 70 min  Donavan Kerlin, DO [facility]  To contact the attending physician between 7A-7P please use Epic Chat. To contact the covering physician during after hours 7P-7A, please review Amion.  08/13/2024, 9:59 AM   *This document has been created with the assistance of dictation software. Please excuse typographical errors. *   "

## 2024-08-13 NOTE — Plan of Care (Signed)
   Problem: Activity: Goal: Risk for activity intolerance will decrease Outcome: Progressing   Problem: Nutrition: Goal: Adequate nutrition will be maintained Outcome: Progressing   Problem: Pain Managment: Goal: General experience of comfort will improve and/or be controlled Outcome: Progressing   Problem: Safety: Goal: Ability to remain free from injury will improve Outcome: Progressing

## 2024-08-13 NOTE — Progress Notes (Signed)
" °  Echocardiogram 2D Echocardiogram has been performed.  Tinnie FORBES Gosling RDCS 08/13/2024, 3:12 PM "

## 2024-08-13 NOTE — Progress Notes (Signed)
" °  Echocardiogram 2D Echocardiogram has been attempted, pt going upstairs  Tinnie FORBES Gosling RDCS 08/13/2024, 1:59 PM "

## 2024-08-13 NOTE — Progress Notes (Signed)
 Flutter valve given to pt in ED.

## 2024-08-13 NOTE — Progress Notes (Signed)
 PHARMACY - PHYSICIAN COMMUNICATION CRITICAL VALUE ALERT - BLOOD CULTURE IDENTIFICATION (BCID)  [name] is an 73 y.o. female who presented to [facility] on 08/12/2024 with a chief complaint of SOB and cough.  She is currently on ceftriaxone  and azithromycin  for suspected PNA.  One of two blood culture collected on 08/12/24 has GPC (BCID = staph epi)   Name of physician (or Provider) Contacted: Dr. Leesa  Current antibiotics: ceftriaxone  and azithromycin    Changes to prescribed antibiotics recommended:  - suspects it's a contaminant - continue azithromycin  and ceftriaxone   Results for orders placed or performed during the hospital encounter of 08/12/24  Blood Culture ID Panel (Reflexed) (Collected: 08/12/2024  5:55 PM)  Result Value Ref Range   Enterococcus faecalis NOT DETECTED NOT DETECTED   Enterococcus Faecium NOT DETECTED NOT DETECTED   Listeria monocytogenes NOT DETECTED NOT DETECTED   Staphylococcus species DETECTED (A) NOT DETECTED   Staphylococcus aureus (BCID) NOT DETECTED NOT DETECTED   Staphylococcus epidermidis DETECTED (A) NOT DETECTED   Staphylococcus lugdunensis NOT DETECTED NOT DETECTED   Streptococcus species NOT DETECTED NOT DETECTED   Streptococcus agalactiae NOT DETECTED NOT DETECTED   Streptococcus pneumoniae NOT DETECTED NOT DETECTED   Streptococcus pyogenes NOT DETECTED NOT DETECTED   A.calcoaceticus-baumannii NOT DETECTED NOT DETECTED   Bacteroides fragilis NOT DETECTED NOT DETECTED   Enterobacterales NOT DETECTED NOT DETECTED   Enterobacter cloacae complex NOT DETECTED NOT DETECTED   Escherichia coli NOT DETECTED NOT DETECTED   Klebsiella aerogenes NOT DETECTED NOT DETECTED   Klebsiella oxytoca NOT DETECTED NOT DETECTED   Klebsiella pneumoniae NOT DETECTED NOT DETECTED   Proteus species NOT DETECTED NOT DETECTED   Salmonella species NOT DETECTED NOT DETECTED   Serratia marcescens NOT DETECTED NOT DETECTED   Haemophilus influenzae NOT DETECTED NOT  DETECTED   Neisseria meningitidis NOT DETECTED NOT DETECTED   Pseudomonas aeruginosa NOT DETECTED NOT DETECTED   Stenotrophomonas maltophilia NOT DETECTED NOT DETECTED   Candida albicans NOT DETECTED NOT DETECTED   Candida auris NOT DETECTED NOT DETECTED   Candida glabrata NOT DETECTED NOT DETECTED   Candida krusei NOT DETECTED NOT DETECTED   Candida parapsilosis NOT DETECTED NOT DETECTED   Candida tropicalis NOT DETECTED NOT DETECTED   Cryptococcus neoformans/gattii NOT DETECTED NOT DETECTED   Methicillin resistance mecA/C NOT DETECTED NOT DETECTED    [name] P 08/13/2024  4:00 PM

## 2024-08-13 NOTE — Plan of Care (Signed)
  Problem: Activity: Goal: Risk for activity intolerance will decrease Outcome: Progressing   Problem: Nutrition: Goal: Adequate nutrition will be maintained Outcome: Progressing   Problem: Coping: Goal: Level of anxiety will decrease Outcome: Progressing   Problem: Pain Managment: Goal: General experience of comfort will improve and/or be controlled Outcome: Progressing   Problem: Safety: Goal: Ability to remain free from injury will improve Outcome: Progressing

## 2024-08-14 DIAGNOSIS — J189 Pneumonia, unspecified organism: Secondary | ICD-10-CM | POA: Diagnosis not present

## 2024-08-14 LAB — COMPREHENSIVE METABOLIC PANEL WITH GFR
ALT: 22 U/L (ref 0–44)
AST: 33 U/L (ref 15–41)
Albumin: 3.4 g/dL — ABNORMAL LOW (ref 3.5–5.0)
Alkaline Phosphatase: 48 U/L (ref 38–126)
Anion gap: 10 (ref 5–15)
BUN: 23 mg/dL (ref 8–23)
CO2: 24 mmol/L (ref 22–32)
Calcium: 8.8 mg/dL — ABNORMAL LOW (ref 8.9–10.3)
Chloride: 102 mmol/L (ref 98–111)
Creatinine, Ser: 0.82 mg/dL (ref 0.44–1.00)
GFR, Estimated: >60 mL/min
Glucose, Bld: 146 mg/dL — ABNORMAL HIGH (ref 70–99)
Potassium: 4.9 mmol/L (ref 3.5–5.1)
Sodium: 136 mmol/L (ref 135–145)
Total Bilirubin: 0.3 mg/dL (ref 0.0–1.2)
Total Protein: 8 g/dL (ref 6.5–8.1)

## 2024-08-14 LAB — CBC
HCT: 41.2 % (ref 36.0–46.0)
Hemoglobin: 13.6 g/dL (ref 12.0–15.0)
MCH: 32.1 pg (ref 26.0–34.0)
MCHC: 33 g/dL (ref 30.0–36.0)
MCV: 97.2 fL (ref 80.0–100.0)
Platelets: 144 K/uL — ABNORMAL LOW (ref 150–400)
RBC: 4.24 MIL/uL (ref 3.87–5.11)
RDW: 13.8 % (ref 11.5–15.5)
WBC: 5.8 K/uL (ref 4.0–10.5)
nRBC: 0 % (ref 0.0–0.2)

## 2024-08-14 LAB — GLUCOSE, CAPILLARY
Glucose-Capillary: 143 mg/dL — ABNORMAL HIGH (ref 70–99)
Glucose-Capillary: 189 mg/dL — ABNORMAL HIGH (ref 70–99)
Glucose-Capillary: 177 mg/dL — ABNORMAL HIGH (ref 70–99)

## 2024-08-14 LAB — PHOSPHORUS: Phosphorus: 2.8 mg/dL (ref 2.5–4.6)

## 2024-08-14 LAB — HEMOGLOBIN A1C
Hgb A1c MFr Bld: 6.1 % — ABNORMAL HIGH (ref 4.8–5.6)
Mean Plasma Glucose: 128.37 mg/dL

## 2024-08-14 LAB — MAGNESIUM: Magnesium: 2.7 mg/dL — ABNORMAL HIGH (ref 1.7–2.4)

## 2024-08-14 LAB — HIV ANTIBODY (ROUTINE TESTING W REFLEX): HIV Screen 4th Generation wRfx: NONREACTIVE

## 2024-08-14 MED ORDER — OSELTAMIVIR PHOSPHATE 75 MG PO CAPS
75.0000 mg | ORAL_CAPSULE | Freq: Two times a day (BID) | ORAL | Status: DC
  Administered 2024-08-14 – 2024-08-17 (×7): 75 mg via ORAL
  Filled 2024-08-14 (×8): qty 1

## 2024-08-14 NOTE — Progress Notes (Signed)
 " PROGRESS NOTE    [name]  FMW:[id number] DOB: August 06, 1951 DOA: 08/12/2024 PCP: Vernon Velna SAUNDERS, MD  Chief Complaint  Patient presents with   Shortness of Breath   Cough    Hospital Course:  [name] is a 73 year old female with stage Ia lung cancer status post SBRT to 3 pulmonary nodules in 2025 currently being monitored by Dr. Gatha, hypertension, hyperlipidemia, prediabetes.  She presents to the hospital with shortness of breath and cough.  She presented to the ED 4 weeks prior with similar complaints.  She has since completed the antibiotic course but has had persistent cough and shortness of breath.  Acute worsening over the last few days prior to the ED. In the ED she was found be febrile to 101, normotensive, maintaining O2 sats on room air but desaturating into the 80s with ambulation. She was found to have mild leukocytosis with WBC 11.2 and proBNP 2139.  CT chest showed bronchial wall thickening with scattered ground glass and tree-in-bud nodular densities.  She tested positive for influenza A.  Subjective: No acute events overnight. On evaluation today patient reports feeling somewhat improved.  She is requesting to discontinue lab draws due to bruising.  Objective: Vitals:   08/14/24 0421 08/14/24 0824 08/14/24 1139 08/14/24 1547  BP: (!) 144/78 (!) 142/80 (!) 145/80 (!) 151/91  Pulse: (!) 59 (!) 59 64 70  Resp: (!) 22 20    Temp: 97.8 F (36.6 C) 98 F (36.7 C) 98 F (36.7 C) 98.1 F (36.7 C)  TempSrc:  Oral Oral Oral  SpO2: 99% 96%  97%  Weight:        Intake/Output Summary (Last 24 hours) at 08/14/2024 1613 Last data filed at 08/14/2024 1240 Gross per 24 hour  Intake 660 ml  Output --  Net 660 ml   Filed Weights   08/13/24 0200  Weight: 74.9 kg    Examination: General exam: Appears calm and comfortable, NAD  Respiratory system: No work of breathing, symmetric chest wall expansion.  2 L [place] in place.  Wheezing is improved.  Some rhonchi  bilaterally.   Cardiovascular system: S1 & S2 heard, RRR.  Gastrointestinal system: Abdomen is nondistended, soft and nontender.  Neuro: Alert and oriented.  Assessment & Plan:  Principal Problem:   CAP (community acquired pneumonia)    Influenza A - Continue with Tamiflu  - Supportive care  Superimposed bacterial pneumonia with hypoxia - Continue with ceftriaxone  azithromycin  - Supportive care with Mucinex  DM and Tessalon  - Wheezing improved.  Complete short course of prednisone  - Continue to wean oxygen as able.  Patient does not use O2 at home.  Will need walk trial prior to DC - PT/OT evaluations  Suspected COPD - Does not officially carry this diagnosis though does have extensive smoking history.  Reports that she quit a few weeks ago - Pulmonology referral at discharge for formal PFTs - On prednisone  currently - Wean oxygen as able  Heart failure with preserved EF - Echocardiogram reveals EF 60 to 65%, no RWMA, mild concentric left ventricular hypertrophy, diastolic parameters with grade 2 diastolic dysfunction. - proBNP 2139 on arrival - Clinically patient is euvolemic.  No indication for Lasix  at this time.  Monitor volume status closely.  Staph epi blood culture positive - 1/2 blood cultures resulted positive for staph epi, believed to be contaminant. - Repeat blood cultures are remain negative  Anxiety - Continue home dose Xanax   Tobacco abuse - Quit 2 weeks ago.  Does  not carry diagnosis of COPD though I suspect this may be playing a role.  Prednisone  as above  History of stage Ia lung cancer - Status post SBRT about 10 months ago February/March 2025. - Radiation pneumonitis remains on the differential but given other positive findings we will treat for flu and pneumonia as above.  Hypertension - Resume home meds  Osteoarthritis - Continue close outpatient follow-up  Type 2 diabetes - Hemoglobin A1c 6.1%, anticipate some worsening hyperglycemia with  steroid use - Low-dose sliding scale added.  Body mass index is 27.48 kg/m. - Outpatient follow up for lifestyle modification and risk factor management   DVT prophylaxis: Lovenox    Code Status: Full Code Disposition:  pending clinical resolution. Currently on supplemental O2  Consultants:    Procedures:    Antimicrobials:  Anti-infectives (From admission, onward)    Start     Dose/Rate Route Frequency Ordered Stop   08/14/24 1000  oseltamivir  (TAMIFLU ) capsule 75 mg        75 mg Oral 2 times daily 08/14/24 0900 08/18/24 0959   08/13/24 1200  cefTRIAXone  (ROCEPHIN ) 2 g in sodium chloride  0.9 % 100 mL IVPB        2 g 200 mL/hr over 30 Minutes Intravenous Every 24 hours 08/12/24 2005 08/17/24 1159   08/13/24 1000  azithromycin  (ZITHROMAX ) tablet 500 mg        500 mg Oral Daily 08/12/24 2005 08/17/24 0959   08/13/24 1000  oseltamivir  (TAMIFLU ) capsule 30 mg  Status:  Discontinued        30 mg Oral 2 times daily 08/13/24 0216 08/14/24 0900   08/12/24 2200  oseltamivir  (TAMIFLU ) capsule 75 mg  Status:  Discontinued        75 mg Oral 2 times daily 08/12/24 2103 08/13/24 0216   08/12/24 1645  cefTRIAXone  (ROCEPHIN ) 1 g in sodium chloride  0.9 % 100 mL IVPB        1 g 200 mL/hr over 30 Minutes Intravenous  Once 08/12/24 1632 08/12/24 2016   08/12/24 1645  azithromycin  (ZITHROMAX ) 500 mg in sodium chloride  0.9 % 250 mL IVPB        500 mg 250 mL/hr over 60 Minutes Intravenous  Once 08/12/24 1632 08/12/24 2029       Data Reviewed: I have personally reviewed following labs and imaging studies CBC: Recent Labs  Lab 08/12/24 1221 08/14/24 0719  WBC 11.2* 5.8  HGB 15.5* 13.6  HCT 49.1* 41.2  MCV 100.2* 97.2  PLT 191 144*   Basic Metabolic Panel: Recent Labs  Lab 08/12/24 1221 08/14/24 0719  NA 135 136  K 4.1 4.9  CL 98 102  CO2 23 24  GLUCOSE 98 146*  BUN 18 23  CREATININE 0.95 0.82  CALCIUM  8.6* 8.8*  MG  --  2.7*  PHOS  --  2.8   GFR: Estimated Creatinine  Clearance: 60.1 mL/min (by C-G formula based on SCr of 0.82 mg/dL). Liver Function Tests: Recent Labs  Lab 08/12/24 1221 08/14/24 0719  AST 47* 33  ALT 24 22  ALKPHOS 61 48  BILITOT 0.5 0.3  PROT 9.4* 8.0  ALBUMIN 3.9 3.4*   CBG: Recent Labs  Lab 08/13/24 1211 08/13/24 1718 08/13/24 1731 08/13/24 2143 08/14/24 0759  GLUCAP 291* 208* 199* 170* 143*    Recent Results (from the past 240 hours)  Blood culture (routine x 2)     Status: Abnormal (Preliminary result)   Collection Time: 08/12/24  5:55 PM   Specimen:  BLOOD LEFT ARM  Result Value Ref Range Status   Specimen Description   Final    BLOOD LEFT ARM Performed at [facility], [address] [place], [place] [address]    Special Requests   Final    BOTTLES DRAWN AEROBIC AND ANAEROBIC Blood Culture adequate volume Performed at [facility], [address] [place], [place] [address]    Culture  Setup Time   Final    GRAM POSITIVE COCCI IN BOTH AEROBIC AND ANAEROBIC BOTTLES CRITICAL RESULT CALLED TO, READ BACK BY AND VERIFIED WITH: PHARMD ASABRA BOLOGNA [id number] @ 1557 FH    Culture (A)  Final    STAPHYLOCOCCUS EPIDERMIDIS THE SIGNIFICANCE OF ISOLATING THIS ORGANISM FROM A SINGLE SET OF BLOOD CULTURES WHEN MULTIPLE SETS ARE DRAWN IS UNCERTAIN. PLEASE NOTIFY THE MICROBIOLOGY DEPARTMENT WITHIN ONE WEEK IF SPECIATION AND SENSITIVITIES ARE REQUIRED. Performed at [facility], [address], [place], [place] [address]    Report Status PENDING  Incomplete  Blood Culture ID Panel (Reflexed)     Status: Abnormal   Collection Time: 08/12/24  5:55 PM  Result Value Ref Range Status   Enterococcus faecalis NOT DETECTED NOT DETECTED Final   Enterococcus Faecium NOT DETECTED NOT DETECTED Final   Listeria monocytogenes NOT DETECTED NOT DETECTED Final   Staphylococcus species DETECTED (A) NOT DETECTED Final    Comment: CRITICAL RESULT CALLED TO, READ BACK BY AND VERIFIED WITH: PHARMD ASABRA BOLOGNA  [id number] @ 1557 FH    Staphylococcus aureus (BCID) NOT DETECTED NOT DETECTED Final   Staphylococcus epidermidis DETECTED (A) NOT DETECTED Final    Comment: CRITICAL RESULT CALLED TO, READ BACK BY AND VERIFIED WITH: PHARMD ASABRA BOLOGNA [id number] @ 1557 FH    Staphylococcus lugdunensis NOT DETECTED NOT DETECTED Final   Streptococcus species NOT DETECTED NOT DETECTED Final   Streptococcus agalactiae NOT DETECTED NOT DETECTED Final   Streptococcus pneumoniae NOT DETECTED NOT DETECTED Final   Streptococcus pyogenes NOT DETECTED NOT DETECTED Final   A.calcoaceticus-baumannii NOT DETECTED NOT DETECTED Final   Bacteroides fragilis NOT DETECTED NOT DETECTED Final   Enterobacterales NOT DETECTED NOT DETECTED Final   Enterobacter cloacae complex NOT DETECTED NOT DETECTED Final   Escherichia coli NOT DETECTED NOT DETECTED Final   Klebsiella aerogenes NOT DETECTED NOT DETECTED Final   Klebsiella oxytoca NOT DETECTED NOT DETECTED Final   Klebsiella pneumoniae NOT DETECTED NOT DETECTED Final   Proteus species NOT DETECTED NOT DETECTED Final   Salmonella species NOT DETECTED NOT DETECTED Final   Serratia marcescens NOT DETECTED NOT DETECTED Final   Haemophilus influenzae NOT DETECTED NOT DETECTED Final   Neisseria meningitidis NOT DETECTED NOT DETECTED Final   Pseudomonas aeruginosa NOT DETECTED NOT DETECTED Final   Stenotrophomonas maltophilia NOT DETECTED NOT DETECTED Final   Candida albicans NOT DETECTED NOT DETECTED Final   Candida auris NOT DETECTED NOT DETECTED Final   Candida glabrata NOT DETECTED NOT DETECTED Final   Candida krusei NOT DETECTED NOT DETECTED Final   Candida parapsilosis NOT DETECTED NOT DETECTED Final   Candida tropicalis NOT DETECTED NOT DETECTED Final   Cryptococcus neoformans/gattii NOT DETECTED NOT DETECTED Final   Methicillin resistance mecA/C NOT DETECTED NOT DETECTED Final    Comment: Performed at [facility], [address], [place], [place] [address]  Resp panel  by RT-PCR (RSV, Flu A&B, Covid) Anterior Nasal Swab     Status: Abnormal   Collection Time: 08/12/24  7:03 PM   Specimen: Anterior  Nasal Swab  Result Value Ref Range Status   SARS Coronavirus 2 by RT PCR NEGATIVE NEGATIVE Final    Comment: (NOTE) SARS-CoV-2 target nucleic acids are NOT DETECTED.  The SARS-CoV-2 RNA is generally detectable in upper respiratory specimens during the acute phase of infection. The lowest concentration of SARS-CoV-2 viral copies this assay can detect is 138 copies/mL. A negative result does not preclude SARS-Cov-2 infection and should not be used as the sole basis for treatment or other patient management decisions. A negative result may occur with  improper specimen collection/handling, submission of specimen other than nasopharyngeal swab, presence of viral mutation(s) within the areas targeted by this assay, and inadequate number of viral copies(<138 copies/mL). A negative result must be combined with clinical observations, patient history, and epidemiological information. The expected result is Negative.  Fact Sheet for Patients:  bloggercourse.com  Fact Sheet for Healthcare Providers:  seriousbroker.it  This test is no t yet approved or cleared by the [place]  FDA and  has been authorized for detection and/or diagnosis of SARS-CoV-2 by FDA under an Emergency Use Authorization (EUA). This EUA will remain  in effect (meaning this test can be used) for the duration of the COVID-19 declaration under Section 564(b)(1) of the Act, 21 U.S.C.section 360bbb-3(b)(1), unless the authorization is terminated  or revoked sooner.       Influenza A by PCR POSITIVE (A) NEGATIVE Final   Influenza B by PCR NEGATIVE NEGATIVE Final    Comment: (NOTE) The Xpert Xpress SARS-CoV-2/FLU/RSV plus assay is intended as an aid in the diagnosis of influenza from Nasopharyngeal swab specimens and should not be used as a  sole basis for treatment. Nasal washings and aspirates are unacceptable for Xpert Xpress SARS-CoV-2/FLU/RSV testing.  Fact Sheet for Patients: bloggercourse.com  Fact Sheet for Healthcare Providers: seriousbroker.it  This test is not yet approved or cleared by the [place]  FDA and has been authorized for detection and/or diagnosis of SARS-CoV-2 by FDA under an Emergency Use Authorization (EUA). This EUA will remain in effect (meaning this test can be used) for the duration of the COVID-19 declaration under Section 564(b)(1) of the Act, 21 U.S.C. section 360bbb-3(b)(1), unless the authorization is terminated or revoked.     Resp Syncytial Virus by PCR NEGATIVE NEGATIVE Final    Comment: (NOTE) Fact Sheet for Patients: bloggercourse.com  Fact Sheet for Healthcare Providers: seriousbroker.it  This test is not yet approved or cleared by the [place]  FDA and has been authorized for detection and/or diagnosis of SARS-CoV-2 by FDA under an Emergency Use Authorization (EUA). This EUA will remain in effect (meaning this test can be used) for the duration of the COVID-19 declaration under Section 564(b)(1) of the Act, 21 U.S.C. section 360bbb-3(b)(1), unless the authorization is terminated or revoked.  Performed at [facility], [address] [place], [place] [address]   Respiratory (~20 pathogens) panel by PCR     Status: Abnormal   Collection Time: 08/12/24  7:03 PM   Specimen: Nasopharyngeal Swab; Respiratory  Result Value Ref Range Status   Adenovirus NOT DETECTED NOT DETECTED Final   Coronavirus 229E NOT DETECTED NOT DETECTED Final    Comment: (NOTE) The Coronavirus on the Respiratory Panel, DOES NOT test for the novel  Coronavirus (2019 nCoV)    Coronavirus HKU1 NOT DETECTED NOT DETECTED Final   Coronavirus NL63 NOT DETECTED NOT DETECTED Final    Coronavirus OC43 NOT DETECTED NOT DETECTED Final   Metapneumovirus NOT DETECTED NOT DETECTED Final  Rhinovirus / Enterovirus NOT DETECTED NOT DETECTED Final   Influenza A H3 DETECTED (A) NOT DETECTED Final   Influenza B NOT DETECTED NOT DETECTED Final   Parainfluenza Virus 1 NOT DETECTED NOT DETECTED Final   Parainfluenza Virus 2 NOT DETECTED NOT DETECTED Final   Parainfluenza Virus 3 NOT DETECTED NOT DETECTED Final   Parainfluenza Virus 4 NOT DETECTED NOT DETECTED Final   Respiratory Syncytial Virus NOT DETECTED NOT DETECTED Final   Bordetella pertussis NOT DETECTED NOT DETECTED Final   Bordetella Parapertussis NOT DETECTED NOT DETECTED Final   Chlamydophila pneumoniae NOT DETECTED NOT DETECTED Final   Mycoplasma pneumoniae NOT DETECTED NOT DETECTED Final    Comment: Performed at [facility], [address], [place], [place] [address]  Blood culture (routine x 2)     Status: None (Preliminary result)   Collection Time: 08/13/24  2:58 PM   Specimen: BLOOD  Result Value Ref Range Status   Specimen Description   Final    BLOOD BLOOD RIGHT HAND Performed at [facility], [address], [place], [place] [address]    Special Requests   Final    BOTTLES DRAWN AEROBIC AND ANAEROBIC Blood Culture adequate volume Performed at [facility], [address], [place], [place] [address]    Culture   Final    NO GROWTH < 12 HOURS Performed at [facility], [address], [place], [place] [address]    Report Status PENDING  Incomplete     Radiology Studies: ECHOCARDIOGRAM COMPLETE Result Date: 08/13/2024    ECHOCARDIOGRAM REPORT   Patient Name:   AMARIE VILES Date of Exam: 08/13/2024 Medical Rec #:  [id number]       Height:       65.0 in Accession #:    [id number]      Weight:       165.1 lb Date of Birth:  12/28/1950       BSA:          1.823 m Patient Age:    73 years        BP:           112/64 mmHg Patient Gender: F               HR:            67 bpm. Exam Location:  Inpatient Procedure: 2D Echo, Cardiac Doppler and Color Doppler (Both Spectral and Color            Flow Doppler were utilized during procedure). Indications:    CHF I50.31  History:        Patient has no prior history of Echocardiogram examinations.                 Risk Factors:Hypertension.  Sonographer:    Tinnie Gosling RDCS Referring Phys: [id number] NILDA HERO GHERGHE IMPRESSIONS  1. Left ventricular ejection fraction, by estimation, is 60 to 65%. The left ventricle has normal function. The left ventricle has no regional wall motion abnormalities. There is mild concentric left ventricular hypertrophy. Left ventricular diastolic parameters are consistent with Grade II diastolic dysfunction (pseudonormalization). Elevated left atrial pressure.  2. Right ventricular systolic function is normal. The right ventricular size is normal. Tricuspid regurgitation signal is inadequate for assessing PA pressure.  3. The mitral valve is normal in structure. Trivial mitral valve regurgitation. No evidence of mitral stenosis.  4. The aortic valve is tricuspid. Aortic valve regurgitation is not  visualized. Aortic valve sclerosis/calcification is present, without any evidence of aortic stenosis.  5. The inferior vena cava is normal in size with greater than 50% respiratory variability, suggesting right atrial pressure of 3 mmHg. FINDINGS  Left Ventricle: Left ventricular ejection fraction, by estimation, is 60 to 65%. The left ventricle has normal function. The left ventricle has no regional wall motion abnormalities. The left ventricular internal cavity size was normal in size. There is  mild concentric left ventricular hypertrophy. Left ventricular diastolic parameters are consistent with Grade II diastolic dysfunction (pseudonormalization). Elevated left atrial pressure. Right Ventricle: The right ventricular size is normal. No increase in right ventricular wall thickness. Right ventricular systolic  function is normal. Tricuspid regurgitation signal is inadequate for assessing PA pressure. Left Atrium: Left atrial size was normal in size. Right Atrium: Right atrial size was normal in size. Pericardium: There is no evidence of pericardial effusion. Mitral Valve: The mitral valve is normal in structure. Mild mitral annular calcification. Trivial mitral valve regurgitation. No evidence of mitral valve stenosis. Tricuspid Valve: The tricuspid valve is normal in structure. Tricuspid valve regurgitation is not demonstrated. No evidence of tricuspid stenosis. Aortic Valve: The aortic valve is tricuspid. Aortic valve regurgitation is not visualized. Aortic valve sclerosis/calcification is present, without any evidence of aortic stenosis. Pulmonic Valve: The pulmonic valve was normal in structure. Pulmonic valve regurgitation is not visualized. No evidence of pulmonic stenosis. Aorta: The aortic root is normal in size and structure. Venous: The inferior vena cava is normal in size with greater than 50% respiratory variability, suggesting right atrial pressure of 3 mmHg. IAS/Shunts: No atrial level shunt detected by color flow Doppler.  LEFT VENTRICLE PLAX 2D LVIDd:         4.90 cm     Diastology LVIDs:         3.90 cm     LV e' medial:    4.24 cm/s LV PW:         1.20 cm     LV E/e' medial:  19.4 LV IVS:        1.20 cm     LV e' lateral:   6.20 cm/s LVOT diam:     1.80 cm     LV E/e' lateral: 13.3 LV SV:         55 LV SV Index:   30 LVOT Area:     2.54 cm  LV Volumes (MOD) LV vol d, MOD A2C: 93.3 ml LV vol d, MOD A4C: 61.7 ml LV vol s, MOD A2C: 34.0 ml LV vol s, MOD A4C: 26.3 ml LV SV MOD A2C:     59.3 ml LV SV MOD A4C:     61.7 ml LV SV MOD BP:      47.4 ml RIGHT VENTRICLE             IVC RV S prime:     13.80 cm/s  IVC diam: 1.60 cm TAPSE (M-mode): 2.0 cm                             PULMONARY VEINS                             Diastolic Velocity: 23.00 cm/s                             S/D Velocity:  2.20                              Systolic Velocity:  49.60 cm/s LEFT ATRIUM             Index        RIGHT ATRIUM           Index LA diam:        3.40 cm 1.86 cm/m   RA Area:     17.00 cm LA Vol (A2C):   66.4 ml 36.42 ml/m  RA Volume:   47.20 ml  25.89 ml/m LA Vol (A4C):   39.7 ml 21.77 ml/m LA Biplane Vol: 51.7 ml 28.35 ml/m  AORTIC VALVE LVOT Vmax:   121.00 cm/s LVOT Vmean:  91.800 cm/s LVOT VTI:    0.218 m  AORTA Ao Root diam: 3.10 cm Ao Asc diam:  3.10 cm MITRAL VALVE MV Area (PHT): 2.36 cm    SHUNTS MV Decel Time: 322 msec    Systemic VTI:  0.22 m MV E velocity: 82.30 cm/s  Systemic Diam: 1.80 cm MV A velocity: 99.00 cm/s MV E/A ratio:  0.83 Morene Brownie Electronically signed by Morene Brownie Signature Date/Time: 08/13/2024/5:27:48 PM    Final     Scheduled Meds:  azithromycin   500 mg Oral Daily   dextromethorphan -guaiFENesin   1 tablet Oral BID   enoxaparin  (LOVENOX ) injection  40 mg Subcutaneous Q24H   insulin  aspart  0-6 Units Subcutaneous TID WC   oseltamivir   75 mg Oral BID   predniSONE   40 mg Oral Q breakfast   Continuous Infusions:  cefTRIAXone  (ROCEPHIN )  IV 2 g (08/14/24 1324)     LOS: 1 day  MDM: Patient is high risk for one or more organ failure.  They necessitate ongoing hospitalization for continued IV therapies and subsequent lab monitoring. Total time spent interpreting labs and vitals, reviewing the medical record, coordinating care amongst consultants and care team members, directly assessing and discussing care with the patient and/or family: 55 min  Jannessa Ogden, DO [facility]  To contact the attending physician between 7A-7P please use Epic Chat. To contact the covering physician during after hours 7P-7A, please review Amion.  08/14/2024, 4:13 PM   *This document has been created with the assistance of dictation software. Please excuse typographical errors. *   "

## 2024-08-14 NOTE — Plan of Care (Signed)
   Problem: Education: Goal: Knowledge of General Education information will improve Description: Including pain rating scale, medication(s)/side effects and non-pharmacologic comfort measures Outcome: Progressing   Problem: Health Behavior/Discharge Planning: Goal: Ability to manage health-related needs will improve Outcome: Progressing   Problem: Clinical Measurements: Goal: Ability to maintain clinical measurements within normal limits will improve Outcome: Progressing Goal: Will remain free from infection Outcome: Progressing Goal: Diagnostic test results will improve Outcome: Progressing Goal: Respiratory complications will improve Outcome: Progressing Goal: Cardiovascular complication will be avoided Outcome: Progressing   Problem: Activity: Goal: Risk for activity intolerance will decrease Outcome: Progressing   Problem: Nutrition: Goal: Adequate nutrition will be maintained Outcome: Progressing   Problem: Coping: Goal: Level of anxiety will decrease Outcome: Progressing   Problem: Elimination: Goal: Will not experience complications related to bowel motility Outcome: Progressing Goal: Will not experience complications related to urinary retention Outcome: Progressing   Problem: Pain Managment: Goal: General experience of comfort will improve and/or be controlled Outcome: Progressing   Problem: Safety: Goal: Ability to remain free from injury will improve Outcome: Progressing   Problem: Skin Integrity: Goal: Risk for impaired skin integrity will decrease Outcome: Progressing   Problem: Education: Goal: Ability to describe self-care measures that may prevent or decrease complications (Diabetes Survival Skills Education) will improve Outcome: Progressing Goal: Individualized Educational Video(s) Outcome: Progressing   Problem: Coping: Goal: Ability to adjust to condition or change in health will improve Outcome: Progressing   Problem: Fluid  Volume: Goal: Ability to maintain a balanced intake and output will improve Outcome: Progressing   Problem: Health Behavior/Discharge Planning: Goal: Ability to identify and utilize available resources and services will improve Outcome: Progressing Goal: Ability to manage health-related needs will improve Outcome: Progressing   Problem: Metabolic: Goal: Ability to maintain appropriate glucose levels will improve Outcome: Progressing   Problem: Nutritional: Goal: Maintenance of adequate nutrition will improve Outcome: Progressing Goal: Progress toward achieving an optimal weight will improve Outcome: Progressing   Problem: Skin Integrity: Goal: Risk for impaired skin integrity will decrease Outcome: Progressing   Problem: Tissue Perfusion: Goal: Adequacy of tissue perfusion will improve Outcome: Progressing   Problem: Activity: Goal: Ability to tolerate increased activity will improve Outcome: Progressing   Problem: Clinical Measurements: Goal: Ability to maintain a body temperature in the normal range will improve Outcome: Progressing   Problem: Respiratory: Goal: Ability to maintain adequate ventilation will improve Outcome: Progressing Goal: Ability to maintain a clear airway will improve Outcome: Progressing

## 2024-08-15 ENCOUNTER — Encounter (HOSPITAL_COMMUNITY): Payer: Self-pay | Admitting: Family Medicine

## 2024-08-15 DIAGNOSIS — J09X1 Influenza due to identified novel influenza A virus with pneumonia: Secondary | ICD-10-CM | POA: Diagnosis not present

## 2024-08-15 DIAGNOSIS — J1100 Influenza due to unidentified influenza virus with unspecified type of pneumonia: Secondary | ICD-10-CM | POA: Diagnosis not present

## 2024-08-15 DIAGNOSIS — J189 Pneumonia, unspecified organism: Secondary | ICD-10-CM | POA: Diagnosis not present

## 2024-08-15 LAB — CULTURE, BLOOD (ROUTINE X 2): Special Requests: ADEQUATE

## 2024-08-15 LAB — GLUCOSE, CAPILLARY
Glucose-Capillary: 123 mg/dL — ABNORMAL HIGH (ref 70–99)
Glucose-Capillary: 124 mg/dL — ABNORMAL HIGH (ref 70–99)
Glucose-Capillary: 211 mg/dL — ABNORMAL HIGH (ref 70–99)
Glucose-Capillary: 194 mg/dL — ABNORMAL HIGH (ref 70–99)

## 2024-08-15 LAB — LEGIONELLA PNEUMOPHILA SEROGP 1 UR AG: L. pneumophila Serogp 1 Ur Ag: NEGATIVE

## 2024-08-15 MED ORDER — IPRATROPIUM-ALBUTEROL 0.5-2.5 (3) MG/3ML IN SOLN
3.0000 mL | Freq: Three times a day (TID) | RESPIRATORY_TRACT | Status: DC
  Administered 2024-08-16 – 2024-08-17 (×4): 3 mL via RESPIRATORY_TRACT
  Filled 2024-08-15 (×5): qty 3

## 2024-08-15 MED ORDER — IPRATROPIUM-ALBUTEROL 0.5-2.5 (3) MG/3ML IN SOLN
3.0000 mL | Freq: Four times a day (QID) | RESPIRATORY_TRACT | Status: DC
  Administered 2024-08-15 (×2): 3 mL via RESPIRATORY_TRACT
  Filled 2024-08-15 (×2): qty 3

## 2024-08-15 NOTE — Progress Notes (Signed)
" °   08/15/24 1342  TOC Brief Assessment  Insurance and Status Reviewed  Patient has primary care physician Yes  Home environment has been reviewed From home alone  Prior level of function: Independent  Prior/Current Home Services No current home services  Social Drivers of Health Review SDOH reviewed no interventions necessary  Readmission risk has been reviewed Yes  Transition of care needs transition of care needs identified, TOC will continue to follow [facility] referral needed. CM has faxed out via hub for choice)    "

## 2024-08-15 NOTE — Evaluation (Signed)
 Physical Therapy Evaluation Patient Details Name: [name] MRN: [id number] DOB: Mar 29, 1951 Today's Date: 08/15/2024  History of Present Illness  73 yo female presents to therapy following hospital admission on 08/12/2024 due to CAP and flu with pt endorsing SOB, cough and general malaise and weakness. Pt PMH includes but is not limited to: arthritis, spinal stenosis s/p ACDF, HLD, HTN, tobacco abuse with recent cessation, anxiety, and lung ca now in remission.  Clinical Impression  Pt admitted with above diagnosis.  Pt currently with functional limitations due to the deficits listed below (see PT Problem List). Pt seated in recliner and reporting being uncomfortable, buttocks. Pt agreeable to therapy intervention. Pt requires cues and set up with S for transfer tasks with use of Rw, gait tasks in hallway with RW on RA with cues for RW management, coordinated breathing and safety for 80 feet, use of RW for safety, stability and energy conservation, sit to supine mod I. Pt left in bed, all needs in place and on 2 L/min supplemental O2 and pt 96%. Please see walking O2 saturation note. Pt will benefit from acute skilled PT to increase their independence and safety with mobility to allow discharge.           If plan is discharge home, recommend the following: A little help with walking and/or transfers;A little help with bathing/dressing/bathroom;Assistance with cooking/housework;Assist for transportation   Can travel by private vehicle        Equipment Recommendations Rolling walker (2 wheels) (TBD as pt progresses)  Recommendations for Other Services       Functional Status Assessment Patient has had a recent decline in their functional status and demonstrates the ability to make significant improvements in function in a reasonable and predictable amount of time.     Precautions / Restrictions Precautions Precautions: Fall (monitor O2 saturation) Restrictions Weight Bearing  Restrictions Per Provider Order: No      Mobility  Bed Mobility Overal bed mobility: Modified Independent             General bed mobility comments: sit to supine    Transfers Overall transfer level: Needs assistance Equipment used: Rolling walker (2 wheels) Transfers: Sit to/from Stand Sit to Stand: Supervision           General transfer comment: min cues for proper UE and AD placement    Ambulation/Gait Ambulation/Gait assistance: Supervision Gait Distance (Feet): 80 Feet Assistive device: Rolling walker (2 wheels) Gait Pattern/deviations: Step-through pattern, Trunk flexed Gait velocity: decreased     General Gait Details: slight trunk flexion with min cues for safety and RW management, as well as cues for pursed lip breathing.  Stairs            Wheelchair Mobility     Tilt Bed    Modified Rankin (Stroke Patients Only)       Balance Overall balance assessment: No apparent balance deficits (not formally assessed) (pt able to maintain static standing no UE support no overt LOB and no fall hx reported)                                           Pertinent Vitals/Pain Pain Assessment Pain Assessment: Faces Faces Pain Scale: Hurts little more Pain Location: buttocks Pain Descriptors / Indicators: Aching, Constant, Discomfort Pain Intervention(s): Monitored during session, Repositioned    Home Living Family/patient expects to be discharged to::  Private residence Living Arrangements: Alone (her son stays with her sometimes) Available Help at Discharge: Family Type of Home: House Home Access: Stairs to enter   Secretary/administrator of Steps: 1   Home Layout: One level Home Equipment: Shower seat;Hand held shower head      Prior Function Prior Level of Function : Independent/Modified Independent;Driving             Mobility Comments: Independent with ambulation. ADLs Comments: Independent with ADLs, cooking,  driving, exercising twice per week and being active in her church. Has occasional hired assist for cleaning.     Extremity/Trunk Assessment   Upper Extremity Assessment Upper Extremity Assessment: LUE deficits/detail;RUE deficits/detail;Right hand dominant RUE Deficits / Details: AROM WFL. Grip strength 4/5. Pronounced upper extremity tremors, which the pt reported to be typical at her baseline. RUE Coordination: decreased fine motor LUE Deficits / Details: AROM WFL. Grip strength 4/5. Pronounced upper extremity tremors, which the pt reported to be typical at her baseline. LUE Coordination: decreased fine motor    Lower Extremity Assessment Lower Extremity Assessment: RLE deficits/detail;LLE deficits/detail;Generalized weakness RLE Deficits / Details: AROM WFL LLE Deficits / Details: AROM WFL    Cervical / Trunk Assessment Cervical / Trunk Assessment: Normal  Communication   Communication Communication: No apparent difficulties    Cognition Arousal: Alert Behavior During Therapy: WFL for tasks assessed/performed   PT - Cognitive impairments: No apparent impairments                         Following commands: Intact       Cueing       General Comments      Exercises     Assessment/Plan    PT Assessment Patient needs continued PT services  PT Problem List Decreased activity tolerance;Decreased balance;Decreased mobility;Decreased coordination;Decreased cognition;Cardiopulmonary status limiting activity       PT Treatment Interventions DME instruction;Gait training;Stair training;Functional mobility training;Therapeutic activities;Therapeutic exercise;Balance training;Neuromuscular re-education;Patient/family education    PT Goals (Current goals can be found in the Care Plan section)  Acute Rehab PT Goals Patient Stated Goal: to be able to get back, I miss myself being active and mobile PT Goal Formulation: With patient Time For Goal Achievement:  08/28/24 Potential to Achieve Goals: Good    Frequency Min 3X/week     Co-evaluation               AM-PAC PT 6 Clicks Mobility  Outcome Measure Help needed turning from your back to your side while in a flat bed without using bedrails?: None Help needed moving from lying on your back to sitting on the side of a flat bed without using bedrails?: None Help needed moving to and from a bed to a chair (including a wheelchair)?: A Little Help needed standing up from a chair using your arms (e.g., wheelchair or bedside chair)?: A Little Help needed to walk in hospital room?: A Little Help needed climbing 3-5 steps with a railing? : Total 6 Click Score: 18    End of Session Equipment Utilized During Treatment: Oxygen;Gait belt Activity Tolerance: Patient limited by fatigue Patient left: in bed;with call bell/phone within reach;with bed alarm set Nurse Communication: Mobility status PT Visit Diagnosis: Unsteadiness on feet (R26.81);Difficulty in walking, not elsewhere classified (R26.2)    Time: 1203-1228 PT Time Calculation (min) (ACUTE ONLY): 25 min   Charges:   PT Evaluation $PT Eval Low Complexity: 1 Low PT Treatments $Gait Training: 8-22 mins PT General  Charges $$ ACUTE PT VISIT: 1 Visit         [place], PT Acute Rehab   [place] VEAR Drone 08/15/2024, 1:29 PM

## 2024-08-15 NOTE — Progress Notes (Signed)
 " PROGRESS NOTE    MERNA BALDI  FMW:[id number] DOB: 04-Sep-1950 DOA: 08/12/2024 PCP: Vernon Velna SAUNDERS, MD    Brief Narrative:  73 year old with stage Ia lung cancer status post SBRT, hypertension, hyperlipidemia and prediabetes presented with about 3 days of shortness of breath and cough.  She was treated for similar symptoms a month ago.  In the emergency room she was febrile with temperature 101, blood pressure is stable.  On room air at rest, however hypoxemic to 80% with ambulation.  proBNP 2139.  CT chest showed bronchial wall thickening with scattered ground glass and tree-in-bud nodular densities.  She tested positive for influenza A.  Remains in the hospital due to significant symptoms.  Subjective: Patient seen and examined.  Afebrile overnight.  On room air at rest.  Episodic cough.  Poor air entry bilaterally.  Poor inspiratory effort.  Not mobilized yet.   Assessment & Plan:   Community acquired pneumonia superimposed on influenza A infection: Hypoxemia. Antibiotics to treat bacterial pneumonia.  Tamiflu  for 5 days.  Patient also on Rocephin  and azithromycin  due to significant symptoms. Prednisone  for 5 days Chest physiotherapy, incentive spirometry, deep breathing exercises, sputum induction, mucolytic's and bronchodilators. Supplemental oxygen to keep saturations more than 90%. Mobilize with PT OT today. Patient apparently has underlying COPD, she does have outpatient pulmonology that she can follow-up with.  Heart failure with preserved EF: Echocardiogram with EF 60 to 65%.  No regional wall motion abnormality.  Grade 2 diastolic dysfunction.  proBNP 2139 on arrival.  She is euvolemic.  Anxiety: On Xanax .  Smoker: Recently quit smoking.  History of stage Ia lung cancer: Treated with radiation.       DVT prophylaxis: enoxaparin  (LOVENOX ) injection 40 mg Start: 08/12/24 2200   Code Status: Full code Family Communication: None at the bedside Disposition Plan:  Status is: Inpatient Remains inpatient appropriate because: Still with significant symptoms     Consultants:  None  Procedures:  None  Antimicrobials:  Rocephin  azithromycin  and Tamiflu  12/29---     Objective: Vitals:   08/14/24 1547 08/14/24 2024 08/15/24 0444 08/15/24 1304  BP: (!) 151/91 (!) 146/78 (!) 157/75 (!) 141/69  Pulse: 70 (!) 59 (!) 53 66  Resp:  18 20 15   Temp: 98.1 F (36.7 C) 97.9 F (36.6 C) 97.9 F (36.6 C) 98.1 F (36.7 C)  TempSrc: Oral Oral Oral   SpO2: 97% 97% 97% 97%  Weight:        Intake/Output Summary (Last 24 hours) at 08/15/2024 1331 Last data filed at 08/15/2024 0900 Gross per 24 hour  Intake 220 ml  Output --  Net 220 ml   Filed Weights   08/13/24 0200  Weight: 74.9 kg    Examination:  General exam: Appears calm and comfortable at rest. Respiratory system: Good bilateral air entry.  Poor inspiratory effort.  Not in any distress. SpO2: 97 % O2 Flow Rate (L/min): 3 L/min  Cardiovascular system: S1 & S2 heard, RRR.  Gastrointestinal system: Abdomen is nondistended, soft and nontender. No organomegaly or masses felt. Normal bowel sounds heard. Central nervous system: Alert and oriented. No focal neurological deficits. Extremities: Symmetric 5 x 5 power.    Data Reviewed: I have personally reviewed following labs and imaging studies  CBC: Recent Labs  Lab 08/12/24 1221 08/14/24 0719  WBC 11.2* 5.8  HGB 15.5* 13.6  HCT 49.1* 41.2  MCV 100.2* 97.2  PLT 191 144*   Basic Metabolic Panel: Recent Labs  Lab 08/12/24 1221 08/14/24  0719  NA 135 136  K 4.1 4.9  CL 98 102  CO2 23 24  GLUCOSE 98 146*  BUN 18 23  CREATININE 0.95 0.82  CALCIUM  8.6* 8.8*  MG  --  2.7*  PHOS  --  2.8   GFR: Estimated Creatinine Clearance: 60.1 mL/min (by C-G formula based on SCr of 0.82 mg/dL). Liver Function Tests: Recent Labs  Lab 08/12/24 1221 08/14/24 0719  AST 47* 33  ALT 24 22  ALKPHOS 61 48  BILITOT 0.5 0.3  PROT 9.4* 8.0   ALBUMIN 3.9 3.4*   No results for input(s): LIPASE, AMYLASE in the last 168 hours. No results for input(s): AMMONIA in the last 168 hours. Coagulation Profile: No results for input(s): INR, PROTIME in the last 168 hours. Cardiac Enzymes: No results for input(s): CKTOTAL, CKMB, CKMBINDEX, TROPONINI in the last 168 hours. BNP (last 3 results) Recent Labs    08/12/24 1221  PROBNP 2,139.0*   HbA1C: Recent Labs    08/13/24 1459  HGBA1C 6.1*   CBG: Recent Labs  Lab 08/14/24 0759 08/14/24 1659 08/14/24 2147 08/15/24 0809 08/15/24 1237  GLUCAP 143* 189* 177* 123* 124*   Lipid Profile: No results for input(s): CHOL, HDL, LDLCALC, TRIG, CHOLHDL, LDLDIRECT in the last 72 hours. Thyroid  Function Tests: No results for input(s): TSH, T4TOTAL, FREET4, T3FREE, THYROIDAB in the last 72 hours. Anemia Panel: No results for input(s): VITAMINB12, FOLATE, FERRITIN, TIBC, IRON, RETICCTPCT in the last 72 hours. Sepsis Labs: No results for input(s): PROCALCITON, LATICACIDVEN in the last 168 hours.  Recent Results (from the past 240 hours)  Blood culture (routine x 2)     Status: Abnormal   Collection Time: 08/12/24  5:55 PM   Specimen: BLOOD LEFT ARM  Result Value Ref Range Status   Specimen Description   Final    BLOOD LEFT ARM Performed at [facility], [address] [place], [place] [address]    Special Requests   Final    BOTTLES DRAWN AEROBIC AND ANAEROBIC Blood Culture adequate volume Performed at [facility], [address], [place], [place] [address]    Culture  Setup Time   Final    GRAM POSITIVE COCCI IN BOTH AEROBIC AND ANAEROBIC BOTTLES CRITICAL RESULT CALLED TO, READ BACK BY AND VERIFIED WITH: PHARMD ASABRA BOLOGNA [id number] @ 1557 FH    Culture (A)  Final    STAPHYLOCOCCUS EPIDERMIDIS THE SIGNIFICANCE OF ISOLATING THIS ORGANISM FROM A SINGLE SET OF BLOOD CULTURES WHEN MULTIPLE SETS ARE  DRAWN IS UNCERTAIN. PLEASE NOTIFY THE MICROBIOLOGY DEPARTMENT WITHIN ONE WEEK IF SPECIATION AND SENSITIVITIES ARE REQUIRED. Performed at [facility], [address], [place], [place] [address]    Report Status 08/15/2024 FINAL  Final  Blood Culture ID Panel (Reflexed)     Status: Abnormal   Collection Time: 08/12/24  5:55 PM  Result Value Ref Range Status   Enterococcus faecalis NOT DETECTED NOT DETECTED Final   Enterococcus Faecium NOT DETECTED NOT DETECTED Final   Listeria monocytogenes NOT DETECTED NOT DETECTED Final   Staphylococcus species DETECTED (A) NOT DETECTED Final    Comment: CRITICAL RESULT CALLED TO, READ BACK BY AND VERIFIED WITH: PHARMD ASABRA BOLOGNA [id number] @ 1557 FH    Staphylococcus aureus (BCID) NOT DETECTED NOT DETECTED Final   Staphylococcus epidermidis DETECTED (A) NOT DETECTED Final    Comment: CRITICAL RESULT CALLED TO, READ BACK BY AND VERIFIED WITH: MAYA RONAL BOLOGNA [id number] @ 1557 FH  Staphylococcus lugdunensis NOT DETECTED NOT DETECTED Final   Streptococcus species NOT DETECTED NOT DETECTED Final   Streptococcus agalactiae NOT DETECTED NOT DETECTED Final   Streptococcus pneumoniae NOT DETECTED NOT DETECTED Final   Streptococcus pyogenes NOT DETECTED NOT DETECTED Final   A.calcoaceticus-baumannii NOT DETECTED NOT DETECTED Final   Bacteroides fragilis NOT DETECTED NOT DETECTED Final   Enterobacterales NOT DETECTED NOT DETECTED Final   Enterobacter cloacae complex NOT DETECTED NOT DETECTED Final   Escherichia coli NOT DETECTED NOT DETECTED Final   Klebsiella aerogenes NOT DETECTED NOT DETECTED Final   Klebsiella oxytoca NOT DETECTED NOT DETECTED Final   Klebsiella pneumoniae NOT DETECTED NOT DETECTED Final   Proteus species NOT DETECTED NOT DETECTED Final   Salmonella species NOT DETECTED NOT DETECTED Final   Serratia marcescens NOT DETECTED NOT DETECTED Final   Haemophilus influenzae NOT DETECTED NOT DETECTED Final   Neisseria meningitidis NOT  DETECTED NOT DETECTED Final   Pseudomonas aeruginosa NOT DETECTED NOT DETECTED Final   Stenotrophomonas maltophilia NOT DETECTED NOT DETECTED Final   Candida albicans NOT DETECTED NOT DETECTED Final   Candida auris NOT DETECTED NOT DETECTED Final   Candida glabrata NOT DETECTED NOT DETECTED Final   Candida krusei NOT DETECTED NOT DETECTED Final   Candida parapsilosis NOT DETECTED NOT DETECTED Final   Candida tropicalis NOT DETECTED NOT DETECTED Final   Cryptococcus neoformans/gattii NOT DETECTED NOT DETECTED Final   Methicillin resistance mecA/C NOT DETECTED NOT DETECTED Final    Comment: Performed at [facility], [address], [place], [place] [address]  Resp panel by RT-PCR (RSV, Flu A&B, Covid) Anterior Nasal Swab     Status: Abnormal   Collection Time: 08/12/24  7:03 PM   Specimen: Anterior Nasal Swab  Result Value Ref Range Status   SARS Coronavirus 2 by RT PCR NEGATIVE NEGATIVE Final    Comment: (NOTE) SARS-CoV-2 target nucleic acids are NOT DETECTED.  The SARS-CoV-2 RNA is generally detectable in upper respiratory specimens during the acute phase of infection. The lowest concentration of SARS-CoV-2 viral copies this assay can detect is 138 copies/mL. A negative result does not preclude SARS-Cov-2 infection and should not be used as the sole basis for treatment or other patient management decisions. A negative result may occur with  improper specimen collection/handling, submission of specimen other than nasopharyngeal swab, presence of viral mutation(s) within the areas targeted by this assay, and inadequate number of viral copies(<138 copies/mL). A negative result must be combined with clinical observations, patient history, and epidemiological information. The expected result is Negative.  Fact Sheet for Patients:  bloggercourse.com  Fact Sheet for Healthcare Providers:  seriousbroker.it  This test is no t yet  approved or cleared by the [place]  FDA and  has been authorized for detection and/or diagnosis of SARS-CoV-2 by FDA under an Emergency Use Authorization (EUA). This EUA will remain  in effect (meaning this test can be used) for the duration of the COVID-19 declaration under Section 564(b)(1) of the Act, 21 U.S.C.section 360bbb-3(b)(1), unless the authorization is terminated  or revoked sooner.       Influenza A by PCR POSITIVE (A) NEGATIVE Final   Influenza B by PCR NEGATIVE NEGATIVE Final    Comment: (NOTE) The Xpert Xpress SARS-CoV-2/FLU/RSV plus assay is intended as an aid in the diagnosis of influenza from Nasopharyngeal swab specimens and should not be used as a sole basis for treatment. Nasal washings and aspirates are unacceptable for Xpert Xpress SARS-CoV-2/FLU/RSV testing.  Fact Sheet for  Patients: bloggercourse.com  Fact Sheet for Healthcare Providers: seriousbroker.it  This test is not yet approved or cleared by the [place]  FDA and has been authorized for detection and/or diagnosis of SARS-CoV-2 by FDA under an Emergency Use Authorization (EUA). This EUA will remain in effect (meaning this test can be used) for the duration of the COVID-19 declaration under Section 564(b)(1) of the Act, 21 U.S.C. section 360bbb-3(b)(1), unless the authorization is terminated or revoked.     Resp Syncytial Virus by PCR NEGATIVE NEGATIVE Final    Comment: (NOTE) Fact Sheet for Patients: bloggercourse.com  Fact Sheet for Healthcare Providers: seriousbroker.it  This test is not yet approved or cleared by the [place]  FDA and has been authorized for detection and/or diagnosis of SARS-CoV-2 by FDA under an Emergency Use Authorization (EUA). This EUA will remain in effect (meaning this test can be used) for the duration of the COVID-19 declaration under Section 564(b)(1)  of the Act, 21 U.S.C. section 360bbb-3(b)(1), unless the authorization is terminated or revoked.  Performed at [facility], [address], [place], [place] [address]   Respiratory (~20 pathogens) panel by PCR     Status: Abnormal   Collection Time: 08/12/24  7:03 PM   Specimen: Nasopharyngeal Swab; Respiratory  Result Value Ref Range Status   Adenovirus NOT DETECTED NOT DETECTED Final   Coronavirus 229E NOT DETECTED NOT DETECTED Final    Comment: (NOTE) The Coronavirus on the Respiratory Panel, DOES NOT test for the novel  Coronavirus (2019 nCoV)    Coronavirus HKU1 NOT DETECTED NOT DETECTED Final   Coronavirus NL63 NOT DETECTED NOT DETECTED Final   Coronavirus OC43 NOT DETECTED NOT DETECTED Final   Metapneumovirus NOT DETECTED NOT DETECTED Final   Rhinovirus / Enterovirus NOT DETECTED NOT DETECTED Final   Influenza A H3 DETECTED (A) NOT DETECTED Final   Influenza B NOT DETECTED NOT DETECTED Final   Parainfluenza Virus 1 NOT DETECTED NOT DETECTED Final   Parainfluenza Virus 2 NOT DETECTED NOT DETECTED Final   Parainfluenza Virus 3 NOT DETECTED NOT DETECTED Final   Parainfluenza Virus 4 NOT DETECTED NOT DETECTED Final   Respiratory Syncytial Virus NOT DETECTED NOT DETECTED Final   Bordetella pertussis NOT DETECTED NOT DETECTED Final   Bordetella Parapertussis NOT DETECTED NOT DETECTED Final   Chlamydophila pneumoniae NOT DETECTED NOT DETECTED Final   Mycoplasma pneumoniae NOT DETECTED NOT DETECTED Final    Comment: Performed at [facility], [address] [place], [place] [address]  Blood culture (routine x 2)     Status: None (Preliminary result)   Collection Time: 08/13/24  2:58 PM   Specimen: BLOOD  Result Value Ref Range Status   Specimen Description   Final    BLOOD BLOOD RIGHT HAND Performed at [facility], [address], [place], [place] [address]    Special Requests   Final    BOTTLES DRAWN AEROBIC AND ANAEROBIC Blood  Culture adequate volume Performed at [facility], [address], [place], [place] [address]    Culture   Final    NO GROWTH 2 DAYS Performed at [facility], [address] [place], [place] [address]    Report Status PENDING  Incomplete         Radiology Studies: ECHOCARDIOGRAM COMPLETE Result Date: 08/13/2024    ECHOCARDIOGRAM REPORT   Patient Name:   MERANDA DECHAINE Date of Exam: 08/13/2024 Medical Rec #:  [id number]  Height:       65.0 in Accession #:    [id number]      Weight:       165.1 lb Date of Birth:  01/21/1951       BSA:          1.823 m Patient Age:    73 years        BP:           112/64 mmHg Patient Gender: F               HR:           67 bpm. Exam Location:  Inpatient Procedure: 2D Echo, Cardiac Doppler and Color Doppler (Both Spectral and Color            Flow Doppler were utilized during procedure). Indications:    CHF I50.31  History:        Patient has no prior history of Echocardiogram examinations.                 Risk Factors:Hypertension.  Sonographer:    Tinnie Gosling RDCS Referring Phys: [phone] NILDA HERO GHERGHE IMPRESSIONS  1. Left ventricular ejection fraction, by estimation, is 60 to 65%. The left ventricle has normal function. The left ventricle has no regional wall motion abnormalities. There is mild concentric left ventricular hypertrophy. Left ventricular diastolic parameters are consistent with Grade II diastolic dysfunction (pseudonormalization). Elevated left atrial pressure.  2. Right ventricular systolic function is normal. The right ventricular size is normal. Tricuspid regurgitation signal is inadequate for assessing PA pressure.  3. The mitral valve is normal in structure. Trivial mitral valve regurgitation. No evidence of mitral stenosis.  4. The aortic valve is tricuspid. Aortic valve regurgitation is not visualized. Aortic valve sclerosis/calcification is present, without any evidence of aortic stenosis.  5. The inferior vena  cava is normal in size with greater than 50% respiratory variability, suggesting right atrial pressure of 3 mmHg. FINDINGS  Left Ventricle: Left ventricular ejection fraction, by estimation, is 60 to 65%. The left ventricle has normal function. The left ventricle has no regional wall motion abnormalities. The left ventricular internal cavity size was normal in size. There is  mild concentric left ventricular hypertrophy. Left ventricular diastolic parameters are consistent with Grade II diastolic dysfunction (pseudonormalization). Elevated left atrial pressure. Right Ventricle: The right ventricular size is normal. No increase in right ventricular wall thickness. Right ventricular systolic function is normal. Tricuspid regurgitation signal is inadequate for assessing PA pressure. Left Atrium: Left atrial size was normal in size. Right Atrium: Right atrial size was normal in size. Pericardium: There is no evidence of pericardial effusion. Mitral Valve: The mitral valve is normal in structure. Mild mitral annular calcification. Trivial mitral valve regurgitation. No evidence of mitral valve stenosis. Tricuspid Valve: The tricuspid valve is normal in structure. Tricuspid valve regurgitation is not demonstrated. No evidence of tricuspid stenosis. Aortic Valve: The aortic valve is tricuspid. Aortic valve regurgitation is not visualized. Aortic valve sclerosis/calcification is present, without any evidence of aortic stenosis. Pulmonic Valve: The pulmonic valve was normal in structure. Pulmonic valve regurgitation is not visualized. No evidence of pulmonic stenosis. Aorta: The aortic root is normal in size and structure. Venous: The inferior vena cava is normal in size with greater than 50% respiratory variability, suggesting right atrial pressure of 3 mmHg. IAS/Shunts: No atrial level shunt detected by color flow Doppler.  LEFT VENTRICLE PLAX 2D LVIDd:         4.90  cm     Diastology LVIDs:         3.90 cm     LV e'  medial:    4.24 cm/s LV PW:         1.20 cm     LV E/e' medial:  19.4 LV IVS:        1.20 cm     LV e' lateral:   6.20 cm/s LVOT diam:     1.80 cm     LV E/e' lateral: 13.3 LV SV:         55 LV SV Index:   30 LVOT Area:     2.54 cm  LV Volumes (MOD) LV vol d, MOD A2C: 93.3 ml LV vol d, MOD A4C: 61.7 ml LV vol s, MOD A2C: 34.0 ml LV vol s, MOD A4C: 26.3 ml LV SV MOD A2C:     59.3 ml LV SV MOD A4C:     61.7 ml LV SV MOD BP:      47.4 ml RIGHT VENTRICLE             IVC RV S prime:     13.80 cm/s  IVC diam: 1.60 cm TAPSE (M-mode): 2.0 cm                             PULMONARY VEINS                             Diastolic Velocity: 23.00 cm/s                             S/D Velocity:       2.20                             Systolic Velocity:  49.60 cm/s LEFT ATRIUM             Index        RIGHT ATRIUM           Index LA diam:        3.40 cm 1.86 cm/m   RA Area:     17.00 cm LA Vol (A2C):   66.4 ml 36.42 ml/m  RA Volume:   47.20 ml  25.89 ml/m LA Vol (A4C):   39.7 ml 21.77 ml/m LA Biplane Vol: 51.7 ml 28.35 ml/m  AORTIC VALVE LVOT Vmax:   121.00 cm/s LVOT Vmean:  91.800 cm/s LVOT VTI:    0.218 m  AORTA Ao Root diam: 3.10 cm Ao Asc diam:  3.10 cm MITRAL VALVE MV Area (PHT): 2.36 cm    SHUNTS MV Decel Time: 322 msec    Systemic VTI:  0.22 m MV E velocity: 82.30 cm/s  Systemic Diam: 1.80 cm MV A velocity: 99.00 cm/s MV E/A ratio:  0.83 Morene Brownie Electronically signed by Morene Brownie Signature Date/Time: 08/13/2024/5:27:48 PM    Final         Scheduled Meds:  azithromycin   500 mg Oral Daily   dextromethorphan -guaiFENesin   1 tablet Oral BID   enoxaparin  (LOVENOX ) injection  40 mg Subcutaneous Q24H   insulin  aspart  0-6 Units Subcutaneous TID WC   ipratropium-albuterol   3 mL Nebulization Q6H   oseltamivir   75 mg Oral BID   predniSONE   40 mg Oral Q breakfast  Continuous Infusions:  cefTRIAXone  (ROCEPHIN )  IV 2 g (08/15/24 1304)     LOS: 2 days      Renato Applebaum, MD [facility]   "

## 2024-08-15 NOTE — TOC Initial Note (Signed)
 Transition of Care [facility]) - Initial/Assessment Note    Patient Details  Name: [name] MRN: [id number] Date of Birth: [birth year]  Transition of Care [facility]) CM/SW Contact:    Toy LITTIE Agar, RN Phone Number:[phone]  08/15/2024, 1:58 PM  Clinical Narrative:                 CM following patient with [facility] recommendation . Referral sent via hub for choice. Patient has no preference. Home health referral has been accepted by [facility] with [facility]. AVS has been updated .     Barriers to Discharge: No Barriers Identified, Continued Medical Work up   Patient Goals and CMS Choice            Expected Discharge Plan and Services                                              Prior Living Arrangements/Services                       Activities of Daily Living   ADL Screening (condition at time of admission) Independently performs ADLs?: Yes (appropriate for developmental age) Is the patient deaf or have difficulty hearing?: No Does the patient have difficulty seeing, even when wearing glasses/contacts?: No Does the patient have difficulty concentrating, remembering, or making decisions?: No  Permission Sought/Granted                  Emotional Assessment              Admission diagnosis:  Bronchitis [J40] CAP (community acquired pneumonia) [J18.9] Community acquired pneumonia, unspecified laterality [J18.9] Patient Active Problem List   Diagnosis Date Noted   CAP (community acquired pneumonia) 08/12/2024   Non-small cell carcinoma of lung, left (HCC) 09/26/2023   Non-small cell carcinoma of lung, right (HCC) 09/26/2023   Lung cancer (HCC) 09/21/2023   Pulmonary nodules 09/05/2023   Hair loss 04/16/2021   Mold exposure 04/16/2021   H/O: GI bleed 04/16/2021   Allergic rhinitis due to allergen 04/16/2021   Rash 04/16/2021   Vitamin D  deficiency 01/02/2016   Arthropathy 01/02/2016   Tremor 01/02/2016   Primary generalized (osteo)arthritis  01/02/2016   Cigarette nicotine dependence, uncomplicated 01/02/2016   Female climacteric state 01/02/2016   Hyperglycemia 01/02/2016   Essential (primary) hypertension 01/02/2016   Elevated white blood cell count 01/02/2016   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 01/02/2016   Cervicalgia 01/02/2016   Radiculopathy of cervical region 01/02/2016   Essential tremor 01/02/2016   Anxiety disorder 01/02/2016   Pure hypercholesterolemia 01/02/2016   Weakness 01/02/2016   Fatigue 01/02/2016   HLD (hyperlipidemia) 01/02/2016   Body mass index (BMI) of 35.0-35.9 in adult 08/28/2014   PCP:  Vernon Velna SAUNDERS, MD Pharmacy:   [facility] [id number], [place] - [address] AT [address] [place] [address] Phone: [phone] Fax: [phone]     Social Drivers of Health (SDOH) Social History: SDOH Screenings   Food Insecurity: No Food Insecurity (08/14/2024)  Housing: Low Risk (08/14/2024)  Transportation Needs: No Transportation Needs (08/14/2024)  Utilities: Not At Risk (08/14/2024)  Alcohol  Screen: Low Risk (09/26/2023)  Depression (PHQ2-9): Low Risk (09/26/2023)  Tobacco Use: High Risk (07/09/2024)   SDOH Interventions:     Readmission Risk Interventions     No data to  display

## 2024-08-15 NOTE — Progress Notes (Signed)
 Occupational Therapy Evaluation Patient Details Name: [name] MRN: [id number] DOB: [birth year] Today's Date: 08/15/2024   History of Present Illness   73 yr old female who presented to the hospital on 08/12/2024 with SOB, cough and general malaise and weakness. She was found to have bacterial PNA and influenza A. PMH includes but is not limited to: arthritis, spinal stenosis s/p ACDF , HLD, HTN,  anxiety, stage 1 lung CA      Clinical Impressions The pt is currently presenting with the below listed deficits (see OT problem list). As such, her ADL performance is compromised and she requires assist for self-care management. During the session today, she required SBA to CGA for tasks, including, supine to sit, upper and lower body dressing, and ambulation into the hall using a RW. Her O2 saturation was noted to be 94-95% on 3L O2 with activity. She will benefit from OT services to maximize her independence with self care tasks and to decrease the risk for further deconditioning. Home health OT is recommended.      If plan is discharge home, recommend the following:   Help with stairs or ramp for entrance;Assistance with cooking/housework;Assist for transportation     Functional Status Assessment   Patient has had a recent decline in their functional status and demonstrates the ability to make significant improvements in function in a reasonable and predictable amount of time.     Equipment Recommendations   None recommended by OT     Recommendations for Other Services         Precautions/Restrictions         Mobility Bed Mobility Overal bed mobility: Needs Assistance Bed Mobility: Supine to Sit     Supine to sit: Supervision          Transfers Overall transfer level: Needs assistance Equipment used: Rolling walker (2 wheels) Transfers: Sit to/from Stand Sit to Stand: Contact guard assist           Balance     Sitting balance-Leahy Scale:  Good       Standing balance-Leahy Scale: Fair           ADL either performed or assessed with clinical judgement   ADL Overall ADL's : Needs assistance/impaired Eating/Feeding: Set up;Sitting Eating/Feeding Details (indicate cue type and reason): intermittent assist needed to cut food, remove lids and open packets, given pt't baseline BUE tremors Grooming: Set up;Supervision/safety;Sitting Grooming Details (indicate cue type and reason): Pt performed teeth brushing and face washing seated in the chair. She required slightly increased time, given tremors. Occasional spillage noted. Upper Body Bathing: Set up;Sitting   Lower Body Bathing: Minimal assistance;Sit to/from stand   Upper Body Dressing : Set up;Sitting Upper Body Dressing Details (indicate cue type and reason): assist needed to donn a hospital gown around her back seated EOB Lower Body Dressing: Contact guard assist;Sit to/from stand Lower Body Dressing Details (indicate cue type and reason): She donned her socks seated EOB. Toilet Transfer: Contact guard assist;Ambulation;Rolling walker (2 wheels);Grab bars   Toileting- Clothing Manipulation and Hygiene: Contact guard assist;Sit to/from stand Toileting - Clothing Manipulation Details (indicate cue type and reason): at bathroom level, based on clinical judgement             Vision   Additional Comments: She correctly read the time depicted on the wall clock.            Pertinent Vitals/Pain Pain Assessment Pain Assessment: No/denies pain     Extremity/Trunk Assessment Upper Extremity  Assessment Upper Extremity Assessment: LUE deficits/detail;RUE deficits/detail;Right hand dominant RUE Deficits / Details: AROM WFL. Grip strength 4/5. Pronounced upper extremity tremors, which the pt reported to be typical at her baseline. RUE Coordination: decreased fine motor LUE Deficits / Details: AROM WFL. Grip strength 4/5. Pronounced upper extremity tremors, which the  pt reported to be typical at her baseline. LUE Coordination: decreased fine motor   Lower Extremity Assessment Lower Extremity Assessment: RLE deficits/detail;LLE deficits/detail;Generalized weakness RLE Deficits / Details: AROM WFL LLE Deficits / Details: AROM WFL      Communication Communication Communication: No apparent difficulties   Cognition Arousal: Alert Behavior During Therapy: WFL for tasks assessed/performed Cognition: No apparent impairments             OT - Cognition Comments: Oriented x4            Following commands: Intact       Cueing  General Comments   Cueing Techniques: Verbal cues              Home Living Family/patient expects to be discharged to:: Private residence Living Arrangements: Alone (her son stays with her sometimes) Available Help at Discharge: Family Type of Home: House Home Access: Stairs to enter Secretary/administrator of Steps: 1   Home Layout: One level     Bathroom Shower/Tub: Tub/shower unit         Home Equipment: Shower seat;Hand held shower head          Prior Functioning/Environment Prior Level of Function : Independent/Modified Independent;Driving             Mobility Comments: Independent with ambulation. ADLs Comments: Independent with ADLs, cooking, driving, exercising twice per week and being active in her church. Has occasional hired assist for cleaning.    OT Problem List: Decreased strength   OT Treatment/Interventions: Self-care/ADL training;Therapeutic exercise;Energy conservation;DME and/or AE instruction;Balance training;Patient/family education      OT Goals(Current goals can be found in the care plan section)   Acute Rehab OT Goals OT Goal Formulation: With patient Time For Goal Achievement: 08/29/24 Potential to Achieve Goals: Good ADL Goals Pt Will Perform Grooming: with modified independence;standing Pt Will Perform Lower Body Dressing: with modified independence;sit  to/from stand Pt Will Transfer to Toilet: with modified independence;ambulating Pt Will Perform Toileting - Clothing Manipulation and hygiene: with modified independence;sit to/from stand   OT Frequency:  Min 1X/week       AM-PAC OT 6 Clicks Daily Activity     Outcome Measure Help from another person eating meals?: None Help from another person taking care of personal grooming?: A Little Help from another person toileting, which includes using toliet, bedpan, or urinal?: A Little Help from another person bathing (including washing, rinsing, drying)?: A Little Help from another person to put on and taking off regular upper body clothing?: A Little Help from another person to put on and taking off regular lower body clothing?: A Little 6 Click Score: 19   End of Session Equipment Utilized During Treatment: Gait belt;Oxygen;Rolling walker (2 wheels) Nurse Communication: Mobility status  Activity Tolerance: Patient tolerated treatment well Patient left: in chair;with call bell/phone within reach  OT Visit Diagnosis: Muscle weakness (generalized) (M62.81)                Time: 8895-8870 OT Time Calculation (min): 25 min Charges:  OT General Charges $OT Visit: 1 Visit OT Evaluation $OT Eval Low Complexity: 1 Low OT Treatments $Self Care/Home Management : 8-22 mins  Delanna JINNY Lesches, OTR/L 08/15/2024, 1:33 PM

## 2024-08-15 NOTE — Care Management Important Message (Signed)
 Important Message  Patient Details IM Letter given. Name: [name] MRN: [id number] Date of Birth: Feb 19, 1951   Important Message Given:  Yes - Medicare IM     Melba Ates 08/15/2024, 10:23 AM

## 2024-08-15 NOTE — Progress Notes (Signed)
 SATURATION QUALIFICATIONS: (This note is used to comply with regulatory documentation for home oxygen)  Patient Saturations on Room Air at Rest = 93%  Patient Saturations on Room Air while Ambulating = 89%  Patient Saturations on 2 Liters of oxygen while Ambulating = 94%  [place], PT Acute Rehab

## 2024-08-16 DIAGNOSIS — J09X1 Influenza due to identified novel influenza A virus with pneumonia: Secondary | ICD-10-CM | POA: Diagnosis not present

## 2024-08-16 DIAGNOSIS — J189 Pneumonia, unspecified organism: Secondary | ICD-10-CM | POA: Diagnosis not present

## 2024-08-16 LAB — GLUCOSE, CAPILLARY
Glucose-Capillary: 89 mg/dL (ref 70–99)
Glucose-Capillary: 146 mg/dL — ABNORMAL HIGH (ref 70–99)
Glucose-Capillary: 240 mg/dL — ABNORMAL HIGH (ref 70–99)
Glucose-Capillary: 131 mg/dL — ABNORMAL HIGH (ref 70–99)

## 2024-08-16 MED ORDER — POLYETHYLENE GLYCOL 3350 17 G PO PACK
17.0000 g | PACK | Freq: Every day | ORAL | Status: DC
  Administered 2024-08-16: 17 g via ORAL
  Filled 2024-08-16 (×2): qty 1

## 2024-08-16 NOTE — Plan of Care (Signed)
" °  Problem: Education: Goal: Knowledge of General Education information will improve Description: Including pain rating scale, medication(s)/side effects and non-pharmacologic comfort measures Outcome: Progressing   Problem: Clinical Measurements: Goal: Will remain free from infection Outcome: Progressing Goal: Diagnostic test results will improve Outcome: Progressing Goal: Respiratory complications will improve Outcome: Progressing Goal: Cardiovascular complication will be avoided Outcome: Progressing   Problem: Activity: Goal: Risk for activity intolerance will decrease Outcome: Progressing   Problem: Nutrition: Goal: Adequate nutrition will be maintained Outcome: Progressing   Problem: Coping: Goal: Level of anxiety will decrease Outcome: Progressing   Problem: Safety: Goal: Ability to remain free from injury will improve Outcome: Progressing   "

## 2024-08-16 NOTE — Progress Notes (Signed)
 " PROGRESS NOTE    [name]  FMW:[id number] DOB: 10/09/1950 DOA: 08/12/2024 PCP: Vernon Velna SAUNDERS, MD    Brief Narrative:  74 year old with stage Ia lung cancer status post SBRT, hypertension, hyperlipidemia and prediabetes presented with about 3 days of shortness of breath and cough.  She was treated for similar symptoms a month ago.  In the emergency room she was febrile with temperature 101, blood pressure is stable.  On room air at rest, however hypoxemic to 80% with ambulation.  proBNP 2139.  CT chest showed bronchial wall thickening with scattered ground glass and tree-in-bud nodular densities.  She tested positive for influenza A.  Remains in the hospital due to significant symptoms.  Subjective:  Patient was seen and examined.  No overnight events.  Still feels very weak occasional wheezing and cough.  Was on room air at rest.  Later on on 3 L oxygen.   Assessment & Plan:   Community acquired pneumonia superimposed on influenza A infection: Hypoxemia. Tamiflu  for 5 days.  Patient also on Rocephin  and azithromycin  due to significant symptoms. Prednisone  for 5 days Chest physiotherapy, incentive spirometry, deep breathing exercises, sputum induction, mucolytic's and bronchodilators. Supplemental oxygen to keep saturations more than 90%.  Today on room air. Mobilize with PT OT . Patient apparently has underlying COPD, she does have outpatient pulmonology that she can follow-up with.  Heart failure with preserved EF: Echocardiogram with EF 60 to 65%.  No regional wall motion abnormality.  Grade 2 diastolic dysfunction.  proBNP 2139 on arrival.  She is euvolemic.  Anxiety: On Xanax .  Smoker: Recently quit smoking.  History of stage Ia lung cancer: Treated with radiation.       DVT prophylaxis: enoxaparin  (LOVENOX ) injection 40 mg Start: 08/12/24 2200   Code Status: Full code Family Communication: None at the bedside Disposition Plan: Status is: Inpatient Remains  inpatient appropriate because: Still with significant symptoms     Consultants:  None  Procedures:  None  Antimicrobials:  Rocephin  azithromycin  and Tamiflu  12/29---     Objective: Vitals:   08/16/24 0842 08/16/24 0847 08/16/24 1217 08/16/24 1416  BP:   (!) 154/82   Pulse: 62  63   Resp:   17   Temp:   97.8 F (36.6 C)   TempSrc:   Oral   SpO2: 95% 95% 96% 96%  Weight:        Intake/Output Summary (Last 24 hours) at 08/16/2024 1434 Last data filed at 08/16/2024 0829 Gross per 24 hour  Intake 360 ml  Output --  Net 360 ml   Filed Weights   08/13/24 0200  Weight: 74.9 kg    Examination:  General exam: Comfortable at rest.  Slightly anxious.  Pleasant to conversation. Respiratory system: Good bilateral air entry.  Poor inspiratory effort.  No added sounds. SpO2: 96 % O2 Flow Rate (L/min): 3 L/min  Cardiovascular system: S1 & S2 heard, RRR.  Gastrointestinal system: Abdomen is nondistended, soft and nontender. No organomegaly or masses felt. Normal bowel sounds heard. Central nervous system: Alert and oriented. No focal neurological deficits. Extremities: Symmetric 5 x 5 power.    Data Reviewed: I have personally reviewed following labs and imaging studies  CBC: Recent Labs  Lab 08/12/24 1221 08/14/24 0719  WBC 11.2* 5.8  HGB 15.5* 13.6  HCT 49.1* 41.2  MCV 100.2* 97.2  PLT 191 144*   Basic Metabolic Panel: Recent Labs  Lab 08/12/24 1221 08/14/24 0719  NA 135 136  K 4.1  4.9  CL 98 102  CO2 23 24  GLUCOSE 98 146*  BUN 18 23  CREATININE 0.95 0.82  CALCIUM  8.6* 8.8*  MG  --  2.7*  PHOS  --  2.8   GFR: Estimated Creatinine Clearance: 60.1 mL/min (by C-G formula based on SCr of 0.82 mg/dL). Liver Function Tests: Recent Labs  Lab 08/12/24 1221 08/14/24 0719  AST 47* 33  ALT 24 22  ALKPHOS 61 48  BILITOT 0.5 0.3  PROT 9.4* 8.0  ALBUMIN 3.9 3.4*   No results for input(s): LIPASE, AMYLASE in the last 168 hours. No results for  input(s): AMMONIA in the last 168 hours. Coagulation Profile: No results for input(s): INR, PROTIME in the last 168 hours. Cardiac Enzymes: No results for input(s): CKTOTAL, CKMB, CKMBINDEX, TROPONINI in the last 168 hours. BNP (last 3 results) Recent Labs    08/12/24 1221  PROBNP 2,139.0*   HbA1C: Recent Labs    08/13/24 1459  HGBA1C 6.1*   CBG: Recent Labs  Lab 08/15/24 1237 08/15/24 1657 08/15/24 2046 08/16/24 0749 08/16/24 1143  GLUCAP 124* 211* 194* 89 146*   Lipid Profile: No results for input(s): CHOL, HDL, LDLCALC, TRIG, CHOLHDL, LDLDIRECT in the last 72 hours. Thyroid  Function Tests: No results for input(s): TSH, T4TOTAL, FREET4, T3FREE, THYROIDAB in the last 72 hours. Anemia Panel: No results for input(s): VITAMINB12, FOLATE, FERRITIN, TIBC, IRON, RETICCTPCT in the last 72 hours. Sepsis Labs: No results for input(s): PROCALCITON, LATICACIDVEN in the last 168 hours.  Recent Results (from the past 240 hours)  Blood culture (routine x 2)     Status: Abnormal   Collection Time: 08/12/24  5:55 PM   Specimen: BLOOD LEFT ARM  Result Value Ref Range Status   Specimen Description   Final    BLOOD LEFT ARM Performed at [facility], [address] [place], [place] [address]    Special Requests   Final    BOTTLES DRAWN AEROBIC AND ANAEROBIC Blood Culture adequate volume Performed at [facility], [address], [place], [place] [address]    Culture  Setup Time   Final    GRAM POSITIVE COCCI IN BOTH AEROBIC AND ANAEROBIC BOTTLES CRITICAL RESULT CALLED TO, READ BACK BY AND VERIFIED WITH: PHARMD ASABRA BOLOGNA [id number] @ 1557 FH    Culture (A)  Final    STAPHYLOCOCCUS EPIDERMIDIS THE SIGNIFICANCE OF ISOLATING THIS ORGANISM FROM A SINGLE SET OF BLOOD CULTURES WHEN MULTIPLE SETS ARE DRAWN IS UNCERTAIN. PLEASE NOTIFY THE MICROBIOLOGY DEPARTMENT WITHIN ONE WEEK IF SPECIATION AND SENSITIVITIES  ARE REQUIRED. Performed at [facility], [address] [place], [place] [address]    Report Status 08/15/2024 FINAL  Final  Blood Culture ID Panel (Reflexed)     Status: Abnormal   Collection Time: 08/12/24  5:55 PM  Result Value Ref Range Status   Enterococcus faecalis NOT DETECTED NOT DETECTED Final   Enterococcus Faecium NOT DETECTED NOT DETECTED Final   Listeria monocytogenes NOT DETECTED NOT DETECTED Final   Staphylococcus species DETECTED (A) NOT DETECTED Final    Comment: CRITICAL RESULT CALLED TO, READ BACK BY AND VERIFIED WITH: PHARMD ASABRA BOLOGNA [id number] @ 1557 FH    Staphylococcus aureus (BCID) NOT DETECTED NOT DETECTED Final   Staphylococcus epidermidis DETECTED (A) NOT DETECTED Final    Comment: CRITICAL RESULT CALLED TO, READ BACK BY AND VERIFIED WITH: PHARMD ASABRA BOLOGNA [id number] @ 1557 FH    Staphylococcus lugdunensis NOT DETECTED NOT DETECTED Final  Streptococcus species NOT DETECTED NOT DETECTED Final   Streptococcus agalactiae NOT DETECTED NOT DETECTED Final   Streptococcus pneumoniae NOT DETECTED NOT DETECTED Final   Streptococcus pyogenes NOT DETECTED NOT DETECTED Final   A.calcoaceticus-baumannii NOT DETECTED NOT DETECTED Final   Bacteroides fragilis NOT DETECTED NOT DETECTED Final   Enterobacterales NOT DETECTED NOT DETECTED Final   Enterobacter cloacae complex NOT DETECTED NOT DETECTED Final   Escherichia coli NOT DETECTED NOT DETECTED Final   Klebsiella aerogenes NOT DETECTED NOT DETECTED Final   Klebsiella oxytoca NOT DETECTED NOT DETECTED Final   Klebsiella pneumoniae NOT DETECTED NOT DETECTED Final   Proteus species NOT DETECTED NOT DETECTED Final   Salmonella species NOT DETECTED NOT DETECTED Final   Serratia marcescens NOT DETECTED NOT DETECTED Final   Haemophilus influenzae NOT DETECTED NOT DETECTED Final   Neisseria meningitidis NOT DETECTED NOT DETECTED Final   Pseudomonas aeruginosa NOT DETECTED NOT DETECTED Final   Stenotrophomonas  maltophilia NOT DETECTED NOT DETECTED Final   Candida albicans NOT DETECTED NOT DETECTED Final   Candida auris NOT DETECTED NOT DETECTED Final   Candida glabrata NOT DETECTED NOT DETECTED Final   Candida krusei NOT DETECTED NOT DETECTED Final   Candida parapsilosis NOT DETECTED NOT DETECTED Final   Candida tropicalis NOT DETECTED NOT DETECTED Final   Cryptococcus neoformans/gattii NOT DETECTED NOT DETECTED Final   Methicillin resistance mecA/C NOT DETECTED NOT DETECTED Final    Comment: Performed at [facility], [address], [place], [place] [address]  Resp panel by RT-PCR (RSV, Flu A&B, Covid) Anterior Nasal Swab     Status: Abnormal   Collection Time: 08/12/24  7:03 PM   Specimen: Anterior Nasal Swab  Result Value Ref Range Status   SARS Coronavirus 2 by RT PCR NEGATIVE NEGATIVE Final    Comment: (NOTE) SARS-CoV-2 target nucleic acids are NOT DETECTED.  The SARS-CoV-2 RNA is generally detectable in upper respiratory specimens during the acute phase of infection. The lowest concentration of SARS-CoV-2 viral copies this assay can detect is 138 copies/mL. A negative result does not preclude SARS-Cov-2 infection and should not be used as the sole basis for treatment or other patient management decisions. A negative result may occur with  improper specimen collection/handling, submission of specimen other than nasopharyngeal swab, presence of viral mutation(s) within the areas targeted by this assay, and inadequate number of viral copies(<138 copies/mL). A negative result must be combined with clinical observations, patient history, and epidemiological information. The expected result is Negative.  Fact Sheet for Patients:  bloggercourse.com  Fact Sheet for Healthcare Providers:  seriousbroker.it  This test is no t yet approved or cleared by the [place]  FDA and  has been authorized for detection and/or diagnosis of  SARS-CoV-2 by FDA under an Emergency Use Authorization (EUA). This EUA will remain  in effect (meaning this test can be used) for the duration of the COVID-19 declaration under Section 564(b)(1) of the Act, 21 U.S.C.section 360bbb-3(b)(1), unless the authorization is terminated  or revoked sooner.       Influenza A by PCR POSITIVE (A) NEGATIVE Final   Influenza B by PCR NEGATIVE NEGATIVE Final    Comment: (NOTE) The Xpert Xpress SARS-CoV-2/FLU/RSV plus assay is intended as an aid in the diagnosis of influenza from Nasopharyngeal swab specimens and should not be used as a sole basis for treatment. Nasal washings and aspirates are unacceptable for Xpert Xpress SARS-CoV-2/FLU/RSV testing.  Fact Sheet for Patients: bloggercourse.com  Fact Sheet for Healthcare Providers: seriousbroker.it  This test is not yet approved or cleared by the [place]  FDA and has been authorized for detection and/or diagnosis of SARS-CoV-2 by FDA under an Emergency Use Authorization (EUA). This EUA will remain in effect (meaning this test can be used) for the duration of the COVID-19 declaration under Section 564(b)(1) of the Act, 21 U.S.C. section 360bbb-3(b)(1), unless the authorization is terminated or revoked.     Resp Syncytial Virus by PCR NEGATIVE NEGATIVE Final    Comment: (NOTE) Fact Sheet for Patients: bloggercourse.com  Fact Sheet for Healthcare Providers: seriousbroker.it  This test is not yet approved or cleared by the [place]  FDA and has been authorized for detection and/or diagnosis of SARS-CoV-2 by FDA under an Emergency Use Authorization (EUA). This EUA will remain in effect (meaning this test can be used) for the duration of the COVID-19 declaration under Section 564(b)(1) of the Act, 21 U.S.C. section 360bbb-3(b)(1), unless the authorization is terminated  or revoked.  Performed at [facility], [address], [place], [place] [address]   Respiratory (~20 pathogens) panel by PCR     Status: Abnormal   Collection Time: 08/12/24  7:03 PM   Specimen: Nasopharyngeal Swab; Respiratory  Result Value Ref Range Status   Adenovirus NOT DETECTED NOT DETECTED Final   Coronavirus 229E NOT DETECTED NOT DETECTED Final    Comment: (NOTE) The Coronavirus on the Respiratory Panel, DOES NOT test for the novel  Coronavirus (2019 nCoV)    Coronavirus HKU1 NOT DETECTED NOT DETECTED Final   Coronavirus NL63 NOT DETECTED NOT DETECTED Final   Coronavirus OC43 NOT DETECTED NOT DETECTED Final   Metapneumovirus NOT DETECTED NOT DETECTED Final   Rhinovirus / Enterovirus NOT DETECTED NOT DETECTED Final   Influenza A H3 DETECTED (A) NOT DETECTED Final   Influenza B NOT DETECTED NOT DETECTED Final   Parainfluenza Virus 1 NOT DETECTED NOT DETECTED Final   Parainfluenza Virus 2 NOT DETECTED NOT DETECTED Final   Parainfluenza Virus 3 NOT DETECTED NOT DETECTED Final   Parainfluenza Virus 4 NOT DETECTED NOT DETECTED Final   Respiratory Syncytial Virus NOT DETECTED NOT DETECTED Final   Bordetella pertussis NOT DETECTED NOT DETECTED Final   Bordetella Parapertussis NOT DETECTED NOT DETECTED Final   Chlamydophila pneumoniae NOT DETECTED NOT DETECTED Final   Mycoplasma pneumoniae NOT DETECTED NOT DETECTED Final    Comment: Performed at [facility], [address], [place], [place] [address]  Blood culture (routine x 2)     Status: None (Preliminary result)   Collection Time: 08/13/24  2:58 PM   Specimen: BLOOD  Result Value Ref Range Status   Specimen Description   Final    BLOOD BLOOD RIGHT HAND Performed at [facility], [address] [place], [place] [address]    Special Requests   Final    BOTTLES DRAWN AEROBIC AND ANAEROBIC Blood Culture adequate volume Performed at [facility], [address], [place], [place] [address]    Culture   Final    NO GROWTH 3 DAYS Performed at [facility], [address] [place], [place] [address]    Report Status PENDING  Incomplete         Radiology Studies: No results found.       Scheduled Meds:  dextromethorphan -guaiFENesin   1 tablet Oral BID   enoxaparin  (LOVENOX ) injection  40 mg Subcutaneous Q24H   insulin  aspart  0-6 Units Subcutaneous TID WC   ipratropium-albuterol   3  mL Nebulization TID   oseltamivir   75 mg Oral BID   polyethylene glycol  17 g Oral Daily   predniSONE   40 mg Oral Q breakfast   Continuous Infusions:     LOS: 3 days      Renato Applebaum, MD [facility]   "

## 2024-08-16 NOTE — Plan of Care (Signed)

## 2024-08-17 ENCOUNTER — Other Ambulatory Visit (HOSPITAL_COMMUNITY): Payer: Self-pay

## 2024-08-17 DIAGNOSIS — J09X1 Influenza due to identified novel influenza A virus with pneumonia: Secondary | ICD-10-CM | POA: Diagnosis not present

## 2024-08-17 LAB — GLUCOSE, CAPILLARY
Glucose-Capillary: 90 mg/dL (ref 70–99)
Glucose-Capillary: 125 mg/dL — ABNORMAL HIGH (ref 70–99)

## 2024-08-17 MED ORDER — ALBUTEROL SULFATE HFA 108 (90 BASE) MCG/ACT IN AERS
1.0000 | INHALATION_SPRAY | RESPIRATORY_TRACT | 2 refills | Status: AC | PRN
  Filled 2024-08-17: qty 6.7, 30d supply, fill #0

## 2024-08-17 MED ORDER — DM-GUAIFENESIN ER 30-600 MG PO TB12
1.0000 | ORAL_TABLET | Freq: Two times a day (BID) | ORAL | 0 refills | Status: AC
  Filled 2024-08-17: qty 20, 10d supply, fill #0

## 2024-08-17 MED ORDER — PREDNISONE 20 MG PO TABS
20.0000 mg | ORAL_TABLET | Freq: Every day | ORAL | 0 refills | Status: AC
  Filled 2024-08-17: qty 3, 3d supply, fill #0

## 2024-08-17 MED ORDER — BENZONATATE 100 MG PO CAPS
200.0000 mg | ORAL_CAPSULE | Freq: Three times a day (TID) | ORAL | 0 refills | Status: AC | PRN
  Filled 2024-08-17: qty 20, 4d supply, fill #0

## 2024-08-17 MED ORDER — POLYETHYLENE GLYCOL 3350 17 GM/SCOOP PO POWD
17.0000 g | Freq: Every day | ORAL | 0 refills | Status: AC
  Filled 2024-08-17: qty 238, 14d supply, fill #0

## 2024-08-17 MED ORDER — OSELTAMIVIR PHOSPHATE 75 MG PO CAPS
75.0000 mg | ORAL_CAPSULE | Freq: Two times a day (BID) | ORAL | 0 refills | Status: AC
  Filled 2024-08-17: qty 2, 1d supply, fill #0

## 2024-08-17 NOTE — Progress Notes (Signed)
 Physical Therapy Treatment Patient Details Name: [name] MRN: [id number] DOB: 1950-09-20 Today's Date: 08/17/2024   History of Present Illness 74 yo female presents to therapy following hospital admission on 08/12/2024 due to CAP and flu with pt endorsing SOB, cough and general malaise and weakness. Pt PMH includes but is not limited to: arthritis, spinal stenosis s/p ACDF, , HLD, HTN, tobacco abuse with recent cessation, anxiety, and lung ca now in remission.    PT Comments  Pt resting in bed, agreeable to session. On RA with sats at 95%, stands with RW and VC for proper hand placement. Improved ambulation distance completed today, continues to requires cues for PLB. O2 after walk at 92%. Educated pt on obtaining a pulse ox for home to continue to monitor her O2 levels following discharge. Reviewed DME needs and proper use, energy conservation strategies, breathing exercises and what to expect with HHPT. Pt prepared for discharge.     If plan is discharge home, recommend the following: A little help with walking and/or transfers;A little help with bathing/dressing/bathroom;Assistance with cooking/housework;Assist for transportation   Can travel by private vehicle        Equipment Recommendations  Rolling walker (2 wheels)    Recommendations for Other Services       Precautions / Restrictions Precautions Precautions: Fall;Other (comment) (monitor O2 sats) Recall of Precautions/Restrictions: Intact Restrictions Weight Bearing Restrictions Per Provider Order: No     Mobility  Bed Mobility Overal bed mobility: Modified Independent             General bed mobility comments: sit<>supine with HOB elevated, no use of bedrails, increased time and effort    Transfers Overall transfer level: Needs assistance Equipment used: Rolling walker (2 wheels) Transfers: Sit to/from Stand Sit to Stand: Supervision           General transfer comment: cues for proper hand  placement    Ambulation/Gait Ambulation/Gait assistance: Supervision Gait Distance (Feet): 100 Feet Assistive device: Rolling walker (2 wheels)   Gait velocity: decreased     General Gait Details: gait mechanics appear to be closer to baseline, intermittent cues for PLB to help maintain O2 sats >90% on RA with mobility   Stairs             Wheelchair Mobility     Tilt Bed    Modified Rankin (Stroke Patients Only)       Balance Overall balance assessment: No apparent balance deficits (not formally assessed)   Sitting balance-Leahy Scale: Good       Standing balance-Leahy Scale: Fair                              Hotel Manager: No apparent difficulties  Cognition Arousal: Alert Behavior During Therapy: WFL for tasks assessed/performed   PT - Cognitive impairments: No apparent impairments                         Following commands: Intact      Cueing Cueing Techniques: Verbal cues  Exercises      General Comments        Pertinent Vitals/Pain Pain Assessment Pain Assessment: No/denies pain    Home Living                          Prior Function  PT Goals (current goals can now be found in the care plan section) Acute Rehab PT Goals Patient Stated Goal: to be able to get back, I miss myself being active and mobile PT Goal Formulation: With patient Time For Goal Achievement: 08/28/24 Potential to Achieve Goals: Good Progress towards PT goals: Progressing toward goals    Frequency    Min 3X/week      PT Plan      Co-evaluation              AM-PAC PT 6 Clicks Mobility   Outcome Measure  Help needed turning from your back to your side while in a flat bed without using bedrails?: None Help needed moving from lying on your back to sitting on the side of a flat bed without using bedrails?: None Help needed moving to and from a bed to a chair (including a  wheelchair)?: A Little Help needed standing up from a chair using your arms (e.g., wheelchair or bedside chair)?: None Help needed to walk in hospital room?: A Little Help needed climbing 3-5 steps with a railing? : A Lot 6 Click Score: 20    End of Session Equipment Utilized During Treatment: Gait belt Activity Tolerance: Patient tolerated treatment well Patient left: in bed;with call bell/phone within reach Nurse Communication: Mobility status PT Visit Diagnosis: Unsteadiness on feet (R26.81);Difficulty in walking, not elsewhere classified (R26.2)     Time: 8951-8888 PT Time Calculation (min) (ACUTE ONLY): 23 min  Charges:    $Gait Training: 8-22 mins $Therapeutic Activity: 8-22 mins                       Isaiah DEL. Zonie Crutcher, PT, DPT   [employer] 08/17/2024, 12:02 PM

## 2024-08-17 NOTE — TOC Transition Note (Signed)
 Transition of Care [facility]) - Discharge Note   Patient Details  Name: [name] MRN: [id number] Date of Birth: [birth year]  [facility] CM/SW Contact:  Sonda Manuella Quill, RN Phone Number: 08/17/2024, 12:33 PM   Clinical Narrative:    D/C and RW orders received; spoke w/ pt in room; pt agreed to receive recc DME; she does not have agency preference; referral given to Jermaine at [facility]; he said DME will be delivered to pt's room before d/c; pt notified; HHPT/OT previously arranged w/ [facility]; Cory given notification of d/c; contact info for agencies placed in follow up provider section of d/c instructions; no IP CM needs.     Barriers to Discharge: No Barriers Identified, Continued Medical Work up   Patient Goals and CMS Choice            Discharge Placement                       Discharge Plan and Services Additional resources added to the After Visit Summary for                                       Social Drivers of Health (SDOH) Interventions SDOH Screenings   Food Insecurity: No Food Insecurity (08/14/2024)  Housing: Low Risk (08/14/2024)  Transportation Needs: No Transportation Needs (08/14/2024)  Utilities: Not At Risk (08/14/2024)  Alcohol  Screen: Low Risk (09/26/2023)  Depression (PHQ2-9): Low Risk (09/26/2023)  Tobacco Use: High Risk (07/09/2024)     Readmission Risk Interventions     No data to display

## 2024-08-17 NOTE — Plan of Care (Signed)
   Problem: Education: Goal: Knowledge of General Education information will improve Description: Including pain rating scale, medication(s)/side effects and non-pharmacologic comfort measures Outcome: Progressing   Problem: Health Behavior/Discharge Planning: Goal: Ability to manage health-related needs will improve Outcome: Progressing   Problem: Clinical Measurements: Goal: Ability to maintain clinical measurements within normal limits will improve Outcome: Progressing Goal: Will remain free from infection Outcome: Progressing Goal: Diagnostic test results will improve Outcome: Progressing Goal: Respiratory complications will improve Outcome: Progressing Goal: Cardiovascular complication will be avoided Outcome: Progressing   Problem: Activity: Goal: Risk for activity intolerance will decrease Outcome: Progressing   Problem: Nutrition: Goal: Adequate nutrition will be maintained Outcome: Progressing   Problem: Coping: Goal: Level of anxiety will decrease Outcome: Progressing   Problem: Elimination: Goal: Will not experience complications related to bowel motility Outcome: Progressing Goal: Will not experience complications related to urinary retention Outcome: Progressing   Problem: Pain Managment: Goal: General experience of comfort will improve and/or be controlled Outcome: Progressing   Problem: Safety: Goal: Ability to remain free from injury will improve Outcome: Progressing   Problem: Skin Integrity: Goal: Risk for impaired skin integrity will decrease Outcome: Progressing   Problem: Education: Goal: Ability to describe self-care measures that may prevent or decrease complications (Diabetes Survival Skills Education) will improve Outcome: Progressing Goal: Individualized Educational Video(s) Outcome: Progressing   Problem: Coping: Goal: Ability to adjust to condition or change in health will improve Outcome: Progressing   Problem: Fluid  Volume: Goal: Ability to maintain a balanced intake and output will improve Outcome: Progressing   Problem: Health Behavior/Discharge Planning: Goal: Ability to identify and utilize available resources and services will improve Outcome: Progressing Goal: Ability to manage health-related needs will improve Outcome: Progressing   Problem: Metabolic: Goal: Ability to maintain appropriate glucose levels will improve Outcome: Progressing   Problem: Nutritional: Goal: Maintenance of adequate nutrition will improve Outcome: Progressing Goal: Progress toward achieving an optimal weight will improve Outcome: Progressing   Problem: Skin Integrity: Goal: Risk for impaired skin integrity will decrease Outcome: Progressing   Problem: Tissue Perfusion: Goal: Adequacy of tissue perfusion will improve Outcome: Progressing   Problem: Activity: Goal: Ability to tolerate increased activity will improve Outcome: Progressing   Problem: Clinical Measurements: Goal: Ability to maintain a body temperature in the normal range will improve Outcome: Progressing   Problem: Respiratory: Goal: Ability to maintain adequate ventilation will improve Outcome: Progressing Goal: Ability to maintain a clear airway will improve Outcome: Progressing

## 2024-08-17 NOTE — Discharge Summary (Signed)
 Physician Discharge Summary  [name] FMW:[id number] DOB: 24-Jun-1951 DOA: 08/12/2024  PCP: Vernon Velna SAUNDERS, MD  Admit date: 08/12/2024 Discharge date: 08/17/2024  Admitted From: Home Disposition: Home with home health  Recommendations for Outpatient Follow-up:  Follow up with PCP in 1-2 weeks Please obtain BMP/CBC in one week   Home Health: PT/OT Equipment/Devices: None  Discharge Condition: Stable CODE STATUS: Full code Diet recommendation: Regular diet  Discharge summary: 74 year old with stage Ia lung cancer status post SBRT, hypertension, hyperlipidemia and prediabetes presented with about 3 days of shortness of breath and cough.  She was treated for similar symptoms a month ago.  In the emergency room she was febrile with temperature 101, blood pressure is stable.  On room air at rest, however hypoxemic to 80% with ambulation.  proBNP 2139.  CT chest showed bronchial wall thickening with scattered ground glass and tree-in-bud nodular densities.  She tested positive for influenza A.  Patient was treated with broad-spectrum antibiotics, Tamiflu , steroids and aggressive bronchodilator therapy and symptomatic management with clinical improvement.  Mostly improved.  She has some dry cough remaining however symptoms improved and on room air now with mobility.  Stable to discharge home with close outpatient follow-up.    Community acquired pneumonia superimposed on influenza A infection: Hypoxemia. Patient treated with Tamiflu , Rocephin  azithromycin  for 5 days and mucolytic's along with bronchodilator therapy.  Clinically improved.  On room air now. Going home, complete 5 days of Tamiflu . Albuterol  inhaler prescribed. Antitussives and mucolytic's prescribed. She has a pulmonary as outpatient for follow-up.  May need to continue follow-up due to underlying lung cancer and radiation.   Heart failure with preserved EF: Echocardiogram with EF 60 to 65%.  No regional wall motion  abnormality.  Grade 2 diastolic dysfunction.  proBNP 2139 on arrival.  She is euvolemic.   Anxiety: On Xanax .   Smoker: Recently quit smoking.   History of stage Ia lung cancer: Treated with radiation.   Adequately stabilized to go home with close outpatient follow-up.   Discharge Diagnoses:  Principal Problem:   CAP (community acquired pneumonia)    Discharge Instructions  Discharge Instructions     Diet general   Complete by: As directed    Increase activity slowly   Complete by: As directed       Allergies as of 08/17/2024       Reactions   Lyrica [pregabalin] Other (See Comments)   Made symptoms worse   Simvastatin  Other (See Comments)   Myalgias   Statins Other (See Comments)   Myalgias   Meloxicam Other (See Comments)   Makes her sweat and muscle cramps         Medication List     STOP taking these medications    azithromycin  250 MG tablet Commonly known as: ZITHROMAX    methocarbamol  500 MG tablet Commonly known as: ROBAXIN    MULTI-VITAMIN/IRON PO   VITAMIN C  PO       TAKE these medications    acetaminophen  500 MG tablet Commonly known as: TYLENOL  Take 500 mg by mouth every 6 (six) hours as needed for mild pain, moderate pain, fever or headache.   albuterol  108 (90 Base) MCG/ACT inhaler Commonly known as: VENTOLIN  HFA Inhale 1 puff into the lungs every 4 (four) hours as needed for wheezing or shortness of breath.   ALPRAZolam  0.5 MG tablet Commonly known as: XANAX  Take 0.25 tablets by mouth every 6 (six) hours as needed for anxiety or sleep. anxiety   benzonatate  100  MG capsule Commonly known as: TESSALON  Take 2 capsules (200 mg total) by mouth 3 (three) times daily as needed. What changed: how much to take   Boostrix 5-2.5-18.5 LF-MCG/0.5 injection Generic drug: Tdap   dextromethorphan -guaiFENesin  30-600 MG 12hr tablet Commonly known as: MUCINEX  DM Take 1 tablet by mouth 2 (two) times daily for 7 days.   oseltamivir  75 MG  capsule Commonly known as: TAMIFLU  Take 1 capsule (75 mg total) by mouth 2 (two) times daily for 2 doses.   polyethylene glycol 17 g packet Commonly known as: MIRALAX / GLYCOLAX Take 17 g by mouth daily.   predniSONE  20 MG tablet Commonly known as: DELTASONE  Take 1 tablet (20 mg total) by mouth daily with breakfast for 3 days.   Slow Fe 142 (45 Fe) MG Tbcr Generic drug: Ferrous Sulfate  Take 1 tablet by mouth 3 (three) times a week.   Vitamin D  50 MCG (2000 UT) tablet Take 2,000 Units by mouth daily.        Contact information for after-discharge care     Home Medical Care     [facility]) .   Service: Home Health Services Contact information: [address] [place]  [address] [phone]                    Allergies[1]  Consultations: None   Procedures/Studies: ECHOCARDIOGRAM COMPLETE Result Date: 08/13/2024    ECHOCARDIOGRAM REPORT   Patient Name:   [name] Date of Exam: 08/13/2024 Medical Rec #:  [id number]       Height:       65.0 in Accession #:    [id number]      Weight:       165.1 lb Date of Birth:  [birth year]       BSA:          1.823 m Patient Age:    74 years        BP:           112/64 mmHg Patient Gender: F               HR:           67 bpm. Exam Location:  Inpatient Procedure: 2D Echo, Cardiac Doppler and Color Doppler (Both Spectral and Color            Flow Doppler were utilized during procedure). Indications:    CHF I50.31  History:        Patient has no prior history of Echocardiogram examinations.                 Risk Factors:Hypertension.  Sonographer:    Tinnie Gosling RDCS Referring Phys: [phone] NILDA HERO GHERGHE IMPRESSIONS  1. Left ventricular ejection fraction, by estimation, is 60 to 65%. The left ventricle has normal function. The left ventricle has no regional wall motion abnormalities. There is mild concentric left ventricular hypertrophy. Left ventricular diastolic parameters are  consistent with Grade II diastolic dysfunction (pseudonormalization). Elevated left atrial pressure.  2. Right ventricular systolic function is normal. The right ventricular size is normal. Tricuspid regurgitation signal is inadequate for assessing PA pressure.  3. The mitral valve is normal in structure. Trivial mitral valve regurgitation. No evidence of mitral stenosis.  4. The aortic valve is tricuspid. Aortic valve regurgitation is not visualized. Aortic valve sclerosis/calcification is present, without any evidence of aortic stenosis.  5. The inferior vena cava is normal in size with greater  than 50% respiratory variability, suggesting right atrial pressure of 3 mmHg. FINDINGS  Left Ventricle: Left ventricular ejection fraction, by estimation, is 60 to 65%. The left ventricle has normal function. The left ventricle has no regional wall motion abnormalities. The left ventricular internal cavity size was normal in size. There is  mild concentric left ventricular hypertrophy. Left ventricular diastolic parameters are consistent with Grade II diastolic dysfunction (pseudonormalization). Elevated left atrial pressure. Right Ventricle: The right ventricular size is normal. No increase in right ventricular wall thickness. Right ventricular systolic function is normal. Tricuspid regurgitation signal is inadequate for assessing PA pressure. Left Atrium: Left atrial size was normal in size. Right Atrium: Right atrial size was normal in size. Pericardium: There is no evidence of pericardial effusion. Mitral Valve: The mitral valve is normal in structure. Mild mitral annular calcification. Trivial mitral valve regurgitation. No evidence of mitral valve stenosis. Tricuspid Valve: The tricuspid valve is normal in structure. Tricuspid valve regurgitation is not demonstrated. No evidence of tricuspid stenosis. Aortic Valve: The aortic valve is tricuspid. Aortic valve regurgitation is not visualized. Aortic valve  sclerosis/calcification is present, without any evidence of aortic stenosis. Pulmonic Valve: The pulmonic valve was normal in structure. Pulmonic valve regurgitation is not visualized. No evidence of pulmonic stenosis. Aorta: The aortic root is normal in size and structure. Venous: The inferior vena cava is normal in size with greater than 50% respiratory variability, suggesting right atrial pressure of 3 mmHg. IAS/Shunts: No atrial level shunt detected by color flow Doppler.  LEFT VENTRICLE PLAX 2D LVIDd:         4.90 cm     Diastology LVIDs:         3.90 cm     LV e' medial:    4.24 cm/s LV PW:         1.20 cm     LV E/e' medial:  19.4 LV IVS:        1.20 cm     LV e' lateral:   6.20 cm/s LVOT diam:     1.80 cm     LV E/e' lateral: 13.3 LV SV:         55 LV SV Index:   30 LVOT Area:     2.54 cm  LV Volumes (MOD) LV vol d, MOD A2C: 93.3 ml LV vol d, MOD A4C: 61.7 ml LV vol s, MOD A2C: 34.0 ml LV vol s, MOD A4C: 26.3 ml LV SV MOD A2C:     59.3 ml LV SV MOD A4C:     61.7 ml LV SV MOD BP:      47.4 ml RIGHT VENTRICLE             IVC RV S prime:     13.80 cm/s  IVC diam: 1.60 cm TAPSE (M-mode): 2.0 cm                             PULMONARY VEINS                             Diastolic Velocity: 23.00 cm/s                             S/D Velocity:       2.20  Systolic Velocity:  49.60 cm/s LEFT ATRIUM             Index        RIGHT ATRIUM           Index LA diam:        3.40 cm 1.86 cm/m   RA Area:     17.00 cm LA Vol (A2C):   66.4 ml 36.42 ml/m  RA Volume:   47.20 ml  25.89 ml/m LA Vol (A4C):   39.7 ml 21.77 ml/m LA Biplane Vol: 51.7 ml 28.35 ml/m  AORTIC VALVE LVOT Vmax:   121.00 cm/s LVOT Vmean:  91.800 cm/s LVOT VTI:    0.218 m  AORTA Ao Root diam: 3.10 cm Ao Asc diam:  3.10 cm MITRAL VALVE MV Area (PHT): 2.36 cm    SHUNTS MV Decel Time: 322 msec    Systemic VTI:  0.22 m MV E velocity: 82.30 cm/s  Systemic Diam: 1.80 cm MV A velocity: 99.00 cm/s MV E/A ratio:  0.83 Morene Brownie  Electronically signed by Morene Brownie Signature Date/Time: 08/13/2024/5:27:48 PM    Final    CT Chest W Contrast Result Date: 08/12/2024 CLINICAL DATA:  Chronic/persisting cough. Dyspnea. Pneumonia, complication suspected. EXAM: CT CHEST WITH CONTRAST TECHNIQUE: Multidetector CT imaging of the chest was performed during intravenous contrast administration. RADIATION DOSE REDUCTION: This exam was performed according to the departmental dose-optimization program which includes automated exposure control, adjustment of the mA and/or kV according to patient size and/or use of iterative reconstruction technique. CONTRAST:  70mL OMNIPAQUE  IOHEXOL  300 MG/ML  SOLN COMPARISON:  07/09/2024. FINDINGS: Cardiovascular: The heart is normal in size and there is no pericardial effusion. Three-vessel coronary artery calcifications are noted. There is atherosclerotic calcification of the aorta without evidence of aneurysm. The pulmonary trunk is normal in caliber. Mediastinum/Nodes: Prominent lymph nodes are present in the mediastinum and hilar regions bilaterally, measuring up to 1.3 cm in the precarinal space. No axillary lymphadenopathy. The thyroid  gland, trachea, and esophagus are within normal limits. Lungs/Pleura: Minimal apical pleural scarring is noted bilaterally. A few scattered scattered tree-in-bud nodular densities are present in the left lower lobe and right upper lobe. Bronchial wall thickening is noted bilaterally. There are few scattered ground-glass opacities, possibly representing residual infiltrate. No effusion or pneumothorax is seen. There is a stable 9 mm nodule in the right middle lobe, axial image 78. There is an irregular opacity measuring 1.5 cm with surrounding ground-glass and fiducial marker in the right upper lobe, axial image 46. There is an irregular opacity in the right middle lobe measuring 9 mm with adjacent fiducial marker, axial image 95. There is a 10 mm nodule with a fiducial marker in  the left upper lobe, axial image 75. There are nodular opacities in the left lower lobe measuring 1.2 cm, axial image 96, 9 mm axial image 95 and 6 mm axial image 97, not well seen on the previous exam due to infiltrates. Upper Abdomen: Fatty infiltration of the liver is noted. Cysts are noted in the left kidney. Surgical clips are present in the left upper quadrant. No acute abnormality. Musculoskeletal: Cervical spinal fusion hardware is noted. There are degenerative changes in the thoracic spine. No acute osseous abnormality. IMPRESSION: 1. Bronchial wall thickening with a few scattered ground-glass and tree-in-bud nodular densities, which may be infectious or inflammatory. 2. Bilateral pulmonary nodules, stable to increased in size from the prior exam. 3. Coronary artery calcifications. 4. Aortic atherosclerosis. 5. Hepatic steatosis. Electronically Signed  By: Leita Birmingham M.D.   On: 08/12/2024 16:06   DG Chest Port 1 View Result Date: 08/12/2024 CLINICAL DATA:  Cough.  Shortness of breath. EXAM: PORTABLE CHEST 1 VIEW COMPARISON:  07/09/2024 FINDINGS: The heart size and mediastinal contours are within normal limits. A few brachytherapy seeds are again seen in both lungs. Mild right upper lobe scarring is stable. The lungs are otherwise clear. Cervical spine fusion hardware again noted. IMPRESSION: No active disease. Electronically Signed   By: Norleen DELENA Kil M.D.   On: 08/12/2024 08:46   (Echo, Carotid, EGD, Colonoscopy, ERCP)    Subjective: Patient seen and examined on morning rounds.  She has some dry cough.  Without any wheezing or shortness of breath.  She was able to mobilize in the hallway without need for additional oxygen.  She is comfortable with plan to go home.   Discharge Exam: Vitals:   08/17/24 0444 08/17/24 0814  BP: (!) 166/87   Pulse: 62   Resp: 18   Temp: 98.3 F (36.8 C)   SpO2:  97%   Vitals:   08/16/24 2019 08/16/24 2203 08/17/24 0444 08/17/24 0814  BP:  (!) 165/79  (!) 166/87   Pulse:  72 62   Resp:  18 18   Temp:  (!) 97.5 F (36.4 C) 98.3 F (36.8 C)   TempSrc:  Oral Oral   SpO2: 96%   97%  Weight:        General: Pt is alert, awake, not in acute distress Cardiovascular: RRR, S1/S2 +, no rubs, no gallops Respiratory: CTA bilaterally, no wheezing, no rhonchi, occasional conducted upper airway sounds.  On room air. Abdominal: Soft, NT, ND, bowel sounds + Extremities: no edema, no cyanosis    The results of significant diagnostics from this hospitalization (including imaging, microbiology, ancillary and laboratory) are listed below for reference.     Microbiology: Recent Results (from the past 240 hours)  Blood culture (routine x 2)     Status: Abnormal   Collection Time: 08/12/24  5:55 PM   Specimen: BLOOD LEFT ARM  Result Value Ref Range Status   Specimen Description   Final    BLOOD LEFT ARM Performed at [facility], [address] [place], [place] [address]    Special Requests   Final    BOTTLES DRAWN AEROBIC AND ANAEROBIC Blood Culture adequate volume Performed at [facility], [address], [place], [place] [address]    Culture  Setup Time   Final    GRAM POSITIVE COCCI IN BOTH AEROBIC AND ANAEROBIC BOTTLES CRITICAL RESULT CALLED TO, READ BACK BY AND VERIFIED WITH: PHARMD ASABRA BOLOGNA [id number] @ 1557 FH    Culture (A)  Final    STAPHYLOCOCCUS EPIDERMIDIS THE SIGNIFICANCE OF ISOLATING THIS ORGANISM FROM A SINGLE SET OF BLOOD CULTURES WHEN MULTIPLE SETS ARE DRAWN IS UNCERTAIN. PLEASE NOTIFY THE MICROBIOLOGY DEPARTMENT WITHIN ONE WEEK IF SPECIATION AND SENSITIVITIES ARE REQUIRED. Performed at [facility], [address], [place], [place] [address]    Report Status 08/15/2024 FINAL  Final  Blood Culture ID Panel (Reflexed)     Status: Abnormal   Collection Time: 08/12/24  5:55 PM  Result Value Ref Range Status   Enterococcus faecalis NOT DETECTED NOT DETECTED Final   Enterococcus Faecium NOT  DETECTED NOT DETECTED Final   Listeria monocytogenes NOT DETECTED NOT DETECTED Final   Staphylococcus species DETECTED (A) NOT DETECTED Final    Comment: CRITICAL RESULT CALLED TO, READ  BACK BY AND VERIFIED WITH: PHARMD ASABRA BOLOGNA [id number] @ 1557 FH    Staphylococcus aureus (BCID) NOT DETECTED NOT DETECTED Final   Staphylococcus epidermidis DETECTED (A) NOT DETECTED Final    Comment: CRITICAL RESULT CALLED TO, READ BACK BY AND VERIFIED WITH: PHARMD ASABRA BOLOGNA [id number] @ 1557 FH    Staphylococcus lugdunensis NOT DETECTED NOT DETECTED Final   Streptococcus species NOT DETECTED NOT DETECTED Final   Streptococcus agalactiae NOT DETECTED NOT DETECTED Final   Streptococcus pneumoniae NOT DETECTED NOT DETECTED Final   Streptococcus pyogenes NOT DETECTED NOT DETECTED Final   A.calcoaceticus-baumannii NOT DETECTED NOT DETECTED Final   Bacteroides fragilis NOT DETECTED NOT DETECTED Final   Enterobacterales NOT DETECTED NOT DETECTED Final   Enterobacter cloacae complex NOT DETECTED NOT DETECTED Final   Escherichia coli NOT DETECTED NOT DETECTED Final   Klebsiella aerogenes NOT DETECTED NOT DETECTED Final   Klebsiella oxytoca NOT DETECTED NOT DETECTED Final   Klebsiella pneumoniae NOT DETECTED NOT DETECTED Final   Proteus species NOT DETECTED NOT DETECTED Final   Salmonella species NOT DETECTED NOT DETECTED Final   Serratia marcescens NOT DETECTED NOT DETECTED Final   Haemophilus influenzae NOT DETECTED NOT DETECTED Final   Neisseria meningitidis NOT DETECTED NOT DETECTED Final   Pseudomonas aeruginosa NOT DETECTED NOT DETECTED Final   Stenotrophomonas maltophilia NOT DETECTED NOT DETECTED Final   Candida albicans NOT DETECTED NOT DETECTED Final   Candida auris NOT DETECTED NOT DETECTED Final   Candida glabrata NOT DETECTED NOT DETECTED Final   Candida krusei NOT DETECTED NOT DETECTED Final   Candida parapsilosis NOT DETECTED NOT DETECTED Final   Candida tropicalis NOT DETECTED NOT DETECTED  Final   Cryptococcus neoformans/gattii NOT DETECTED NOT DETECTED Final   Methicillin resistance mecA/C NOT DETECTED NOT DETECTED Final    Comment: Performed at [facility], [address] [place], [place] [address]  Resp panel by RT-PCR (RSV, Flu A&B, Covid) Anterior Nasal Swab     Status: Abnormal   Collection Time: 08/12/24  7:03 PM   Specimen: Anterior Nasal Swab  Result Value Ref Range Status   SARS Coronavirus 2 by RT PCR NEGATIVE NEGATIVE Final    Comment: (NOTE) SARS-CoV-2 target nucleic acids are NOT DETECTED.  The SARS-CoV-2 RNA is generally detectable in upper respiratory specimens during the acute phase of infection. The lowest concentration of SARS-CoV-2 viral copies this assay can detect is 138 copies/mL. A negative result does not preclude SARS-Cov-2 infection and should not be used as the sole basis for treatment or other patient management decisions. A negative result may occur with  improper specimen collection/handling, submission of specimen other than nasopharyngeal swab, presence of viral mutation(s) within the areas targeted by this assay, and inadequate number of viral copies(<138 copies/mL). A negative result must be combined with clinical observations, patient history, and epidemiological information. The expected result is Negative.  Fact Sheet for Patients:  bloggercourse.com  Fact Sheet for Healthcare Providers:  seriousbroker.it  This test is no t yet approved or cleared by the [place]  FDA and  has been authorized for detection and/or diagnosis of SARS-CoV-2 by FDA under an Emergency Use Authorization (EUA). This EUA will remain  in effect (meaning this test can be used) for the duration of the COVID-19 declaration under Section 564(b)(1) of the Act, 21 U.S.C.section 360bbb-3(b)(1), unless the authorization is terminated  or revoked sooner.       Influenza A by PCR POSITIVE (A)  NEGATIVE Final   Influenza B  by PCR NEGATIVE NEGATIVE Final    Comment: (NOTE) The Xpert Xpress SARS-CoV-2/FLU/RSV plus assay is intended as an aid in the diagnosis of influenza from Nasopharyngeal swab specimens and should not be used as a sole basis for treatment. Nasal washings and aspirates are unacceptable for Xpert Xpress SARS-CoV-2/FLU/RSV testing.  Fact Sheet for Patients: bloggercourse.com  Fact Sheet for Healthcare Providers: seriousbroker.it  This test is not yet approved or cleared by the [place]  FDA and has been authorized for detection and/or diagnosis of SARS-CoV-2 by FDA under an Emergency Use Authorization (EUA). This EUA will remain in effect (meaning this test can be used) for the duration of the COVID-19 declaration under Section 564(b)(1) of the Act, 21 U.S.C. section 360bbb-3(b)(1), unless the authorization is terminated or revoked.     Resp Syncytial Virus by PCR NEGATIVE NEGATIVE Final    Comment: (NOTE) Fact Sheet for Patients: bloggercourse.com  Fact Sheet for Healthcare Providers: seriousbroker.it  This test is not yet approved or cleared by the [place]  FDA and has been authorized for detection and/or diagnosis of SARS-CoV-2 by FDA under an Emergency Use Authorization (EUA). This EUA will remain in effect (meaning this test can be used) for the duration of the COVID-19 declaration under Section 564(b)(1) of the Act, 21 U.S.C. section 360bbb-3(b)(1), unless the authorization is terminated or revoked.  Performed at [facility], [address] [place], [place] [address]   Respiratory (~20 pathogens) panel by PCR     Status: Abnormal   Collection Time: 08/12/24  7:03 PM   Specimen: Nasopharyngeal Swab; Respiratory  Result Value Ref Range Status   Adenovirus NOT DETECTED NOT DETECTED Final   Coronavirus 229E NOT  DETECTED NOT DETECTED Final    Comment: (NOTE) The Coronavirus on the Respiratory Panel, DOES NOT test for the novel  Coronavirus (2019 nCoV)    Coronavirus HKU1 NOT DETECTED NOT DETECTED Final   Coronavirus NL63 NOT DETECTED NOT DETECTED Final   Coronavirus OC43 NOT DETECTED NOT DETECTED Final   Metapneumovirus NOT DETECTED NOT DETECTED Final   Rhinovirus / Enterovirus NOT DETECTED NOT DETECTED Final   Influenza A H3 DETECTED (A) NOT DETECTED Final   Influenza B NOT DETECTED NOT DETECTED Final   Parainfluenza Virus 1 NOT DETECTED NOT DETECTED Final   Parainfluenza Virus 2 NOT DETECTED NOT DETECTED Final   Parainfluenza Virus 3 NOT DETECTED NOT DETECTED Final   Parainfluenza Virus 4 NOT DETECTED NOT DETECTED Final   Respiratory Syncytial Virus NOT DETECTED NOT DETECTED Final   Bordetella pertussis NOT DETECTED NOT DETECTED Final   Bordetella Parapertussis NOT DETECTED NOT DETECTED Final   Chlamydophila pneumoniae NOT DETECTED NOT DETECTED Final   Mycoplasma pneumoniae NOT DETECTED NOT DETECTED Final    Comment: Performed at [facility], [address], [place], [place] [address]  Blood culture (routine x 2)     Status: None (Preliminary result)   Collection Time: 08/13/24  2:58 PM   Specimen: BLOOD  Result Value Ref Range Status   Specimen Description   Final    BLOOD BLOOD RIGHT HAND Performed at [facility], [address] [place], [place] [address]    Special Requests   Final    BOTTLES DRAWN AEROBIC AND ANAEROBIC Blood Culture adequate volume Performed at [facility], [address], [place], [place] [address]    Culture   Final    NO GROWTH 4 DAYS Performed at [facility], [address], [place], [place] [address]    Report Status PENDING  Incomplete     Labs: BNP (last 3 results) No results for input(s): BNP in the last 8760 hours. Basic Metabolic Panel: Recent Labs  Lab 08/12/24 1221 08/14/24 0719  NA  135 136  K 4.1 4.9  CL 98 102  CO2 23 24  GLUCOSE 98 146*  BUN 18 23  CREATININE 0.95 0.82  CALCIUM  8.6* 8.8*  MG  --  2.7*  PHOS  --  2.8   Liver Function Tests: Recent Labs  Lab 08/12/24 1221 08/14/24 0719  AST 47* 33  ALT 24 22  ALKPHOS 61 48  BILITOT 0.5 0.3  PROT 9.4* 8.0  ALBUMIN 3.9 3.4*   No results for input(s): LIPASE, AMYLASE in the last 168 hours. No results for input(s): AMMONIA in the last 168 hours. CBC: Recent Labs  Lab 08/12/24 1221 08/14/24 0719  WBC 11.2* 5.8  HGB 15.5* 13.6  HCT 49.1* 41.2  MCV 100.2* 97.2  PLT 191 144*   Cardiac Enzymes: No results for input(s): CKTOTAL, CKMB, CKMBINDEX, TROPONINI in the last 168 hours. BNP: Invalid input(s): POCBNP CBG: Recent Labs  Lab 08/16/24 0749 08/16/24 1143 08/16/24 1715 08/16/24 2228 08/17/24 0725  GLUCAP 89 146* 240* 131* 90   D-Dimer No results for input(s): DDIMER in the last 72 hours. Hgb A1c No results for input(s): HGBA1C in the last 72 hours. Lipid Profile No results for input(s): CHOL, HDL, LDLCALC, TRIG, CHOLHDL, LDLDIRECT in the last 72 hours. Thyroid  function studies No results for input(s): TSH, T4TOTAL, T3FREE, THYROIDAB in the last 72 hours.  Invalid input(s): FREET3 Anemia work up No results for input(s): VITAMINB12, FOLATE, FERRITIN, TIBC, IRON, RETICCTPCT in the last 72 hours. Urinalysis    Component Value Date/Time   COLORURINE YELLOW 05/26/2023 0440   APPEARANCEUR CLEAR 05/26/2023 0440   LABSPEC 1.011 05/26/2023 0440   PHURINE 5.0 05/26/2023 0440   GLUCOSEU NEGATIVE 05/26/2023 0440   HGBUR NEGATIVE 05/26/2023 0440   BILIRUBINUR NEGATIVE 05/26/2023 0440   KETONESUR NEGATIVE 05/26/2023 0440   PROTEINUR NEGATIVE 05/26/2023 0440   UROBILINOGEN 0.2 06/11/2014 1651   NITRITE NEGATIVE 05/26/2023 0440   LEUKOCYTESUR NEGATIVE 05/26/2023 0440   Sepsis Labs Recent Labs  Lab 08/12/24 1221 08/14/24 0719  WBC  11.2* 5.8   Microbiology Recent Results (from the past 240 hours)  Blood culture (routine x 2)     Status: Abnormal   Collection Time: 08/12/24  5:55 PM   Specimen: BLOOD LEFT ARM  Result Value Ref Range Status   Specimen Description   Final    BLOOD LEFT ARM Performed at [facility], [address] [place], [place] [address]    Special Requests   Final    BOTTLES DRAWN AEROBIC AND ANAEROBIC Blood Culture adequate volume Performed at [facility], [address], [place], [place] [address]    Culture  Setup Time   Final    GRAM POSITIVE COCCI IN BOTH AEROBIC AND ANAEROBIC BOTTLES CRITICAL RESULT CALLED TO, READ BACK BY AND VERIFIED WITH: PHARMD ASABRA BOLOGNA [id number] @ 1557 FH    Culture (A)  Final    STAPHYLOCOCCUS EPIDERMIDIS THE SIGNIFICANCE OF ISOLATING THIS ORGANISM FROM A SINGLE SET OF BLOOD CULTURES WHEN MULTIPLE SETS ARE DRAWN IS UNCERTAIN. PLEASE NOTIFY THE MICROBIOLOGY DEPARTMENT WITHIN ONE WEEK IF SPECIATION AND SENSITIVITIES ARE REQUIRED. Performed at [facility], [address] [place], [place] [address]    Report Status 08/15/2024  FINAL  Final  Blood Culture ID Panel (Reflexed)     Status: Abnormal   Collection Time: 08/12/24  5:55 PM  Result Value Ref Range Status   Enterococcus faecalis NOT DETECTED NOT DETECTED Final   Enterococcus Faecium NOT DETECTED NOT DETECTED Final   Listeria monocytogenes NOT DETECTED NOT DETECTED Final   Staphylococcus species DETECTED (A) NOT DETECTED Final    Comment: CRITICAL RESULT CALLED TO, READ BACK BY AND VERIFIED WITH: PHARMD ASABRA BOLOGNA [id number] @ 1557 FH    Staphylococcus aureus (BCID) NOT DETECTED NOT DETECTED Final   Staphylococcus epidermidis DETECTED (A) NOT DETECTED Final    Comment: CRITICAL RESULT CALLED TO, READ BACK BY AND VERIFIED WITH: PHARMD ASABRA BOLOGNA [id number] @ 1557 FH    Staphylococcus lugdunensis NOT DETECTED NOT DETECTED Final   Streptococcus species NOT DETECTED NOT  DETECTED Final   Streptococcus agalactiae NOT DETECTED NOT DETECTED Final   Streptococcus pneumoniae NOT DETECTED NOT DETECTED Final   Streptococcus pyogenes NOT DETECTED NOT DETECTED Final   A.calcoaceticus-baumannii NOT DETECTED NOT DETECTED Final   Bacteroides fragilis NOT DETECTED NOT DETECTED Final   Enterobacterales NOT DETECTED NOT DETECTED Final   Enterobacter cloacae complex NOT DETECTED NOT DETECTED Final   Escherichia coli NOT DETECTED NOT DETECTED Final   Klebsiella aerogenes NOT DETECTED NOT DETECTED Final   Klebsiella oxytoca NOT DETECTED NOT DETECTED Final   Klebsiella pneumoniae NOT DETECTED NOT DETECTED Final   Proteus species NOT DETECTED NOT DETECTED Final   Salmonella species NOT DETECTED NOT DETECTED Final   Serratia marcescens NOT DETECTED NOT DETECTED Final   Haemophilus influenzae NOT DETECTED NOT DETECTED Final   Neisseria meningitidis NOT DETECTED NOT DETECTED Final   Pseudomonas aeruginosa NOT DETECTED NOT DETECTED Final   Stenotrophomonas maltophilia NOT DETECTED NOT DETECTED Final   Candida albicans NOT DETECTED NOT DETECTED Final   Candida auris NOT DETECTED NOT DETECTED Final   Candida glabrata NOT DETECTED NOT DETECTED Final   Candida krusei NOT DETECTED NOT DETECTED Final   Candida parapsilosis NOT DETECTED NOT DETECTED Final   Candida tropicalis NOT DETECTED NOT DETECTED Final   Cryptococcus neoformans/gattii NOT DETECTED NOT DETECTED Final   Methicillin resistance mecA/C NOT DETECTED NOT DETECTED Final    Comment: Performed at [facility], [address] [place], [place] [address]  Resp panel by RT-PCR (RSV, Flu A&B, Covid) Anterior Nasal Swab     Status: Abnormal   Collection Time: 08/12/24  7:03 PM   Specimen: Anterior Nasal Swab  Result Value Ref Range Status   SARS Coronavirus 2 by RT PCR NEGATIVE NEGATIVE Final    Comment: (NOTE) SARS-CoV-2 target nucleic acids are NOT DETECTED.  The SARS-CoV-2 RNA is generally detectable in upper  respiratory specimens during the acute phase of infection. The lowest concentration of SARS-CoV-2 viral copies this assay can detect is 138 copies/mL. A negative result does not preclude SARS-Cov-2 infection and should not be used as the sole basis for treatment or other patient management decisions. A negative result may occur with  improper specimen collection/handling, submission of specimen other than nasopharyngeal swab, presence of viral mutation(s) within the areas targeted by this assay, and inadequate number of viral copies(<138 copies/mL). A negative result must be combined with clinical observations, patient history, and epidemiological information. The expected result is Negative.  Fact Sheet for Patients:  bloggercourse.com  Fact Sheet for Healthcare Providers:  seriousbroker.it  This test is no t yet approved or cleared by the [place]  FDA and  has been authorized for detection and/or diagnosis of SARS-CoV-2 by FDA under an Emergency Use Authorization (EUA). This EUA will remain  in effect (meaning this test can be used) for the duration of the COVID-19 declaration under Section 564(b)(1) of the Act, 21 U.S.C.section 360bbb-3(b)(1), unless the authorization is terminated  or revoked sooner.       Influenza A by PCR POSITIVE (A) NEGATIVE Final   Influenza B by PCR NEGATIVE NEGATIVE Final    Comment: (NOTE) The Xpert Xpress SARS-CoV-2/FLU/RSV plus assay is intended as an aid in the diagnosis of influenza from Nasopharyngeal swab specimens and should not be used as a sole basis for treatment. Nasal washings and aspirates are unacceptable for Xpert Xpress SARS-CoV-2/FLU/RSV testing.  Fact Sheet for Patients: bloggercourse.com  Fact Sheet for Healthcare Providers: seriousbroker.it  This test is not yet approved or cleared by the [place]  FDA and has been  authorized for detection and/or diagnosis of SARS-CoV-2 by FDA under an Emergency Use Authorization (EUA). This EUA will remain in effect (meaning this test can be used) for the duration of the COVID-19 declaration under Section 564(b)(1) of the Act, 21 U.S.C. section 360bbb-3(b)(1), unless the authorization is terminated or revoked.     Resp Syncytial Virus by PCR NEGATIVE NEGATIVE Final    Comment: (NOTE) Fact Sheet for Patients: bloggercourse.com  Fact Sheet for Healthcare Providers: seriousbroker.it  This test is not yet approved or cleared by the [place]  FDA and has been authorized for detection and/or diagnosis of SARS-CoV-2 by FDA under an Emergency Use Authorization (EUA). This EUA will remain in effect (meaning this test can be used) for the duration of the COVID-19 declaration under Section 564(b)(1) of the Act, 21 U.S.C. section 360bbb-3(b)(1), unless the authorization is terminated or revoked.  Performed at [facility], [address] [place], [place] [address]   Respiratory (~20 pathogens) panel by PCR     Status: Abnormal   Collection Time: 08/12/24  7:03 PM   Specimen: Nasopharyngeal Swab; Respiratory  Result Value Ref Range Status   Adenovirus NOT DETECTED NOT DETECTED Final   Coronavirus 229E NOT DETECTED NOT DETECTED Final    Comment: (NOTE) The Coronavirus on the Respiratory Panel, DOES NOT test for the novel  Coronavirus (2019 nCoV)    Coronavirus HKU1 NOT DETECTED NOT DETECTED Final   Coronavirus NL63 NOT DETECTED NOT DETECTED Final   Coronavirus OC43 NOT DETECTED NOT DETECTED Final   Metapneumovirus NOT DETECTED NOT DETECTED Final   Rhinovirus / Enterovirus NOT DETECTED NOT DETECTED Final   Influenza A H3 DETECTED (A) NOT DETECTED Final   Influenza B NOT DETECTED NOT DETECTED Final   Parainfluenza Virus 1 NOT DETECTED NOT DETECTED Final   Parainfluenza Virus 2 NOT DETECTED  NOT DETECTED Final   Parainfluenza Virus 3 NOT DETECTED NOT DETECTED Final   Parainfluenza Virus 4 NOT DETECTED NOT DETECTED Final   Respiratory Syncytial Virus NOT DETECTED NOT DETECTED Final   Bordetella pertussis NOT DETECTED NOT DETECTED Final   Bordetella Parapertussis NOT DETECTED NOT DETECTED Final   Chlamydophila pneumoniae NOT DETECTED NOT DETECTED Final   Mycoplasma pneumoniae NOT DETECTED NOT DETECTED Final    Comment: Performed at [facility], [address], [place], [place] [address]  Blood culture (routine x 2)     Status: None (Preliminary result)   Collection Time: 08/13/24  2:58 PM   Specimen: BLOOD  Result Value Ref Range Status   Specimen Description   Final  BLOOD BLOOD RIGHT HAND Performed at [facility], [address] [place], [place] [address]    Special Requests   Final    BOTTLES DRAWN AEROBIC AND ANAEROBIC Blood Culture adequate volume Performed at [facility], [address], [place], [place] [address]    Culture   Final    NO GROWTH 4 DAYS Performed at [facility], [address] [place], [place] [address]    Report Status PENDING  Incomplete     Time coordinating discharge: 35 minutes  SIGNED:   Renato Applebaum, MD  [facility] 08/17/2024, 10:20 AM     [1]  Allergies Allergen Reactions   Lyrica [Pregabalin] Other (See Comments)    Made symptoms worse   Simvastatin  Other (See Comments)    Myalgias   Statins Other (See Comments)    Myalgias   Meloxicam Other (See Comments)    Makes her sweat and muscle cramps

## 2024-08-17 NOTE — Plan of Care (Signed)
" °  Problem: Education: Goal: Knowledge of General Education information will improve Description: Including pain rating scale, medication(s)/side effects and non-pharmacologic comfort measures Outcome: Progressing   Problem: Health Behavior/Discharge Planning: Goal: Ability to manage health-related needs will improve Outcome: Progressing   Problem: Clinical Measurements: Goal: Will remain free from infection Outcome: Progressing Goal: Diagnostic test results will improve Outcome: Progressing Goal: Respiratory complications will improve Outcome: Progressing   Problem: Activity: Goal: Risk for activity intolerance will decrease Outcome: Progressing   Problem: Nutrition: Goal: Adequate nutrition will be maintained Outcome: Progressing   Problem: Pain Managment: Goal: General experience of comfort will improve and/or be controlled Outcome: Progressing   "

## 2024-08-18 LAB — CULTURE, BLOOD (ROUTINE X 2)
Culture: NO GROWTH
Special Requests: ADEQUATE

## 2024-10-19 ENCOUNTER — Other Ambulatory Visit

## 2024-10-24 ENCOUNTER — Ambulatory Visit: Admitting: Internal Medicine
